# Patient Record
Sex: Female | Born: 1955
Health system: Southern US, Community
[De-identification: ages and names within clinical notes are randomized; demographics above are authoritative.]

## PROBLEM LIST (undated history)

## (undated) DIAGNOSIS — K449 Diaphragmatic hernia without obstruction or gangrene: Secondary | ICD-10-CM

## (undated) DIAGNOSIS — M810 Age-related osteoporosis without current pathological fracture: Secondary | ICD-10-CM

## (undated) DIAGNOSIS — F419 Anxiety disorder, unspecified: Secondary | ICD-10-CM

## (undated) DIAGNOSIS — I341 Nonrheumatic mitral (valve) prolapse: Secondary | ICD-10-CM

## (undated) DIAGNOSIS — K219 Gastro-esophageal reflux disease without esophagitis: Secondary | ICD-10-CM

## (undated) DIAGNOSIS — J45909 Unspecified asthma, uncomplicated: Secondary | ICD-10-CM

## (undated) DIAGNOSIS — E041 Nontoxic single thyroid nodule: Secondary | ICD-10-CM

## (undated) DIAGNOSIS — E785 Hyperlipidemia, unspecified: Secondary | ICD-10-CM

## (undated) DIAGNOSIS — R87619 Unspecified abnormal cytological findings in specimens from cervix uteri: Secondary | ICD-10-CM

## (undated) DIAGNOSIS — K5792 Diverticulitis of intestine, part unspecified, without perforation or abscess without bleeding: Secondary | ICD-10-CM

## (undated) DIAGNOSIS — A6009 Herpesviral infection of other urogenital tract: Secondary | ICD-10-CM

## (undated) DIAGNOSIS — J40 Bronchitis, not specified as acute or chronic: Secondary | ICD-10-CM

## (undated) DIAGNOSIS — L509 Urticaria, unspecified: Secondary | ICD-10-CM

## (undated) HISTORY — DX: Unspecified asthma, uncomplicated: J45.909

## (undated) HISTORY — DX: Diverticulitis of intestine, part unspecified, without perforation or abscess without bleeding: K57.92

## (undated) HISTORY — DX: Herpesviral infection of other urogenital tract: A60.09

## (undated) HISTORY — DX: Anxiety disorder, unspecified: F41.9

## (undated) HISTORY — DX: Nonrheumatic mitral (valve) prolapse: I34.1

## (undated) HISTORY — DX: Diaphragmatic hernia without obstruction or gangrene: K44.9

## (undated) HISTORY — DX: Gastro-esophageal reflux disease without esophagitis: K21.9

## (undated) HISTORY — DX: Bronchitis, not specified as acute or chronic: J40

## (undated) HISTORY — DX: Nontoxic single thyroid nodule: E04.1

## (undated) HISTORY — DX: Hyperlipidemia, unspecified: E78.5

## (undated) HISTORY — DX: Unspecified abnormal cytological findings in specimens from cervix uteri: R87.619

## (undated) HISTORY — DX: Age-related osteoporosis without current pathological fracture: M81.0

## (undated) HISTORY — DX: Urticaria, unspecified: L50.9

---

## 2001-04-28 ENCOUNTER — Other Ambulatory Visit: Admission: RE | Admit: 2001-04-28 | Discharge: 2001-04-28 | Payer: Self-pay | Admitting: Obstetrics and Gynecology

## 2001-06-23 ENCOUNTER — Other Ambulatory Visit: Admission: RE | Admit: 2001-06-23 | Discharge: 2001-06-23 | Payer: Self-pay | Admitting: Dermatology

## 2002-06-11 ENCOUNTER — Other Ambulatory Visit: Admission: RE | Admit: 2002-06-11 | Discharge: 2002-06-11 | Payer: Self-pay | Admitting: Obstetrics and Gynecology

## 2003-08-25 ENCOUNTER — Other Ambulatory Visit: Admission: RE | Admit: 2003-08-25 | Discharge: 2003-08-25 | Payer: Self-pay | Admitting: Obstetrics and Gynecology

## 2004-06-25 HISTORY — PX: CLOSED MANIPULATION SHOULDER: SUR205

## 2004-06-25 LAB — HM DEXA SCAN

## 2004-11-23 HISTORY — PX: LEEP: SHX91

## 2004-12-30 HISTORY — PX: CLOSED MANIPULATION SHOULDER: SUR205

## 2005-06-20 ENCOUNTER — Other Ambulatory Visit: Admission: RE | Admit: 2005-06-20 | Discharge: 2005-06-20 | Payer: Self-pay | Admitting: Obstetrics and Gynecology

## 2008-07-15 ENCOUNTER — Emergency Department (HOSPITAL_BASED_OUTPATIENT_CLINIC_OR_DEPARTMENT_OTHER): Admission: EM | Admit: 2008-07-15 | Discharge: 2008-07-15 | Payer: Self-pay | Admitting: Emergency Medicine

## 2008-07-15 ENCOUNTER — Ambulatory Visit: Payer: Self-pay | Admitting: Diagnostic Radiology

## 2008-10-17 ENCOUNTER — Ambulatory Visit: Payer: Self-pay | Admitting: Diagnostic Radiology

## 2008-10-17 ENCOUNTER — Emergency Department (HOSPITAL_BASED_OUTPATIENT_CLINIC_OR_DEPARTMENT_OTHER): Admission: EM | Admit: 2008-10-17 | Discharge: 2008-10-17 | Payer: Self-pay | Admitting: Emergency Medicine

## 2008-10-29 ENCOUNTER — Ambulatory Visit: Payer: Self-pay | Admitting: Diagnostic Radiology

## 2008-10-29 ENCOUNTER — Emergency Department (HOSPITAL_BASED_OUTPATIENT_CLINIC_OR_DEPARTMENT_OTHER): Admission: EM | Admit: 2008-10-29 | Discharge: 2008-10-29 | Payer: Self-pay | Admitting: Emergency Medicine

## 2009-08-19 ENCOUNTER — Emergency Department (HOSPITAL_BASED_OUTPATIENT_CLINIC_OR_DEPARTMENT_OTHER): Admission: EM | Admit: 2009-08-19 | Discharge: 2009-08-19 | Payer: Self-pay | Admitting: Emergency Medicine

## 2009-08-19 ENCOUNTER — Ambulatory Visit: Payer: Self-pay | Admitting: Diagnostic Radiology

## 2009-10-19 ENCOUNTER — Ambulatory Visit: Payer: Self-pay | Admitting: Obstetrics & Gynecology

## 2009-11-11 ENCOUNTER — Other Ambulatory Visit: Admission: RE | Admit: 2009-11-11 | Discharge: 2009-11-11 | Payer: Self-pay | Admitting: Obstetrics & Gynecology

## 2009-11-11 ENCOUNTER — Ambulatory Visit: Payer: Self-pay | Admitting: Obstetrics & Gynecology

## 2009-11-23 LAB — HM PAP SMEAR: HM Pap smear: ABNORMAL

## 2009-12-02 ENCOUNTER — Ambulatory Visit: Payer: Self-pay | Admitting: Obstetrics & Gynecology

## 2010-03-22 ENCOUNTER — Ambulatory Visit (HOSPITAL_COMMUNITY)
Admission: RE | Admit: 2010-03-22 | Discharge: 2010-03-22 | Payer: Self-pay | Source: Home / Self Care | Admitting: Obstetrics & Gynecology

## 2010-06-02 ENCOUNTER — Ambulatory Visit: Payer: Self-pay | Admitting: Obstetrics & Gynecology

## 2010-06-28 ENCOUNTER — Ambulatory Visit: Admit: 2010-06-28 | Payer: Self-pay | Admitting: Obstetrics & Gynecology

## 2010-07-05 ENCOUNTER — Ambulatory Visit: Admit: 2010-07-05 | Payer: Self-pay | Admitting: Obstetrics & Gynecology

## 2010-07-16 ENCOUNTER — Encounter: Payer: Self-pay | Admitting: *Deleted

## 2010-07-16 ENCOUNTER — Encounter: Payer: Self-pay | Admitting: Obstetrics and Gynecology

## 2010-09-11 LAB — POCT URINALYSIS DIP (DEVICE)
Bilirubin Urine: NEGATIVE
Glucose, UA: NEGATIVE mg/dL
Hgb urine dipstick: NEGATIVE
Ketones, ur: NEGATIVE mg/dL
Nitrite: NEGATIVE
Urobilinogen, UA: 0.2 mg/dL (ref 0.0–1.0)
pH: 5 (ref 5.0–8.0)

## 2010-09-27 ENCOUNTER — Encounter: Payer: Self-pay | Admitting: Family

## 2010-09-27 ENCOUNTER — Ambulatory Visit (INDEPENDENT_AMBULATORY_CARE_PROVIDER_SITE_OTHER): Payer: Self-pay | Admitting: Family

## 2010-09-27 VITALS — BP 100/70 | HR 78 | Temp 98.0°F | Resp 16 | Ht 66.0 in | Wt 120.0 lb

## 2010-09-27 DIAGNOSIS — K219 Gastro-esophageal reflux disease without esophagitis: Secondary | ICD-10-CM

## 2010-09-27 DIAGNOSIS — Z0000 Encounter for general adult medical examination without abnormal findings: Secondary | ICD-10-CM

## 2010-09-27 DIAGNOSIS — J329 Chronic sinusitis, unspecified: Secondary | ICD-10-CM | POA: Insufficient documentation

## 2010-09-27 DIAGNOSIS — F329 Major depressive disorder, single episode, unspecified: Secondary | ICD-10-CM

## 2010-09-27 MED ORDER — CITALOPRAM HYDROBROMIDE 20 MG PO TABS: 20.0000 mg | ORAL_TABLET | Freq: Every day | ORAL | Status: DC

## 2010-09-27 MED ORDER — AMOXICILLIN 875 MG PO TABS: 875.0000 mg | ORAL_TABLET | Freq: Two times a day (BID) | ORAL | Status: AC

## 2010-09-27 MED ORDER — NEOMYCIN-POLYMYXIN-HC 3.5-10000-1 OT SUSP: 3.0000 [drp] | Freq: Three times a day (TID) | OTIC | Status: AC

## 2010-09-27 NOTE — Assessment & Plan Note (Signed)
Will treat with round of amoxicillin.

## 2010-09-27 NOTE — Assessment & Plan Note (Signed)
Suspect that her GI complaints and hoarseness are secondary to GERD.  Will give trial of Prilosec OTC.
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
## 2010-09-27 NOTE — Patient Instructions (Signed)
Take citalopram 1/2 tablet by mouth once daily for the first week.  Increase to a full tablet once daily on week two.   Please complete your blood work tomorrow fasting on the first floor.  Welcome to [facility]!

## 2010-09-27 NOTE — Assessment & Plan Note (Signed)
Pt will be given trial of citalopram.  She was counseled to call if symptoms of worsening depression/suicide ideation occur while she is taking this medication.

## 2010-09-28 LAB — CBC WITH DIFFERENTIAL/PLATELET
Basophils Absolute: 0 10*3/uL (ref 0.0–0.1)
Lymphocytes Relative: 29 % (ref 12–46)
MCV: 90.5 fL (ref 78.0–100.0)
Monocytes Absolute: 0.4 10*3/uL (ref 0.1–1.0)
Neutro Abs: 2.3 10*3/uL (ref 1.7–7.7)
Platelets: 347 10*3/uL (ref 150–400)
RBC: 4.19 MIL/uL (ref 3.87–5.11)
RDW: 12.9 % (ref 11.5–15.5)

## 2010-09-28 LAB — BASIC METABOLIC PANEL
Calcium: 10.1 mg/dL (ref 8.4–10.5)
Glucose, Bld: 84 mg/dL (ref 70–99)
Potassium: 4.7 mEq/L (ref 3.5–5.3)
Sodium: 138 mEq/L (ref 135–145)

## 2010-09-28 LAB — HEPATIC FUNCTION PANEL
ALT: 14 U/L (ref 0–35)
AST: 22 U/L (ref 0–37)
Albumin: 4.9 g/dL (ref 3.5–5.2)
Bilirubin, Direct: 0.1 mg/dL (ref 0.0–0.3)
Total Bilirubin: 0.5 mg/dL (ref 0.3–1.2)

## 2010-10-01 ENCOUNTER — Encounter: Payer: Self-pay | Admitting: Family

## 2010-10-03 LAB — COMPREHENSIVE METABOLIC PANEL
Albumin: 4.4 g/dL (ref 3.5–5.2)
Alkaline Phosphatase: 39 U/L (ref 39–117)
BUN: 18 mg/dL (ref 6–23)
Calcium: 9.7 mg/dL (ref 8.4–10.5)
Chloride: 104 mEq/L (ref 96–112)
Sodium: 142 mEq/L (ref 135–145)
Total Protein: 7.4 g/dL (ref 6.0–8.3)

## 2010-10-03 LAB — URINALYSIS, ROUTINE W REFLEX MICROSCOPIC
Bilirubin Urine: NEGATIVE
Glucose, UA: NEGATIVE mg/dL
Ketones, ur: NEGATIVE mg/dL
Nitrite: NEGATIVE
Urobilinogen, UA: 0.2 mg/dL (ref 0.0–1.0)

## 2010-10-03 LAB — DIFFERENTIAL
Basophils Absolute: 0 10*3/uL (ref 0.0–0.1)
Basophils Relative: 1 % (ref 0–1)
Lymphs Abs: 1.2 10*3/uL (ref 0.7–4.0)
Monocytes Relative: 8 % (ref 3–12)

## 2010-10-03 LAB — CBC
MCV: 92.3 fL (ref 78.0–100.0)
WBC: 5.3 10*3/uL (ref 4.0–10.5)

## 2010-10-04 ENCOUNTER — Telehealth: Payer: Self-pay | Admitting: *Deleted

## 2010-10-04 LAB — URINALYSIS, ROUTINE W REFLEX MICROSCOPIC
Glucose, UA: NEGATIVE mg/dL
Hgb urine dipstick: NEGATIVE
Ketones, ur: NEGATIVE mg/dL
Urobilinogen, UA: 0.2 mg/dL (ref 0.0–1.0)

## 2010-10-04 LAB — PREGNANCY, URINE: Preg Test, Ur: NEGATIVE

## 2010-10-04 LAB — WET PREP, GENITAL: Trich, Wet Prep: NONE SEEN

## 2010-10-04 NOTE — Telephone Encounter (Signed)
Received message from pt stating she would like to schedule a pap smear with [place]. Pt states that her last pap smear was abnormal. Advised pt, per Melissa's recommendation that she should continue pap smears with a GYN until she has a normal pap smear then she may have them done at our office. Pt voices understanding. Pt requested most recent lab results. Gave pt verbal result per lab letter that was mailed on 10/02/10. Advised pt to let me know if she doesn't receive them in the mail by Monday. Advised pt that the lipid panel was not run by the lab. Pt states she will return to the lab fasting tomorrow morning. Order reprinted and faxed to the lab.

## 2010-10-05 ENCOUNTER — Telehealth: Payer: Self-pay | Admitting: *Deleted

## 2010-10-05 ENCOUNTER — Other Ambulatory Visit: Payer: Self-pay | Admitting: Family

## 2010-10-05 NOTE — Telephone Encounter (Signed)
Received call from pt stating that her address has changed to [address] [place], [place] [address] and requested that we resend her lab results to the new address. Pt just had cholesterol drawn today. Will resend previous results letter once the lipid results are final.

## 2010-10-06 ENCOUNTER — Encounter: Payer: Self-pay | Admitting: Family

## 2010-10-06 NOTE — Telephone Encounter (Signed)
Cholesterol results received, letters printed and mailed to pt's new address.

## 2010-10-11 ENCOUNTER — Ambulatory Visit: Payer: Self-pay | Admitting: Family Medicine

## 2010-10-11 ENCOUNTER — Ambulatory Visit: Payer: Self-pay | Admitting: Obstetrics and Gynecology

## 2010-10-11 ENCOUNTER — Other Ambulatory Visit: Payer: Self-pay | Admitting: Family Medicine

## 2010-10-11 DIAGNOSIS — R87612 Low grade squamous intraepithelial lesion on cytologic smear of cervix (LGSIL): Secondary | ICD-10-CM

## 2010-10-11 DIAGNOSIS — A6000 Herpesviral infection of urogenital system, unspecified: Secondary | ICD-10-CM

## 2010-10-12 NOTE — Group Therapy Note (Signed)
NAME:  Kellie [name], ABRAMS NO.:  [id number]  MEDICAL RECORD NO.:  [id number]           PATIENT TYPE:  A  LOCATION:  [facility]                   FACILITY:  [facility]  PHYSICIAN:  Maylon Cos, CNM    DATE OF BIRTH:  06-11-1956  DATE OF SERVICE:  10/11/2010                                 CLINIC NOTE  The patient is being seen in GYN Clinic at [facility].  Reason for today's visit is repeat Pap smear.  HISTORY OF PRESENT ILLNESS:  The patient is a 55 year old Caucasian female who presents for her first Pap smear following a positive __________ for CIN 1 in February 2011 with positive ECC.  She presented today with no complaints other than recurrence of known genital herpes, and she requests a refill on her acyclovir.  PHYSICAL EXAMINATION:  GENERAL:  Aaralynn is a pleasant 55 year old Caucasian female in no apparent distress. HEENT:  Grossly normal. VITAL SIGNS:  Stable.  Her blood pressure is 125/68.  Her weight is 120. Her height is 66-1/2 inches. ABDOMEN:  Soft and nontender. GENITALIA:.  Mucous membranes are atrophied.  Introitus is pale and smooth.  Cervix is parous, nonfriable.  Bimanual exam reveals nonenlarged and nontender uterus, nonenlarged and nontender adnexa. Ovaries are nonpalpable.  ASSESSMENT: 1. Repeat Pap following loop electrosurgical excision procedure for     abnormal Pap smear cervical intraepithelial neoplasia 1.  Plan:     The patient should follow up in 6 months for repeat Pap smear and     annual exam. 2. Known genital herpes.  Plan:  Prescription given for acyclovir 400     mg one p.o. t.i.d. x5 days for recurrence, p.r.n. refills are     given.          ______________________________ Maylon Cos, CNM    SS/MEDQ  D:  10/11/2010  T:  10/12/2010  Job:  [id number]

## 2010-11-08 ENCOUNTER — Ambulatory Visit (INDEPENDENT_AMBULATORY_CARE_PROVIDER_SITE_OTHER): Payer: Self-pay | Admitting: Family

## 2010-11-08 ENCOUNTER — Telehealth: Payer: Self-pay | Admitting: *Deleted

## 2010-11-08 ENCOUNTER — Encounter: Payer: Self-pay | Admitting: Family

## 2010-11-08 ENCOUNTER — Telehealth: Payer: Self-pay | Admitting: Family

## 2010-11-08 ENCOUNTER — Ambulatory Visit (HOSPITAL_BASED_OUTPATIENT_CLINIC_OR_DEPARTMENT_OTHER)
Admission: RE | Admit: 2010-11-08 | Discharge: 2010-11-08 | Disposition: A | Discharge status: Home / Self Care | Payer: Self-pay | Source: Ambulatory Visit | Attending: Family | Admitting: Family

## 2010-11-08 VITALS — BP 106/70 | HR 66 | Temp 98.1°F | Resp 16 | Ht 66.0 in | Wt 118.1 lb

## 2010-11-08 DIAGNOSIS — R110 Nausea: Secondary | ICD-10-CM

## 2010-11-08 DIAGNOSIS — K219 Gastro-esophageal reflux disease without esophagitis: Secondary | ICD-10-CM

## 2010-11-08 DIAGNOSIS — F329 Major depressive disorder, single episode, unspecified: Secondary | ICD-10-CM

## 2010-11-08 DIAGNOSIS — G4700 Insomnia, unspecified: Secondary | ICD-10-CM | POA: Insufficient documentation

## 2010-11-08 DIAGNOSIS — J309 Allergic rhinitis, unspecified: Secondary | ICD-10-CM

## 2010-11-08 DIAGNOSIS — R0789 Other chest pain: Secondary | ICD-10-CM | POA: Insufficient documentation

## 2010-11-08 MED ORDER — ALPRAZOLAM 0.5 MG PO TABS: ORAL_TABLET | ORAL | Status: DC

## 2010-11-08 NOTE — Assessment & Plan Note (Signed)
Trial of prn alprazolam at bedtime.

## 2010-11-08 NOTE — Telephone Encounter (Signed)
Pls call patient and let her know that her ultrasound in normal.  No gallstones.  I would like to refer her to GI for further evaluation of her nausea.

## 2010-11-08 NOTE — Assessment & Plan Note (Signed)
She reports chronic nausea and 1-2 x a week some mild dysphagia.  Will check abdominal ultrasound.  I negative, will refer to GI for further evaluation.
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
## 2010-11-08 NOTE — Telephone Encounter (Signed)
Received message from pt stating she was supposed to have gotten a letter from [place] today at her visit confirming that she has asthma and should avoid smoke. Pt requests that we mail it to her ASAP. Please advise.

## 2010-11-08 NOTE — Assessment & Plan Note (Signed)
Stable, continue citalopram.  

## 2010-11-08 NOTE — Patient Instructions (Signed)
Please complete your ultrasound on the first floor. Start claritin. Follow up in 1 month.

## 2010-11-08 NOTE — Assessment & Plan Note (Signed)
Will add claritin.

## 2010-11-09 ENCOUNTER — Telehealth: Payer: Self-pay | Admitting: *Deleted

## 2010-11-09 NOTE — Telephone Encounter (Signed)
Pt called stating she was contacted by our office re: GI referral and is wanting to wait until she hears from the results of yesterday's tests. Also asking about status of letter re: smoke avoidance. Letter was mailed to her today. Please advise re: test results.

## 2010-11-10 NOTE — Telephone Encounter (Signed)
Pt notified and given appt date / time for GI consultation. She states that she has an appointment conflict for 11/13/10. Office number was given to pt to call and reschedule appt.

## 2010-11-10 NOTE — Telephone Encounter (Signed)
Please see 5/16 phone note.

## 2010-11-10 NOTE — Telephone Encounter (Signed)
Pt advised letter has been mailed.

## 2010-11-13 ENCOUNTER — Ambulatory Visit: Payer: Self-pay | Admitting: Gastroenterology

## 2010-12-04 ENCOUNTER — Telehealth: Payer: Self-pay | Admitting: Family

## 2010-12-04 ENCOUNTER — Ambulatory Visit (INDEPENDENT_AMBULATORY_CARE_PROVIDER_SITE_OTHER): Payer: Self-pay | Admitting: Family

## 2010-12-04 ENCOUNTER — Encounter: Payer: Self-pay | Admitting: Family

## 2010-12-04 ENCOUNTER — Ambulatory Visit (HOSPITAL_BASED_OUTPATIENT_CLINIC_OR_DEPARTMENT_OTHER)
Admission: RE | Admit: 2010-12-04 | Discharge: 2010-12-04 | Disposition: A | Discharge status: Home / Self Care | Payer: Self-pay | Source: Ambulatory Visit | Attending: Family | Admitting: Family

## 2010-12-04 VITALS — BP 138/70 | HR 78 | Temp 98.0°F | Resp 16 | Ht 66.0 in | Wt 118.1 lb

## 2010-12-04 DIAGNOSIS — R059 Cough, unspecified: Secondary | ICD-10-CM | POA: Insufficient documentation

## 2010-12-04 DIAGNOSIS — R05 Cough: Secondary | ICD-10-CM

## 2010-12-04 DIAGNOSIS — J40 Bronchitis, not specified as acute or chronic: Secondary | ICD-10-CM

## 2010-12-04 DIAGNOSIS — R0989 Other specified symptoms and signs involving the circulatory and respiratory systems: Secondary | ICD-10-CM | POA: Insufficient documentation

## 2010-12-04 MED ORDER — MOMETASONE FUROATE 220 MCG/INH IN AEPB: 1.0000 | INHALATION_SPRAY | Freq: Every day | RESPIRATORY_TRACT | Status: DC

## 2010-12-04 MED ORDER — DOXYCYCLINE HYCLATE 100 MG PO CAPS: 100.0000 mg | ORAL_CAPSULE | Freq: Two times a day (BID) | ORAL | Status: DC

## 2010-12-04 MED ORDER — DOXYCYCLINE HYCLATE 100 MG PO CAPS: 100.0000 mg | ORAL_CAPSULE | Freq: Two times a day (BID) | ORAL | Status: AC

## 2010-12-04 NOTE — Telephone Encounter (Signed)
Please call patient and let her know that her chest xray is clear. No pneumonia.

## 2010-12-04 NOTE — Assessment & Plan Note (Signed)
Pt is requesting imaging of her chest.  Will check baseline chest x-ray to exclude Pneumonia.   Plan to treat with doxycycline for bronchitis. Samples of Asmanex provided to patient for use over the next few weeks to help with mild bronchospasm.  Also recommended that she continue to use her pro-air PRN.  Pt is instructed to call if her symptoms worsen, if they do not improve.  Follow up in 1-2 weeks.
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
## 2010-12-04 NOTE — Patient Instructions (Addendum)
Please complete your chest x-ray on the first floor. [place] asmanex for the next 2 weeks, or until your symptoms are resolved. Follow up in 1-2 weeks.

## 2010-12-05 NOTE — Telephone Encounter (Signed)
Pt.notified

## 2011-05-31 ENCOUNTER — Ambulatory Visit (INDEPENDENT_AMBULATORY_CARE_PROVIDER_SITE_OTHER): Payer: Self-pay | Admitting: Internal Medicine

## 2011-05-31 ENCOUNTER — Encounter: Payer: Self-pay | Admitting: Internal Medicine

## 2011-05-31 VITALS — BP 96/60 | HR 86 | Temp 98.3°F | Resp 18 | Wt 125.0 lb

## 2011-05-31 DIAGNOSIS — R0989 Other specified symptoms and signs involving the circulatory and respiratory systems: Secondary | ICD-10-CM

## 2011-05-31 MED ORDER — ALBUTEROL SULFATE HFA 108 (90 BASE) MCG/ACT IN AERS: 2.0000 | INHALATION_SPRAY | Freq: Four times a day (QID) | RESPIRATORY_TRACT | Status: DC | PRN

## 2011-06-02 DIAGNOSIS — R053 Chronic cough: Secondary | ICD-10-CM | POA: Insufficient documentation

## 2011-06-02 DIAGNOSIS — R05 Cough: Secondary | ICD-10-CM | POA: Insufficient documentation

## 2011-06-02 NOTE — Assessment & Plan Note (Signed)
Pt frustrated with chronicity of sx's. Recommend sample of advair bid with mouth rinsing. Pulmonary consult ?PFT. F/u appt made to discuss chronic medical problems

## 2011-06-02 NOTE — Progress Notes (Signed)
  Subjective:    Patient ID: OMAH DEWALT, female    DOB: 1956/03/29, 55 y.o.   MRN: [id number]  HPI Pt worked in for acute visit for lung issues. Mentions a list of approximately 8 chronic problems. Notes chronic lung congestion without cough or hemoptysis. Recalls remote occupational pft that was borderline. In the past attempted advair but not recently. Also s/p allergist. Denies significant gerd sx's.   Past Medical History  Diagnosis Date  . Hyperlipidemia   . Allergic rhinitis 11/08/2010   Past Surgical History  Procedure Date  . Leep 11/2004  . Closed manipulation shoulder 12/2004    reports that she has never smoked. She has never used smokeless tobacco. She reports that she does not drink alcohol or use illicit drugs. family history includes COPD in her mother and sister; Cancer (age of onset:72) in her mother; Heart attack in her brother; and Heart disease in her brother, father, and paternal uncle. Allergies  Allergen Reactions  . Hydrocodone Nausea And Vomiting     Review of Systems see hpi     Objective:   Physical Exam  Nursing note and vitals reviewed. Constitutional: She appears well-developed and well-nourished. No distress.  HENT:  Head: Normocephalic and atraumatic.  Right Ear: External ear normal.  Left Ear: External ear normal.  Nose: Nose normal.  Mouth/Throat: Oropharynx is clear and moist. No oropharyngeal exudate.  Eyes: Conjunctivae are normal. No scleral icterus.  Neck: Neck supple.  Cardiovascular: Normal rate, regular rhythm and normal heart sounds.  Exam reveals no gallop and no friction rub.   No murmur heard. Pulmonary/Chest: Effort normal and breath sounds normal. No respiratory distress. She has no wheezes. She has no rales.  Neurological: She is alert.  Skin: Skin is warm and dry. She is not diaphoretic.  Psychiatric: She has a normal mood and affect.          Assessment & Plan:

## 2011-06-14 ENCOUNTER — Other Ambulatory Visit: Payer: Self-pay | Admitting: Critical Care Medicine

## 2011-06-14 ENCOUNTER — Ambulatory Visit (INDEPENDENT_AMBULATORY_CARE_PROVIDER_SITE_OTHER): Payer: Self-pay | Admitting: Critical Care Medicine

## 2011-06-14 ENCOUNTER — Encounter: Payer: Self-pay | Admitting: Critical Care Medicine

## 2011-06-14 VITALS — BP 102/78 | HR 68 | Temp 98.1°F | Ht 65.0 in | Wt 122.0 lb

## 2011-06-14 DIAGNOSIS — J45909 Unspecified asthma, uncomplicated: Secondary | ICD-10-CM

## 2011-06-14 DIAGNOSIS — R0602 Shortness of breath: Secondary | ICD-10-CM

## 2011-06-14 DIAGNOSIS — J309 Allergic rhinitis, unspecified: Secondary | ICD-10-CM

## 2011-06-14 MED ORDER — FLUTICASONE PROPIONATE 50 MCG/ACT NA SUSP: 2.0000 | Freq: Every day | NASAL | Status: DC

## 2011-06-14 MED ORDER — OMEPRAZOLE 20 MG PO CPDR: 20.0000 mg | DELAYED_RELEASE_CAPSULE | Freq: Every day | ORAL | Status: DC

## 2011-06-14 MED ORDER — CHLORPHENIRAMINE MALEATE CR 8 MG PO CPCR: 8.0000 mg | ORAL_CAPSULE | Freq: Two times a day (BID) | ORAL | Status: DC

## 2011-06-14 MED ORDER — BUDESONIDE 180 MCG/ACT IN AEPB: 2.0000 | INHALATION_SPRAY | Freq: Two times a day (BID) | RESPIRATORY_TRACT | Status: DC

## 2011-06-14 NOTE — Patient Instructions (Addendum)
Labs today Reflux diet Stop fish oil Start omeprazole one daily, 1/2 hour before meals Stop advair Start pulmicort two puff twice daily flonase two puff each nostril daily Start chlorpheniramine one twice daily ( if drowsy on this, can just take at bedtime) Return 1 month

## 2011-06-14 NOTE — Assessment & Plan Note (Addendum)
Moderate persistent asthma with reactive airway disease, potential triggers include: GERD, allergic rhinitis, upper airway instability Note normal CXR and spiro Plan Reflux diet Stop fish oil>>aggravates GERD/cough Start omeprazole one daily, 1/2 hour before meals Stop advair>>irritates upper airway Start pulmicort two puff twice daily flonase two puff each nostril daily Start chlorpheniramine one twice daily ( if drowsy on this, can just take at bedtime) Return 1 month

## 2011-06-14 NOTE — Progress Notes (Signed)
Subjective:    Patient ID: Kellie [name], female    DOB: [birth year], 55 y.o.   MRN: [id number]  HPI Comments: Hx of dyspnea with exertion worse up stairs.  Has been to ED d/t tightness.  Every few months has gone to ED.  Gets severe chest tightness and wheeze.   Triggers:   No heartburn, but does have a lot of gas and belching.  Occ sore throat and is hoarse. Notes pndrip.  Nose is always sl congested.  Notes some ear pain and irritation.    Notes nocturnal dyspnea.  Notes some cough but is not a major issue.   No real mucus production. Now on advair for two weeks,  ? If has helped.  No GERD meds consistently. After eats, food will hang up in esophagus.  Occ regurgitation.     Shortness of Breath This is a recurrent problem. The current episode started more than 1 year ago. The problem occurs daily (exertional). The problem has been unchanged. Associated symptoms include leg swelling, rhinorrhea, a sore throat, sputum production and wheezing. Pertinent negatives include no abdominal pain, chest pain, claudication, coryza, ear pain, fever, headaches, hemoptysis, leg pain, neck pain, orthopnea, PND, rash, swollen glands, syncope or vomiting. The symptoms are aggravated by eating, any activity, lying flat, exercise, emotional upset, odors, fumes, pollens and URIs. She has tried beta agonist inhalers and steroid inhalers for the symptoms. The treatment provided no relief. There is no history of aspirin allergies, asthma, bronchiolitis, CAD, chronic lung disease, COPD, DVT, a heart failure, PE, pneumonia or a recent surgery.      Review of Systems  Constitutional: Positive for chills and activity change. Negative for fever, diaphoresis, appetite change, fatigue and unexpected weight change.  HENT: Positive for nosebleeds, congestion, sore throat, rhinorrhea, sneezing, dental problem, voice change, postnasal drip, sinus pressure and ear discharge. Negative for hearing loss, ear pain, facial swelling,  mouth sores, trouble swallowing, neck pain, neck stiffness and tinnitus.   Eyes: Positive for itching and visual disturbance. Negative for photophobia and discharge.  Respiratory: Positive for cough, sputum production, shortness of breath and wheezing. Negative for apnea, hemoptysis, choking, chest tightness and stridor.   Cardiovascular: Positive for palpitations and leg swelling. Negative for chest pain, orthopnea, claudication, syncope and PND.  Gastrointestinal: Positive for constipation. Negative for nausea, vomiting, abdominal pain, blood in stool and abdominal distention.  Genitourinary: Negative for dysuria, urgency, frequency, hematuria, flank pain, decreased urine volume and difficulty urinating.  Musculoskeletal: Positive for back pain and joint swelling. Negative for myalgias, arthralgias and gait problem.  Skin: Negative for color change, pallor and rash.  Neurological: Positive for dizziness, tremors, speech difficulty and light-headedness. Negative for seizures, syncope, weakness, numbness and headaches.  Hematological: Negative for adenopathy. Bruises/bleeds easily.  Psychiatric/Behavioral: Positive for sleep disturbance and agitation. Negative for confusion. The patient is not nervous/anxious.        Objective:   Physical Exam Filed Vitals:   06/14/11 0944  BP: 102/78  Pulse: 68  Temp: 98.1 F (36.7 C)  TempSrc: Oral  Height: 5\' 5"  (1.651 m)  Weight: 122 lb (55.339 kg)  SpO2: 97%    Gen: Pleasant, well-nourished, in no distress,  normal affect  ENT: No lesions,  mouth clear,  oropharynx clear, ++ postnasal drip  Neck: No JVD, no TMG, no carotid bruits  Lungs: No use of accessory muscles, no dullness to percussion, exp wheeze  Cardiovascular: RRR, heart sounds normal, no murmur or gallops, no peripheral edema  Abdomen:  soft and NT, no HSM,  BS normal  Musculoskeletal: No deformities, no cyanosis or clubbing  Neuro: alert, non focal  Skin: Warm, no lesions  or rashes  CXR 10/12: no active disease Spiro: 12/20:  normal        Assessment & Plan:   Asthma with allergic rhinitis Moderate persistent asthma with reactive airway disease, potential triggers include: GERD, allergic rhinitis, upper airway instability Note normal CXR and spiro Plan Reflux diet Stop fish oil>>aggravates GERD/cough Start omeprazole one daily, 1/2 hour before meals Stop advair>>irritates upper airway Start pulmicort two puff twice daily flonase two puff each nostril daily Start chlorpheniramine one twice daily ( if drowsy on this, can just take at bedtime) Return 1 month     Updated Medication List Outpatient Encounter Prescriptions as of 06/14/2011  Medication Sig Dispense Refill  . MAGNESIUM PO Take by mouth daily.        [name] Kitchen OVER THE COUNTER MEDICATION Take by mouth daily. AF BETA FOOD       . DISCONTD: albuterol (PROAIR HFA) 108 (90 BASE) MCG/ACT inhaler Inhale 2 puffs into the lungs every 6 (six) hours as needed.  1 Inhaler  1  . DISCONTD: Fluticasone-Salmeterol (ADVAIR DISKUS) 100-50 MCG/DOSE AEPB Inhale 1 puff into the lungs 2 (two) times daily.        [name] Kitchen DISCONTD: Omega-3 Fatty Acids (FISH OIL PO) Take by mouth daily.        . budesonide (PULMICORT FLEXHALER) 180 MCG/ACT inhaler Inhale 2 puffs into the lungs 2 (two) times daily.  1 each  5  . chlorpheniramine (CHLOR-TRIMETON) 8 MG capsule Take 1 capsule (8 mg total) by mouth 2 (two) times daily.  90 capsule  6  . fluticasone (FLONASE) 50 MCG/ACT nasal spray Place 2 sprays into the nose daily.  16 g  6  . omeprazole (PRILOSEC) 20 MG capsule Take 1 capsule (20 mg total) by mouth daily.  30 capsule  4

## 2011-06-15 LAB — ALLERGY FULL PROFILE
Allergen,Goose feathers, e70: <0.1 kU/L (ref <0.35)
Alternaria Alternata: <0.1 kU/L (ref <0.35)
Bahia Grass: <0.1 kU/L (ref <0.35)
Bermuda Grass: <0.1 kU/L (ref <0.35)
Box Elder IgE: <0.1 kU/L (ref <0.35)
Candida Albicans: <0.1 kU/L (ref <0.35)
Cat Dander: <0.1 kU/L (ref <0.35)
Curvularia lunata: <0.1 kU/L (ref <0.35)
Dog Dander: <0.1 kU/L (ref <0.35)
Fescue: <0.1 kU/L (ref <0.35)
G005 Rye, Perennial: <0.1 kU/L (ref <0.35)
Goldenrod: <0.1 kU/L (ref <0.35)
Helminthosporium halodes: <0.1 kU/L (ref <0.35)
IgE (Immunoglobulin E), Serum: 7.1 IU/mL (ref 0.0–180.0)
Lamb's Quarters: <0.1 kU/L (ref <0.35)
Stemphylium Botryosum: <0.1 kU/L (ref <0.35)
Sycamore Tree: <0.1 kU/L (ref <0.35)

## 2011-06-15 NOTE — Progress Notes (Signed)
Quick Note:  Call pt and tell her labs are ok, No change in medications Allergy test is negative for any severe allergies ______

## 2011-06-29 ENCOUNTER — Encounter: Payer: Self-pay | Admitting: Obstetrics & Gynecology

## 2011-06-29 ENCOUNTER — Ambulatory Visit (INDEPENDENT_AMBULATORY_CARE_PROVIDER_SITE_OTHER): Payer: Self-pay | Admitting: Obstetrics & Gynecology

## 2011-06-29 VITALS — BP 115/73 | HR 74 | Temp 97.0°F | Ht 65.0 in | Wt 122.5 lb

## 2011-06-29 DIAGNOSIS — Z0000 Encounter for general adult medical examination without abnormal findings: Secondary | ICD-10-CM

## 2011-06-29 DIAGNOSIS — Z01419 Encounter for gynecological examination (general) (routine) without abnormal findings: Secondary | ICD-10-CM

## 2011-06-29 NOTE — Progress Notes (Signed)
Subjective:    Kellie [name] is a 56 y.o. female who presents for an annual exam. The patient has no complaints today. The patient is not currently sexually active. GYN screening history: last pap: was normal. The patient wears seatbelts: yes. The patient participates in regular exercise: yes. Has the patient ever been transfused or tattooed?: yes. The patient reports that there is not domestic violence in her life. She lives alone.  Menstrual History: OB History    Grav Para Term Preterm Abortions TAB SAB Ect Mult Living   1 1 1       1       Menarche age: 64 No LMP recorded. Patient is postmenopausal.    The following portions of the patient's history were reviewed and updated as appropriate: allergies, current medications, past family history, past medical history, past social history, past surgical history and problem list.  Review of Systems A comprehensive review of systems was negative. She does a side business of cleaning. She is divorced and abstinent for the last 2 years. She has hot flashes and her LMP was at age 47. She has a 57 yo son and 19 yo grandson.   Objective:    BP 115/73  Pulse 74  Temp 97 F (36.1 C)  Ht 5\' 5"  (1.651 m)  Wt 122 lb 8 oz (55.566 kg)  BMI 20.39 kg/m2  General Appearance:    Alert, cooperative, no distress, appears stated age  Head:    Normocephalic, without obvious abnormality, atraumatic  Eyes:    PERRL, conjunctiva/corneas clear, EOM's intact, fundi    benign, both eyes  Ears:    Normal TM's and external ear canals, both ears  Nose:   Nares normal, septum midline, mucosa normal, no drainage    or sinus tenderness  Throat:   Lips, mucosa, and tongue normal; teeth and gums normal  Neck:   Supple, symmetrical, trachea midline, no adenopathy;    thyroid:  no enlargement/tenderness/nodules; no carotid   bruit or JVD  Back:     Symmetric, no curvature, ROM normal, no CVA tenderness  Lungs:     Clear to auscultation bilaterally, respirations  unlabored  Chest Wall:    No tenderness or deformity   Heart:    Regular rate and rhythm, S1 and S2 normal, no murmur, rub   or gallop  Breast Exam:    No tenderness, masses, or nipple abnormality  Abdomen:     Soft, non-tender, bowel sounds active all four quadrants,    no masses, no organomegaly  Genitalia:    Normal female without lesion, discharge or tenderness, moderate atrophy, cervical stenosis, NSSA, NT, no adnexal masses     Extremities:   Extremities normal, atraumatic, no cyanosis or edema  Pulses:   2+ and symmetric all extremities  Skin:   Skin color, texture, turgor normal, no rashes or lesions  Lymph nodes:   Cervical, supraclavicular, and axillary nodes normal  Neurologic:   CNII-XII intact, normal strength, sensation and reflexes    throughout  .    Assessment:    Healthy female exam.    Plan:     Mammogram. Pap smear.  Rec SBE/SVE

## 2011-07-04 ENCOUNTER — Other Ambulatory Visit: Payer: Self-pay | Admitting: Obstetrics & Gynecology

## 2011-07-04 ENCOUNTER — Other Ambulatory Visit: Payer: Self-pay | Admitting: Family

## 2011-07-04 DIAGNOSIS — Z1231 Encounter for screening mammogram for malignant neoplasm of breast: Secondary | ICD-10-CM

## 2011-07-19 ENCOUNTER — Ambulatory Visit (INDEPENDENT_AMBULATORY_CARE_PROVIDER_SITE_OTHER): Payer: Self-pay | Admitting: Critical Care Medicine

## 2011-07-19 ENCOUNTER — Encounter: Payer: Self-pay | Admitting: Critical Care Medicine

## 2011-07-19 VITALS — BP 96/68 | HR 79 | Temp 98.1°F | Ht 66.0 in | Wt 122.0 lb

## 2011-07-19 DIAGNOSIS — J45909 Unspecified asthma, uncomplicated: Secondary | ICD-10-CM

## 2011-07-19 DIAGNOSIS — J329 Chronic sinusitis, unspecified: Secondary | ICD-10-CM

## 2011-07-19 MED ORDER — AMOXICILLIN 500 MG PO CAPS: 500.0000 mg | ORAL_CAPSULE | Freq: Two times a day (BID) | ORAL | Status: DC

## 2011-07-19 MED ORDER — DOXYCYCLINE HYCLATE 100 MG PO TABS: 100.0000 mg | ORAL_TABLET | Freq: Two times a day (BID) | ORAL | Status: AC

## 2011-07-19 MED ORDER — AMOXICILLIN-POT CLAVULANATE 875-125 MG PO TABS: 1.0000 | ORAL_TABLET | Freq: Two times a day (BID) | ORAL | Status: DC

## 2011-07-19 MED ORDER — BECLOMETHASONE DIPROPIONATE 80 MCG/ACT NA AERS: 2.0000 | INHALATION_SPRAY | Freq: Every day | NASAL | Status: DC
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
## 2011-07-19 NOTE — Patient Instructions (Signed)
Obtain CT scan of sinuses--->I will call with results Start augmentin one twice daily Stop omeprazole when current bottle runs out Stop pulmicort Stay on reflux diet Stay off fish oil Stop flonase Start Qnasl two puff each nostril daily Return 1 month

## 2011-07-20 NOTE — Assessment & Plan Note (Addendum)
Cyclical Cough with upper airway instability and poss chronic sinusitis as ppt factor. Note sinusitis in 2010 Doubt true asthma with failure to respond to ICS  Plan Obtain CT scan of sinuses--->I will call with results. Start augmentin one twice daily Stop omeprazole when current bottle runs out Stop pulmicort Stay on reflux diet Stay off fish oil Stop flonase Start Qnasl two puff each nostril daily Return 1 month

## 2011-07-23 ENCOUNTER — Ambulatory Visit (HOSPITAL_BASED_OUTPATIENT_CLINIC_OR_DEPARTMENT_OTHER)
Admission: RE | Admit: 2011-07-23 | Discharge: 2011-07-23 | Disposition: A | Discharge status: Home / Self Care | Payer: Self-pay | Source: Ambulatory Visit | Attending: Critical Care Medicine | Admitting: Critical Care Medicine

## 2011-07-23 DIAGNOSIS — J322 Chronic ethmoidal sinusitis: Secondary | ICD-10-CM

## 2011-07-23 DIAGNOSIS — J329 Chronic sinusitis, unspecified: Secondary | ICD-10-CM | POA: Insufficient documentation

## 2011-07-25 ENCOUNTER — Telehealth: Payer: Self-pay | Admitting: Critical Care Medicine

## 2011-07-25 ENCOUNTER — Encounter: Payer: Self-pay | Admitting: *Deleted

## 2011-07-25 NOTE — Telephone Encounter (Signed)
I spoke with pt and read her letter. She stated this was "perfect". Pt wanted a copy mailed to her for her records. I confirmed mailing address. I have placed copy in the mail. I also faxed this over to the fax #. Nothing further was needed

## 2011-07-25 NOTE — Telephone Encounter (Signed)
Patient calling back.  She gave me fax # [phone]--attn: Olegario Messier.  Patient states she is wanting to know what the letter said and would also like a copy mailed to her for her records.

## 2011-07-25 NOTE — Telephone Encounter (Signed)
Letter signed by Dr. Delford Field.  I called pt at # listed above - [email or web address] to see if she has the fax # yet.  The letter is in triage on the board.

## 2011-07-25 NOTE — Telephone Encounter (Signed)
Letter completed and placed in PW's to do for signature.

## 2011-07-25 NOTE — Telephone Encounter (Signed)
i am ok with this 

## 2011-07-25 NOTE — Telephone Encounter (Signed)
Dr. Delford Field, please advise if okay to compose a letter for the pt stating that due to asthma she should be moved to an apt that would not require her to walk up stairs, thanks!

## 2011-08-01 ENCOUNTER — Ambulatory Visit (HOSPITAL_COMMUNITY)
Admission: RE | Admit: 2011-08-01 | Discharge: 2011-08-01 | Disposition: A | Discharge status: Home / Self Care | Payer: Self-pay | Source: Ambulatory Visit | Attending: Family | Admitting: Family

## 2011-08-01 DIAGNOSIS — Z1231 Encounter for screening mammogram for malignant neoplasm of breast: Secondary | ICD-10-CM

## 2011-08-23 ENCOUNTER — Ambulatory Visit: Payer: Self-pay | Admitting: Critical Care Medicine

## 2011-10-16 ENCOUNTER — Ambulatory Visit (INDEPENDENT_AMBULATORY_CARE_PROVIDER_SITE_OTHER): Payer: Self-pay | Admitting: Family

## 2011-10-16 ENCOUNTER — Encounter: Payer: Self-pay | Admitting: Gastroenterology

## 2011-10-16 ENCOUNTER — Encounter: Payer: Self-pay | Admitting: Family

## 2011-10-16 DIAGNOSIS — Z0000 Encounter for general adult medical examination without abnormal findings: Secondary | ICD-10-CM

## 2011-10-16 DIAGNOSIS — Z136 Encounter for screening for cardiovascular disorders: Secondary | ICD-10-CM

## 2011-10-16 NOTE — Patient Instructions (Signed)
Please return to the lab fasting tomorrow for lab work.  You should be contacted about your referral to Gastroenterology. Schedule your bone density at the front desk. Follow up in 1 year, sooner if problems or concerns.
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
## 2011-10-17 LAB — CBC WITH DIFFERENTIAL/PLATELET
Eosinophils Relative: 2 % (ref 0–5)
Lymphocytes Relative: 30 % (ref 12–46)
Lymphs Abs: 1.3 10*3/uL (ref 0.7–4.0)
MCV: 92.9 fL (ref 78.0–100.0)
Neutro Abs: 2.5 10*3/uL (ref 1.7–7.7)
Neutrophils Relative %: 57 % (ref 43–77)
Platelets: 351 10*3/uL (ref 150–400)
RBC: 4.21 MIL/uL (ref 3.87–5.11)
WBC: 4.3 10*3/uL (ref 4.0–10.5)

## 2011-10-17 LAB — BASIC METABOLIC PANEL WITH GFR
CO2: 28 mEq/L (ref 19–32)
Calcium: 9.4 mg/dL (ref 8.4–10.5)
Creat: 0.78 mg/dL (ref 0.50–1.10)
Glucose, Bld: 90 mg/dL (ref 70–99)

## 2011-10-17 LAB — HEPATIC FUNCTION PANEL
ALT: 16 U/L (ref 0–35)
AST: 20 U/L (ref 0–37)
Bilirubin, Direct: 0.1 mg/dL (ref 0.0–0.3)

## 2011-10-17 LAB — LIPID PANEL
LDL Cholesterol: 122 mg/dL — ABNORMAL HIGH (ref 0–99)
Total CHOL/HDL Ratio: 2.3 Ratio

## 2011-10-17 LAB — TSH: TSH: 2.268 u[IU]/mL (ref 0.350–4.500)

## 2011-10-18 DIAGNOSIS — Z0000 Encounter for general adult medical examination without abnormal findings: Secondary | ICD-10-CM | POA: Insufficient documentation

## 2011-10-18 LAB — URINALYSIS, ROUTINE W REFLEX MICROSCOPIC
Leukocytes, UA: NEGATIVE
Nitrite: NEGATIVE
Specific Gravity, Urine: 1.013 (ref 1.005–1.030)
Urobilinogen, UA: 0.2 mg/dL (ref 0.0–1.0)
pH: 5.5 (ref 5.0–8.0)

## 2011-10-18 NOTE — Assessment & Plan Note (Signed)
Pt encouraged to continue healthy eating and exercise. Tetanus is up to date. Pap/mammo up to date.  Order colo and dexa.

## 2011-10-22 ENCOUNTER — Telehealth: Payer: Self-pay | Admitting: *Deleted

## 2011-10-22 NOTE — Telephone Encounter (Signed)
Kidney, sugar, cholesterol, liver, thyroid- normal.  Cholesterol slightly elevated.  She should work on a low fat/low cholesterol diet.  OK to mail labs.  I did release them to mychart last week as well.

## 2011-10-22 NOTE — Telephone Encounter (Signed)
Pt left message requesting lab results and copy to be mailed to her home. Please verify pt's address before mailing results.

## 2011-10-22 NOTE — Telephone Encounter (Signed)
Notified pt and mailed diet and labs.

## 2011-11-01 ENCOUNTER — Telehealth: Payer: Self-pay | Admitting: *Deleted

## 2011-11-01 NOTE — Telephone Encounter (Signed)
Received call from pt that she has not received results yet. Verified address, mailed diet and labs again. Pt aware.

## 2011-11-06 ENCOUNTER — Other Ambulatory Visit: Payer: Self-pay | Admitting: *Deleted

## 2011-11-06 NOTE — Telephone Encounter (Signed)
Pt reported stable mood back in April off of celexa.  If her symptoms have worsened and she feels that she needs to restart celexa, then I would like to see her back in the office please.

## 2011-11-06 NOTE — Telephone Encounter (Signed)
Left detailed message on cell re: instructions below and to call if any questions. 

## 2011-11-06 NOTE — Telephone Encounter (Signed)
Received message from pt requesting refill of Celexa.  Looks like last rx was sent in April 2012 #30 x 2 refills. Med was removed from med list in December as pt stated it was no longer needed. Please advise.

## 2011-11-07 ENCOUNTER — Ambulatory Visit (INDEPENDENT_AMBULATORY_CARE_PROVIDER_SITE_OTHER)
Admission: RE | Admit: 2011-11-07 | Discharge: 2011-11-07 | Disposition: A | Discharge status: Home / Self Care | Payer: Self-pay | Source: Ambulatory Visit

## 2011-11-07 ENCOUNTER — Encounter: Payer: Self-pay | Admitting: Gastroenterology

## 2011-11-07 ENCOUNTER — Ambulatory Visit (INDEPENDENT_AMBULATORY_CARE_PROVIDER_SITE_OTHER): Payer: Self-pay | Admitting: Gastroenterology

## 2011-11-07 VITALS — BP 86/50 | HR 68 | Ht 66.0 in | Wt 124.0 lb

## 2011-11-07 DIAGNOSIS — M810 Age-related osteoporosis without current pathological fracture: Secondary | ICD-10-CM

## 2011-11-07 DIAGNOSIS — K219 Gastro-esophageal reflux disease without esophagitis: Secondary | ICD-10-CM

## 2011-11-07 DIAGNOSIS — R6889 Other general symptoms and signs: Secondary | ICD-10-CM

## 2011-11-07 DIAGNOSIS — Z1211 Encounter for screening for malignant neoplasm of colon: Secondary | ICD-10-CM

## 2011-11-07 MED ORDER — ESOMEPRAZOLE MAGNESIUM 40 MG PO PACK: 40.0000 mg | PACK | Freq: Every day | ORAL | Status: DC

## 2011-11-07 MED ORDER — MOVIPREP 100 G PO SOLR: 1.0000 | ORAL | Status: DC

## 2011-11-07 NOTE — Patient Instructions (Addendum)
Samples of PPI given, take one pill 20-30 min before dinner meal every night. You will be set up for an upper endoscopy for GERD symptoms. You will be set up for a colonoscopy for routine screening. A copy of this information will be made available to [facility]. ENT referral mouth, throat fullness.    [facility] [address] [place], [place] [address] [phone] phone [phone] fax 9:00a - 5:00p M - F  $100 will need to be paid the day of the procedure [phone]  Please call to schedule the appointment
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
## 2011-11-21 ENCOUNTER — Ambulatory Visit (INDEPENDENT_AMBULATORY_CARE_PROVIDER_SITE_OTHER): Payer: Self-pay | Admitting: Family

## 2011-11-21 ENCOUNTER — Encounter: Payer: Self-pay | Admitting: Family

## 2011-11-21 VITALS — BP 82/54 | HR 72 | Temp 97.6°F | Resp 16 | Wt 124.0 lb

## 2011-11-21 DIAGNOSIS — I951 Orthostatic hypotension: Secondary | ICD-10-CM

## 2011-11-21 DIAGNOSIS — M949 Disorder of cartilage, unspecified: Secondary | ICD-10-CM

## 2011-11-21 DIAGNOSIS — M8580 Other specified disorders of bone density and structure, unspecified site: Secondary | ICD-10-CM

## 2011-11-21 DIAGNOSIS — M810 Age-related osteoporosis without current pathological fracture: Secondary | ICD-10-CM | POA: Insufficient documentation

## 2011-11-21 DIAGNOSIS — M25569 Pain in unspecified knee: Secondary | ICD-10-CM

## 2011-11-21 DIAGNOSIS — R42 Dizziness and giddiness: Secondary | ICD-10-CM

## 2011-11-21 DIAGNOSIS — M545 Low back pain: Secondary | ICD-10-CM

## 2011-11-21 DIAGNOSIS — F329 Major depressive disorder, single episode, unspecified: Secondary | ICD-10-CM

## 2011-11-21 HISTORY — DX: Age-related osteoporosis without current pathological fracture: M81.0

## 2011-11-21 MED ORDER — CITALOPRAM HYDROBROMIDE 20 MG PO TABS: 20.0000 mg | ORAL_TABLET | Freq: Every day | ORAL | Status: DC

## 2011-11-21 NOTE — Assessment & Plan Note (Signed)
Pt with postural dizziness.  No significant BP drop with standing, but her HR does rise significantly.  Will check BMET and cortisol level (to eval for adrenal insufficiency).  Recommended adequate water intake (8 glasses a day).

## 2011-11-21 NOTE — Assessment & Plan Note (Signed)
She declines referral or further eval at this time.  Recommended tylenol prn.

## 2011-11-21 NOTE — Assessment & Plan Note (Signed)
Sacral region.  Tylenol prn.  Denies injury.
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
## 2011-11-21 NOTE — Assessment & Plan Note (Signed)
Add caltrate bid.  Check vitamin D level.

## 2011-11-21 NOTE — Patient Instructions (Signed)
Pls complete your blood work prior to leaving.  Make sure you are drinking 8 glasses of water a day. Follow up in 2 weeks.

## 2011-11-21 NOTE — Assessment & Plan Note (Signed)
Deteriorated.  Resume citalopram.   

## 2011-11-22 LAB — BASIC METABOLIC PANEL
CO2: 28 mEq/L (ref 19–32)
Chloride: 103 mEq/L (ref 96–112)
Potassium: 4.5 mEq/L (ref 3.5–5.3)

## 2011-11-22 LAB — VITAMIN D 25 HYDROXY (VIT D DEFICIENCY, FRACTURES): Vit D, 25-Hydroxy: 36 ng/mL (ref 30–89)

## 2011-11-23 ENCOUNTER — Encounter: Payer: Self-pay | Admitting: Family

## 2011-11-28 ENCOUNTER — Encounter: Payer: Self-pay | Admitting: Family

## 2011-11-28 ENCOUNTER — Encounter: Payer: Self-pay | Admitting: Gastroenterology

## 2011-11-28 ENCOUNTER — Telehealth: Payer: Self-pay | Admitting: Family

## 2011-11-28 DIAGNOSIS — M810 Age-related osteoporosis without current pathological fracture: Secondary | ICD-10-CM

## 2011-11-28 MED ORDER — ALENDRONATE SODIUM 70 MG PO TABS: 70.0000 mg | ORAL_TABLET | ORAL | Status: DC

## 2011-11-28 NOTE — Telephone Encounter (Signed)
Pls call pt and let her know that her bone density shows osteoporosis.  She should add caltrate 600mg  + D bid and start fosamax once weekly to help her bones.  Continue regular exercise.

## 2011-11-28 NOTE — Telephone Encounter (Signed)
See letter dated 5/31 for details. Do we have her proper address?

## 2011-11-28 NOTE — Telephone Encounter (Signed)
Call placed to patient at [phone], she was informed of test results per Sandford Craze instructions. She stated she is aware of her Bone Density results and she would like to know what the findings are for the blood test done last week on for her blood pressure,electrolytes and hormone levels.

## 2011-11-29 NOTE — Telephone Encounter (Signed)
Call was returned to patient at [phone], she was informed of lab results as stated in lab letter. Patient has verified address on file is correct. She would like to know, what could be to cause of her low blood pressure.

## 2011-11-30 NOTE — Telephone Encounter (Signed)
Dehydration could be cause of low BP.  Will repeat at her 2 week follow up.

## 2011-11-30 NOTE — Telephone Encounter (Signed)
Spoke with pt.  Questions answered.  She would like details of dexa results.  Unable to open pacs system.  Will access on Monday.

## 2011-12-03 NOTE — Telephone Encounter (Signed)
Please call pt and let her know that the right hip shows osteoporosis, Left hip and spine show osteopenia.

## 2011-12-03 NOTE — Telephone Encounter (Signed)
Notified pt, mailed result.

## 2012-02-15 ENCOUNTER — Encounter: Payer: Self-pay | Admitting: Family

## 2012-02-15 ENCOUNTER — Ambulatory Visit (INDEPENDENT_AMBULATORY_CARE_PROVIDER_SITE_OTHER): Payer: Self-pay | Admitting: Family

## 2012-02-15 VITALS — BP 100/62 | HR 73 | Temp 98.4°F | Ht 66.0 in | Wt 124.0 lb

## 2012-02-15 DIAGNOSIS — J309 Allergic rhinitis, unspecified: Secondary | ICD-10-CM

## 2012-02-15 DIAGNOSIS — R4183 Borderline intellectual functioning: Secondary | ICD-10-CM | POA: Insufficient documentation

## 2012-02-15 DIAGNOSIS — F411 Generalized anxiety disorder: Secondary | ICD-10-CM

## 2012-02-15 DIAGNOSIS — F419 Anxiety disorder, unspecified: Secondary | ICD-10-CM

## 2012-02-15 MED ORDER — FLUTICASONE PROPIONATE 50 MCG/ACT NA SUSP: 2.0000 | Freq: Every day | NASAL | Status: DC
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
## 2012-02-15 NOTE — Patient Instructions (Addendum)
You will be contact about your referral to ENT and the therapist.  Please let us know if you have not heard back within 1 week about your referral.

## 2012-02-15 NOTE — Assessment & Plan Note (Signed)
Refer to behavioral health for counseling to discuss her issues with trust.

## 2012-02-15 NOTE — Assessment & Plan Note (Signed)
Add flonase, continue claritin.  Attempt to find ENT who accepts [facility] insurance at pt request.

## 2012-03-04 ENCOUNTER — Emergency Department (HOSPITAL_BASED_OUTPATIENT_CLINIC_OR_DEPARTMENT_OTHER): Payer: Self-pay

## 2012-03-04 ENCOUNTER — Encounter (HOSPITAL_BASED_OUTPATIENT_CLINIC_OR_DEPARTMENT_OTHER): Payer: Self-pay | Admitting: *Deleted

## 2012-03-04 ENCOUNTER — Emergency Department (HOSPITAL_BASED_OUTPATIENT_CLINIC_OR_DEPARTMENT_OTHER)
Admission: EM | Admit: 2012-03-04 | Discharge: 2012-03-04 | Disposition: A | Discharge status: Home / Self Care | Payer: Self-pay | Attending: Emergency Medicine | Admitting: Emergency Medicine

## 2012-03-04 DIAGNOSIS — Z8489 Family history of other specified conditions: Secondary | ICD-10-CM | POA: Insufficient documentation

## 2012-03-04 DIAGNOSIS — X58XXXA Exposure to other specified factors, initial encounter: Secondary | ICD-10-CM | POA: Insufficient documentation

## 2012-03-04 DIAGNOSIS — Z8249 Family history of ischemic heart disease and other diseases of the circulatory system: Secondary | ICD-10-CM | POA: Insufficient documentation

## 2012-03-04 DIAGNOSIS — Z800 Family history of malignant neoplasm of digestive organs: Secondary | ICD-10-CM | POA: Insufficient documentation

## 2012-03-04 DIAGNOSIS — R0789 Other chest pain: Secondary | ICD-10-CM

## 2012-03-04 DIAGNOSIS — T148XXA Other injury of unspecified body region, initial encounter: Secondary | ICD-10-CM

## 2012-03-04 DIAGNOSIS — R071 Chest pain on breathing: Secondary | ICD-10-CM | POA: Insufficient documentation

## 2012-03-04 DIAGNOSIS — Z8051 Family history of malignant neoplasm of kidney: Secondary | ICD-10-CM | POA: Insufficient documentation

## 2012-03-04 DIAGNOSIS — Z833 Family history of diabetes mellitus: Secondary | ICD-10-CM | POA: Insufficient documentation

## 2012-03-04 DIAGNOSIS — Z885 Allergy status to narcotic agent status: Secondary | ICD-10-CM | POA: Insufficient documentation

## 2012-03-04 LAB — D-DIMER, QUANTITATIVE: D-Dimer, Quant: <0.27 ug/mL-FEU (ref 0.00–0.48)

## 2012-03-04 MED ORDER — NAPROXEN 500 MG PO TABS: 500.0000 mg | ORAL_TABLET | Freq: Two times a day (BID) | ORAL | Status: DC

## 2012-03-04 MED ORDER — METHOCARBAMOL 500 MG PO TABS: 500.0000 mg | ORAL_TABLET | Freq: Two times a day (BID) | ORAL | Status: DC

## 2012-03-04 MED ORDER — METHOCARBAMOL 500 MG PO TABS: 500.0000 mg | ORAL_TABLET | Freq: Two times a day (BID) | ORAL | Status: AC

## 2012-03-04 NOTE — ED Notes (Signed)
Right side chest pain started in right side of upper back radiates through to the right side of mid chest states pain is much worse with deep breath and movement

## 2012-03-04 NOTE — ED Provider Notes (Signed)
History     CSN: [id number]  Arrival date & time 03/04/12  4098   First MD Initiated Contact with Patient 03/04/12 [phone]      Chief Complaint  Patient presents with  . Chest Pain    right side    (Consider location/radiation/quality/duration/timing/severity/associated sxs/prior treatment) Patient is a 56 y.o. female presenting with chest pain. The history is provided by the patient and a friend.  Chest Pain    patient here with right-sided chest pain that started her back which is described as being like a muscle pull. No associated dyspnea, diaphoresis or exertional component to it. No recent fever or cough. Took ibuprofen with significant relief of her symptoms. No prior history of same. Denies any rashes to the skin. Denies any lower extremity swelling or recent travel history. Does note that recently she did lift something heavy. Denies any numbness in her arm.  Past Medical History  Diagnosis Date  . Hyperlipidemia   . Allergic rhinitis 11/08/2010  . Osteoporosis 11/21/2011    Past Surgical History  Procedure Date  . Leep 11/2004  . Closed manipulation shoulder 12/2004    Family History  Problem Relation Age of Onset  . Kidney cancer Mother 69  . COPD Mother   . Heart disease Father   . COPD Sister   . Heart disease Brother   . Heart attack Brother   . Heart disease Paternal Uncle   . Colon cancer Neg Hx   . Diabetes      neice  . Thyroid disease      neice    History  Substance Use Topics  . Smoking status: Never Smoker   . Smokeless tobacco: Never Used  . Alcohol Use: No    OB History    Grav Para Term Preterm Abortions TAB SAB Ect Mult Living   1 1 1       1       Review of Systems  Cardiovascular: Positive for chest pain.  All other systems reviewed and are negative.    Allergies  Hydrocodone and Morphine and related  Home Medications   Current Outpatient Rx  Name Route Sig Dispense Refill  . ALENDRONATE SODIUM 70 MG PO TABS Oral Take 1  tablet (70 mg total) by mouth every 7 (seven) days. Take with a full glass of water on an empty stomach. 4 tablet 11  . CALCIUM CARBONATE-VITAMIN D 600-400 MG-UNIT PO TABS Oral Take 1 tablet by mouth 2 (two) times daily.    [name] Kitchen CITALOPRAM HYDROBROMIDE 20 MG PO TABS Oral Take 1 tablet (20 mg total) by mouth daily. 30 tablet 2  . ESOMEPRAZOLE MAGNESIUM 40 MG PO PACK Oral Take 40 mg by mouth daily before breakfast. 30 each 12  . FLUTICASONE PROPIONATE 50 MCG/ACT NA SUSP Nasal Place 2 sprays into the nose daily. 16 g 3  . LORATADINE 10 MG PO TABS Oral Take 10 mg by mouth daily.    [name] Kitchen MAGNESIUM PO Oral Take by mouth daily.        BP 94/61  Pulse 76  Temp 98.1 F (36.7 C) (Oral)  Resp 20  SpO2 99%  Physical Exam  Nursing note and vitals reviewed. Constitutional: She is oriented to person, place, and time. She appears well-developed and well-nourished.  Non-toxic appearance. No distress.  HENT:  Head: Normocephalic and atraumatic.  Eyes: Conjunctivae, EOM and lids are normal. Pupils are equal, round, and reactive to light.  Neck: Normal range of motion. Neck supple. No  tracheal deviation present. No mass present.  Cardiovascular: Normal rate, regular rhythm and normal heart sounds.  Exam reveals no gallop.   No murmur heard. Pulmonary/Chest: Effort normal and breath sounds normal. No stridor. No respiratory distress. She has no decreased breath sounds. She has no wheezes. She has no rhonchi. She has no rales.  Abdominal: Soft. Normal appearance and bowel sounds are normal. She exhibits no distension. There is no tenderness. There is no rebound and no CVA tenderness.  Musculoskeletal: Normal range of motion. She exhibits no edema and no tenderness.  Neurological: She is alert and oriented to person, place, and time. She has normal strength. No cranial nerve deficit or sensory deficit. GCS eye subscore is 4. GCS verbal subscore is 5. GCS motor subscore is 6.  Skin: Skin is warm and dry. No abrasion  and no rash noted.  Psychiatric: She has a normal mood and affect. Her speech is normal and behavior is normal.    ED Course  Procedures (including critical care time)   Labs Reviewed  D-DIMER, QUANTITATIVE   No results found.   No diagnosis found.    MDM   Date: 03/04/2012  Rate: 68  Rhythm: normal sinus rhythm  QRS Axis: normal  Intervals: normal  ST/T Wave abnormalities: normal  Conduction Disutrbances:Incomplete right bundle-branch block  Narrative Interpretation:   Old EKG Reviewed: none available  10:29 AM Patient with negative d-dimer and chest x-ray. No concern for ACS. Suspect patient with muscle strain and will get patient prescriptions for this        Toy Baker, MD 03/04/12 1029

## 2012-03-06 ENCOUNTER — Ambulatory Visit (INDEPENDENT_AMBULATORY_CARE_PROVIDER_SITE_OTHER): Payer: Self-pay | Admitting: Licensed Clinical Social Worker

## 2012-03-06 DIAGNOSIS — F411 Generalized anxiety disorder: Secondary | ICD-10-CM

## 2012-03-20 ENCOUNTER — Ambulatory Visit (INDEPENDENT_AMBULATORY_CARE_PROVIDER_SITE_OTHER): Payer: Self-pay | Admitting: Licensed Clinical Social Worker

## 2012-03-20 DIAGNOSIS — F411 Generalized anxiety disorder: Secondary | ICD-10-CM

## 2012-04-01 ENCOUNTER — Telehealth: Payer: Self-pay | Admitting: Family

## 2012-04-01 NOTE — Telephone Encounter (Signed)
Lovaza will cost her >200$/month.  It is prescription fish oil.  If she wants she can buy fish oil otc and take 1000mg   Caps 2 caps bid.

## 2012-04-01 NOTE — Telephone Encounter (Signed)
Patient states that she has high cholesterol and has heard that lovaza helps lower your cholesterol. She would like to know if Melissa would prescribe that to her. [facility] in [place]

## 2012-04-02 NOTE — Telephone Encounter (Signed)
Notified pt. She states she has purchased otc fish oil but is concerned "that the capsules are not flexible and don't look right." Advised pt to take medication to the pharmacy for verification of medication.

## 2012-04-03 ENCOUNTER — Ambulatory Visit: Payer: Self-pay | Admitting: Licensed Clinical Social Worker

## 2012-07-10 ENCOUNTER — Other Ambulatory Visit: Payer: Self-pay | Admitting: Family

## 2012-07-10 DIAGNOSIS — Z1231 Encounter for screening mammogram for malignant neoplasm of breast: Secondary | ICD-10-CM

## 2012-07-11 ENCOUNTER — Ambulatory Visit: Payer: Self-pay | Admitting: Family

## 2012-07-11 DIAGNOSIS — Z0289 Encounter for other administrative examinations: Secondary | ICD-10-CM

## 2012-07-14 ENCOUNTER — Encounter: Payer: Self-pay | Admitting: Family

## 2012-07-14 ENCOUNTER — Ambulatory Visit (INDEPENDENT_AMBULATORY_CARE_PROVIDER_SITE_OTHER): Payer: Self-pay | Admitting: Family

## 2012-07-14 VITALS — BP 110/76 | HR 78 | Temp 98.2°F | Resp 16 | Ht 66.0 in | Wt 128.1 lb

## 2012-07-14 DIAGNOSIS — F419 Anxiety disorder, unspecified: Secondary | ICD-10-CM

## 2012-07-14 DIAGNOSIS — F341 Dysthymic disorder: Secondary | ICD-10-CM

## 2012-07-14 DIAGNOSIS — M79673 Pain in unspecified foot: Secondary | ICD-10-CM

## 2012-07-14 DIAGNOSIS — M545 Low back pain: Secondary | ICD-10-CM

## 2012-07-14 DIAGNOSIS — M79609 Pain in unspecified limb: Secondary | ICD-10-CM

## 2012-07-14 DIAGNOSIS — J309 Allergic rhinitis, unspecified: Secondary | ICD-10-CM

## 2012-07-14 MED ORDER — MELOXICAM 7.5 MG PO TABS: 7.5000 mg | ORAL_TABLET | Freq: Every day | ORAL | Status: DC

## 2012-07-14 NOTE — Assessment & Plan Note (Signed)
Continue flonase/claritin.
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
## 2012-07-14 NOTE — Assessment & Plan Note (Signed)
Trial of meloxicam.  

## 2012-07-14 NOTE — Patient Instructions (Addendum)
Use nasal saline spray twice daily. Call if your back or heel pain worsens or if it does not improve.  Please schedule a follow up appointment in 3 months for a fasting physical (afer 4/23)

## 2012-07-14 NOTE — Assessment & Plan Note (Signed)
Stable.  Monitor for now off of meds.  Pt wishes to try to avoid meds if possible.  Unfortunately, she is unable to afford to travel to see therapist.

## 2012-07-14 NOTE — Assessment & Plan Note (Signed)
Likely plantar fasciitis. Will give trial of meloxicam and recommended that she ice heel daily.

## 2012-08-01 ENCOUNTER — Encounter: Payer: Self-pay | Admitting: Obstetrics & Gynecology

## 2012-08-01 ENCOUNTER — Ambulatory Visit (INDEPENDENT_AMBULATORY_CARE_PROVIDER_SITE_OTHER): Payer: Self-pay | Admitting: Obstetrics & Gynecology

## 2012-08-01 ENCOUNTER — Inpatient Hospital Stay (HOSPITAL_BASED_OUTPATIENT_CLINIC_OR_DEPARTMENT_OTHER): Admission: RE | Admit: 2012-08-01 | Payer: Self-pay | Source: Ambulatory Visit

## 2012-08-01 VITALS — BP 92/62 | HR 79 | Temp 97.0°F | Ht 66.0 in | Wt 126.0 lb

## 2012-08-01 DIAGNOSIS — Z87898 Personal history of other specified conditions: Secondary | ICD-10-CM

## 2012-08-01 DIAGNOSIS — Z8742 Personal history of other diseases of the female genital tract: Secondary | ICD-10-CM

## 2012-08-01 DIAGNOSIS — Z01419 Encounter for gynecological examination (general) (routine) without abnormal findings: Secondary | ICD-10-CM

## 2012-08-01 NOTE — Progress Notes (Signed)
Patient ID: Kellie [name], female   DOB: Oct 18, 1955, 57 y.o.   MRN: [id number] Subjective:     Kellie [name] is a 57 y.o. female here for a routine exam.  Current complaints: pt with no complaints.    Gynecologic History No LMP recorded. Patient is postmenopausal. Contraception: none Last Pap: 2013. Results were: normal Last mammogram: 2013. Results were: normal  Obstetric History OB History    Grav Para Term Preterm Abortions TAB SAB Ect Mult Living   1 1 1       1      # Outc Date GA Lbr Len/2nd Wgt Sex Del Anes PTL Lv   1 TRM                The following portions of the patient's history were reviewed and updated as appropriate: allergies, current medications, past family history, past medical history, past social history, past surgical history and problem list.  Review of Systems Pertinent items are noted in HPI.    Objective:    BP 92/62  Pulse 79  Temp 97 F (36.1 C) (Oral)  Ht 5\' 6"  (1.676 m)  Wt 126 lb (57.153 kg)  BMI 20.34 kg/m2  General Appearance:    Alert, cooperative, no distress, appears stated age  Head:    Normocephalic, without obvious abnormality, atraumatic        Nose:   Nares normal, septum midline, mucosa normal, no drainage    or sinus tenderness  Throat:   Lips, mucosa, and tongue normal; teeth and gums normal  Neck:   Supple, symmetrical, trachea midline, no adenopathy;    thyroid:  no enlargement/tenderness/nodules; no carotid   bruit or JVD  Back:     Symmetric, no curvature, ROM normal, no CVA tenderness  Lungs:     Clear to auscultation bilaterally, respirations unlabored  Chest Wall:    No tenderness or deformity   Heart:    Regular rate and rhythm, S1 and S2 normal, no murmur, rub   or gallop  Breast Exam:    No tenderness, masses, or nipple abnormality  Abdomen:     Soft, non-tender, bowel sounds active all four quadrants,    no masses, no organomegaly  Genitalia:    Normal female without lesion, discharge or tenderness  GU: EGBUS:  no lesions Vagina: no blood in vault Cervix: no lesion; no mucopurulent d/c Uterus: small, mobile Adnexa: no masses; sl tender       Extremities:   Extremities normal, atraumatic, no cyanosis or edema  Pulses:   2+ and symmetric all extremities  Skin:   Skin color, texture, turgor normal, no rashes or lesions            Assessment:    Healthy female exam.  H/o abnormal PAP 2011 s/p LEEP- no dysplasia in surg path    Plan:    Follow up in: 1 year.   F/u PAP and HPV Pt had questions re HPV.  Handout given   Has mammogram scheduled

## 2012-08-01 NOTE — Patient Instructions (Addendum)
Cervical Dysplasia Cervical dysplasia is a condition in which a woman has abnormal changes in the cells of her cervix. The cervix is the opening to the uterus (womb) between the vagina and the uterus. These changes are called cervical dysplasia and may be the first signs of cervical cancer. These cells can be taken from the cervix during a Pap test and then looked at under a microscope. With early detection, treatment, and close follow-up care, nearly all cervical dysplasia can be cured. If untreated, the mild to moderate stages of dysplasia often grow more severe.  RISK FACTORS  The following increase the risk for cervical dysplasia.  Having had a sexually transmitted disease, including:  Chlamydia.  Human papilloma virus (HPV).  Becoming sexually active before age 32.  Having had more than 1 sexual partner.  Not using protection, such as condoms, during sexual intercourse, especially with new sexual partners.  Having had cancer of the vagina or vulva.  Having a sexual partner whose previous partner had cancer of the cervix or cervical dysplasia.  Having a sexual partner who has or has had cancer of the penis.  Having a weakened immune system (HIV, organ transplant).  Being the daughter of a woman who took DES (diethylstilbestrol) during pregnancy.  A history of cervical cancer in a woman's sister or mother.  Smoking.  Having had an abnormal Pap test in the past. SYMPTOMS  There are usually no symptoms. If there are symptoms, they may be vague such as:  Abnormal vaginal discharge.  Bleeding between periods or following intercourse.  Bleeding during menopause.  Pain on intercourse (dyspareunia). DIAGNOSIS   The Pap test is the best way of detecting abnormalities of the cervix.  Biopsy (removing a piece of tissue to look at under the microscope) of the cervix when the Pap test is abnormal or when the Pap test is normal, but the cervix looks abnormal. TREATMENT   Catching and treating the changes early with Pap tests can prevent cervical cancer.  Cryotherapy freezes the abnormal cells with a steel tip instrument.  A laser can be used to remove the abnormal cells.  Loop electrocautery excision procedure (LEEP). This procedure uses a heated electrical loop to remove a cone-like portion of the cervix, including the cervical canal.  For more serious cases of cervical dysplasia, the abnormal tissue may be removed surgically by:  A cone biopsy (by cold knife, laser or LEEP). A procedure in which a portion of the center of the cervix with the cervical canal is removed.  The uterus and cervix are removed (hysterectomy). Your caregiver will advise you regarding the need and timing of Pap tests in your follow-up. Women who have been treated for dysplasia should be closely followed with pelvic exams and Pap tests. During the first year following treatment of cervical dysplasia, Pap tests should be done every 3 to 4 months. In the second year, the schedule is every 6 months, or as recommended by your caregiver. See your caregiver for new or worsening problems. HOME CARE INSTRUCTIONS   Follow the instructions and recommendations of your caregiver regarding medicines and follow-up appointments.  Only take over-the-counter or prescription medicines for pain or discomfort as directed by your caregiver.  Cramping and pelvic discomfort may follow cryotherapy. It is not abnormal to have watery discharge for several weeks after.  Laser, cone surgery, cryotherapy or LEEP can cause a bad smelling vaginal discharge. It may also cause vaginal bleeding for a couple weeks following the procedure. The discharge  may be black from the paste used to control bleeding from the cone site. This is normal.  Do not use tampons, have sexual intercourse or douche until your caregiver says it is okay. SEEK MEDICAL CARE IF:   You develop genital warts.  You need a prescription for  pain medicine following your treatment. SEEK IMMEDIATE MEDICAL CARE IF:   Your bleeding is heavier than a normal menstrual period.  You develop bright red bleeding, especially if you have blood clots.  You have a fever.  You have increasing cramps or pain not relieved with medicine.  You are lightheaded, unusually weak, or have fainting spells.  You have abnormal vaginal discharge.  You develop abdominal pain. PREVENTION   The surest way to prevent cervical dysplasia is to abstain from sexual intercourse.  Practice safe sex, use condoms and have only one sex partner who does not have other sex partners.  A Pap test is done to screen for cervical cancer.  The first Pap test should be done at age 42.  Between ages 29 and 69, Pap tests are repeated every 2 years.  Beginning at age 19, you are advised to have a Pap test every 3 years as long as your past 3 Pap tests have been normal.  Some women have medical problems that increase the chance of getting cervical cancer. Talk to your caregiver about these problems. It is especially important to talk to your caregiver if a new problem develops soon after your last Pap test. In these cases, your caregiver may recommend more frequent screening and Pap tests.  The above recommendations are the same for women who have or have not gotten the vaccine for HPV (Human Papillomavirus).  If you had a hysterectomy for a problem that was not a cancer or a condition that could lead to cancer, then you no longer need Pap tests. However, even if you no longer need a Pap test, a regular exam is a good idea to make sure no other problems are starting.   If you are between ages 30 and 19, and you have had normal Pap tests going back 10 years, you no longer need Pap tests. However, even if you no longer need a Pap test, a regular exam is a good idea to make sure no other problems are starting.   If you have had past treatment for cervical cancer or a  condition that could lead to cancer, you need Pap tests and screening for cancer for at least 20 years after your treatment.  If Pap tests have been discontinued, risk factors (such as a new sexual partner) need to be re-assessed to determine if screening should be resumed.  Some women may need screenings more often if they are at high risk for cervical cancer.  Your caregiver may do additional tests including:  Colposcopy. A procedure in which a special microscope magnifies the cells and allows the provider to closely examine the cervix, vagina, and vulva.  Biopsy. A small tissue sample is taken from the cervix, vagina or vulva. This is generally done in your caregivers office.  A cone biopsy (cold knife or laser). A large tissue sample is taken from the cervix. This procedure is usually done in an operating room under a general anesthetic. The cone often removes all abnormal tissue and so may also complete the treatment.  LEEP, also removing a circular portion of the cervix and is done in a doctors office under a local anesthetic.  Now there  is a vaccine, Gardasil, that was developed to prevent the HPV'S that can cause cancer of the cervix and genital warts. It is recommended for females ages 51 to 36. It should not be given to pregnant women until more is known about its effects on the fetus. Not all cancers of the cervix are caused by the HPV. Routine gynecology exams and Pap tests should continue as recommended by your caregiver. Document Released: 06/11/2005 Document Revised: 09/03/2011 Document Reviewed: 06/02/2008 [facility] Patient Information 2013 [place], [place]. HPV Test The HPV (human papillomavirus) test is used to screen for high-risk types with HPV infection. HPV is a group of about 100 related viruses, of which 40 types are genital viruses. Most HPV viruses cause infections that usually resolve without treatment within 2 years. Some HPV infections can cause skin and genital warts  (condylomata). HPV types 16, 18, 31 and 45 are considered high-risk types of HPV. High-risk types of HPV do not usually cause visible warts, but if untreated, may lead to cancers of the outlet of the womb (cervix) or anus. An HPV test identifies the DNA (genetic) strands of the HPV infection. Because the test identifies the DNA strands, the test is also referred to as the HPV DNA test. Although HPV is found in both males and females, the HPV test is only used to screen for cervical cancer in females. This test is recommended for females:  With an abnormal Pap test.  After treatment of an abnormal Pap test.  Aged 1 and older.  After treatment of a high-risk HPV infection. The HPV test may be done at the same time as a Pap test in females over the age of 50. Both the HPV and Pap test require a sample of cells from the cervix. PREPARATION FOR TEST  You may be asked to avoid douching, tampons, or vaginal medicines for 48 hours before the HPV test. You will be asked to urinate before the test. For the HPV test, you will need to lie on an exam table with your feet in stirrups. A spatula will be inserted into the vagina. The spatula will be used to swab the cervix for a cell and mucus sample. The sample will be further evaluated in a lab under a microscope. NORMAL FINDINGS  Normal: High-risk HPV is not found.  Ranges for normal findings may vary among different laboratories and hospitals. You should always check with your doctor after having lab work or other tests done to discuss the meaning of your test results and whether your values are considered within normal limits. MEANING OF TEST An abnormal HPV test means that high-risk HPV is found. Your caregiver may recommend further testing. Your caregiver will go over the test results with you. He or she will and discuss the importance and meaning of your results, as well as treatment options and the need for additional tests, if necessary. OBTAINING THE  RESULTS  It is your responsibility to obtain your test results. Ask the lab or department performing the test when and how you will get your results. Document Released: 07/06/2004 Document Revised: 09/03/2011 Document Reviewed: 03/21/2005 [facility] Patient Information 2013 [place], [place].

## 2012-08-04 ENCOUNTER — Encounter: Payer: Self-pay | Admitting: Gastroenterology

## 2012-08-09 ENCOUNTER — Other Ambulatory Visit: Payer: Self-pay

## 2012-08-14 ENCOUNTER — Ambulatory Visit (HOSPITAL_COMMUNITY)
Admission: RE | Admit: 2012-08-14 | Discharge: 2012-08-14 | Disposition: A | Discharge status: Home / Self Care | Payer: Self-pay | Source: Ambulatory Visit | Attending: Family | Admitting: Family

## 2012-08-14 DIAGNOSIS — Z1231 Encounter for screening mammogram for malignant neoplasm of breast: Secondary | ICD-10-CM | POA: Insufficient documentation

## 2012-09-23 HISTORY — PX: ESOPHAGOGASTRODUODENOSCOPY: SHX1529

## 2012-09-23 HISTORY — PX: COLONOSCOPY: SHX174

## 2012-10-01 ENCOUNTER — Ambulatory Visit (AMBULATORY_SURGERY_CENTER): Payer: Self-pay | Admitting: *Deleted

## 2012-10-01 VITALS — Ht 66.0 in | Wt 126.4 lb

## 2012-10-01 DIAGNOSIS — K219 Gastro-esophageal reflux disease without esophagitis: Secondary | ICD-10-CM

## 2012-10-01 DIAGNOSIS — Z1211 Encounter for screening for malignant neoplasm of colon: Secondary | ICD-10-CM

## 2012-10-01 NOTE — Progress Notes (Signed)
Patient states she already has the MoviPrep at home and it is not expired or opened. Instructions given and answered patient's many questions.

## 2012-10-15 ENCOUNTER — Ambulatory Visit (AMBULATORY_SURGERY_CENTER): Payer: Self-pay | Admitting: Gastroenterology

## 2012-10-15 ENCOUNTER — Encounter: Payer: Self-pay | Admitting: Gastroenterology

## 2012-10-15 VITALS — BP 104/63 | HR 60 | Temp 96.8°F | Resp 22 | Ht 66.0 in | Wt 126.0 lb

## 2012-10-15 DIAGNOSIS — K5730 Diverticulosis of large intestine without perforation or abscess without bleeding: Secondary | ICD-10-CM

## 2012-10-15 DIAGNOSIS — K644 Residual hemorrhoidal skin tags: Secondary | ICD-10-CM

## 2012-10-15 DIAGNOSIS — K219 Gastro-esophageal reflux disease without esophagitis: Secondary | ICD-10-CM

## 2012-10-15 DIAGNOSIS — Z1211 Encounter for screening for malignant neoplasm of colon: Secondary | ICD-10-CM

## 2012-10-15 MED ORDER — SODIUM CHLORIDE 0.9 % IV SOLN: 500.0000 mL | INTRAVENOUS | Status: DC

## 2012-10-15 NOTE — Patient Instructions (Signed)
External Hemorrhoids, few diverticulum seen. Handouts given on hemorrhoids and diverticulosis. Repeat colonoscopy in 10 years. Normal upper endoscopy seen today. Consider taking OTC PPI (such as prilosec or prevacid) for GERD/reflux symptoms. Resume current medications. Call us with any questions or concerns. Thank you!  YOU HAD AN ENDOSCOPIC PROCEDURE TODAY AT THE [facility]: Refer to the procedure report that was given to you for any specific questions about what was found during the examination.  If the procedure report does not answer your questions, please call your gastroenterologist to clarify.  If you requested that your care partner not be given the details of your procedure findings, then the procedure report has been included in a sealed envelope for you to review at your convenience later.  YOU SHOULD EXPECT: Some feelings of bloating in the abdomen. Passage of more gas than usual.  Walking can help get rid of the air that was put into your GI tract during the procedure and reduce the bloating. If you had a lower endoscopy (such as a colonoscopy or flexible sigmoidoscopy) you may notice spotting of blood in your stool or on the toilet paper. If you underwent a bowel prep for your procedure, then you may not have a normal bowel movement for a few days.  DIET: Your first meal following the procedure should be a light meal and then it is ok to progress to your normal diet.  A half-sandwich or bowl of soup is an example of a good first meal.  Heavy or fried foods are harder to digest and may make you feel nauseous or bloated.  Likewise meals heavy in dairy and vegetables can cause extra gas to form and this can also increase the bloating.  Drink plenty of fluids but you should avoid alcoholic beverages for 24 hours.  ACTIVITY: Your care partner should take you home directly after the procedure.  You should plan to take it easy, moving slowly for the rest of the day.  You can resume  normal activity the day after the procedure however you should NOT DRIVE or use heavy machinery for 24 hours (because of the sedation medicines used during the test).    SYMPTOMS TO REPORT IMMEDIATELY: A gastroenterologist can be reached at any hour.  During normal business hours, 8:30 AM to 5:00 PM Monday through Friday, call [phone].  After hours and on weekends, please call the GI answering service at [phone] who will take a message and have the physician on call contact you.   Following lower endoscopy (colonoscopy or flexible sigmoidoscopy):  Excessive amounts of blood in the stool  Significant tenderness or worsening of abdominal pains  Swelling of the abdomen that is new, acute  Fever of 100F or higher  Following upper endoscopy (EGD)  Vomiting of blood or coffee ground material  New chest pain or pain under the shoulder blades  Painful or persistently difficult swallowing  New shortness of breath  Fever of 100F or higher  Black, tarry-looking stools  FOLLOW UP: If any biopsies were taken you will be contacted by phone or by letter within the next 1-3 weeks.  Call your gastroenterologist if you have not heard about the biopsies in 3 weeks.  Our staff will call the home number listed on your records the next business day following your procedure to check on you and address any questions or concerns that you may have at that time regarding the information given to you following your procedure. This is a  courtesy call and so if there is no answer at the home number and we have not heard from you through the emergency physician on call, we will assume that you have returned to your regular daily activities without incident.  SIGNATURES/CONFIDENTIALITY: You and/or your care partner have signed paperwork which will be entered into your electronic medical record.  These signatures attest to the fact that that the information above on your After Visit Summary has been reviewed  and is understood.  Full responsibility of the confidentiality of this discharge information lies with you and/or your care-partner.

## 2012-10-15 NOTE — Op Note (Signed)
[facility] [address] [place], [address]   ENDOSCOPY PROCEDURE REPORT  PATIENT: Kellie [name], Kellie [name]  MR#: [id number] BIRTHDATE: February 07, 1956 , 56  yrs. old GENDER: Female ENDOSCOPIST: Rachael Fee, MD PROCEDURE DATE:  10/15/2012 PROCEDURE:  EGD, diagnostic ASA CLASS:     Class II INDICATIONS:  chronic GERD, intermittent dysphagia. MEDICATIONS: There was residual sedation effect present from prior procedure, Versed 2 mg IV, and These medications were titrated to patient response per physician's verbal order TOPICAL ANESTHETIC: Cetacaine Spray  DESCRIPTION OF PROCEDURE: After the risks benefits and alternatives of the procedure were thoroughly explained, informed consent was obtained.  The LB GIF-H180 [id number] endoscope was introduced through the mouth and advanced to the second portion of the duodenum. Without limitations.  The instrument was slowly withdrawn as the mucosa was fully examined.    The upper, middle and distal third of the esophagus were carefully inspected and no abnormalities were noted.  The z-line was well seen at the GEJ.  The endoscope was pushed into the fundus which was normal including a retroflexed view.  The antrum, gastric body, first and second part of the duodenum were unremarkable. Retroflexed views revealed no abnormalities.     The scope was then withdrawn from the patient and the procedure completed. COMPLICATIONS: There were no complications.  ENDOSCOPIC IMPRESSION: Normal EGD  RECOMMENDATIONS: As recommended last year in the office, consider taking OTC PPI (such as prilosec or prevacid) daily for your GERD symptoms.    eSigned:  Rachael Fee, MD 10/15/2012 10:27 AM   CC: Sandford Craze

## 2012-10-15 NOTE — Progress Notes (Signed)
Patient did not experience any of the following events: a burn prior to discharge; a fall within the facility; wrong site/side/patient/procedure/implant event; or a hospital transfer or hospital admission upon discharge from the facility. (G8907) Patient did not have preoperative order for IV antibiotic SSI prophylaxis. (G8918)  

## 2012-10-15 NOTE — Op Note (Signed)
[facility] [address] [place], [address]   COLONOSCOPY PROCEDURE REPORT  PATIENT: [name], Kellie  MR#: [id number] BIRTHDATE: 14-Apr-1956 , 56  yrs. old GENDER: Female ENDOSCOPIST: Rachael Fee, MD REFERRED [id number] Peggyann Juba, FNP PROCEDURE DATE:  10/15/2012 PROCEDURE:   Colonoscopy, screening ASA CLASS:   Class II INDICATIONS:average risk screening. MEDICATIONS: Fentanyl 75 mcg IV, Versed 7 mg IV, and These medications were titrated to patient response per physician's verbal order  DESCRIPTION OF PROCEDURE:   After the risks benefits and alternatives of the procedure were thoroughly explained, informed consent was obtained.  A digital rectal exam revealed no abnormalities of the rectum.   The LB [id number]-[id number]  endoscope was introduced through the anus and advanced to the cecum, which was identified by both the appendix and ileocecal valve. No adverse events experienced.   The quality of the prep was good, using MoviPrep  The instrument was then slowly withdrawn as the colon was fully examined.   COLON FINDINGS: There were medium sized external hemorrhoids.  There were a few small diverticulum in left colon.  The examination was otherwise normal.  Retroflexed views revealed no abnormalities. The time to cecum=2 minutes 45 seconds.  Withdrawal time=6 minutes 04 seconds.  The scope was withdrawn and the procedure completed. COMPLICATIONS: There were no complications.  ENDOSCOPIC IMPRESSION: There were medium sized external hemorrhoids. There were a few small diverticulum in left colon. The examination was otherwise normal.  No polyps or cancers  RECOMMENDATIONS: You should continue to follow colorectal cancer screening guidelines for "routine risk" patients with a repeat colonoscopy in 10 years. There is no need for FOBT (stool) testing for at least 5 years.   eSigned:  Rachael Fee, MD 10/15/2012 10:20 AM

## 2012-10-16 ENCOUNTER — Telehealth: Payer: Self-pay | Admitting: Family

## 2012-10-16 ENCOUNTER — Encounter: Payer: Self-pay | Admitting: Family

## 2012-10-16 NOTE — Telephone Encounter (Signed)
Spoke with pt, she states she has had problems with external hemorrhoids x 4 weeks. They are causing pain. Advised pt to contact GI group that did her procedure yesterday for further recommendations re: hemorrhoids and let us know if further referrals are needed.

## 2012-10-16 NOTE — Telephone Encounter (Signed)
Patient called in requesting a referral to a GI doctor for her hemorrhoids.

## 2012-10-16 NOTE — Telephone Encounter (Signed)
Left detailed message on pt's cell# to call with detailed symptoms and duration and whether we have seen her for these symptoms before.

## 2012-11-04 ENCOUNTER — Encounter: Payer: Self-pay | Admitting: Family

## 2012-11-04 ENCOUNTER — Ambulatory Visit (INDEPENDENT_AMBULATORY_CARE_PROVIDER_SITE_OTHER): Payer: Self-pay | Admitting: Family

## 2012-11-04 VITALS — BP 90/66 | HR 76 | Temp 97.6°F | Resp 16 | Ht 66.0 in | Wt 124.0 lb

## 2012-11-04 DIAGNOSIS — Z0000 Encounter for general adult medical examination without abnormal findings: Secondary | ICD-10-CM

## 2012-11-04 DIAGNOSIS — F329 Major depressive disorder, single episode, unspecified: Secondary | ICD-10-CM

## 2012-11-04 DIAGNOSIS — K219 Gastro-esophageal reflux disease without esophagitis: Secondary | ICD-10-CM

## 2012-11-04 DIAGNOSIS — R41840 Attention and concentration deficit: Secondary | ICD-10-CM

## 2012-11-04 DIAGNOSIS — F419 Anxiety disorder, unspecified: Secondary | ICD-10-CM

## 2012-11-04 DIAGNOSIS — K649 Unspecified hemorrhoids: Secondary | ICD-10-CM

## 2012-11-04 DIAGNOSIS — F341 Dysthymic disorder: Secondary | ICD-10-CM

## 2012-11-04 DIAGNOSIS — A6000 Herpesviral infection of urogenital system, unspecified: Secondary | ICD-10-CM

## 2012-11-04 LAB — CBC WITH DIFFERENTIAL/PLATELET
Basophils Absolute: 0 10*3/uL (ref 0.0–0.1)
HCT: 39.6 % (ref 36.0–46.0)
Hemoglobin: 13.5 g/dL (ref 12.0–15.0)
Lymphocytes Relative: 27 % (ref 12–46)
Lymphs Abs: 1.1 10*3/uL (ref 0.7–4.0)
MCV: 89.4 fL (ref 78.0–100.0)
Monocytes Absolute: 0.4 10*3/uL (ref 0.1–1.0)
Monocytes Relative: 9 % (ref 3–12)
Neutro Abs: 2.5 10*3/uL (ref 1.7–7.7)
RBC: 4.43 MIL/uL (ref 3.87–5.11)
RDW: 13.3 % (ref 11.5–15.5)
WBC: 4.2 10*3/uL (ref 4.0–10.5)

## 2012-11-04 LAB — HEPATIC FUNCTION PANEL
Albumin: 5 g/dL (ref 3.5–5.2)
Bilirubin, Direct: 0.1 mg/dL (ref 0.0–0.3)
Total Bilirubin: 0.4 mg/dL (ref 0.3–1.2)

## 2012-11-04 LAB — LIPID PANEL
HDL: 95 mg/dL (ref >39)
LDL Cholesterol: 114 mg/dL — ABNORMAL HIGH (ref 0–99)
Total CHOL/HDL Ratio: 2.3 Ratio
Triglycerides: 56 mg/dL (ref <150)

## 2012-11-04 LAB — BASIC METABOLIC PANEL WITH GFR
Calcium: 10.3 mg/dL (ref 8.4–10.5)
Creat: 0.77 mg/dL (ref 0.50–1.10)
GFR, Est African American: >89 mL/min
GFR, Est Non African American: 86 mL/min
Sodium: 142 mEq/L (ref 135–145)

## 2012-11-04 LAB — URINALYSIS, ROUTINE W REFLEX MICROSCOPIC
Bilirubin Urine: NEGATIVE
Hgb urine dipstick: NEGATIVE
Ketones, ur: NEGATIVE mg/dL
Specific Gravity, Urine: 1.017 (ref 1.005–1.030)
Urobilinogen, UA: 0.2 mg/dL (ref 0.0–1.0)

## 2012-11-04 MED ORDER — ACYCLOVIR 200 MG PO CAPS: 400.0000 mg | ORAL_CAPSULE | Freq: Two times a day (BID) | ORAL | Status: DC

## 2012-11-04 NOTE — Patient Instructions (Addendum)
You will be contacted about your referral to psychology.  Please let us know if you have not heard back within 1 week about your referral.
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
## 2012-11-05 ENCOUNTER — Encounter: Payer: Self-pay | Admitting: Family

## 2012-11-05 DIAGNOSIS — K649 Unspecified hemorrhoids: Secondary | ICD-10-CM | POA: Insufficient documentation

## 2012-11-05 DIAGNOSIS — A6000 Herpesviral infection of urogenital system, unspecified: Secondary | ICD-10-CM | POA: Insufficient documentation

## 2012-11-05 NOTE — Assessment & Plan Note (Signed)
She reports monthly breakouts. Wants suppressive therapy. Rx with acyclovir as this is on $4 list at [facility].

## 2012-11-05 NOTE — Assessment & Plan Note (Signed)
Depression/anxiety are reportedly well controlled. The patient is interested in being evaluated for ADHD. She has the cone coverage. Will see if [facility] can perform the testing under the cone coverage.

## 2012-11-05 NOTE — Assessment & Plan Note (Signed)
Deteriorated. #20 tabs of nexium samples provided to pt.

## 2012-11-05 NOTE — Assessment & Plan Note (Signed)
Recommended high fiber diet, miralax prn constipation. Recommended epsom salt sitz baths prn if flare ups.

## 2012-11-10 ENCOUNTER — Telehealth: Payer: Self-pay | Admitting: *Deleted

## 2012-11-10 ENCOUNTER — Telehealth: Payer: Self-pay | Admitting: Family

## 2012-11-10 NOTE — Telephone Encounter (Signed)
Message copied by Sandford Craze on Mon Nov 10, 2012 10:05 AM ------      Message from: Darral Dash E      Created: Mon Nov 10, 2012  9:48 AM          Referral sent to [facility],  They will schedule with pt      ----- Message -----         From: Sandford Craze, NP         Sent: 11/07/2012   8:31 AM           To: Keith Rake Aheron            Could you please let pt know and then arrange? thanks      ----- Message -----         From: Eulah Pont         Sent: 11/06/2012   4:14 PM           To: Sandford Craze, NP              [facility] will do computerized test ,patient will have a $40 copay.        ----- Message -----         From: Sandford Craze, NP         Sent: 11/06/2012   3:48 PM           To: Eulah Pont            She has th [facility] coverage. Do they take that?      ----- Message -----         From: Eulah Pont         Sent: 11/06/2012  10:44 AM           To: Sandford Craze, NP              I'm not showing any Insurance coverage ,so pt would be self pay                  ----- Message -----         From: Sandford Craze, NP         Sent: 11/04/2012   8:42 PM           To: Eulah Pont            This patient would like ADHD eval. Could you please check with  behavioral health or behavioral health in [place] and see if they accept her cone insurance and if they do adh eval, if so, I will place referral order. Thanks                               ------

## 2012-11-10 NOTE — Addendum Note (Signed)
Addended by: Sandford Craze on: 11/10/2012 05:53 AM   Modules accepted: Orders

## 2012-11-10 NOTE — Telephone Encounter (Signed)
Received message from pt requesting status of psychology referral for ADHD testing within the [facility]?

## 2012-11-11 NOTE — Telephone Encounter (Signed)
Thanks, they have not called pt. Could you please let her know that they should be contacting her?

## 2012-11-11 NOTE — Telephone Encounter (Signed)
Spoke with Terri @ [facility] yesterday she stated she will schedule with pt  @ the  [place] site

## 2012-11-11 NOTE — Telephone Encounter (Signed)
Kellie [name], could you pls call pt?

## 2012-11-28 ENCOUNTER — Ambulatory Visit (INDEPENDENT_AMBULATORY_CARE_PROVIDER_SITE_OTHER): Payer: Self-pay | Admitting: Licensed Clinical Social Worker

## 2012-11-28 ENCOUNTER — Telehealth: Payer: Self-pay | Admitting: Family

## 2012-11-28 DIAGNOSIS — F321 Major depressive disorder, single episode, moderate: Secondary | ICD-10-CM

## 2012-11-28 NOTE — Telephone Encounter (Signed)
Called patient to schedule appointment with [facility] and patient became very upset. Patient states that "Kellie [name] needs to read about what happened at today's appointment with Berniece Andreas". Patient declined to schedule appointment.

## 2012-11-28 NOTE — Telephone Encounter (Signed)
Received call from Berniece Andreas, pt's therapist who did eval for ADHD.  Reports that results suggestive of ADHD but the pt is also presenting with some depression. She also questions some possible congnitive impairment.

## 2012-11-28 NOTE — Telephone Encounter (Signed)
Please call pt and arrange follow up visit re: concentration problems.

## 2012-12-02 ENCOUNTER — Ambulatory Visit: Payer: Self-pay | Admitting: Licensed Clinical Social Worker

## 2013-03-13 ENCOUNTER — Ambulatory Visit (INDEPENDENT_AMBULATORY_CARE_PROVIDER_SITE_OTHER): Payer: Self-pay | Admitting: Family Medicine

## 2013-03-13 ENCOUNTER — Encounter: Payer: Self-pay | Admitting: Family Medicine

## 2013-03-13 VITALS — BP 109/73 | HR 76 | Temp 98.1°F | Resp 16 | Ht 66.0 in | Wt 125.0 lb

## 2013-03-13 DIAGNOSIS — S239XXA Sprain of unspecified parts of thorax, initial encounter: Secondary | ICD-10-CM

## 2013-03-13 DIAGNOSIS — S29019A Strain of muscle and tendon of unspecified wall of thorax, initial encounter: Secondary | ICD-10-CM

## 2013-03-13 DIAGNOSIS — K219 Gastro-esophageal reflux disease without esophagitis: Secondary | ICD-10-CM

## 2013-03-13 NOTE — Assessment & Plan Note (Signed)
I gave pt #30 samples of dexilant 60mg  and encouraged her to take this once every morning.

## 2013-03-13 NOTE — Assessment & Plan Note (Signed)
She has myofascial pain in multiple areas of posterior chest wall/thoracic region.  No trigger point palpable. Reassured pt that I do not think she has pleurisy. NSAIDs have not been helpful so I recommended she stop these. I recommended she stop her home exercises for now (jumping jacks), and I recommended she go to PT to get some therapy for this. [facility] referral ordered today.
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
## 2013-03-17 ENCOUNTER — Ambulatory Visit: Payer: Self-pay | Admitting: Family Medicine

## 2013-04-17 ENCOUNTER — Encounter: Payer: Self-pay | Admitting: Family

## 2013-04-17 ENCOUNTER — Ambulatory Visit (INDEPENDENT_AMBULATORY_CARE_PROVIDER_SITE_OTHER): Payer: Self-pay | Admitting: Family

## 2013-04-17 VITALS — BP 102/64 | HR 87 | Temp 98.0°F | Resp 16 | Ht 66.0 in | Wt 124.1 lb

## 2013-04-17 DIAGNOSIS — J329 Chronic sinusitis, unspecified: Secondary | ICD-10-CM

## 2013-04-17 MED ORDER — AMOXICILLIN 500 MG PO CAPS: 500.0000 mg | ORAL_CAPSULE | Freq: Three times a day (TID) | ORAL | Status: DC
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
## 2013-04-17 NOTE — Patient Instructions (Addendum)
Please call [facility] to reschedule- [phone] Apply hydrocortisone with tip of finger to the external area of your ear canal twice daily to help with itching. Start amoxicillin for sinus infection- call if symptoms worsen or if not improve in 2-3 days.

## 2013-04-20 DIAGNOSIS — J329 Chronic sinusitis, unspecified: Secondary | ICD-10-CM | POA: Insufficient documentation

## 2013-04-20 NOTE — Assessment & Plan Note (Addendum)
rx with amoxicillin. Recommended topical application of otc hydrocortisone to outer ear canal for itching. No obvious tonsillitis noted on exam today.  We did discuss tonsil stones today.  I have given her the number for [facility] to call to reschedule her appointment.

## 2013-04-21 ENCOUNTER — Telehealth: Payer: Self-pay | Admitting: *Deleted

## 2013-04-21 NOTE — Telephone Encounter (Signed)
I called patient to schedule this appointment and she became very upset over this. She states that we do not listen to anything that she says. She states that she hasn't had the test done and wants to be referred out for this.

## 2013-04-21 NOTE — Telephone Encounter (Signed)
Pt called wanting to know the status of "lung function testing to check for COPD". Pt states we told her we would arrange test and someone would contact her with appt date/time.  Please advise.

## 2013-04-21 NOTE — Telephone Encounter (Signed)
She is requesting PFT's.  She can schedule a 30 minute appointment at her convenience and we will complete.

## 2013-04-21 NOTE — Telephone Encounter (Signed)
Spoke with pt and explained that we can do spirometry here in the office. Asked if pt is requesting to see pulmonologist for further consultation and testing. Pt states she is agreeable to have test done in our office and misunderstood need to schedule appt with Provider for spirometry as she thought she would just be having test only. Explained to pt that Provider will see at the time of her test to give her an interpretation. Pt voices understanding and was transferred to front office to arrange appt.

## 2013-04-21 NOTE — Telephone Encounter (Signed)
Please call pt to arrange 30 minute appt.

## 2013-04-22 ENCOUNTER — Encounter: Payer: Self-pay | Admitting: Family

## 2013-04-22 ENCOUNTER — Ambulatory Visit (INDEPENDENT_AMBULATORY_CARE_PROVIDER_SITE_OTHER): Payer: Self-pay | Admitting: Family

## 2013-04-22 VITALS — BP 96/66 | HR 68 | Temp 98.3°F | Resp 16 | Wt 124.0 lb

## 2013-04-22 DIAGNOSIS — J45909 Unspecified asthma, uncomplicated: Secondary | ICD-10-CM

## 2013-04-22 DIAGNOSIS — R05 Cough: Secondary | ICD-10-CM | POA: Insufficient documentation

## 2013-04-22 DIAGNOSIS — R0600 Dyspnea, unspecified: Secondary | ICD-10-CM

## 2013-04-22 DIAGNOSIS — R0609 Other forms of dyspnea: Secondary | ICD-10-CM

## 2013-04-22 MED ORDER — FLUTICASONE-SALMETEROL 100-50 MCG/DOSE IN AEPB: 1.0000 | INHALATION_SPRAY | Freq: Two times a day (BID) | RESPIRATORY_TRACT | Status: DC

## 2013-04-22 MED ORDER — FLUTICASONE-SALMETEROL 250-50 MCG/DOSE IN AEPB: 1.0000 | INHALATION_SPRAY | Freq: Two times a day (BID) | RESPIRATORY_TRACT | Status: DC

## 2013-04-22 MED ORDER — ALBUTEROL SULFATE (2.5 MG/3ML) 0.083% IN NEBU
2.5000 mg | INHALATION_SOLUTION | Freq: Once | RESPIRATORY_TRACT | Status: AC
  Administered 2013-04-22: 2.5 mg via RESPIRATORY_TRACT

## 2013-04-22 NOTE — Patient Instructions (Signed)
Start advair. Please complete patient assistance program and mail in along with prescription. You will be contacted about your referral to pulmonology and for the echocardiogram (ultrasound to check your heart) Follow up in 6 weeks.
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
## 2013-04-22 NOTE — Assessment & Plan Note (Signed)
PFT's show normal Fev1 pre-bronchodilator, with slight improvement post bronchodilator.  Will give trial of advair.  We had 3 weeks rx of samples of the 100/50 which I have given to the patient.  I did provide the patient with rx for 250/50 and printed the patient assistance form for her to complete and mail in. Will order baseline 2d echo to assess cardiac function. Will also refer to pulmonology for further evaluation at pt request.

## 2013-04-29 ENCOUNTER — Ambulatory Visit (HOSPITAL_BASED_OUTPATIENT_CLINIC_OR_DEPARTMENT_OTHER)
Admission: RE | Admit: 2013-04-29 | Discharge: 2013-04-29 | Disposition: A | Discharge status: Home / Self Care | Payer: No Typology Code available for payment source | Source: Ambulatory Visit | Attending: Family | Admitting: Family

## 2013-04-29 DIAGNOSIS — R0989 Other specified symptoms and signs involving the circulatory and respiratory systems: Secondary | ICD-10-CM | POA: Insufficient documentation

## 2013-04-29 DIAGNOSIS — J45909 Unspecified asthma, uncomplicated: Secondary | ICD-10-CM

## 2013-04-29 DIAGNOSIS — F3289 Other specified depressive episodes: Secondary | ICD-10-CM | POA: Insufficient documentation

## 2013-04-29 DIAGNOSIS — I059 Rheumatic mitral valve disease, unspecified: Secondary | ICD-10-CM | POA: Insufficient documentation

## 2013-04-29 DIAGNOSIS — K219 Gastro-esophageal reflux disease without esophagitis: Secondary | ICD-10-CM | POA: Insufficient documentation

## 2013-04-29 DIAGNOSIS — E785 Hyperlipidemia, unspecified: Secondary | ICD-10-CM | POA: Insufficient documentation

## 2013-04-29 DIAGNOSIS — F329 Major depressive disorder, single episode, unspecified: Secondary | ICD-10-CM | POA: Insufficient documentation

## 2013-04-29 DIAGNOSIS — R0609 Other forms of dyspnea: Secondary | ICD-10-CM | POA: Insufficient documentation

## 2013-04-29 NOTE — Progress Notes (Signed)
*  PRELIMINARY RESULTS* Echocardiogram 2D Echocardiogram has been performed.  Kellie [name] 04/29/2013, 10:42 AM

## 2013-04-30 ENCOUNTER — Other Ambulatory Visit (INDEPENDENT_AMBULATORY_CARE_PROVIDER_SITE_OTHER): Payer: No Typology Code available for payment source

## 2013-04-30 DIAGNOSIS — F321 Major depressive disorder, single episode, moderate: Secondary | ICD-10-CM

## 2013-05-11 ENCOUNTER — Telehealth: Payer: Self-pay | Admitting: *Deleted

## 2013-05-11 ENCOUNTER — Encounter: Payer: Self-pay | Admitting: Critical Care Medicine

## 2013-05-11 ENCOUNTER — Ambulatory Visit (INDEPENDENT_AMBULATORY_CARE_PROVIDER_SITE_OTHER): Payer: No Typology Code available for payment source | Admitting: Critical Care Medicine

## 2013-05-11 VITALS — BP 120/70 | HR 70 | Temp 98.3°F | Ht 66.0 in | Wt 126.0 lb

## 2013-05-11 DIAGNOSIS — R4189 Other symptoms and signs involving cognitive functions and awareness: Secondary | ICD-10-CM

## 2013-05-11 DIAGNOSIS — R05 Cough: Secondary | ICD-10-CM

## 2013-05-11 MED ORDER — CHLORPHENIRAMINE MALEATE 4 MG PO TABS: 4.0000 mg | ORAL_TABLET | Freq: Two times a day (BID) | ORAL | Status: DC | PRN

## 2013-05-11 MED ORDER — FLUTICASONE PROPIONATE 50 MCG/ACT NA SUSP: 2.0000 | Freq: Every day | NASAL | Status: DC

## 2013-05-11 MED ORDER — PREDNISONE 10 MG PO TABS: ORAL_TABLET | ORAL | Status: DC

## 2013-05-11 MED ORDER — OMEPRAZOLE 20 MG PO CPDR: DELAYED_RELEASE_CAPSULE | ORAL | Status: DC
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
## 2013-05-11 NOTE — Assessment & Plan Note (Signed)
Cyclical cough which is not yet proven to be asthma with normal spirometry Suspect significant upper airway component and postnasal drip syndrome and reflux disease playing a role Plan Follow STRICT reflux diet STop ADVAIR STop Fish Oil for now Start Flonase two puff ea nostril daily Start prednisone 10mg  Take 4 for two days three for two days two for two days one for two days Start chlorpheniramine 4mg  Three at bedtime Stop nexium Start omeprazole one before meal daily Use Delsym over the counter for cough Keep sugar free candy drop in mouth , train self to swallow not cough Return 2 weeks for recheck with Tammy Parrett, bring all medications in for medication reconciliation See Dr Delford Field 6 weeks

## 2013-05-11 NOTE — Patient Instructions (Signed)
Follow STRICT reflux diet STop ADVAIR STop Fish Oil for now Start Flonase two puff ea nostril daily Start prednisone 10mg  Take 4 for two days three for two days two for two days one for two days Start chlorpheniramine 4mg  Three at bedtime Stop nexium Start omeprazole one before meal daily Use Delsym over the counter for cough Keep sugar free candy drop in mouth , train self to swallow not cough Return 2 weeks for recheck with Tammy Parrett, bring all medications in for medication reconciliation See Dr Delford Field 6 weeks

## 2013-05-11 NOTE — Telephone Encounter (Signed)
Received message from pt requesting results of ADHD testing that was done with Berniece Andreas at [facility].  Left message with Raynelle Fanning Whitt's office to forward results to [place] once information is final. Awaiting result.

## 2013-05-11 NOTE — Telephone Encounter (Signed)
Received ADHD testing from [facility] and forwarded result to Provider.  Please advise.

## 2013-05-13 ENCOUNTER — Telehealth: Payer: Self-pay | Admitting: Family

## 2013-05-13 NOTE — Telephone Encounter (Signed)
Called to speak with Kellie [name] re: this pt at [facility] [phone].  She is with pts all day except has opening between 4 and 5. Would you please contact her this afternoon and confirm that it was she who called and see if you can get a little more info re: question/nature of call? thanks

## 2013-05-13 NOTE — Telephone Encounter (Signed)
[facility] request call back

## 2013-05-14 NOTE — Telephone Encounter (Signed)
Spoke with Raynelle Fanning whitt re: test results.  Pt did very poorly. She is concerned re: cognitive impairment.  Neg for ADHD.

## 2013-05-14 NOTE — Telephone Encounter (Signed)
Please call pt and let her know that her testing was negative for ADHD however I would like to refer her for some additional evaluation and will refer her to neuropsychiatry.

## 2013-05-15 NOTE — Telephone Encounter (Signed)
Raynelle Fanning returned my call and states that she spoke with [facility] yesterday and nothing else is needed at this time.

## 2013-05-15 NOTE — Telephone Encounter (Signed)
Notified pt. She voices understanding and states she has already spoke to [employer] re: results.

## 2013-05-15 NOTE — Telephone Encounter (Signed)
Kellie [name]'s office at [phone], left message on voicemail to return our call.

## 2013-05-25 ENCOUNTER — Encounter: Payer: Self-pay | Admitting: Adult Health

## 2013-05-25 ENCOUNTER — Telehealth: Payer: Self-pay | Admitting: Family

## 2013-05-25 ENCOUNTER — Ambulatory Visit (INDEPENDENT_AMBULATORY_CARE_PROVIDER_SITE_OTHER): Payer: No Typology Code available for payment source | Admitting: Adult Health

## 2013-05-25 VITALS — BP 108/72 | HR 88 | Temp 98.4°F | Ht 66.0 in | Wt 122.0 lb

## 2013-05-25 DIAGNOSIS — R05 Cough: Secondary | ICD-10-CM

## 2013-05-25 NOTE — Progress Notes (Signed)
   Subjective:    Patient ID: Kellie [name], female    DOB: 25-Sep-1955, 57 y.o.   MRN: [id number]  HPI 57 yo female with cyclical cough    05/25/2013 Follow up  Patient returns for a two-week followup for cough. Patient was seen last visit with flare for cyclical cough. She was treated with a prednisone taper along with aggressive reflux, and rhinitis prevention . Patient says that her cough did improve however, she did stop her regimen thinking that she only needed to take it for a week. We discussed her possible triggers for cough and a, prevention regimen to help manage these triggers. She does have, nasal congestion, postnasal drainage, and a dry cough. Has intermittent hoarseness, on and off. Patient denies any hemoptysis, orthopnea, PND, leg swelling, recent travel. Previous cyclical cough. Workup revealed a negative CT sinus in 2013, along with a negative. Chest x-ray. Patient is a never smoker. We reviewed her medications in detail with pt education.       Review of Systems Constitutional:   No  weight loss, night sweats,  Fevers, chills, fatigue, or  lassitude.  HEENT:   No headaches,  Difficulty swallowing,  Tooth/dental problems, or  Sore throat,                No sneezing, itching, ear ache,  +nasal congestion, post nasal drip,   CV:  No chest pain,  Orthopnea, PND, swelling in lower extremities, anasarca, dizziness, palpitations, syncope.   GI  No heartburn, indigestion, abdominal pain, nausea, vomiting, diarrhea, change in bowel habits, loss of appetite, bloody stools.   Resp:   No chest wall deformity  Skin: no rash or lesions.  GU: no dysuria, change in color of urine, no urgency or frequency.  No flank pain, no hematuria   MS:  No joint pain or swelling.  No decreased range of motion.  No back pain.  Psych:  No change in mood or affect. No depression or anxiety.  No memory loss.         Objective:   Physical Exam GEN: A/Ox3; pleasant , NAD, well  nourished   HEENT:  [place]/AT,  EACs-clear, TMs-wnl, NOSE-clear drainage, THROAT-clear, no lesions, no postnasal drip or exudate noted.   NECK:  Supple w/ fair ROM; no JVD; normal carotid impulses w/o bruits; no thyromegaly or nodules palpated; no lymphadenopathy.  RESP  Clear  P & A; w/o, wheezes/ rales/ or rhonchi.no accessory muscle use, no dullness to percussion  CARD:  RRR, no m/r/g  , no peripheral edema, pulses intact, no cyanosis or clubbing.  GI:   Soft & nt; nml bowel sounds; no organomegaly or masses detected.  Musco: Warm bil, no deformities or joint swelling noted.   Neuro: alert, no focal deficits noted.    Skin: Warm, no lesions or rashes     Assessment & Plan:

## 2013-05-25 NOTE — Telephone Encounter (Signed)
Kellie [name], could you please follow up on status of referral and update patient?

## 2013-05-25 NOTE — Patient Instructions (Addendum)
Follow STRICT reflux diet Continue on  Flonase two puff ea nostril daily Continue on Chlorpheniramine 4mg  2-3 at bedtime Continue on Omeprazole one before meal daily Take Delsym 2 tsp Twice daily  -may use generic.  Keep sugar free candy drop in mouth , train self to swallow not cough May use sips of water to soothe throat.  Avoid Mints  Follow up Dr. Delford Field  In 4 weeks as planned and As needed   Please contact office for sooner follow up if symptoms do not improve or worsen or seek emergency care

## 2013-05-25 NOTE — Telephone Encounter (Signed)
PATIENT STATES SHE WAS TOLD 8 DAYS AGO THAT SOMEONE FROM PHYSCOLOGY WOULD CALL HER TO SCHEDULE AN APPOINTMENT AND SHE HAS HEARD NOTHING FROM THEM.  I CANNOT SEE THESE REFERRALS ONCE THEY ARE PUT IN THE QUE

## 2013-05-25 NOTE — Telephone Encounter (Signed)
Referral is supposed to be to neuropsychiatry per 05/11/13 phone note.  Please advise.

## 2013-05-26 ENCOUNTER — Telehealth: Payer: Self-pay | Admitting: Family

## 2013-05-26 NOTE — Telephone Encounter (Signed)
RE: Referral Received: Today     Oneal Grout Terri Louise Slaugenhaupt, CCC-SLP; Sandford Craze, NP            Thanks... I'm very sorry. I will let Nijee Heatwole O'sullivan know.      Previous Messages      ----- Message -----  From: Rachel Bo, CCC-SLP  Sent: 05/26/2013 8:50 AM  To: Oneal Grout  Subject: Referral   FYI: We called your patient, Kellie [name] MRN: [id number] and she has refused to schedule an appointment with [place]. She was very upset that we even called her and said she will be calling your office to discuss.   We appreciate the referral.

## 2013-05-26 NOTE — Telephone Encounter (Signed)
Spoke with Aurther Loft [facility]) they will contact pt to sch appt

## 2013-05-27 NOTE — Assessment & Plan Note (Addendum)
Cyclical cough with AR/GERD triggers  Advised on cough control.  cxr and CT sinus neg in 2013   Plan

## 2013-06-30 ENCOUNTER — Ambulatory Visit (INDEPENDENT_AMBULATORY_CARE_PROVIDER_SITE_OTHER): Payer: No Typology Code available for payment source | Admitting: Critical Care Medicine

## 2013-06-30 ENCOUNTER — Other Ambulatory Visit: Payer: Self-pay

## 2013-06-30 ENCOUNTER — Encounter: Payer: Self-pay | Admitting: Critical Care Medicine

## 2013-06-30 VITALS — BP 106/64 | HR 82 | Temp 97.8°F | Ht 66.0 in | Wt 124.0 lb

## 2013-06-30 DIAGNOSIS — R05 Cough: Secondary | ICD-10-CM

## 2013-06-30 DIAGNOSIS — R059 Cough, unspecified: Secondary | ICD-10-CM

## 2013-06-30 DIAGNOSIS — K056 Periodontal disease, unspecified: Secondary | ICD-10-CM

## 2013-06-30 DIAGNOSIS — K069 Disorder of gingiva and edentulous alveolar ridge, unspecified: Secondary | ICD-10-CM

## 2013-06-30 MED ORDER — CHLORPHENIRAMINE MALEATE 4 MG PO TABS: ORAL_TABLET | ORAL | Status: DC

## 2013-06-30 MED ORDER — FLUTICASONE PROPIONATE 50 MCG/ACT NA SUSP: 2.0000 | Freq: Every day | NASAL | Status: DC

## 2013-06-30 NOTE — Patient Instructions (Signed)
If you follow a strict reflux diet or use the nutribullet you can stop the omeprazole Stay on flonase daily and chlor trimeton 12mg  at bedtime Stop delsym Get teeth work accomplished, we will try to get you an appt for dental clinic referral Return as needed

## 2013-06-30 NOTE — Progress Notes (Signed)
Subjective:    Patient ID: Kellie [name], female    DOB: Jul 15, 1955, 58 y.o.   MRN: [id number]  HPI  58 yo female with cyclical cough    40/9/81 Follow up  Patient returns for a two-week followup for cough. Patient was seen last visit with flare for cyclical cough. She was treated with a prednisone taper along with aggressive reflux, and rhinitis prevention . Patient says that her cough did improve however, she did stop her regimen thinking that she only needed to take it for a week. We discussed her possible triggers for cough and a, prevention regimen to help manage these triggers. She does have, nasal congestion, postnasal drainage, and a dry cough. Has intermittent hoarseness, on and off. Patient denies any hemoptysis, orthopnea, PND, leg swelling, recent travel. Previous cyclical cough. Workup revealed a negative CT sinus in 2013, along with a negative. Chest x-ray. Patient is a never smoker. We reviewed her medications in detail with pt education.    06/30/2013 Chief Complaint  Patient presents with  . Follow-up    Pt states her cough has subsided since using the delsym, but does not like the way it makes her feel.  Pt c/o sinus congestion, itchy ears, postnasal drip, halitosis.     Overall cough is better.  Pt notes min cough two times a day. Cough now occ productive.  Pt notes some pndrip.  No mucus out of nose.  Notes some hoaseness.   Pt stopped chlor trimeton at night.  If sneezes or coughs will bring up mucus that beige.      Review of Systems  Constitutional:   No  weight loss, night sweats,  Fevers, chills, fatigue, or  lassitude.  HEENT:   No headaches,  Difficulty swallowing,  Tooth/dental problems, or  Sore throat,                No sneezing, itching, ear ache,  +nasal congestion, post nasal drip,   CV:  No chest pain,  Orthopnea, PND, swelling in lower extremities, anasarca, dizziness, palpitations, syncope.   GI  No heartburn, indigestion, abdominal pain,  nausea, vomiting, diarrhea, change in bowel habits, loss of appetite, bloody stools.   Resp:   No chest wall deformity  Skin: no rash or lesions.  GU: no dysuria, change in color of urine, no urgency or frequency.  No flank pain, no hematuria   MS:  No joint pain or swelling.  No decreased range of motion.  No back pain.  Psych:  No change in mood or affect. No depression or anxiety.  No memory loss.         Objective:   Physical Exam  GEN: A/Ox3; pleasant , NAD, well nourished   HEENT:  [place]/AT,  EACs-clear, TMs-wnl, NOSE-clear drainage, THROAT-clear, no lesions, no postnasal drip or exudate noted.  Significant periodontal disease NECK:  Supple w/ fair ROM; no JVD; normal carotid impulses w/o bruits; no thyromegaly or nodules palpated; no lymphadenopathy.  RESP  Clear  P & A; w/o, wheezes/ rales/ or rhonchi.no accessory muscle use, no dullness to percussion  CARD:  RRR, no m/r/g  , no peripheral edema, pulses intact, no cyanosis or clubbing.  GI:   Soft & nt; nml bowel sounds; no organomegaly or masses detected.  Musco: Warm bil, no deformities or joint swelling noted.   Neuro: alert, no focal deficits noted.    Skin: Warm, no lesions or rashes     Assessment & Plan:   Cough  Cyclical cough on the basis of reflux disease and postnasal drip syndrome now improved Plan Continue Flonase and chlorpheniramine daily Continue reflux diet Note patient has significant periodontal disease and this needs to be addressed   Updated Medication List Outpatient Encounter Prescriptions as of 06/30/2013  Medication Sig  . acyclovir (ZOVIRAX) 200 MG capsule Take 2 capsules (400 mg total) by mouth 2 (two) times daily.  [name] Kitchen ascorbic acid (VITAMIN C) 250 MG CHEW Chew 500 mg by mouth daily.  . Calcium Carbonate-Vitamin D (CALTRATE 600+D) 600-400 MG-UNIT per tablet Take 1 tablet by mouth 2 (two) times daily.  . Cyanocobalamin (B-12 PO) Take 2 tablets by mouth daily.   [name] Kitchen MAGNESIUM PO Take by  mouth daily.    [name] Kitchen omeprazole (PRILOSEC) 20 MG capsule Take one daily before meal  . Prenatal Vit-Fe Sulfate-FA (PRENATAL VITAMIN PO) Take 1 tablet by mouth daily.  [name] Kitchen UNABLE TO FIND Take 1 tablet by mouth daily. Med Name: Acidalpolis  . chlorpheniramine (CHLOR-TRIMETON) 4 MG tablet Take three at bedtime  . fluticasone (FLONASE) 50 MCG/ACT nasal spray Place 2 sprays into both nostrils daily.

## 2013-06-30 NOTE — Assessment & Plan Note (Addendum)
Cyclical cough on the basis of reflux disease and postnasal drip syndrome now improved Plan Continue Flonase and chlorpheniramine daily Continue reflux diet Note patient has significant periodontal disease and this needs to be addressed

## 2013-07-15 ENCOUNTER — Ambulatory Visit: Payer: No Typology Code available for payment source | Admitting: Physician Assistant

## 2013-07-31 ENCOUNTER — Other Ambulatory Visit: Payer: Self-pay | Admitting: Physician Assistant

## 2013-07-31 ENCOUNTER — Encounter: Payer: Self-pay | Admitting: Physician Assistant

## 2013-07-31 ENCOUNTER — Ambulatory Visit (INDEPENDENT_AMBULATORY_CARE_PROVIDER_SITE_OTHER): Payer: No Typology Code available for payment source | Admitting: Physician Assistant

## 2013-07-31 VITALS — BP 93/64 | HR 72 | Temp 98.0°F | Resp 14 | Ht 66.0 in | Wt 122.2 lb

## 2013-07-31 DIAGNOSIS — M719 Bursopathy, unspecified: Secondary | ICD-10-CM

## 2013-07-31 DIAGNOSIS — M67919 Unspecified disorder of synovium and tendon, unspecified shoulder: Secondary | ICD-10-CM

## 2013-07-31 NOTE — Progress Notes (Signed)
Pre visit review using our clinic review tool, if applicable. No additional management support is needed unless otherwise documented below in the visit note/SLS  

## 2013-07-31 NOTE — Progress Notes (Signed)
Subjective:     Patient ID: Kellie [name], female   DOB: 16-Apr-1956, 58 y.o.   MRN: [id number]   CC: right shoulder pain   HPI Pt is a pleasant right handed Caucasian 58 y/o female presenting today with right shoulder pain.  Pt c/o right shoulder pain that began about 6 mo ago. She recalls having right neck and shoulder tension that began before the shoulder pain. Denies any acute traumas or history of trauma to right shoulder, neck, or back. The pain was a gradual onset that is described as constant with intermittent bouts of weakness.  Denies tingling, stinging, or numbness. Reports the pain wakes her up at night, radiating from her shoulder down to her hand. She has been taking ibuprofen 400-600 mg daily with little to no relief. She rates the pain at its best 8 and worst 10 on a 1-10 scale. Pain affects the back and front of her right shoulder, and her right upper arm. Pt performs upper body exercises daily, this is not a new routine. She has had Frozen Shoulder on the left in 2006 with 2 surgeries that have been successful. Reports her right shoulder feels weak when holding onto the phone or steering wheel when driving.     Review of Systems  Musculoskeletal: Negative for back pain, joint swelling, neck pain and neck stiffness.  Skin: Negative for color change.  Neurological: Positive for weakness.     Current Outpatient Prescriptions on File Prior to Visit  Medication Sig Dispense Refill  . acyclovir (ZOVIRAX) 200 MG capsule Take 2 capsules (400 mg total) by mouth 2 (two) times daily.  60 capsule  5  . ascorbic acid (VITAMIN C) 250 MG CHEW Chew 500 mg by mouth daily.      . Calcium Carbonate-Vitamin D (CALTRATE 600+D) 600-400 MG-UNIT per tablet Take 1 tablet by mouth 2 (two) times daily.      . chlorpheniramine (CHLOR-TRIMETON) 4 MG tablet Take three at bedtime  30 tablet  0  . Cyanocobalamin (B-12 PO) Take 2 tablets by mouth daily.       . fluticasone (FLONASE) 50 MCG/ACT nasal spray  Place 2 sprays into both nostrils daily.  16 g  4  . MAGNESIUM PO Take by mouth daily.        . Prenatal Vit-Fe Sulfate-FA (PRENATAL VITAMIN PO) Take 1 tablet by mouth daily.      [name] Kitchen UNABLE TO FIND Take 1 tablet by mouth daily. Med Name: Acidalpolis Probiotic       No current facility-administered medications on file prior to visit.   Past Medical History  Diagnosis Date  . Hyperlipidemia   . Allergic rhinitis 11/08/2010  . Osteoporosis 11/21/2011  . Asthma   . Bronchitis   . Anxiety   . Mitral valve prolapse   . Herpes simplex of female genitalia         Objective:   Physical Exam  Constitutional: She is oriented to person, place, and time. Vital signs are normal. She appears well-developed and well-nourished. No distress.  HENT:  Head: Normocephalic and atraumatic.  Pulmonary/Chest: Effort normal. No respiratory distress.  Musculoskeletal: She exhibits no edema.       Right shoulder: She exhibits tenderness, pain and decreased strength. She exhibits normal range of motion, no swelling, no crepitus, no deformity and no laceration.  Right shoulder: active ROM were complete.   FLXN: pain in posterior shoulder and lateral upper arm with active and resisted ROM.  EXT: pain in anterior  shoulder with resisted ROM. No pain with active ROM.  ABD: pain at mid-way through active ROM. Weakness and pain with resisted ROM.  ADD: no pain with active ROM.  IROT: pain with active ROM. Weakness and pain with resisted ROM.    Neers test -negative for bicipital impingement.  Empty can test- right sided weakness and pain Drop Arm test- No sudden drop.      Neurological: She is alert and oriented to person, place, and time.  Skin: No rash noted. No erythema.  Psychiatric: She has a normal mood and affect.       Assessment:      1. Rotator Cuff Dysfunction 2. Proximal biceps tendonitis     Plan:     1. MRI without contrast of right shoulder.  2. Pt advised to alternate tylenol with  ibuprofen for pain relief.  3. Will consider orthopedist based on MRI results.  02.06.2015  [place] Maneh Sieben, PA-S.       I have personally seen and evaluated the patient and I agree with the student's assessment/plan.  Brunetta Jeans, PA-C

## 2013-07-31 NOTE — Patient Instructions (Signed)
Alternate Tylenol and Ibuprofen for pain.  Continue stretching exercises.  Apply topical Salon Pas or Aspercreme to Right shoulder.  I will call you with the results of your imaging once they are present.  If you pain severely worsens, or if you are unable to move your arm, please proceed to the ER.

## 2013-08-01 ENCOUNTER — Ambulatory Visit (HOSPITAL_BASED_OUTPATIENT_CLINIC_OR_DEPARTMENT_OTHER): Payer: No Typology Code available for payment source

## 2013-08-01 DIAGNOSIS — M67919 Unspecified disorder of synovium and tendon, unspecified shoulder: Secondary | ICD-10-CM | POA: Insufficient documentation

## 2013-08-01 NOTE — Assessment & Plan Note (Signed)
Pain and weakness with resistance.  Will obtain MRI R Shoulder W/O Contrast.  Alternate tylenol and ibuprofen.  Avoid heavy lifting or overhead movements of RUE. Will likely need referral to orthopedist.

## 2013-08-04 ENCOUNTER — Telehealth: Payer: Self-pay | Admitting: Physician Assistant

## 2013-08-04 ENCOUNTER — Ambulatory Visit (INDEPENDENT_AMBULATORY_CARE_PROVIDER_SITE_OTHER): Payer: No Typology Code available for payment source

## 2013-08-04 DIAGNOSIS — M67919 Unspecified disorder of synovium and tendon, unspecified shoulder: Secondary | ICD-10-CM

## 2013-08-04 DIAGNOSIS — M25569 Pain in unspecified knee: Secondary | ICD-10-CM

## 2013-08-04 DIAGNOSIS — M7500 Adhesive capsulitis of unspecified shoulder: Secondary | ICD-10-CM

## 2013-08-04 NOTE — Telephone Encounter (Signed)
Patient's MRI reveals no evidence of rotator cuff muscle tear but does show evidence of inflammation in one of her rotator cuff muscles.  It also reveals possible evidence of the beginning of frozen shoulder.  Patient should schedule an appointment with her orthopedist if her symptoms are not improving with conservative measures.  If patient no longer followed by Orthopedics, I will happily make a new referral for her.

## 2013-08-06 ENCOUNTER — Telehealth: Payer: Self-pay | Admitting: Family

## 2013-08-06 DIAGNOSIS — M25519 Pain in unspecified shoulder: Secondary | ICD-10-CM

## 2013-08-06 NOTE — Telephone Encounter (Signed)
Patient informed; please make new referral for Orthop per pt/SLS

## 2013-08-06 NOTE — Telephone Encounter (Signed)
Mri results

## 2013-08-06 NOTE — Telephone Encounter (Signed)
Referral placed.  Will be contacted by specialist's office.

## 2013-08-07 NOTE — Telephone Encounter (Signed)
Notified pt. She states she tried Aleve yesterday and it did not seem to help.  States ibuprofen worked better. Advised her ok to continue ibuprofen but no longer than 2 weeks.  Pt voices understanding.

## 2013-08-07 NOTE — Telephone Encounter (Signed)
Notified pt. She states she spoke with someone yesterday and will have to delay appt with sports medicine as she is having to re-apply for financial aid that will expire on 08/21/13.  Pt wants to know what she can take other than ibuprofen for the inflammation? Can she alternate with naprosyn? Wants to know if she can take anti-inflammatory x 2 weeks?  Please advise.

## 2013-08-07 NOTE — Telephone Encounter (Signed)
I would recommend aleve.  Can alternate with tylenol.  Ok to use aleve for 1-2 weeks, but I would switch to tylenol after 2 weeks.

## 2013-08-07 NOTE — Telephone Encounter (Signed)
Rotator cuff looks good.  Some possible mild adhesive capsulitis ( AKA Frozen shoulder).  This can cause pain and decreased range of motion.  I will refer to sports medicine for further evaluation.

## 2013-08-17 ENCOUNTER — Other Ambulatory Visit: Payer: Self-pay | Admitting: Family

## 2013-08-17 ENCOUNTER — Telehealth: Payer: Self-pay | Admitting: Family

## 2013-08-17 DIAGNOSIS — R41840 Attention and concentration deficit: Secondary | ICD-10-CM

## 2013-08-17 DIAGNOSIS — Z1231 Encounter for screening mammogram for malignant neoplasm of breast: Secondary | ICD-10-CM

## 2013-08-17 NOTE — Telephone Encounter (Signed)
I am not sure what she is requesting.  Lets bring her in for a visit to discuss.

## 2013-08-17 NOTE — Telephone Encounter (Signed)
Spoke with pt. She states Marya Amsler (therapist) told her to contact us to arrange referral to ?neuropsychiatrist for further evaluation of her ADHD workup. Pt states the counselor told her she wants to determine if pt's symptoms could be coming from a previous head injury. Pt states she had already spoken to [place] about this.  I saw a phone note from 04/2013 [id number] but I do not see a referral in the system.  Should there be one?

## 2013-08-17 NOTE — Telephone Encounter (Signed)
Requesting referral for neuro testing?? Pt is unsure of what the test is.

## 2013-08-18 ENCOUNTER — Ambulatory Visit (HOSPITAL_COMMUNITY): Payer: No Typology Code available for payment source

## 2013-08-18 NOTE — Telephone Encounter (Signed)
We made appointment back in December and she refused to schedule on 12/2 with their office.  Will place referral again.

## 2013-08-20 ENCOUNTER — Ambulatory Visit (HOSPITAL_COMMUNITY): Payer: No Typology Code available for payment source

## 2013-09-07 ENCOUNTER — Telehealth: Payer: Self-pay | Admitting: *Deleted

## 2013-09-07 NOTE — Telephone Encounter (Signed)
Pt called stating she thought Einar Pheasant told her at her last visit that her MRI was to include her shoulder and her neck.  Pt had MRI of her shoulder on 07/31/13 and was told there was no order for her neck.  Pt has not been able to see ortho yet as her financial assistance through [facility] has run out and she states she is still waiting to hear back from someone about re-instatement. Once she is re-instated she states that she will be able to arrange ortho appt. Pt wants to know why we did not include the MRI of her neck as she thought Provider told her we would include both?  Please advise.

## 2013-09-07 NOTE — Telephone Encounter (Signed)
Patient's symptoms were mostly stemming from her rotator cuff muscles -- pain and decreased ROM with weakness.  That is why we obtained the MRI of her shoulder joint.  I do not remember stating we were going to get an MRI of her neck.  If i did, I apologize.  I do feel that the MRI of her shoulder was all that was required.

## 2013-09-07 NOTE — Telephone Encounter (Signed)
Notified pt. Pt was upset and wants to know what is causing her worsening symptoms and how do we know it's not coming from her neck.  Advised pt that MRI of shoulder revealed most likely cause of her symptoms and the fact that she has had to delay ortho referral probably has not improved her symptoms. Pt requests copy of MRI film. Spoke to radiology and they will have copy ready to pick up tomorrow; notified pt and she voices understanding.

## 2013-10-22 ENCOUNTER — Encounter (HOSPITAL_BASED_OUTPATIENT_CLINIC_OR_DEPARTMENT_OTHER): Payer: Self-pay | Admitting: Emergency Medicine

## 2013-10-22 ENCOUNTER — Emergency Department (HOSPITAL_BASED_OUTPATIENT_CLINIC_OR_DEPARTMENT_OTHER)
Admission: EM | Admit: 2013-10-22 | Discharge: 2013-10-22 | Disposition: A | Discharge status: Home / Self Care | Payer: Self-pay | Attending: Emergency Medicine | Admitting: Emergency Medicine

## 2013-10-22 DIAGNOSIS — J45909 Unspecified asthma, uncomplicated: Secondary | ICD-10-CM | POA: Insufficient documentation

## 2013-10-22 DIAGNOSIS — Z8679 Personal history of other diseases of the circulatory system: Secondary | ICD-10-CM | POA: Insufficient documentation

## 2013-10-22 DIAGNOSIS — IMO0002 Reserved for concepts with insufficient information to code with codable children: Secondary | ICD-10-CM | POA: Insufficient documentation

## 2013-10-22 DIAGNOSIS — M719 Bursopathy, unspecified: Principal | ICD-10-CM | POA: Insufficient documentation

## 2013-10-22 DIAGNOSIS — M810 Age-related osteoporosis without current pathological fracture: Secondary | ICD-10-CM | POA: Insufficient documentation

## 2013-10-22 DIAGNOSIS — Z8619 Personal history of other infectious and parasitic diseases: Secondary | ICD-10-CM | POA: Insufficient documentation

## 2013-10-22 DIAGNOSIS — Z79899 Other long term (current) drug therapy: Secondary | ICD-10-CM | POA: Insufficient documentation

## 2013-10-22 DIAGNOSIS — M7500 Adhesive capsulitis of unspecified shoulder: Secondary | ICD-10-CM | POA: Insufficient documentation

## 2013-10-22 DIAGNOSIS — Z8659 Personal history of other mental and behavioral disorders: Secondary | ICD-10-CM | POA: Insufficient documentation

## 2013-10-22 DIAGNOSIS — E785 Hyperlipidemia, unspecified: Secondary | ICD-10-CM | POA: Insufficient documentation

## 2013-10-22 DIAGNOSIS — M67919 Unspecified disorder of synovium and tendon, unspecified shoulder: Secondary | ICD-10-CM | POA: Insufficient documentation

## 2013-10-22 DIAGNOSIS — M7590 Shoulder lesion, unspecified, unspecified shoulder: Secondary | ICD-10-CM

## 2013-10-22 MED ORDER — PROMETHAZINE HCL 25 MG PO TABS: 25.0000 mg | ORAL_TABLET | Freq: Four times a day (QID) | ORAL | Status: DC | PRN

## 2013-10-22 MED ORDER — IBUPROFEN 800 MG PO TABS: 800.0000 mg | ORAL_TABLET | Freq: Three times a day (TID) | ORAL | Status: DC | PRN

## 2013-10-22 MED ORDER — OXYCODONE-ACETAMINOPHEN 5-325 MG PO TABS: 1.0000 | ORAL_TABLET | ORAL | Status: DC | PRN

## 2013-10-22 MED ORDER — ONDANSETRON 4 MG PO TBDP
4.0000 mg | ORAL_TABLET | Freq: Once | ORAL | Status: AC
  Administered 2013-10-22: 4 mg via ORAL
  Filled 2013-10-22: qty 1

## 2013-10-22 MED ORDER — IBUPROFEN 800 MG PO TABS
800.0000 mg | ORAL_TABLET | Freq: Once | ORAL | Status: AC
  Administered 2013-10-22: 800 mg via ORAL
  Filled 2013-10-22: qty 1

## 2013-10-22 MED ORDER — OXYCODONE-ACETAMINOPHEN 5-325 MG PO TABS
1.0000 | ORAL_TABLET | Freq: Once | ORAL | Status: AC
  Administered 2013-10-22: 1 via ORAL
  Filled 2013-10-22: qty 1

## 2013-10-22 NOTE — Discharge Instructions (Signed)
Adhesive Capsulitis Sometimes the shoulder becomes stiff and is painful to move. Some people say it feels as if the shoulder is frozen in place. Because of this, the condition is called "frozen shoulder." Its medical name is adhesive capsulitis.  The shoulder joint is made up of strong connective tissue that attaches the ball of the humerus to the shallow shoulder socket. This strong connective tissue is called the joint capsule. This tissue can become stiff and swollen. That is when adhesive capsulitis sets in. CAUSES  It is not always clear just what the cause adhesive capsulitis. Possibilities include:  Injury to the shoulder joint.  Strain. This is a repetitive injury brought about by overuse.  Lack of use. Perhaps your arm or hand was otherwise injured. It might have been in a sling for awhile. Or perhaps you were not using it to avoid pain.  Referred pain. This is a sort of trick the body plays. You feel pain in the shoulder. But, the pain actually comes from an injury somewhere else in the body.  Long-standing health problems. Several diseases can cause adhesive capsulitis. They include diabetes, heart disease, stroke, thyroid problems, rheumatoid arthritis and lung disease.  Being a women older than 40. Anyone can develop adhesive capsulitis but it is most common in women in this age group. SYMPTOMS   Pain.  It occurs when the arm is moved.  Parts of the shoulder might hurt if they are touched.  Pain is worse at night or when resting.  Soreness. It might not be strong enough to be called pain. But, the shoulder aches.  The shoulder does not move freely.  Muscle spasms.  Trouble sleeping because of shoulder ache or pain. DIAGNOSIS  To decide if you have adhesive capsulitis, your healthcare provider will probably:  Ask about symptoms you have noticed.  Ask about your history of joint pain and anything that might have caused the pain.  Ask about your overall  health.  Use hands to feel your shoulder and neck.  Ask you to move your shoulder in specific directions. This may indicate the origin of the pain.  Order imaging tests; pictures of the shoulder. They help pinpoint the source of the problem. An X-ray might be used. For more detail, an MRI is often used. An MRI details the tendons, muscles and ligaments as well as the joint. TREATMENT  Adhesive capsulitis can be treated several ways. Most treatments can be done in a clinic or in your healthcare provider's office. Be sure to discuss the different options with your caregiver. They include:  Physical therapy. You will work on specific exercises to get your shoulder moving again. The exercises usually involve stretching. A physical therapist (a caregiver with special training) can show you what to do and what not to do. The exercises will need to be done daily.  Medication.  Over-the-counter medicines may relieve pain and inflammation (the body's way of reacting to injury or infection).  Corticosteroids. These are stronger drugs to reduce pain and inflammation. They are given by injection (shots) into the shoulder joint. Frequent treatment is not recommended.  Muscle relaxants. Medication may be prescribed to ease muscle spasms.  Treatment of underlying conditions. This means treating another condition that is causing your shoulder problem. This might be a rotator cuff (tendon) problem  Shoulder manipulation. The shoulder will be moved by your healthcare provider. You would be under general anesthesia (given a drug that puts you to sleep). You would not feel anything. Sometimes   the joint will be injected with salt water (saline) at high pressure to break down internal scarring in the joint capsule.  Surgery. This is rarely needed. It may be suggested in advanced cases after all other treatment has failed. PROGNOSIS  In time, most people recover from adhesive capsulitis. Sometimes, however, the  pain goes away but full movement of the shoulder does not return.  HOME CARE INSTRUCTIONS   Take any pain medications recommended by your healthcare provider. Follow the directions carefully.  If you have physical therapy, follow through with the therapist's suggestions. Be sure you understand the exercises you will be doing. You should understand:  How often the exercises should be done.  How many times each exercise should be repeated.  How long they should be done.  What other activities you should do, or not do.  That you should warm up before doing any exercise. Just 5 to 10 minutes will help. Small, gentle movements should get your shoulder ready for more.  Avoid high-demand exercise that involves your shoulder such as throwing. This type of exercise can make pain worse.  Consider using cold packs. Cold may ease swelling and pain. Ask your healthcare provider if a cold pack might help you. If so, get directions on how and when to use them. SEEK MEDICAL CARE IF:   You have any questions about your medications.  Your pain continues to increase. Document Released: 04/08/2009 Document Revised: 09/03/2011 Document Reviewed: 04/08/2009 [facility] Patient Information 2014 [place], [place].  Rotator Cuff Tendinitis  Rotator cuff tendinitis is inflammation of the tough, cord-like bands that connect muscle to bone (tendons) in your rotator cuff. Your rotator cuff is the collection of all the muscles and tendons that connect your arm to your shoulder. Your rotator cuff holds the head of your upper arm bone (humerus) in the cup (fossa) of your shoulder blade (scapula). CAUSES Rotator cuff tendinitis is usually caused by overusing the joint involved.  SIGNS AND SYMPTOMS  Deep ache in the shoulder also felt on the outside upper arm over the shoulder muscle.  Point tenderness over the area that is injured.  Pain comes on gradually and becomes worse with lifting the arm to the side  (abduction) or turning it inward (internal rotation).  May lead to a chronic tear: When a rotator cuff tendon becomes inflamed, it runs the risk of losing its blood supply, causing some tendon fibers to die. This increases the risk that the tendon can fray and partially or completely tear. DIAGNOSIS Rotator cuff tendinitis is diagnosed by taking a medical history, performing a physical exam, and reviewing results of imaging exams. The medical history is useful to help determine the type of rotator cuff injury. The physical exam will include looking at the injured shoulder, feeling the injured area, and watching you do range-of-motion exercises. X-ray exams are typically done to rule out other causes of shoulder pain, such as fractures. MRI is the imaging exam usually used for significant shoulder injuries. Sometimes a dye study called CT arthrogram is done, but it is not as widely used as MRI. In some institutions, special ultrasound tests may also be used to aid in the diagnosis. TREATMENT  Less Severe Cases  Use of a sling to rest the shoulder for a short period of time. Prolonged use of the sling can cause stiffness, weakness, and loss of motion of the shoulder joint.  Anti-inflammatory medicines, such as ibuprofen or naproxen sodium, may be prescribed. More Severe Cases  Physical therapy.  Use of steroid injections into the shoulder joint.  Surgery. HOME CARE INSTRUCTIONS   Use a sling or splint until the pain decreases. Prolonged use of the sling can cause stiffness, weakness, and loss of motion of the shoulder joint.  Apply ice to the injured area:  Put ice in a plastic bag.  Place a towel between your skin and the bag.  Leave the ice on for 20 minutes, 2 3 times a day.  Try to avoid use other than gentle range of motion while your shoulder is painful. Use the shoulder and exercise only as directed by your health care provider. Stop exercises or range of motion if pain or  discomfort increases, unless directed otherwise by your health care provider.  Only take over-the-counter or prescription medicines for pain, discomfort, or fever as directed by your health care provider.  If you were given a shoulder sling and straps (immobilizer), do not remove it except as directed, or until you see a health care provider for a follow-up exam. If you need to remove it, move your arm as little as possible or as directed.  You may want to sleep on several pillows at night to lessen swelling and pain. SEEK IMMEDIATE MEDICAL CARE IF:   Your shoulder pain increases or new pain develops in your arm, hand, or fingers and is not relieved with medicines.  You have new, unexplained symptoms, especially increased numbness in the hands or loss of strength.  You develop any worsening of the problems that brought you in for care.  Your arm, hand, or fingers are numb or tingling.  Your arm, hand, or fingers are swollen, painful, or turn white or blue. MAKE SURE YOU:  Understand these instructions.  Will watch your condition.  Will get help right away if you are not doing well or get worse. Document Released: 09/01/2003 Document Revised: 04/01/2013 Document Reviewed: 01/21/2013 [facility] Patient Information 2014 [place], [place].   RICE: Routine Care for Injuries The routine care of many injuries includes Rest, Ice, Compression, and Elevation (RICE). HOME CARE INSTRUCTIONS  Rest is needed to allow your body to heal. Routine activities can usually be resumed when comfortable. Injured tendons and bones can take up to 6 weeks to heal. Tendons are the cord-like structures that attach muscle to bone.  Ice following an injury helps keep the swelling down and reduces pain.  Put ice in a plastic bag.  Place a towel between your skin and the bag.  Leave the ice on for 15-20 minutes, 03-04 times a day. Do this while awake, for the first 24 to 48 hours. After that, continue as  directed by your caregiver.  Compression helps keep swelling down. It also gives support and helps with discomfort. If an elastic bandage has been applied, it should be removed and reapplied every 3 to 4 hours. It should not be applied tightly, but firmly enough to keep swelling down. Watch fingers or toes for swelling, bluish discoloration, coldness, numbness, or excessive pain. If any of these problems occur, remove the bandage and reapply loosely. Contact your caregiver if these problems continue.  Elevation helps reduce swelling and decreases pain. With extremities, such as the arms, hands, legs, and feet, the injured area should be placed near or above the level of the heart, if possible. SEEK IMMEDIATE MEDICAL CARE IF:  You have persistent pain and swelling.  You develop redness, numbness, or unexpected weakness.  Your symptoms are getting worse rather than improving after several days. These  symptoms may indicate that further evaluation or further X-rays are needed. Sometimes, X-rays may not show a small broken bone (fracture) until 1 week or 10 days later. Make a follow-up appointment with your caregiver. Ask when your X-ray results will be ready. Make sure you get your X-ray results. Document Released: 09/23/2000 Document Revised: 09/03/2011 Document Reviewed: 11/10/2010 [facility] Patient Information 2014 [place], [place].

## 2013-10-22 NOTE — ED Notes (Signed)
C/o neck pain "for a few months"-HA x 7-8 days-pain to right arm since Feb-hx of "frozen shoulder in Feb with MRI"-pt NAD

## 2013-10-22 NOTE — ED Provider Notes (Signed)
TIME SEEN: 11:20 AM  CHIEF COMPLAINT: Right shoulder pain  HPI: Patient is a 58 y.o. F who is right-hand dominant with history of hyperlipidemia, asthma, anxiety who presents to the emergency department with complaints of several months of right shoulder pain that is progressively worsening. She states the pain radiates down her arm and into her right lateral neck and posterior head. She has had vomiting. She was seen by her primary care physician, Dr. Elyn Aquas, and had an MRI of her shoulder which showed supraspinatus tendinosis with no rotator cuff tear, no labral or biceps tendon tear, a minimal amount of adhesive capsulitis. She denies any new injury. No numbness or focal weakness. Patient states she is here because she cannot control her pain at home but she has not tried taking any medication. She states "what is going to happen to me if I keep having inflammation like this in my body?"  ROS: See HPI Constitutional: no fever  Eyes: no drainage  ENT: no runny nose   Cardiovascular:  no chest pain  Resp: no SOB  GI: no vomiting GU: no dysuria Integumentary: no rash  Allergy: no hives  Musculoskeletal: no leg swelling  Neurological: no slurred speech ROS otherwise negative  PAST MEDICAL HISTORY/PAST SURGICAL HISTORY:  Past Medical History  Diagnosis Date  . Hyperlipidemia   . Allergic rhinitis 11/08/2010  . Osteoporosis 11/21/2011  . Asthma   . Bronchitis   . Anxiety   . Mitral valve prolapse   . Herpes simplex of female genitalia     MEDICATIONS:  Prior to Admission medications   Medication Sig Start Date End Date Taking? Authorizing Provider  acyclovir (ZOVIRAX) 200 MG capsule Take 2 capsules (400 mg total) by mouth 2 (two) times daily. 11/04/12   Debbrah Alar, NP  ascorbic acid (VITAMIN C) 250 MG CHEW Chew 500 mg by mouth daily.    Historical Provider, MD  Calcium Carbonate-Vitamin D (CALTRATE 600+D) 600-400 MG-UNIT per tablet Take 1 tablet by mouth 2 (two) times  daily.    Historical Provider, MD  chlorpheniramine (CHLOR-TRIMETON) 4 MG tablet Take three at bedtime 06/30/13   Elsie Stain, MD  Cyanocobalamin (B-12 PO) Take 2 tablets by mouth daily.     Historical Provider, MD  fluticasone (FLONASE) 50 MCG/ACT nasal spray Place 2 sprays into both nostrils daily. 06/30/13   Elsie Stain, MD  MAGNESIUM PO Take by mouth daily.      Historical Provider, MD  Prenatal Vit-Fe Sulfate-FA (PRENATAL VITAMIN PO) Take 1 tablet by mouth daily.    Historical Provider, MD  UNABLE TO FIND Take 1 tablet by mouth daily. Med Name: Acidalpolis Probiotic    Historical Provider, MD    ALLERGIES:  Allergies  Allergen Reactions  . Hydrocodone Nausea And Vomiting  . Codeine Nausea And Vomiting  . Morphine And Related Nausea And Vomiting    SOCIAL HISTORY:  History  Substance Use Topics  . Smoking status: Never Smoker   . Smokeless tobacco: Never Used  . Alcohol Use: No    FAMILY HISTORY: Family History  Problem Relation Age of Onset  . Kidney cancer Mother 68  . COPD Mother   . Cancer Mother     renal  . Kidney disease Mother   . Heart disease Father   . COPD Sister   . Heart disease Brother   . Heart attack Brother   . Heart disease Paternal Uncle   . Colon cancer Neg Hx   . Rectal cancer  Neg Hx   . Stomach cancer Neg Hx   . Colon polyps Neg Hx   . Diabetes      neice  . Thyroid disease      neice    EXAM: BP 117/64  Pulse 72  Temp(Src) 97.6 F (36.4 C) (Oral)  Resp 16  SpO2 100% CONSTITUTIONAL: Alert and oriented and responds appropriately to questions. Well-appearing; well-nourished HEAD: Normocephalic EYES: Conjunctivae clear, PERRL ENT: normal nose; no rhinorrhea; moist mucous membranes; pharynx without lesions noted NECK: Supple, no meningismus, no LAD; no midline spinal tenderness or step-off or deformity CARD: RRR; S1 and S2 appreciated; no murmurs, no clicks, no rubs, no gallops RESP: Normal chest excursion without splinting or  tachypnea; breath sounds clear and equal bilaterally; no wheezes, no rhonchi, no rales,  ABD/GI: Normal bowel sounds; non-distended; soft, non-tender, no rebound, no guarding BACK:  The back appears normal and is non-tender to palpation, there is no CVA tenderness EXT: Patient is tender to palpation diffusely over her right shoulder with limited range of motion secondary to pain, normal grip strength, 2+ radial pulses bilaterally, sensation to light touch intact diffusely, no joint effusion or bony deformity or ecchymosis, no warmth or erythema or induration or fluctuance, Normal ROM in all joints; non-tender to palpation; no edema; normal capillary refill; no cyanosis    SKIN: Normal color for age and race; warm NEURO: Moves all extremities equally; sensation to light touch intact diffusely, 2+ deep tendon reflexes in bilateral upper and lower extremities PSYCH: Patient appears very anxious. Her behavior is histrionic. Grooming and personal hygiene are appropriate.  MEDICAL DECISION MAKING: Patient here with right shoulder pain that radiates down her right arm and into her neck. She has no symptoms of radiculopathy and is neurologically intact. She has had an MRI 2 months ago which showed supraspinatus tendinosis and adhesive capsulitis. She is requesting another MRI today and states she feel she needs surgery emergently. She states she is worried that the inflammation in her shoulder may affect her entire body. Have attempted to reassure the patient that this is not a life-threatening illness and I feel that she is safe to be discharged him with pain medication. Have also discussed with patient that she needs to followup with her primary care physician as the emergency department is not an appropriate place to manage chronic pain. Have given her return precautions and supportive care instructions. I am not concerned for spinal stenosis, cervical myelopathy, septic arthritis and explained this to the  patient at length as well as her friend is at bedside. We'll discharge with prescription for ibuprofen, Phenergan and Percocet.       [place], DO 10/22/13 1139

## 2013-10-26 ENCOUNTER — Encounter: Payer: Self-pay | Admitting: Family Medicine

## 2013-10-26 ENCOUNTER — Ambulatory Visit (INDEPENDENT_AMBULATORY_CARE_PROVIDER_SITE_OTHER): Payer: Self-pay | Admitting: Family Medicine

## 2013-10-26 VITALS — BP 108/77 | HR 84 | Ht 66.0 in | Wt 120.0 lb

## 2013-10-26 DIAGNOSIS — M25511 Pain in right shoulder: Secondary | ICD-10-CM

## 2013-10-26 DIAGNOSIS — M25519 Pain in unspecified shoulder: Secondary | ICD-10-CM

## 2013-10-26 MED ORDER — METHYLPREDNISOLONE ACETATE 40 MG/ML IJ SUSP
40.0000 mg | Freq: Once | INTRAMUSCULAR | Status: AC
  Administered 2013-10-26: 40 mg via INTRA_ARTICULAR

## 2013-10-26 NOTE — Patient Instructions (Signed)
You have a frozen shoulder (adhesive capsulitis), a buildup of scar tissue that limits motion of the shoulder joint. Limit lifting and overhead activities as much as possible. Heat 15 minutes at a time 3-4 times a day may help with movement and stiffness. Tylenol and/or aleve as needed for pain and inflammation. Contact your PCP if you need pain medication refills. Steroid injections in a series have been shown to help with pain and motion - we went ahead with this today. Codman exercises (pendulum, wall walking or table slides, arm circles) - do 3 sets of 10 twice a day of each of these. Physical therapy for rotator cuff strengthening is a consideration once you are out of the painful phase Follow up in 6 weeks

## 2013-10-27 ENCOUNTER — Encounter: Payer: Self-pay | Admitting: Family Medicine

## 2013-10-27 DIAGNOSIS — M25511 Pain in right shoulder: Secondary | ICD-10-CM | POA: Insufficient documentation

## 2013-10-27 NOTE — Assessment & Plan Note (Signed)
msk u/s reassuring today that she did not cause a new tear in rotator cuff since her MRI in February.  Exam consistent with adhesive capsulitis which is also very painful in freezing phase.  She may have a smaller element of radiculopathy but capsulitis her predominant issue.  Discussed options - will start codman exercises.  Avoid lifting, overhead activities when possible.  Combination injection given today - can repeat intraarticular component 2 times every 6 weeks.  Consider PT in future.  F/u in 6 weeks.  After informed written consent, patient was seated on exam table. Right shoulder was prepped with alcohol swab and utilizing posterior approach, patient's right shoulder was injected with 6:2 marcaine:depomedrol with half in the subacromial space and half in glenohumeral space.  Patient tolerated the procedure well without immediate complications.
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
## 2013-11-02 ENCOUNTER — Encounter: Payer: Self-pay | Admitting: Physician Assistant

## 2013-11-02 ENCOUNTER — Ambulatory Visit (INDEPENDENT_AMBULATORY_CARE_PROVIDER_SITE_OTHER): Payer: Self-pay | Admitting: Physician Assistant

## 2013-11-02 ENCOUNTER — Telehealth: Payer: Self-pay | Admitting: Family

## 2013-11-02 VITALS — BP 88/55 | HR 72 | Temp 98.3°F | Resp 14 | Ht 66.0 in | Wt 118.2 lb

## 2013-11-02 DIAGNOSIS — M62838 Other muscle spasm: Secondary | ICD-10-CM

## 2013-11-02 DIAGNOSIS — M7500 Adhesive capsulitis of unspecified shoulder: Secondary | ICD-10-CM

## 2013-11-02 MED ORDER — CYCLOBENZAPRINE HCL 10 MG PO TABS: 10.0000 mg | ORAL_TABLET | Freq: Every day | ORAL | Status: DC

## 2013-11-02 NOTE — Telephone Encounter (Signed)
OK with me.

## 2013-11-02 NOTE — Telephone Encounter (Signed)
Fine, if Kellie [name] is amenable to this. I am also content if Kellie [name] wishes to keep her as a patient.

## 2013-11-02 NOTE — Assessment & Plan Note (Signed)
R shoulder.  Diagnosed via MRI.  Recent intraarticular steroid injection.  Will try to limit Percocet as her BP is at the very low end of normal.  Aleve to be used instead for mild-moderate pain.  Continue ROM exercises given by Sports Medicine.  Patient requesting to see Orthopedist.  Referral had already been placed.  Patient is responsible for calling and scheduling an appointment.  Patient is aware.

## 2013-11-02 NOTE — Telephone Encounter (Signed)
Pt is requesting to switch from Kellie [name] to Kellie [name]. Please advise.

## 2013-11-02 NOTE — Progress Notes (Signed)
Pre visit review using our clinic review tool, if applicable. No additional management support is needed unless otherwise documented below in the visit note/SLS  

## 2013-11-02 NOTE — Patient Instructions (Signed)
Please STOP Percocet.  I feel it may be causing the lowering of your blood pressure.  Stay well-hydrated.  Try to take Aleve for pain.  Take a Flexeril at bedtime.  Follow-up with Orthopedist. You will need to set up this appointment as we have already previously placed a referral.
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
## 2013-11-09 NOTE — Telephone Encounter (Signed)
Informed patient that it was ok to switch over to [facility]

## 2013-11-18 ENCOUNTER — Ambulatory Visit (INDEPENDENT_AMBULATORY_CARE_PROVIDER_SITE_OTHER): Payer: Self-pay | Admitting: *Deleted

## 2013-11-18 VITALS — BP 116/70 | HR 78 | Resp 14 | Wt 116.0 lb

## 2013-11-18 DIAGNOSIS — Z0130 Encounter for examination of blood pressure without abnormal findings: Secondary | ICD-10-CM

## 2013-11-18 DIAGNOSIS — Z136 Encounter for screening for cardiovascular disorders: Secondary | ICD-10-CM

## 2013-11-18 NOTE — Progress Notes (Signed)
Pt stated she felt weak and was concerned about some weight loss. BP was in normal range. Advised pt to schedule appt to see Provider to address additional concerns and she voices understanding.

## 2013-11-24 ENCOUNTER — Ambulatory Visit (INDEPENDENT_AMBULATORY_CARE_PROVIDER_SITE_OTHER): Payer: Self-pay | Admitting: Orthopedic Surgery

## 2013-11-24 ENCOUNTER — Ambulatory Visit: Payer: Self-pay

## 2013-11-24 ENCOUNTER — Encounter: Payer: Self-pay | Admitting: Orthopedic Surgery

## 2013-11-24 VITALS — BP 107/68 | Ht 65.0 in | Wt 118.0 lb

## 2013-11-24 DIAGNOSIS — M7501 Adhesive capsulitis of right shoulder: Secondary | ICD-10-CM

## 2013-11-24 DIAGNOSIS — M75100 Unspecified rotator cuff tear or rupture of unspecified shoulder, not specified as traumatic: Secondary | ICD-10-CM

## 2013-11-24 DIAGNOSIS — M7500 Adhesive capsulitis of unspecified shoulder: Secondary | ICD-10-CM

## 2013-11-24 DIAGNOSIS — M25519 Pain in unspecified shoulder: Secondary | ICD-10-CM

## 2013-11-24 DIAGNOSIS — S43429A Sprain of unspecified rotator cuff capsule, initial encounter: Secondary | ICD-10-CM

## 2013-11-24 NOTE — Progress Notes (Signed)
Patient ID: Kellie [name], female   DOB: 08-18-1955, 58 y.o.   MRN: [id number]  Chief Complaint  Patient presents with  . Shoulder Pain    Left shoulder pain. Consult C. Hassell Done    Consult has been requested for this 58 year old female with a interesting history of shoulder pain. The patient initially presented with no trauma and pain in the right shoulder. She had an MRI which showed mild adhesive capsulitis and mild tendinitis in her rotator cuff without tear. She was placed on a home exercise program and does not report really having any improvement but she had not experienced a loss of motion at that time. She had an injection in the shoulder and following that fell again and started having pain and loss of motion in the shoulder and now presents with sharp throbbing stabbing aching pain radiating into her right arm with catching stiffness numbness in her right upper extremity and loss of motion in terms of forward elevation and external rotation. She reports constant pain morning and night reports 10 out of 10 pain at night. She had one MRI which did not show cuff tear.  Review of systems she reports recent weight loss fever chills difficulty sleeping sinusitis sore throat and active dental issues. She reports swelling of her arms and legs with shortness of breath heartburn joint pain visual disturbance headaches dizziness falls memory problems numbness tingling  Past Medical History  Diagnosis Date  . Hyperlipidemia   . Allergic rhinitis 11/08/2010  . Osteoporosis 11/21/2011  . Asthma   . Bronchitis   . Anxiety   . Mitral valve prolapse   . Herpes simplex of female genitalia    Past Surgical History  Procedure Laterality Date  . Leep  11/2004  . Closed manipulation shoulder  7&01/2005    x2, 30 days apart  . Esophagogastroduodenoscopy  09/2012    Normal (Dr. Ardis Hughs)  . Colonoscopy  09/2012    Medium sized external hemorrhoids, few small divertic in left colon, otherwise normal colon  (repeat 10 yrs)  . Closed manipulation shoulder  2006    Left   History  Substance Use Topics  . Smoking status: Never Smoker   . Smokeless tobacco: Never Used  . Alcohol Use: No    Vital signs BP 107/68  Ht 5\' 5"  (1.651 m)  Wt 118 lb (53.524 kg)  BMI 19.64 kg/m2   General appearance: Development, nutrition are normal. Body habitus small frame No gross deformities are noted and grooming normal.  Peripheral vascular system no swelling or varicose veins are noted and pulses are palpable without tenderness, temperature warm to touch no edema.  No palpable lymph nodes are noted in the cervical area or axillae.  The skin overlying the right and left shoulder cervical and thoracic spine is normal without rash, lesion or ulceration  Deep tendon reflexes are normal and equal. And pathologic reflexes such as Hoffman sign are negative.  Sensation remains normal.  The patient is oriented to person place and time, the mood and affect are normal  Ambulation remains normal  Cervical spine no mass or tenderness. Range of motion is normal. Muscle tone is normal. Skin is normal.  Right shoulder There is tenderness around the anterior deltoid and the posterior aspect of the acromion, internal rotation is limited, forward elevation is limited. External rotation is limited to 15 stability tests were limited by limitations of motion There is mild weakness of the supraspinatus tendon 4/5 with normal internal and external rotation strength  are 5/5. The impingement sign is positive  Left shoulder rotation of the arm externally and forward elevation is normal  We did a lidocaine injection test it did not improve her symptoms or motion  Recommend MRI of the shoulder continue home exercises if MRI reveals no cuff tear that she should see a therapist. No surgical intervention necessary  Procedure note after verbal consent and confirmation by timeout the right shoulder was injected in the  subacromial space.  Medication used lidocaine 10 cc with Depo-Medrol 40 mg. The skin was prepped with alcohol and anesthetized with ethyl chloride. Injection was performed without difficulty.  No followup appointment necessary results of her MRI and further instructions were given by telephone

## 2013-11-24 NOTE — Patient Instructions (Signed)
Adhesive Capsulitis Sometimes the shoulder becomes stiff and is painful to move. Some people say it feels as if the shoulder is frozen in place. Because of this, the condition is called "frozen shoulder." Its medical name is adhesive capsulitis.  The shoulder joint is made up of strong connective tissue that attaches the ball of the humerus to the shallow shoulder socket. This strong connective tissue is called the joint capsule. This tissue can become stiff and swollen. That is when adhesive capsulitis sets in. CAUSES  It is not always clear just what the cause adhesive capsulitis. Possibilities include:  Injury to the shoulder joint.  Strain. This is a repetitive injury brought about by overuse.  Lack of use. Perhaps your arm or hand was otherwise injured. It might have been in a sling for awhile. Or perhaps you were not using it to avoid pain.  Referred pain. This is a sort of trick the body plays. You feel pain in the shoulder. But, the pain actually comes from an injury somewhere else in the body.  Roderick Calo-standing health problems. Several diseases can cause adhesive capsulitis. They include diabetes, heart disease, stroke, thyroid problems, rheumatoid arthritis and lung disease.  Being a women older than 53. Anyone can develop adhesive capsulitis but it is most common in women in this age group. SYMPTOMS   Pain.  It occurs when the arm is moved.  Parts of the shoulder might hurt if they are touched.  Pain is worse at night or when resting.  Soreness. It might not be strong enough to be called pain. But, the shoulder aches.  The shoulder does not move freely.  Muscle spasms.  Trouble sleeping because of shoulder ache or pain. DIAGNOSIS  To decide if you have adhesive capsulitis, your healthcare provider will probably:  Ask about symptoms you have noticed.  Ask about your history of joint pain and anything that might have caused the pain.  Ask about your overall  health.  Use hands to feel your shoulder and neck.  Ask you to move your shoulder in specific directions. This may indicate the origin of the pain.  Order imaging tests; pictures of the shoulder. They help pinpoint the source of the problem. An X-ray might be used. For more detail, an MRI is often used. An MRI details the tendons, muscles and ligaments as well as the joint. TREATMENT  Adhesive capsulitis can be treated several ways. Most treatments can be done in a clinic or in your healthcare provider's office. Be sure to discuss the different options with your caregiver. They include:  Physical therapy. You will work on specific exercises to get your shoulder moving again. The exercises usually involve stretching. A physical therapist (a caregiver with special training) can show you what to do and what not to do. The exercises will need to be done daily.  Medication.  Over-the-counter medicines may relieve pain and inflammation (the body's way of reacting to injury or infection).  Corticosteroids. These are stronger drugs to reduce pain and inflammation. They are given by injection (shots) into the shoulder joint. Frequent treatment is not recommended.  Muscle relaxants. Medication may be prescribed to ease muscle spasms.  Treatment of underlying conditions. This means treating another condition that is causing your shoulder problem. This might be a rotator cuff (tendon) problem  Shoulder manipulation. The shoulder will be moved by your healthcare provider. You would be under general anesthesia (given a drug that puts you to sleep). You would not feel anything. Sometimes  the joint will be injected with salt water (saline) at high pressure to break down internal scarring in the joint capsule.  Surgery. This is rarely needed. It may be suggested in advanced cases after all other treatment has failed. PROGNOSIS  In time, most people recover from adhesive capsulitis. Sometimes, however, the  pain goes away but full movement of the shoulder does not return.  HOME CARE INSTRUCTIONS   Take any pain medications recommended by your healthcare provider. Follow the directions carefully.  If you have physical therapy, follow through with the therapist's suggestions. Be sure you understand the exercises you will be doing. You should understand:  How often the exercises should be done.  How many times each exercise should be repeated.  How Kellie [name] they should be done.  What other activities you should do, or not do.  That you should warm up before doing any exercise. Just 5 to 10 minutes will help. Small, gentle movements should get your shoulder ready for more.  Avoid high-demand exercise that involves your shoulder such as throwing. This type of exercise can make pain worse.  Consider using cold packs. Cold may ease swelling and pain. Ask your healthcare provider if a cold pack might help you. If so, get directions on how and when to use them. SEEK MEDICAL CARE IF:   You have any questions about your medications.  Your pain continues to increase. Document Released: 04/08/2009 Document Revised: 09/03/2011 Document Reviewed: 04/08/2009 [facility] Patient Information 2014 [place], [place].

## 2013-11-30 ENCOUNTER — Telehealth: Payer: Self-pay | Admitting: Orthopedic Surgery

## 2013-11-30 DIAGNOSIS — M7500 Adhesive capsulitis of unspecified shoulder: Secondary | ICD-10-CM

## 2013-11-30 NOTE — Telephone Encounter (Signed)
Call received from [facility], PH# [phone], per Bonnita Nasuti; states patient had contacted them to ask if her MRI, ordered by Dr Aline Brochure, to ask if she may have her MRI at their facility, since she has had other MRI's there.  Bonnita Nasuti states if so, she would need order.  Patient to remain with orders as indicated, for Forestine Na (due to [facility] discount, and Dr Harrison's orders)?  Or, if need to fax new order, their fax# is [phone].

## 2013-11-30 NOTE — Telephone Encounter (Signed)
I spoke to [place]born at the facility and she is going to call the patient and schedule the MRI.

## 2013-12-01 ENCOUNTER — Telehealth: Payer: Self-pay | Admitting: *Deleted

## 2013-12-01 ENCOUNTER — Other Ambulatory Visit: Payer: Self-pay | Admitting: Physician Assistant

## 2013-12-01 ENCOUNTER — Ambulatory Visit (INDEPENDENT_AMBULATORY_CARE_PROVIDER_SITE_OTHER): Payer: Self-pay

## 2013-12-01 DIAGNOSIS — M810 Age-related osteoporosis without current pathological fracture: Secondary | ICD-10-CM

## 2013-12-01 DIAGNOSIS — Z1231 Encounter for screening mammogram for malignant neoplasm of breast: Secondary | ICD-10-CM

## 2013-12-01 NOTE — Telephone Encounter (Signed)
Order has been placed. I cannot promise it will be able to be done tomorrow morning.  Can we see if front desk can get this scheduled?

## 2013-12-01 NOTE — Telephone Encounter (Signed)
Pt called wanting order for DEXA to be done at [facility] tomorrow morning. I have pended order. Please advise.

## 2013-12-02 ENCOUNTER — Ambulatory Visit: Payer: Self-pay | Admitting: Physician Assistant

## 2013-12-02 ENCOUNTER — Ambulatory Visit (INDEPENDENT_AMBULATORY_CARE_PROVIDER_SITE_OTHER): Payer: Self-pay

## 2013-12-02 ENCOUNTER — Telehealth: Payer: Self-pay | Admitting: Physician Assistant

## 2013-12-02 DIAGNOSIS — M6580 Other synovitis and tenosynovitis, unspecified site: Secondary | ICD-10-CM

## 2013-12-02 DIAGNOSIS — X58XXXA Exposure to other specified factors, initial encounter: Secondary | ICD-10-CM

## 2013-12-02 DIAGNOSIS — M25519 Pain in unspecified shoulder: Secondary | ICD-10-CM

## 2013-12-02 DIAGNOSIS — S46819A Strain of other muscles, fascia and tendons at shoulder and upper arm level, unspecified arm, initial encounter: Secondary | ICD-10-CM

## 2013-12-02 DIAGNOSIS — M810 Age-related osteoporosis without current pathological fracture: Secondary | ICD-10-CM

## 2013-12-02 DIAGNOSIS — Z0289 Encounter for other administrative examinations: Secondary | ICD-10-CM

## 2013-12-02 DIAGNOSIS — M75100 Unspecified rotator cuff tear or rupture of unspecified shoulder, not specified as traumatic: Secondary | ICD-10-CM

## 2013-12-02 DIAGNOSIS — IMO0002 Reserved for concepts with insufficient information to code with codable children: Secondary | ICD-10-CM

## 2013-12-02 NOTE — Telephone Encounter (Signed)
PATIENT HAD AN APPOINTMENT FOR WEAKNESS AND DID NOT COME

## 2013-12-02 NOTE — Telephone Encounter (Signed)
Patient was scheduled for MRI and DEXA scan this morning at [facility].  I am assuming that is why she did not show up for her appointment.  Please call patient to tell her to reschedule appointment if she is still having concerns.

## 2013-12-02 NOTE — Telephone Encounter (Signed)
Per Imaging at [facility], pt was supposed to return this morning for test. They just needed [place] to place an order. Thanks.

## 2013-12-03 ENCOUNTER — Telehealth: Payer: Self-pay | Admitting: *Deleted

## 2013-12-03 MED ORDER — ALENDRONATE SODIUM 70 MG PO TABS: 70.0000 mg | ORAL_TABLET | ORAL | Status: DC

## 2013-12-03 NOTE — Telephone Encounter (Signed)
Patient informed, understood & agreed; Rx sent to pharmacy/[facility]

## 2013-12-03 NOTE — Telephone Encounter (Signed)
Message copied by Rockwell Germany on Thu Dec 03, 2013  2:07 PM ------      Message from: Raiford Noble      Created: Thu Dec 03, 2013 11:56 AM       DEXA shows continued Osteoporosis.  I see patient is on Citracal but this is not sufficient for osteoporosis treatment. She was given a Rx for Fosamax by Melissa last year, but it seems the prescription was never filled.  I need you to see if she has some objection to bisphosphonate therapy. If not, we need to give her once weekly dosing of fosamax and repeat DEXA in 2 years.  Once we start therapy, I want to see her in 1 month to assess how she is tolerating medication.  Also she had a mammogram that was negative for concerning findings.  [facility] have [facility], but I just want to reiterate the good results to her. ------

## 2013-12-07 ENCOUNTER — Encounter: Payer: Self-pay | Admitting: Orthopedic Surgery

## 2013-12-07 ENCOUNTER — Ambulatory Visit: Payer: Self-pay | Admitting: Orthopedic Surgery

## 2013-12-07 ENCOUNTER — Ambulatory Visit: Payer: Self-pay | Admitting: Family Medicine

## 2013-12-08 ENCOUNTER — Ambulatory Visit (INDEPENDENT_AMBULATORY_CARE_PROVIDER_SITE_OTHER): Payer: Self-pay | Admitting: Sports Medicine

## 2013-12-08 ENCOUNTER — Encounter: Payer: Self-pay | Admitting: Sports Medicine

## 2013-12-08 VITALS — BP 119/75 | HR 90 | Ht 65.0 in | Wt 118.0 lb

## 2013-12-08 DIAGNOSIS — Z299 Encounter for prophylactic measures, unspecified: Secondary | ICD-10-CM

## 2013-12-08 DIAGNOSIS — Z01419 Encounter for gynecological examination (general) (routine) without abnormal findings: Secondary | ICD-10-CM | POA: Insufficient documentation

## 2013-12-08 DIAGNOSIS — M7501 Adhesive capsulitis of right shoulder: Secondary | ICD-10-CM

## 2013-12-08 DIAGNOSIS — M7500 Adhesive capsulitis of unspecified shoulder: Secondary | ICD-10-CM

## 2013-12-08 NOTE — Assessment & Plan Note (Addendum)
Wilmer has now seen another sport Dr. and an orthopedic surgeon, both performed on guided injections hoping to get to the glenohumeral space however she never reported instantaneous relief. This is her last injection, glenohumeral under guidance, with formal physical therapy. Return to see me in a month. Considering the duration of her symptoms, and severity of capsular adhesion I do have concern that this will proceed to operative manipulation under anesthesia. Fingers crossed for aggressive physical therapy.

## 2013-12-08 NOTE — Progress Notes (Signed)
  Subjective:    CC: Establish care and right shoulder pain   HPI:  Right shoulder pain: This is a very pleasant 58 year-old female who comes in with a 7 month history of right-sided shoulder pain with significant decreased range of motion. She went to sports medicine in [facility], an unguided injection was performed that did not provide any relief, not even temporarily, she was then seen at [place], another injection, likely subacromial, did not provide any relief. She is post left shoulder adhesive capsulitis with manipulation under anesthesia and is doing well. Pain is moderate, persistent, localized to the joint line. She did have an MRI ordered by another provider, she likely to review this today.  Past medical history, Surgical history, Family history not pertinant except as noted below, Social history, Allergies, and medications have been entered into the medical record, reviewed, and no changes needed.   Review of Systems: No headache, visual changes, nausea, vomiting, diarrhea, constipation, dizziness, abdominal pain, skin rash, fevers, chills, night sweats, swollen lymph nodes, weight loss, chest pain, body aches, joint swelling, muscle aches, shortness of breath, mood changes, visual or auditory hallucinations.  Objective:    General: Well Developed, well nourished, and in no acute distress.  Neuro: Alert and oriented x3, extra-ocular muscles intact, sensation grossly intact.  HEENT: Normocephalic, atraumatic, pupils equal round reactive to light, neck supple, no masses, no lymphadenopathy, thyroid nonpalpable.  Skin: Warm and dry, no rashes noted.  Cardiac: Regular rate and rhythm, no murmurs rubs or gallops.  Respiratory: Clear to auscultation bilaterally. Not using accessory muscles, speaking in full sentences.  Abdominal: Soft, nontender, nondistended, positive bowel sounds, no masses, no organomegaly.  Right shoulder: Extremely limited range of motion, nearly all  abduction and external rotation involves movement of the scapula, on bracing of the scapula she seems completely frozen.  MRI was reviewed and shows partial articular sided tears of the supraspinatus and subscapularis, tendinosis of the intra-articular biceps, glenohumeral osteoarthritis with axillary recess synovitis suggestive of adhesive capsulitis.  Procedure: Real-time Ultrasound Guided Injection of right glenohumeral joint Device: GE Logiq E  Verbal informed consent obtained.  Time-out conducted.  Noted no overlying erythema, induration, or other signs of local infection.  Skin prepped in a sterile fashion.  Local anesthesia: Topical Ethyl chloride.  With sterile technique and under real time ultrasound guidance:  Spinal needle advanced into the joint taking care to avoid the posterior labrum, 1 cc Kenalog 40, 4 cc lidocaine injected easily. Completed without difficulty  Pain immediately resolved suggesting accurate placement of the medication. I again attempted manipulation of the shoulder after injection, it was pain-free however she continued to be frozen. Advised to call if fevers/chills, erythema, induration, drainage, or persistent bleeding.  Images permanently stored and available for review in the ultrasound unit.  Impression: Technically successful ultrasound guided injection.  Impression and Recommendations:    The patient was counselled, risk factors were discussed, anticipatory guidance given.

## 2013-12-08 NOTE — Assessment & Plan Note (Signed)
Checking routine bloodwork. 

## 2013-12-12 LAB — COMPREHENSIVE METABOLIC PANEL WITH GFR
Alkaline Phosphatase: 44 U/L (ref 39–117)
Glucose, Bld: 89 mg/dL (ref 70–99)
Potassium: 4.5 meq/L (ref 3.5–5.3)
Total Protein: 6.8 g/dL (ref 6.0–8.3)

## 2013-12-12 LAB — TSH: TSH: 1.961 u[IU]/mL (ref 0.350–4.500)

## 2013-12-12 LAB — COMPREHENSIVE METABOLIC PANEL
ALT: 14 U/L (ref 0–35)
AST: 17 U/L (ref 0–37)
Albumin: 4.4 g/dL (ref 3.5–5.2)
BUN: 13 mg/dL (ref 6–23)
CO2: 26 mEq/L (ref 19–32)
Calcium: 9.6 mg/dL (ref 8.4–10.5)
Chloride: 103 mEq/L (ref 96–112)
Creat: 0.58 mg/dL (ref 0.50–1.10)
Sodium: 137 mEq/L (ref 135–145)
Total Bilirubin: 0.4 mg/dL (ref 0.2–1.2)

## 2013-12-12 LAB — FOLATE: Folate: >20 ng/mL

## 2013-12-12 LAB — CBC
HCT: 36.7 % (ref 36.0–46.0)
Hemoglobin: 12.4 g/dL (ref 12.0–15.0)
MCH: 30.6 pg (ref 26.0–34.0)
MCHC: 33.8 g/dL (ref 30.0–36.0)
MCV: 90.6 fL (ref 78.0–100.0)
Platelets: 403 K/uL — ABNORMAL HIGH (ref 150–400)
RBC: 4.05 MIL/uL (ref 3.87–5.11)
RDW: 13.8 % (ref 11.5–15.5)
WBC: 7.5 10*3/uL (ref 4.0–10.5)

## 2013-12-12 LAB — LIPID PANEL
Cholesterol: 201 mg/dL — ABNORMAL HIGH (ref 0–200)
HDL: 83 mg/dL (ref >39)
LDL Cholesterol: 106 mg/dL — ABNORMAL HIGH (ref 0–99)
Total CHOL/HDL Ratio: 2.4 Ratio
Triglycerides: 59 mg/dL (ref <150)
VLDL: 12 mg/dL (ref 0–40)

## 2013-12-12 LAB — VITAMIN B12: Vitamin B-12: 560 pg/mL (ref 211–911)

## 2013-12-12 LAB — HEMOGLOBIN A1C
Hgb A1c MFr Bld: 5.9 % — ABNORMAL HIGH (ref <5.7)
Mean Plasma Glucose: 123 mg/dL — ABNORMAL HIGH (ref <117)

## 2013-12-14 ENCOUNTER — Encounter: Payer: Self-pay | Admitting: Sports Medicine

## 2013-12-14 ENCOUNTER — Ambulatory Visit (INDEPENDENT_AMBULATORY_CARE_PROVIDER_SITE_OTHER): Payer: Self-pay

## 2013-12-14 ENCOUNTER — Ambulatory Visit (INDEPENDENT_AMBULATORY_CARE_PROVIDER_SITE_OTHER): Payer: Self-pay | Admitting: Physician Assistant

## 2013-12-14 ENCOUNTER — Encounter: Payer: Self-pay | Admitting: Physician Assistant

## 2013-12-14 VITALS — BP 101/74 | HR 85 | Temp 98.5°F | Ht 65.0 in | Wt 115.0 lb

## 2013-12-14 DIAGNOSIS — R0602 Shortness of breath: Secondary | ICD-10-CM

## 2013-12-14 DIAGNOSIS — R05 Cough: Secondary | ICD-10-CM

## 2013-12-14 DIAGNOSIS — R062 Wheezing: Secondary | ICD-10-CM

## 2013-12-14 DIAGNOSIS — M4130 Thoracogenic scoliosis, site unspecified: Secondary | ICD-10-CM

## 2013-12-14 DIAGNOSIS — R7303 Prediabetes: Secondary | ICD-10-CM | POA: Insufficient documentation

## 2013-12-14 DIAGNOSIS — J0100 Acute maxillary sinusitis, unspecified: Secondary | ICD-10-CM

## 2013-12-14 DIAGNOSIS — R059 Cough, unspecified: Secondary | ICD-10-CM

## 2013-12-14 MED ORDER — FLUCONAZOLE 150 MG PO TABS: 150.0000 mg | ORAL_TABLET | Freq: Once | ORAL | Status: DC

## 2013-12-14 MED ORDER — AZITHROMYCIN 250 MG PO TABS: ORAL_TABLET | ORAL | Status: DC

## 2013-12-14 MED ORDER — ALBUTEROL SULFATE (2.5 MG/3ML) 0.083% IN NEBU
2.5000 mg | INHALATION_SOLUTION | Freq: Once | RESPIRATORY_TRACT | Status: AC
  Administered 2013-12-14: 2.5 mg via RESPIRATORY_TRACT

## 2013-12-14 MED ORDER — ALBUTEROL SULFATE HFA 108 (90 BASE) MCG/ACT IN AERS: 2.0000 | INHALATION_SPRAY | Freq: Four times a day (QID) | RESPIRATORY_TRACT | Status: DC | PRN

## 2013-12-14 NOTE — Patient Instructions (Addendum)
mucinex can help clear sputum.  CXR.  Zpak/ibprofen for Headache Diflucan for thrush Albuterol as needed for SOB  Sinusitis Sinusitis is redness, soreness, and swelling (inflammation) of the paranasal sinuses. Paranasal sinuses are air pockets within the bones of your face (beneath the eyes, the middle of the forehead, or above the eyes). In healthy paranasal sinuses, mucus is able to drain out, and air is able to circulate through them by way of your nose. However, when your paranasal sinuses are inflamed, mucus and air can become trapped. This can allow bacteria and other germs to grow and cause infection. Sinusitis can develop quickly and last only a short time (acute) or continue over a long period (chronic). Sinusitis that lasts for more than 12 weeks is considered chronic.  CAUSES  Causes of sinusitis include:  Allergies.  Structural abnormalities, such as displacement of the cartilage that separates your nostrils (deviated septum), which can decrease the air flow through your nose and sinuses and affect sinus drainage.  Functional abnormalities, such as when the small hairs (cilia) that line your sinuses and help remove mucus do not work properly or are not present. SYMPTOMS  Symptoms of acute and chronic sinusitis are the same. The primary symptoms are pain and pressure around the affected sinuses. Other symptoms include:  Upper toothache.  Earache.  Headache.  Bad breath.  Decreased sense of smell and taste.  A cough, which worsens when you are lying flat.  Fatigue.  Fever.  Thick drainage from your nose, which often is green and may contain pus (purulent).  Swelling and warmth over the affected sinuses. DIAGNOSIS  Your caregiver will perform a physical exam. During the exam, your caregiver may:  Look in your nose for signs of abnormal growths in your nostrils (nasal polyps).  Tap over the affected sinus to check for signs of infection.  View the inside of your  sinuses (endoscopy) with a special imaging device with a light attached (endoscope), which is inserted into your sinuses. If your caregiver suspects that you have chronic sinusitis, one or more of the following tests may be recommended:  Allergy tests.  Nasal culture--A sample of mucus is taken from your nose and sent to a lab and screened for bacteria.  Nasal cytology--A sample of mucus is taken from your nose and examined by your caregiver to determine if your sinusitis is related to an allergy. TREATMENT  Most cases of acute sinusitis are related to a viral infection and will resolve on their own within 10 days. Sometimes medicines are prescribed to help relieve symptoms (pain medicine, decongestants, nasal steroid sprays, or saline sprays).  However, for sinusitis related to a bacterial infection, your caregiver will prescribe antibiotic medicines. These are medicines that will help kill the bacteria causing the infection.  Rarely, sinusitis is caused by a fungal infection. In theses cases, your caregiver will prescribe antifungal medicine. For some cases of chronic sinusitis, surgery is needed. Generally, these are cases in which sinusitis recurs more than 3 times per year, despite other treatments. HOME CARE INSTRUCTIONS   Drink plenty of water. Water helps thin the mucus so your sinuses can drain more easily.  Use a humidifier.  Inhale steam 3 to 4 times a day (for example, sit in the bathroom with the shower running).  Apply a warm, moist washcloth to your face 3 to 4 times a day, or as directed by your caregiver.  Use saline nasal sprays to help moisten and clean your sinuses.  Take  over-the-counter or prescription medicines for pain, discomfort, or fever only as directed by your caregiver. SEEK IMMEDIATE MEDICAL CARE IF:  You have increasing pain or severe headaches.  You have nausea, vomiting, or drowsiness.  You have swelling around your face.  You have vision  problems.  You have a stiff neck.  You have difficulty breathing. MAKE SURE YOU:   Understand these instructions.  Will watch your condition.  Will get help right away if you are not doing well or get worse. Document Released: 06/11/2005 Document Revised: 09/03/2011 Document Reviewed: 06/26/2011 [facility] Patient Information 2015 [place], [place]. This information is not intended to replace advice given to you by your health care Vin Yonke. Make sure you discuss any questions you have with your health care Joycelyn Liska.

## 2013-12-14 NOTE — Telephone Encounter (Signed)
Pt would like PT set up in [place] not [place].

## 2013-12-14 NOTE — Progress Notes (Signed)
   Subjective:    Patient ID: Kellie [name], female    DOB: [birth year], 58 y.o.   MRN: [id number]  HPI Pt is a 58 yo female who presents to the clinic with ST, cough, drainage, and ongoing headache. She also feels short of breath and like her chest is tight. No wheezing.  Going on for the last 10 days. Not getting any better. Took 10 days of keflex of her sisters. No improvement. Her headache is constant today. Worse with coughing. Coughing is productive. No history of lung dx. No fever, chills, n/v/d. She does have some sinus pressure. She is also very dizzy. Denies any ear pain.       Review of Systems  All other systems reviewed and are negative.      Objective:   Physical Exam  Constitutional: She is oriented to person, place, and time. She appears well-developed and well-nourished.  HENT:  Head: Normocephalic and atraumatic.  Right Ear: External ear normal.  Left Ear: External ear normal.  TM's clear with some fluid present behind both ears causing a slight bulge.   Some mild frontal sinus tenderness to palpation.  Nasal turbinates red and swollen.   Oropharynx erythematous without tonsilar swelling or exudate.   White discharge on tongue.  Eyes: Conjunctivae are normal. Right eye exhibits no discharge. Left eye exhibits no discharge.  Neck: Normal range of motion. Neck supple.  Cardiovascular: Normal rate, regular rhythm and normal heart sounds.   Pulmonary/Chest: Effort normal and breath sounds normal. She has no wheezes.  Lymphadenopathy:    She has no cervical adenopathy.  Neurological: She is alert and oriented to person, place, and time.  Skin: Skin is dry.  Psychiatric: She has a normal mood and affect. Her behavior is normal.          Assessment & Plan:  URI/acute bronchitis/sinusitis- peak flow in yellow. Albuterol nebulizer given in office today. She did get really dizzy at the end of treatment but reported that her chest felt better. Laying down her BP  had dropped from 120 to 101 on top. Pt was given water and she felt better. Will treat with zpak for 5 days. Albuterol inhaler was given to use as needed for next couple of days 2-6hours. Will get CXR today. CBC and BMP ordered. Follow up if not improving or worsening. If headache continues will consider imaging of head.    Thrush- diflucan one tablet given.

## 2013-12-15 LAB — CBC WITH DIFFERENTIAL/PLATELET
Basophils Absolute: 0.1 10*3/uL (ref 0.0–0.1)
Basophils Relative: 1 % (ref 0–1)
Eosinophils Absolute: 0.1 10*3/uL (ref 0.0–0.7)
Eosinophils Relative: 1 % (ref 0–5)
HEMATOCRIT: 39.9 % (ref 36.0–46.0)
HEMOGLOBIN: 13.7 g/dL (ref 12.0–15.0)
LYMPHS PCT: 20 % (ref 12–46)
Lymphs Abs: 2.2 10*3/uL (ref 0.7–4.0)
MCH: 31.3 pg (ref 26.0–34.0)
MCHC: 34.3 g/dL (ref 30.0–36.0)
MCV: 91.1 fL (ref 78.0–100.0)
MONO ABS: 0.6 10*3/uL (ref 0.1–1.0)
MONOS PCT: 5 % (ref 3–12)
NEUTROS ABS: 8 10*3/uL — AB (ref 1.7–7.7)
Neutrophils Relative %: 73 % (ref 43–77)
Platelets: 429 10*3/uL — ABNORMAL HIGH (ref 150–400)
RBC: 4.38 MIL/uL (ref 3.87–5.11)
RDW: 13.6 % (ref 11.5–15.5)
WBC: 11 10*3/uL — AB (ref 4.0–10.5)

## 2013-12-15 LAB — BASIC METABOLIC PANEL
BUN: 19 mg/dL (ref 6–23)
CHLORIDE: 99 meq/L (ref 96–112)
CO2: 30 mEq/L (ref 19–32)
Calcium: 9.8 mg/dL (ref 8.4–10.5)
Creat: 0.9 mg/dL (ref 0.50–1.10)
GLUCOSE: 86 mg/dL (ref 70–99)
POTASSIUM: 4.2 meq/L (ref 3.5–5.3)
Sodium: 138 mEq/L (ref 135–145)

## 2013-12-15 LAB — VITAMIN D 25 HYDROXY (VIT D DEFICIENCY, FRACTURES): VIT D 25 HYDROXY: 58 ng/mL (ref 30–89)

## 2013-12-15 NOTE — Telephone Encounter (Signed)
Physical therapy switched to [facility]

## 2013-12-23 ENCOUNTER — Ambulatory Visit (INDEPENDENT_AMBULATORY_CARE_PROVIDER_SITE_OTHER): Payer: Self-pay | Admitting: Physical Therapy

## 2013-12-23 DIAGNOSIS — M25619 Stiffness of unspecified shoulder, not elsewhere classified: Secondary | ICD-10-CM

## 2013-12-23 DIAGNOSIS — M6281 Muscle weakness (generalized): Secondary | ICD-10-CM

## 2013-12-23 DIAGNOSIS — M25519 Pain in unspecified shoulder: Secondary | ICD-10-CM

## 2013-12-23 DIAGNOSIS — M7500 Adhesive capsulitis of unspecified shoulder: Secondary | ICD-10-CM

## 2013-12-30 ENCOUNTER — Encounter (INDEPENDENT_AMBULATORY_CARE_PROVIDER_SITE_OTHER): Payer: Self-pay | Admitting: Physical Therapy

## 2013-12-30 DIAGNOSIS — M25519 Pain in unspecified shoulder: Secondary | ICD-10-CM

## 2013-12-30 DIAGNOSIS — M6281 Muscle weakness (generalized): Secondary | ICD-10-CM

## 2013-12-30 DIAGNOSIS — M7500 Adhesive capsulitis of unspecified shoulder: Secondary | ICD-10-CM

## 2013-12-30 DIAGNOSIS — M25619 Stiffness of unspecified shoulder, not elsewhere classified: Secondary | ICD-10-CM

## 2014-01-01 ENCOUNTER — Encounter (INDEPENDENT_AMBULATORY_CARE_PROVIDER_SITE_OTHER): Payer: Self-pay | Admitting: Physical Therapy

## 2014-01-01 DIAGNOSIS — M6281 Muscle weakness (generalized): Secondary | ICD-10-CM

## 2014-01-01 DIAGNOSIS — M7500 Adhesive capsulitis of unspecified shoulder: Secondary | ICD-10-CM

## 2014-01-01 DIAGNOSIS — M25619 Stiffness of unspecified shoulder, not elsewhere classified: Secondary | ICD-10-CM

## 2014-01-01 DIAGNOSIS — M25519 Pain in unspecified shoulder: Secondary | ICD-10-CM

## 2014-01-04 ENCOUNTER — Encounter (INDEPENDENT_AMBULATORY_CARE_PROVIDER_SITE_OTHER): Payer: Self-pay | Admitting: Physical Therapy

## 2014-01-04 ENCOUNTER — Ambulatory Visit: Payer: Self-pay | Admitting: Sports Medicine

## 2014-01-04 DIAGNOSIS — M25676 Stiffness of unspecified foot, not elsewhere classified: Secondary | ICD-10-CM

## 2014-01-04 DIAGNOSIS — M25519 Pain in unspecified shoulder: Secondary | ICD-10-CM

## 2014-01-04 DIAGNOSIS — M6281 Muscle weakness (generalized): Secondary | ICD-10-CM

## 2014-01-04 DIAGNOSIS — M25673 Stiffness of unspecified ankle, not elsewhere classified: Secondary | ICD-10-CM

## 2014-01-04 DIAGNOSIS — M7500 Adhesive capsulitis of unspecified shoulder: Secondary | ICD-10-CM

## 2014-01-06 ENCOUNTER — Encounter: Payer: Self-pay | Admitting: Physical Therapy

## 2014-01-08 ENCOUNTER — Encounter (INDEPENDENT_AMBULATORY_CARE_PROVIDER_SITE_OTHER): Payer: Self-pay | Admitting: Physical Therapy

## 2014-01-08 DIAGNOSIS — M25519 Pain in unspecified shoulder: Secondary | ICD-10-CM

## 2014-01-08 DIAGNOSIS — M6281 Muscle weakness (generalized): Secondary | ICD-10-CM

## 2014-01-08 DIAGNOSIS — M25619 Stiffness of unspecified shoulder, not elsewhere classified: Secondary | ICD-10-CM

## 2014-01-08 DIAGNOSIS — M7500 Adhesive capsulitis of unspecified shoulder: Secondary | ICD-10-CM

## 2014-01-12 ENCOUNTER — Encounter (INDEPENDENT_AMBULATORY_CARE_PROVIDER_SITE_OTHER): Payer: Self-pay | Admitting: Physical Therapy

## 2014-01-12 DIAGNOSIS — M25619 Stiffness of unspecified shoulder, not elsewhere classified: Secondary | ICD-10-CM

## 2014-01-12 DIAGNOSIS — M7580 Other shoulder lesions, unspecified shoulder: Secondary | ICD-10-CM

## 2014-01-12 DIAGNOSIS — M25519 Pain in unspecified shoulder: Secondary | ICD-10-CM

## 2014-01-12 DIAGNOSIS — M25819 Other specified joint disorders, unspecified shoulder: Secondary | ICD-10-CM

## 2014-01-12 DIAGNOSIS — M6281 Muscle weakness (generalized): Secondary | ICD-10-CM

## 2014-01-13 ENCOUNTER — Encounter (INDEPENDENT_AMBULATORY_CARE_PROVIDER_SITE_OTHER): Payer: Self-pay

## 2014-01-13 ENCOUNTER — Encounter: Payer: Self-pay | Admitting: Physical Therapy

## 2014-01-13 DIAGNOSIS — M6281 Muscle weakness (generalized): Secondary | ICD-10-CM

## 2014-01-13 DIAGNOSIS — M25519 Pain in unspecified shoulder: Secondary | ICD-10-CM

## 2014-01-13 DIAGNOSIS — M25619 Stiffness of unspecified shoulder, not elsewhere classified: Secondary | ICD-10-CM

## 2014-01-13 DIAGNOSIS — M7500 Adhesive capsulitis of unspecified shoulder: Secondary | ICD-10-CM

## 2014-01-15 ENCOUNTER — Encounter: Payer: Self-pay | Admitting: Physical Therapy

## 2014-01-18 ENCOUNTER — Encounter: Payer: Self-pay | Admitting: Physical Therapy

## 2014-01-20 ENCOUNTER — Encounter (INDEPENDENT_AMBULATORY_CARE_PROVIDER_SITE_OTHER): Payer: Self-pay

## 2014-01-20 DIAGNOSIS — M25519 Pain in unspecified shoulder: Secondary | ICD-10-CM

## 2014-01-20 DIAGNOSIS — M6281 Muscle weakness (generalized): Secondary | ICD-10-CM

## 2014-01-20 DIAGNOSIS — M7500 Adhesive capsulitis of unspecified shoulder: Secondary | ICD-10-CM

## 2014-01-20 DIAGNOSIS — M25619 Stiffness of unspecified shoulder, not elsewhere classified: Secondary | ICD-10-CM

## 2014-01-22 ENCOUNTER — Encounter (INDEPENDENT_AMBULATORY_CARE_PROVIDER_SITE_OTHER): Payer: Self-pay | Admitting: Physical Therapy

## 2014-01-22 DIAGNOSIS — M6281 Muscle weakness (generalized): Secondary | ICD-10-CM

## 2014-01-22 DIAGNOSIS — M25519 Pain in unspecified shoulder: Secondary | ICD-10-CM

## 2014-01-22 DIAGNOSIS — M25619 Stiffness of unspecified shoulder, not elsewhere classified: Secondary | ICD-10-CM

## 2014-01-22 DIAGNOSIS — M7500 Adhesive capsulitis of unspecified shoulder: Secondary | ICD-10-CM

## 2014-01-25 ENCOUNTER — Encounter (INDEPENDENT_AMBULATORY_CARE_PROVIDER_SITE_OTHER): Payer: Self-pay | Admitting: Physical Therapy

## 2014-01-25 DIAGNOSIS — M6281 Muscle weakness (generalized): Secondary | ICD-10-CM

## 2014-01-25 DIAGNOSIS — M7500 Adhesive capsulitis of unspecified shoulder: Secondary | ICD-10-CM

## 2014-01-25 DIAGNOSIS — M25619 Stiffness of unspecified shoulder, not elsewhere classified: Secondary | ICD-10-CM

## 2014-01-25 DIAGNOSIS — M25519 Pain in unspecified shoulder: Secondary | ICD-10-CM

## 2014-01-27 ENCOUNTER — Encounter (INDEPENDENT_AMBULATORY_CARE_PROVIDER_SITE_OTHER): Payer: Self-pay | Admitting: Physical Therapy

## 2014-01-27 DIAGNOSIS — M6281 Muscle weakness (generalized): Secondary | ICD-10-CM

## 2014-01-27 DIAGNOSIS — M25519 Pain in unspecified shoulder: Secondary | ICD-10-CM

## 2014-01-27 DIAGNOSIS — M7500 Adhesive capsulitis of unspecified shoulder: Secondary | ICD-10-CM

## 2014-01-27 DIAGNOSIS — M25619 Stiffness of unspecified shoulder, not elsewhere classified: Secondary | ICD-10-CM

## 2014-01-29 ENCOUNTER — Encounter (INDEPENDENT_AMBULATORY_CARE_PROVIDER_SITE_OTHER): Payer: Self-pay | Admitting: Physical Therapy

## 2014-01-29 DIAGNOSIS — M7500 Adhesive capsulitis of unspecified shoulder: Secondary | ICD-10-CM

## 2014-01-29 DIAGNOSIS — M25519 Pain in unspecified shoulder: Secondary | ICD-10-CM

## 2014-01-29 DIAGNOSIS — M25619 Stiffness of unspecified shoulder, not elsewhere classified: Secondary | ICD-10-CM

## 2014-01-29 DIAGNOSIS — M6281 Muscle weakness (generalized): Secondary | ICD-10-CM

## 2014-02-01 ENCOUNTER — Encounter (INDEPENDENT_AMBULATORY_CARE_PROVIDER_SITE_OTHER): Payer: Self-pay | Admitting: Physical Therapy

## 2014-02-01 DIAGNOSIS — M7500 Adhesive capsulitis of unspecified shoulder: Secondary | ICD-10-CM

## 2014-02-01 DIAGNOSIS — M6281 Muscle weakness (generalized): Secondary | ICD-10-CM

## 2014-02-01 DIAGNOSIS — M25519 Pain in unspecified shoulder: Secondary | ICD-10-CM

## 2014-02-01 DIAGNOSIS — M25619 Stiffness of unspecified shoulder, not elsewhere classified: Secondary | ICD-10-CM

## 2014-02-03 ENCOUNTER — Encounter (INDEPENDENT_AMBULATORY_CARE_PROVIDER_SITE_OTHER): Payer: Self-pay | Admitting: Physical Therapy

## 2014-02-03 DIAGNOSIS — M7500 Adhesive capsulitis of unspecified shoulder: Secondary | ICD-10-CM

## 2014-02-03 DIAGNOSIS — M6281 Muscle weakness (generalized): Secondary | ICD-10-CM

## 2014-02-03 DIAGNOSIS — M25519 Pain in unspecified shoulder: Secondary | ICD-10-CM

## 2014-02-03 DIAGNOSIS — M25619 Stiffness of unspecified shoulder, not elsewhere classified: Secondary | ICD-10-CM

## 2014-02-04 ENCOUNTER — Encounter: Payer: Self-pay | Admitting: Physical Therapy

## 2014-02-05 ENCOUNTER — Encounter: Payer: Self-pay | Admitting: Physical Therapy

## 2014-02-08 ENCOUNTER — Encounter (INDEPENDENT_AMBULATORY_CARE_PROVIDER_SITE_OTHER): Payer: Self-pay

## 2014-02-08 DIAGNOSIS — M25619 Stiffness of unspecified shoulder, not elsewhere classified: Secondary | ICD-10-CM

## 2014-02-08 DIAGNOSIS — M6281 Muscle weakness (generalized): Secondary | ICD-10-CM

## 2014-02-08 DIAGNOSIS — M25519 Pain in unspecified shoulder: Secondary | ICD-10-CM

## 2014-02-08 DIAGNOSIS — M7500 Adhesive capsulitis of unspecified shoulder: Secondary | ICD-10-CM

## 2014-02-10 ENCOUNTER — Encounter (INDEPENDENT_AMBULATORY_CARE_PROVIDER_SITE_OTHER): Payer: Self-pay

## 2014-02-10 DIAGNOSIS — M25619 Stiffness of unspecified shoulder, not elsewhere classified: Secondary | ICD-10-CM

## 2014-02-10 DIAGNOSIS — M25519 Pain in unspecified shoulder: Secondary | ICD-10-CM

## 2014-02-10 DIAGNOSIS — M6281 Muscle weakness (generalized): Secondary | ICD-10-CM

## 2014-02-10 DIAGNOSIS — M7500 Adhesive capsulitis of unspecified shoulder: Secondary | ICD-10-CM

## 2014-02-15 ENCOUNTER — Encounter: Payer: Self-pay | Admitting: Physical Therapy

## 2014-02-15 ENCOUNTER — Encounter (INDEPENDENT_AMBULATORY_CARE_PROVIDER_SITE_OTHER): Payer: Self-pay | Admitting: Physical Therapy

## 2014-02-15 DIAGNOSIS — M25619 Stiffness of unspecified shoulder, not elsewhere classified: Secondary | ICD-10-CM

## 2014-02-15 DIAGNOSIS — M25519 Pain in unspecified shoulder: Secondary | ICD-10-CM

## 2014-02-15 DIAGNOSIS — M6281 Muscle weakness (generalized): Secondary | ICD-10-CM

## 2014-02-15 DIAGNOSIS — M7500 Adhesive capsulitis of unspecified shoulder: Secondary | ICD-10-CM

## 2014-02-17 ENCOUNTER — Encounter: Payer: Self-pay | Admitting: Physician Assistant

## 2014-02-17 ENCOUNTER — Encounter (INDEPENDENT_AMBULATORY_CARE_PROVIDER_SITE_OTHER): Payer: Self-pay

## 2014-02-17 ENCOUNTER — Encounter: Payer: Self-pay | Admitting: Physical Therapy

## 2014-02-17 ENCOUNTER — Ambulatory Visit (INDEPENDENT_AMBULATORY_CARE_PROVIDER_SITE_OTHER): Payer: Self-pay | Admitting: Physician Assistant

## 2014-02-17 VITALS — BP 128/77 | HR 76 | Temp 97.9°F | Ht 65.0 in | Wt 116.0 lb

## 2014-02-17 DIAGNOSIS — M25619 Stiffness of unspecified shoulder, not elsewhere classified: Secondary | ICD-10-CM

## 2014-02-17 DIAGNOSIS — M25519 Pain in unspecified shoulder: Secondary | ICD-10-CM

## 2014-02-17 DIAGNOSIS — J0110 Acute frontal sinusitis, unspecified: Secondary | ICD-10-CM

## 2014-02-17 DIAGNOSIS — J302 Other seasonal allergic rhinitis: Secondary | ICD-10-CM

## 2014-02-17 DIAGNOSIS — R062 Wheezing: Secondary | ICD-10-CM

## 2014-02-17 DIAGNOSIS — J309 Allergic rhinitis, unspecified: Secondary | ICD-10-CM

## 2014-02-17 DIAGNOSIS — M7500 Adhesive capsulitis of unspecified shoulder: Secondary | ICD-10-CM

## 2014-02-17 DIAGNOSIS — R0602 Shortness of breath: Secondary | ICD-10-CM

## 2014-02-17 DIAGNOSIS — M6281 Muscle weakness (generalized): Secondary | ICD-10-CM

## 2014-02-17 MED ORDER — FLUCONAZOLE 150 MG PO TABS: 150.0000 mg | ORAL_TABLET | Freq: Once | ORAL | Status: DC

## 2014-02-17 MED ORDER — LEVOFLOXACIN 500 MG PO TABS: 500.0000 mg | ORAL_TABLET | Freq: Every day | ORAL | Status: DC

## 2014-02-17 MED ORDER — IPRATROPIUM-ALBUTEROL 0.5-2.5 (3) MG/3ML IN SOLN
3.0000 mL | Freq: Once | RESPIRATORY_TRACT | Status: AC
  Administered 2014-02-17: 3 mL via RESPIRATORY_TRACT

## 2014-02-17 MED ORDER — PREDNISONE 50 MG PO TABS: ORAL_TABLET | ORAL | Status: DC

## 2014-02-17 NOTE — Progress Notes (Signed)
   Subjective:    Patient ID: Kellie [name], female    DOB: 1955/09/28, 58 y.o.   MRN: [id number]  HPI Pt presents to the clinic with recurrent symptoms of cough, sinus pressure, shortness or breath, wheezing. She really just wants to get to bottom of this. She has been to pulmonolgist and did not seem to help as well. She take chlor-trimeton and does seem to help a little bit. Last seen here 12/14/13. Starts with congestion and sneezing and just seems to progress. Cough is somewhat productive. No fever or chills. No insurance and could not afford albuterol inhaler.   Review of Systems  All other systems reviewed and are negative.      Objective:   Physical Exam  Constitutional: She is oriented to person, place, and time. She appears well-developed and well-nourished.  HENT:  Head: Normocephalic and atraumatic.  Eyes: Conjunctivae are normal.  Cardiovascular: Normal rate, regular rhythm and normal heart sounds.   Pulmonary/Chest: Effort normal and breath sounds normal. She has no wheezes.  Neurological: She is alert and oriented to person, place, and time.  Skin: Skin is dry.  Psychiatric: She has a normal mood and affect. Her behavior is normal.          Assessment & Plan:  Recurrent frontal sinusitis/cough/seasonal allergies/SOB/wheezing- unclear etiology. duoneb given in office.  Certainly acid reflux could be causing cough and irritation. Start back on omeprazole in the morning. Symptoms seem consistent with sinusitis will treat with levaquin for 10 days. Diflucan given if gets yeast infection. Seems like allergies could also be trigger. qnasal sample given. Free sample of albuterol given with coupon card to use 4-6 hours for SOB and wheezing. Prednisone for 5 days. Start zyrtec daily. Will get spirometry. ordered.

## 2014-02-17 NOTE — Patient Instructions (Signed)
Start back on omeprazole in the morning.  Start levquin Diflucan given for yeast.  qnasl 2 sprays each nostril daily.  albuterol for next 3-5 days use it every 4-6 hours.  Prednisone for 5 days.   Zyrtec daily.   Will get spirometry ordered.

## 2014-02-22 ENCOUNTER — Encounter: Payer: Self-pay | Admitting: Physical Therapy

## 2014-02-22 ENCOUNTER — Encounter (INDEPENDENT_AMBULATORY_CARE_PROVIDER_SITE_OTHER): Payer: Self-pay | Admitting: Physical Therapy

## 2014-02-22 DIAGNOSIS — M25519 Pain in unspecified shoulder: Secondary | ICD-10-CM

## 2014-02-22 DIAGNOSIS — M25619 Stiffness of unspecified shoulder, not elsewhere classified: Secondary | ICD-10-CM

## 2014-02-22 DIAGNOSIS — M6281 Muscle weakness (generalized): Secondary | ICD-10-CM

## 2014-02-22 DIAGNOSIS — M7500 Adhesive capsulitis of unspecified shoulder: Secondary | ICD-10-CM

## 2014-02-23 ENCOUNTER — Ambulatory Visit (INDEPENDENT_AMBULATORY_CARE_PROVIDER_SITE_OTHER): Payer: Self-pay | Admitting: Sports Medicine

## 2014-02-23 ENCOUNTER — Encounter: Payer: Self-pay | Admitting: Sports Medicine

## 2014-02-23 VITALS — BP 98/65 | HR 93 | Ht 66.0 in | Wt 117.0 lb

## 2014-02-23 DIAGNOSIS — F329 Major depressive disorder, single episode, unspecified: Secondary | ICD-10-CM

## 2014-02-23 DIAGNOSIS — M7500 Adhesive capsulitis of unspecified shoulder: Secondary | ICD-10-CM

## 2014-02-23 DIAGNOSIS — M7501 Adhesive capsulitis of right shoulder: Secondary | ICD-10-CM

## 2014-02-23 DIAGNOSIS — F341 Dysthymic disorder: Secondary | ICD-10-CM

## 2014-02-23 DIAGNOSIS — F32A Depression, unspecified: Secondary | ICD-10-CM

## 2014-02-23 DIAGNOSIS — Z0000 Encounter for general adult medical examination without abnormal findings: Secondary | ICD-10-CM

## 2014-02-23 DIAGNOSIS — F419 Anxiety disorder, unspecified: Secondary | ICD-10-CM

## 2014-02-23 NOTE — Assessment & Plan Note (Signed)
Pain-free, motion is continuing to improve with therapy. No changes in plan.

## 2014-02-23 NOTE — Assessment & Plan Note (Signed)
It seems as though she is having some forgetfulness. She has seen several psychologists and it sounds as though she is being worked up for ADHD. I would like to bring all of her paperwork to me so we can go over this and better determine what the next step will be.

## 2014-02-23 NOTE — Assessment & Plan Note (Signed)
Referral to OB/GYN for cervical cancer screening 

## 2014-02-23 NOTE — Progress Notes (Signed)
  Subjective:    CC: Followup  HPI: Right adhesive capsulitis: Pain is completely resolved, range of motion continues to improve significantly 3 months after glenohumeral injection. She tells me she would never want to consider surgery or manipulation under anesthesia.  Preventative measures: Desires referral to OB/GYN or cervical cancer screening, she did also want results for her mammogram.  Poor memory: Tells me she has been seen by a couple of psychologists, wonders if I can get her in earlier with one of her psychologists. I really don't know what we are discussing so she will take her previous records, bring them to me, and we will discuss what's going on.  Past medical history, Surgical history, Family history not pertinant except as noted below, Social history, Allergies, and medications have been entered into the medical record, reviewed, and no changes needed.   Review of Systems: No fevers, chills, night sweats, weight loss, chest pain, or shortness of breath.   Objective:    General: Well Developed, well nourished, and in no acute distress.  Neuro: Alert and oriented x3, extra-ocular muscles intact, sensation grossly intact.  HEENT: Normocephalic, atraumatic, pupils equal round reactive to light, neck supple, no masses, no lymphadenopathy, thyroid nonpalpable.  Skin: Warm and dry, no rashes. Cardiac: Regular rate and rhythm, no murmurs rubs or gallops, no lower extremity edema.  Respiratory: Clear to auscultation bilaterally. Not using accessory muscles, speaking in full sentences. Right shoulder: External rotation is still limited to approximately 15, otherwise excellent motion.  Impression and Recommendations:

## 2014-02-25 ENCOUNTER — Encounter: Payer: Self-pay | Admitting: Physical Therapy

## 2014-03-02 ENCOUNTER — Encounter (INDEPENDENT_AMBULATORY_CARE_PROVIDER_SITE_OTHER): Payer: Self-pay | Admitting: Physical Therapy

## 2014-03-02 DIAGNOSIS — M25619 Stiffness of unspecified shoulder, not elsewhere classified: Secondary | ICD-10-CM

## 2014-03-02 DIAGNOSIS — M25519 Pain in unspecified shoulder: Secondary | ICD-10-CM

## 2014-03-02 DIAGNOSIS — M629 Disorder of muscle, unspecified: Secondary | ICD-10-CM

## 2014-03-02 DIAGNOSIS — M7500 Adhesive capsulitis of unspecified shoulder: Secondary | ICD-10-CM

## 2014-03-09 ENCOUNTER — Encounter: Payer: Self-pay | Admitting: Physical Therapy

## 2014-03-16 ENCOUNTER — Encounter: Payer: Self-pay | Admitting: Physical Therapy

## 2014-03-17 ENCOUNTER — Ambulatory Visit (INDEPENDENT_AMBULATORY_CARE_PROVIDER_SITE_OTHER): Payer: Self-pay | Admitting: Obstetrics & Gynecology

## 2014-03-17 ENCOUNTER — Encounter: Payer: Self-pay | Admitting: Obstetrics & Gynecology

## 2014-03-17 VITALS — BP 88/66 | HR 66 | Resp 16 | Ht 66.0 in | Wt 116.0 lb

## 2014-03-17 DIAGNOSIS — Z1151 Encounter for screening for human papillomavirus (HPV): Secondary | ICD-10-CM

## 2014-03-17 DIAGNOSIS — Z124 Encounter for screening for malignant neoplasm of cervix: Secondary | ICD-10-CM

## 2014-03-17 DIAGNOSIS — N952 Postmenopausal atrophic vaginitis: Secondary | ICD-10-CM | POA: Insufficient documentation

## 2014-03-17 DIAGNOSIS — Z01419 Encounter for gynecological examination (general) (routine) without abnormal findings: Secondary | ICD-10-CM

## 2014-03-17 MED ORDER — ESTRADIOL 0.1 MG/GM VA CREA: TOPICAL_CREAM | VAGINAL | Status: DC
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
## 2014-03-18 ENCOUNTER — Encounter: Payer: Self-pay | Admitting: Physical Therapy

## 2014-03-22 LAB — CYTOLOGY - PAP

## 2014-04-23 ENCOUNTER — Ambulatory Visit (INDEPENDENT_AMBULATORY_CARE_PROVIDER_SITE_OTHER): Payer: Self-pay | Admitting: Sports Medicine

## 2014-04-23 ENCOUNTER — Encounter: Payer: Self-pay | Admitting: Sports Medicine

## 2014-04-23 ENCOUNTER — Telehealth: Payer: Self-pay | Admitting: Sports Medicine

## 2014-04-23 VITALS — BP 99/63 | HR 80 | Ht 66.0 in | Wt 119.0 lb

## 2014-04-23 DIAGNOSIS — M7501 Adhesive capsulitis of right shoulder: Secondary | ICD-10-CM

## 2014-04-23 DIAGNOSIS — F418 Other specified anxiety disorders: Secondary | ICD-10-CM

## 2014-04-23 DIAGNOSIS — F419 Anxiety disorder, unspecified: Secondary | ICD-10-CM

## 2014-04-23 DIAGNOSIS — R300 Dysuria: Secondary | ICD-10-CM

## 2014-04-23 DIAGNOSIS — F329 Major depressive disorder, single episode, unspecified: Secondary | ICD-10-CM

## 2014-04-23 DIAGNOSIS — F32A Depression, unspecified: Secondary | ICD-10-CM

## 2014-04-23 NOTE — Telephone Encounter (Signed)
Lab slip faxed to lab.

## 2014-04-23 NOTE — Progress Notes (Signed)
  Subjective:    CC: Follow-up  HPI: Right adhesive capsulitis: Analyah has had several injections, the most recent of which was approximately 3 months ago, with ultrasound guidance into the glenohumeral joint. She does extremely well with no pain after injections. Unfortunately her range of motion only improved minimally. She is overall happy with where she is at right now, and has been resistant to considering manipulation under anesthesia. Symptoms have been present for years now.  Insomnia: For the past several weeks has had increasing difficulty sleeping, flight of ideas, pressured speech, uncontrolled spending. She also has fluctuating depressive symptoms as well. She does have a clinical diagnosis of depression but has not yet been worked up or treated for bipolar disorder.  Past medical history, Surgical history, Family history not pertinant except as noted below, Social history, Allergies, and medications have been entered into the medical record, reviewed, and no changes needed.   Review of Systems: No fevers, chills, night sweats, weight loss, chest pain, or shortness of breath.   Objective:    General: Well Developed, well nourished, and in no acute distress.  Neuro: Alert and oriented x3, extra-ocular muscles intact, sensation grossly intact.  HEENT: Normocephalic, atraumatic, pupils equal round reactive to light, neck supple, no masses, no lymphadenopathy, thyroid nonpalpable.  Skin: Warm and dry, no rashes. Cardiac: Regular rate and rhythm, no murmurs rubs or gallops, no lower extremity edema.  Respiratory: Clear to auscultation bilaterally. Not using accessory muscles, speaking in full sentences. Right shoulder: 15 of external rotation, 20 of abduction. Stored flexion to approximately 35.  Impression and Recommendations:

## 2014-04-23 NOTE — Addendum Note (Signed)
Addended by: [name] on: 04/23/2014 02:52 PM   Modules accepted: Orders

## 2014-04-23 NOTE — Telephone Encounter (Signed)
Patient seen Dr. Darene Lamer today and had labs done and request to have urinalysis added to her lab order. Eddie Dibbles has already gotten a urine sample but he needs something sent downstairs-.. Thanks

## 2014-04-23 NOTE — Assessment & Plan Note (Signed)
Slight recurrence of pain after glenohumeral injection. She continues to be somewhat resistant to manipulation under anesthesia. If pain worsens we can do a repeat glenohumeral injection up to 4 times per year. If range of motion continues to be poor, I would be happy to refer her to Dr. Tamera Punt for consideration of manipulation under anesthesia.

## 2014-04-23 NOTE — Patient Instructions (Signed)

## 2014-04-23 NOTE — Assessment & Plan Note (Addendum)
Patient does have a history in a diagnosis of depression. She is starting to have some insomnia which is her principal complaint today. Combined with racing thoughts, pressured speech, periods of uncontrolled spending, this likely represents bipolar disorder. I would like her to touch base with psychiatry downstairs for assistance in evaluation.

## 2014-04-24 LAB — CBC
HCT: 38.9 % (ref 36.0–46.0)
Hemoglobin: 12.9 g/dL (ref 12.0–15.0)
MCH: 30.3 pg (ref 26.0–34.0)
MCHC: 33.2 g/dL (ref 30.0–36.0)
MCV: 91.3 fL (ref 78.0–100.0)
Platelets: 345 10*3/uL (ref 150–400)
RBC: 4.26 MIL/uL (ref 3.87–5.11)
RDW: 12.9 % (ref 11.5–15.5)
WBC: 5.9 10*3/uL (ref 4.0–10.5)

## 2014-04-24 LAB — COMPREHENSIVE METABOLIC PANEL
AST: 17 U/L (ref 0–37)
Alkaline Phosphatase: 51 U/L (ref 39–117)
BUN: 16 mg/dL (ref 6–23)
CO2: 26 mEq/L (ref 19–32)
Calcium: 9.7 mg/dL (ref 8.4–10.5)
Chloride: 102 mEq/L (ref 96–112)
Creat: 0.79 mg/dL (ref 0.50–1.10)
Glucose, Bld: 85 mg/dL (ref 70–99)
Total Bilirubin: 0.4 mg/dL (ref 0.2–1.2)

## 2014-04-24 LAB — TSH: TSH: 2.555 u[IU]/mL (ref 0.350–4.500)

## 2014-04-24 LAB — COMPREHENSIVE METABOLIC PANEL WITH GFR
ALT: 11 U/L (ref 0–35)
Albumin: 4.6 g/dL (ref 3.5–5.2)
Potassium: 4.2 meq/L (ref 3.5–5.3)
Sodium: 141 meq/L (ref 135–145)
Total Protein: 6.8 g/dL (ref 6.0–8.3)

## 2014-04-24 LAB — HEMOGLOBIN A1C
Hgb A1c MFr Bld: 5.9 % — ABNORMAL HIGH (ref <5.7)
Mean Plasma Glucose: 123 mg/dL — ABNORMAL HIGH (ref <117)

## 2014-04-26 ENCOUNTER — Telehealth: Payer: Self-pay | Admitting: *Deleted

## 2014-04-26 ENCOUNTER — Encounter: Payer: Self-pay | Admitting: Sports Medicine

## 2014-04-26 DIAGNOSIS — F411 Generalized anxiety disorder: Secondary | ICD-10-CM

## 2014-04-26 DIAGNOSIS — F32A Depression, unspecified: Secondary | ICD-10-CM

## 2014-04-26 DIAGNOSIS — F329 Major depressive disorder, single episode, unspecified: Secondary | ICD-10-CM

## 2014-04-26 NOTE — Telephone Encounter (Signed)
Pt states she has not heard anything about the behavorial health referral. Ref placed for anxiety and depression

## 2014-04-29 ENCOUNTER — Encounter (HOSPITAL_COMMUNITY): Payer: Self-pay | Admitting: Psychiatry

## 2014-04-29 ENCOUNTER — Ambulatory Visit (INDEPENDENT_AMBULATORY_CARE_PROVIDER_SITE_OTHER): Payer: Self-pay | Admitting: Psychiatry

## 2014-04-29 VITALS — BP 104/76 | HR 72 | Ht 66.0 in | Wt 116.0 lb

## 2014-04-29 DIAGNOSIS — F419 Anxiety disorder, unspecified: Secondary | ICD-10-CM

## 2014-04-29 DIAGNOSIS — F319 Bipolar disorder, unspecified: Secondary | ICD-10-CM | POA: Insufficient documentation

## 2014-04-29 DIAGNOSIS — F310 Bipolar disorder, current episode hypomanic: Secondary | ICD-10-CM

## 2014-04-29 LAB — URINALYSIS
Bilirubin Urine: NEGATIVE
Glucose, UA: NEGATIVE mg/dL
Hgb urine dipstick: NEGATIVE
Ketones, ur: NEGATIVE mg/dL
Leukocytes, UA: NEGATIVE
Nitrite: NEGATIVE
Protein, ur: NEGATIVE mg/dL
Specific Gravity, Urine: 1.019 (ref 1.005–1.030)
Urobilinogen, UA: 0.2 mg/dL (ref 0.0–1.0)
pH: 7 (ref 5.0–8.0)

## 2014-04-29 MED ORDER — LAMOTRIGINE 25 MG PO TABS: ORAL_TABLET | ORAL | Status: DC
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
## 2014-05-27 ENCOUNTER — Ambulatory Visit (INDEPENDENT_AMBULATORY_CARE_PROVIDER_SITE_OTHER): Payer: Self-pay | Admitting: Licensed Clinical Social Worker

## 2014-05-27 ENCOUNTER — Encounter (INDEPENDENT_AMBULATORY_CARE_PROVIDER_SITE_OTHER): Payer: Self-pay

## 2014-05-27 ENCOUNTER — Encounter (HOSPITAL_COMMUNITY): Payer: Self-pay | Admitting: Psychiatry

## 2014-05-27 ENCOUNTER — Ambulatory Visit (INDEPENDENT_AMBULATORY_CARE_PROVIDER_SITE_OTHER): Payer: Self-pay | Admitting: Psychiatry

## 2014-05-27 VITALS — BP 114/61 | HR 77 | Ht 66.0 in | Wt 117.0 lb

## 2014-05-27 DIAGNOSIS — G4700 Insomnia, unspecified: Secondary | ICD-10-CM

## 2014-05-27 DIAGNOSIS — F3181 Bipolar II disorder: Secondary | ICD-10-CM

## 2014-05-27 DIAGNOSIS — F310 Bipolar disorder, current episode hypomanic: Secondary | ICD-10-CM
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
## 2014-06-02 ENCOUNTER — Encounter: Payer: Self-pay | Admitting: Physician Assistant

## 2014-06-02 ENCOUNTER — Ambulatory Visit (INDEPENDENT_AMBULATORY_CARE_PROVIDER_SITE_OTHER): Payer: Self-pay | Admitting: Physician Assistant

## 2014-06-02 VITALS — BP 101/66 | HR 81 | Temp 98.0°F | Wt 115.0 lb

## 2014-06-02 DIAGNOSIS — J0110 Acute frontal sinusitis, unspecified: Secondary | ICD-10-CM

## 2014-06-02 DIAGNOSIS — F319 Bipolar disorder, unspecified: Secondary | ICD-10-CM | POA: Insufficient documentation

## 2014-06-02 DIAGNOSIS — F3130 Bipolar disorder, current episode depressed, mild or moderate severity, unspecified: Secondary | ICD-10-CM

## 2014-06-02 MED ORDER — DOXYCYCLINE HYCLATE 100 MG PO TABS: 100.0000 mg | ORAL_TABLET | Freq: Two times a day (BID) | ORAL | Status: DC

## 2014-06-02 MED ORDER — ALPRAZOLAM 0.5 MG PO TABS: 0.5000 mg | ORAL_TABLET | Freq: Every evening | ORAL | Status: DC | PRN

## 2014-06-02 NOTE — Progress Notes (Signed)
   Subjective:    Patient ID: Kellie [name], female    DOB: Aug 20, 1955, 58 y.o.   MRN: [id number]  HPI  .[name] KitchenSINUSITIS  Onset: 2 weeks Severity: worsening Worse with: head movement and time Better with: nasal spray  Symptoms Cough: yes, dry no production Runny nose: no Fever: no    Sinus Pressure: yes  Ears Blocked: yes  Teeth Ache: no  Frontal Headache: yes  Second sickening: no   PMH Sinusitis or Recurrent OM: yes, Kellie [name] does get a few sinus infections a year.   PMH Prior Sinus or Ear Surgery: no  Recent antibiotic usage (last 30 days): no  PMH of Diabetes or Immunocompromise: no    Red flags Change in mental state: no Change in vision: no Rash: no  Pt request xanax for sleep. Kellie [name] states Kellie [name] has been downstairs and not interested in taking bipolar medication. Kellie [name] does not like the side effects. Kellie [name] is having a lot of problems sleeping. Her moods have been up and down but Kellie [name] is more down now.        Review of Systems  All other systems reviewed and are negative.      Objective:   Physical Exam  Constitutional: Kellie [name] is oriented to person, place, and time. Kellie [name] appears well-developed and well-nourished.  HENT:  Head: Normocephalic and atraumatic.  Mouth/Throat: Oropharynx is clear and moist.  TM's erythematous and air bubbles behind TM.   Lots of frontal and maxillary tenderness to palpation.   Nasal turbinates red and swollen.    Eyes: Conjunctivae are normal. Right eye exhibits no discharge. Left eye exhibits no discharge.  Neck: Normal range of motion. Neck supple. No thyromegaly present.  Cardiovascular: Normal rate, regular rhythm and normal heart sounds.   Pulmonary/Chest: Effort normal and breath sounds normal. Kellie [name] has no wheezes.  Neurological: Kellie [name] is alert and oriented to person, place, and time.  Skin: Skin is dry.  Psychiatric: Kellie [name] has a normal mood and affect. Her behavior is normal.          Assessment & Plan:  Frontal sinusitis- treated with  doxycycline per pt request. Discuss to continue with flonase and mucinex. Follow up as needed.   Insomnia/bipolar- small quanity of xanax to use only at bedtime for sleep was given as needed. Discussed with patient Kellie [name] needs to try bipolar medications they could help with her sleep along with keeping her mood more stable. Discussed side effects of lamictal. Encouraged pt to start lamictal psych gave downstairs and follow up with him. Also discussed PCP needs to prescribe controlled substances so Kellie [name] needs to have a conversation with Dr. Darene Lamer about xanax at bedtime.

## 2014-06-28 DIAGNOSIS — R413 Other amnesia: Secondary | ICD-10-CM

## 2014-06-29 ENCOUNTER — Ambulatory Visit (INDEPENDENT_AMBULATORY_CARE_PROVIDER_SITE_OTHER): Payer: Self-pay | Admitting: Psychiatry

## 2014-06-29 ENCOUNTER — Encounter (HOSPITAL_COMMUNITY): Payer: Self-pay | Admitting: Psychiatry

## 2014-06-29 VITALS — BP 96/82 | HR 85 | Ht 66.0 in | Wt 115.0 lb

## 2014-06-29 DIAGNOSIS — F3181 Bipolar II disorder: Secondary | ICD-10-CM

## 2014-06-29 DIAGNOSIS — G4700 Insomnia, unspecified: Secondary | ICD-10-CM

## 2014-06-29 DIAGNOSIS — F419 Anxiety disorder, unspecified: Secondary | ICD-10-CM

## 2014-06-29 MED ORDER — LAMOTRIGINE 100 MG PO TABS: 100.0000 mg | ORAL_TABLET | Freq: Every day | ORAL | Status: DC
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
## 2014-07-06 DIAGNOSIS — R413 Other amnesia: Secondary | ICD-10-CM

## 2014-07-19 ENCOUNTER — Other Ambulatory Visit: Payer: Self-pay | Admitting: Physician Assistant

## 2014-07-21 NOTE — Telephone Encounter (Signed)
See office note with Jade.

## 2014-08-03 ENCOUNTER — Ambulatory Visit (INDEPENDENT_AMBULATORY_CARE_PROVIDER_SITE_OTHER): Payer: Self-pay | Admitting: Sports Medicine

## 2014-08-03 ENCOUNTER — Encounter: Payer: Self-pay | Admitting: Sports Medicine

## 2014-08-03 VITALS — BP 102/64 | HR 88 | Temp 98.1°F | Ht 67.0 in | Wt 114.0 lb

## 2014-08-03 DIAGNOSIS — H6501 Acute serous otitis media, right ear: Secondary | ICD-10-CM

## 2014-08-03 DIAGNOSIS — H6591 Unspecified nonsuppurative otitis media, right ear: Secondary | ICD-10-CM | POA: Insufficient documentation

## 2014-08-03 DIAGNOSIS — G4700 Insomnia, unspecified: Secondary | ICD-10-CM

## 2014-08-03 DIAGNOSIS — R4183 Borderline intellectual functioning: Secondary | ICD-10-CM

## 2014-08-03 MED ORDER — HYDROXYZINE HCL 50 MG PO TABS: 50.0000 mg | ORAL_TABLET | Freq: Every day | ORAL | Status: DC

## 2014-08-03 MED ORDER — FLUTICASONE PROPIONATE 50 MCG/ACT NA SUSP: NASAL | Status: DC

## 2014-08-03 MED ORDER — LORATADINE 10 MG PO TABS: 10.0000 mg | ORAL_TABLET | Freq: Every day | ORAL | Status: DC

## 2014-08-03 NOTE — Patient Instructions (Signed)
Barotitis Media Barotitis media is inflammation of your middle ear. This occurs when the auditory tube (eustachian tube) leading from the back of your nose (nasopharynx) to your eardrum is blocked. This blockage may result from a cold, environmental allergies, or an upper respiratory infection. Unresolved barotitis media may lead to damage or hearing loss (barotrauma), which may become permanent. HOME CARE INSTRUCTIONS   Use medicines as recommended by your health care provider. Over-the-counter medicines will help unblock the canal and can help during times of air travel.  Do not put anything into your ears to clean or unplug them. Eardrops will not be helpful.  Do not swim, dive, or fly until your health care provider says it is all right to do so. If these activities are necessary, chewing gum with frequent, forceful swallowing may help. It is also helpful to hold your nose and gently blow to pop your ears for equalizing pressure changes. This forces air into the eustachian tube.  Only take over-the-counter or prescription medicines for pain, discomfort, or fever as directed by your health care provider.  A decongestant may be helpful in decongesting the middle ear and make pressure equalization easier. SEEK MEDICAL CARE IF:  You experience a serious form of dizziness in which you feel as if the room is spinning and you feel nauseated (vertigo).  Your symptoms only involve one ear. SEEK IMMEDIATE MEDICAL CARE IF:   You develop a severe headache, dizziness, or severe ear pain.  You have bloody or pus-like drainage from your ears.  You develop a fever.  Your problems do not improve or become worse. MAKE SURE YOU:   Understand these instructions.  Will watch your condition.  Will get help right away if you are not doing well or get worse. Document Released: 06/08/2000 Document Revised: 04/01/2013 Document Reviewed: 01/06/2013 Exit[employer] Patient Information 2015 [employer]. This  information is not intended to replace advice given to you by your health care provider. Make sure you discuss any questions you have with your health care provider.  

## 2014-08-03 NOTE — Assessment & Plan Note (Signed)
Discontinue all controlled substances. Adding hydroxyzine at bedtime.
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
## 2014-08-03 NOTE — Assessment & Plan Note (Signed)
Recently finished treatment for acute maxillary sinusitis. Persistent right serous otitis media, we are going to treat her with nasal steroids and antihistamines. Return in a month for this.

## 2014-08-03 NOTE — Progress Notes (Signed)
  Subjective:    CC: follow-up  HPI: This is a pleasant 59 year old female, she comes in with complaints of a sinus infection which she was treated for recently. On further questioning her symptoms predominantly represent runny nose, postnasal drip symptoms as well as stuffiness in her right ear. Symptoms are moderate, persistent, she does not use any antihistamines or nasal sprays.  Past medical history, Surgical history, Family history not pertinant except as noted below, Social history, Allergies, and medications have been entered into the medical record, reviewed, and no changes needed.   Review of Systems: No fevers, chills, night sweats, weight loss, chest pain, or shortness of breath.   Objective:    General: Well Developed, well nourished, and in no acute distress.  Neuro: Alert and oriented x3, extra-ocular muscles intact, sensation grossly intact.  HEENT: Normocephalic, atraumatic, pupils equal round reactive to light, neck supple, no masses, no lymphadenopathy, thyroid nonpalpable. Oropharynx shows some mild erythema, nasopharynx shows mildly boggy turbinates, external ear canals show a serous otitis media behind the right tympanic membrane. No tenderness over the maxillary or frontal sinuses Skin: Warm and dry, no rashes. Cardiac: Regular rate and rhythm, no murmurs rubs or gallops, no lower extremity edema.  Respiratory: Clear to auscultation bilaterally. Not using accessory muscles, speaking in full sentences.  Impression and Recommendations:

## 2014-08-09 ENCOUNTER — Other Ambulatory Visit: Payer: Self-pay

## 2014-08-11 ENCOUNTER — Encounter: Payer: Self-pay | Admitting: Sports Medicine

## 2014-08-16 ENCOUNTER — Ambulatory Visit (INDEPENDENT_AMBULATORY_CARE_PROVIDER_SITE_OTHER): Payer: Self-pay

## 2014-08-16 DIAGNOSIS — Z87828 Personal history of other (healed) physical injury and trauma: Secondary | ICD-10-CM

## 2014-08-16 DIAGNOSIS — R42 Dizziness and giddiness: Secondary | ICD-10-CM

## 2014-08-16 DIAGNOSIS — H539 Unspecified visual disturbance: Secondary | ICD-10-CM

## 2014-08-16 DIAGNOSIS — R413 Other amnesia: Secondary | ICD-10-CM

## 2014-08-16 MED ORDER — GADOBENATE DIMEGLUMINE 529 MG/ML IV SOLN
10.0000 mL | Freq: Once | INTRAVENOUS | Status: AC | PRN
Start: 2014-08-16 — End: 2014-08-16
  Administered 2014-08-16: 10 mL via INTRAVENOUS

## 2014-08-24 ENCOUNTER — Ambulatory Visit (INDEPENDENT_AMBULATORY_CARE_PROVIDER_SITE_OTHER): Payer: Self-pay | Admitting: Psychiatry

## 2014-08-24 ENCOUNTER — Encounter (INDEPENDENT_AMBULATORY_CARE_PROVIDER_SITE_OTHER): Payer: Self-pay

## 2014-08-24 ENCOUNTER — Encounter (HOSPITAL_COMMUNITY): Payer: Self-pay | Admitting: Psychiatry

## 2014-08-24 VITALS — BP 112/90 | HR 93 | Ht 67.0 in | Wt 113.0 lb

## 2014-08-24 DIAGNOSIS — F419 Anxiety disorder, unspecified: Secondary | ICD-10-CM

## 2014-08-24 DIAGNOSIS — F3181 Bipolar II disorder: Secondary | ICD-10-CM

## 2014-08-24 DIAGNOSIS — G4700 Insomnia, unspecified: Secondary | ICD-10-CM

## 2014-08-24 MED ORDER — LAMOTRIGINE 100 MG PO TABS: 100.0000 mg | ORAL_TABLET | Freq: Two times a day (BID) | ORAL | Status: DC

## 2014-08-24 MED ORDER — HYDROXYZINE HCL 25 MG PO TABS: 25.0000 mg | ORAL_TABLET | Freq: Every day | ORAL | Status: DC | PRN
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
## 2014-10-11 ENCOUNTER — Ambulatory Visit (INDEPENDENT_AMBULATORY_CARE_PROVIDER_SITE_OTHER): Payer: Self-pay | Admitting: Physician Assistant

## 2014-10-11 ENCOUNTER — Encounter: Payer: Self-pay | Admitting: Physician Assistant

## 2014-10-11 ENCOUNTER — Ambulatory Visit (INDEPENDENT_AMBULATORY_CARE_PROVIDER_SITE_OTHER): Payer: Self-pay

## 2014-10-11 VITALS — BP 103/64 | HR 78 | Temp 97.9°F | Ht 66.0 in | Wt 113.0 lb

## 2014-10-11 DIAGNOSIS — M545 Low back pain, unspecified: Secondary | ICD-10-CM

## 2014-10-11 DIAGNOSIS — J309 Allergic rhinitis, unspecified: Secondary | ICD-10-CM

## 2014-10-11 DIAGNOSIS — M5136 Other intervertebral disc degeneration, lumbar region: Secondary | ICD-10-CM | POA: Insufficient documentation

## 2014-10-11 DIAGNOSIS — J0101 Acute recurrent maxillary sinusitis: Secondary | ICD-10-CM

## 2014-10-11 DIAGNOSIS — Z8051 Family history of malignant neoplasm of kidney: Secondary | ICD-10-CM

## 2014-10-11 LAB — POCT URINALYSIS DIPSTICK
BILIRUBIN UA: NEGATIVE
Blood, UA: NEGATIVE
Glucose, UA: NEGATIVE
KETONES UA: NEGATIVE
Leukocytes, UA: NEGATIVE
Nitrite, UA: NEGATIVE
Spec Grav, UA: 1.02
Urobilinogen, UA: NEGATIVE
pH, UA: 5.5

## 2014-10-11 MED ORDER — BECLOMETHASONE DIPROPIONATE 80 MCG/ACT NA AERS: 2.0000 | INHALATION_SPRAY | Freq: Every day | NASAL | Status: DC

## 2014-10-11 MED ORDER — METHYLPREDNISOLONE SODIUM SUCC 125 MG IJ SOLR
125.0000 mg | Freq: Once | INTRAMUSCULAR | Status: AC
  Administered 2014-10-11: 125 mg via INTRAMUSCULAR

## 2014-10-11 MED ORDER — AMOXICILLIN 500 MG PO CAPS: 500.0000 mg | ORAL_CAPSULE | Freq: Two times a day (BID) | ORAL | Status: DC

## 2014-10-11 NOTE — Patient Instructions (Addendum)
Stop claritin. Try zyrtec. Stop flonase start qnasal.  Consider allergy testing.  Finish amoxicillin.  Shot of solumedrol.   Ice area. Use ibuprofen PRN.

## 2014-10-11 NOTE — Progress Notes (Signed)
Subjective:    Patient ID: Kellie [name], female    DOB: 1955-11-10, 59 y.o.   MRN: [id number]  HPI  Pt presents to the clinic with sinus pressure, nasal congestion, headache for weeks. She has been treating as allergies with flonase and claritin. She does have hx of allergies. She finally started an old prescription of amoxicillin and symptoms started to improve. She continues to have symptoms and wants to have the completed regimen of amoxillicillin. She continues to have daily throat drainage and sinus pressure, and ear pressure. No ear pain, cough, sOb, or wheezing.    Pt has also started having some right sided only back pain for a few weeks. No radiation into legs, numbness, tingling, saddle numbness. She has had some increased urination. No known inury or work out problem. Describes pain as achy. Concerned because her mother had renal cancer. Not done anything to make better. Certain movements seem to make worse.       Review of Systems  All other systems reviewed and are negative.      Objective:   Physical Exam  Constitutional: She is oriented to person, place, and time. She appears well-developed and well-nourished.  HENT:  Head: Normocephalic and atraumatic.  Right Ear: External ear normal.  Left Ear: External ear normal.  Tm's clear bilaterally.   Oropharynx erythematous with no tonsilar swelling or exudate.   Bilateral nasal turbinates red and swollen.   Tenderness around maxillar sinuses to palpation.   Eyes:  Bilateral eyes watery and injected.  Neck: Normal range of motion. Neck supple.  Cardiovascular: Normal rate, regular rhythm and normal heart sounds.   Pulmonary/Chest: Effort normal and breath sounds normal. She has no wheezes.  Musculoskeletal:  Full ROM at waist with little discomfort.  No CVA tenderness.  Right sided tightness and slight discomfort over paraspinous muscle and along SI joint.  Negative straight leg test bilaterally.  No pain to  palpation over right hip bursa.  No pain to palpation over lumbar spine.   Lymphadenopathy:    She has no cervical adenopathy.  Neurological: She is alert and oriented to person, place, and time.  Skin: Skin is dry.  Psychiatric: She has a normal mood and affect. Her behavior is normal.          Assessment & Plan:  Acute maxillary sinusitis/allergic rhinitis- treated with amoxicillin for 10 days. I do think there is an allergy component to recurrence of sinus infection. Try qnasl to replace flonase. Solumedrol 125mg  given in office today. Stop claritin and switch it up to zyrtec. I think allergy testing might not be a bad idea. Call if would like for me to make referral.   Right low back pain- no sciatica. Will get lumbar xrays. Pt has a family hx of renal cancer and this scares her.  .. Results for orders placed or performed in visit on 10/11/14  POCT Urinalysis Dipstick  Result Value Ref Range   Color, UA YELLOW    Clarity, UA CLEAR    Glucose, UA NEG    Bilirubin, UA NEG    Ketones, UA NEG    Spec Grav, UA 1.020    Blood, UA NEG    pH, UA 5.5    Protein, UA     Urobilinogen, UA negative    Nitrite, UA NEG    Leukocytes, UA Negative    ]no blood in urine and looks good.  Discuss I do not have a high suspcion for anything going  on at kidney. I would like to treat as muculoskelatal and then if no improvement can look at kidneys. Discussed UA is very reassuring. I did give some SI joint exercise. Use ibuprofen and ice regular for next 2 weeks. If no improvement follow up with Dr. Dickey Gave area. Use ibuprofen PRN.

## 2014-10-14 DIAGNOSIS — M545 Low back pain, unspecified: Secondary | ICD-10-CM | POA: Insufficient documentation

## 2014-10-14 DIAGNOSIS — J0101 Acute recurrent maxillary sinusitis: Secondary | ICD-10-CM | POA: Insufficient documentation

## 2014-10-14 DIAGNOSIS — J309 Allergic rhinitis, unspecified: Secondary | ICD-10-CM | POA: Insufficient documentation

## 2014-10-19 ENCOUNTER — Ambulatory Visit (INDEPENDENT_AMBULATORY_CARE_PROVIDER_SITE_OTHER): Payer: Self-pay | Admitting: Psychiatry

## 2014-10-19 ENCOUNTER — Encounter (HOSPITAL_COMMUNITY): Payer: Self-pay | Admitting: Psychiatry

## 2014-10-19 VITALS — BP 102/75 | HR 92 | Ht 66.0 in | Wt 114.0 lb

## 2014-10-19 DIAGNOSIS — F310 Bipolar disorder, current episode hypomanic: Secondary | ICD-10-CM

## 2014-10-19 DIAGNOSIS — F319 Bipolar disorder, unspecified: Secondary | ICD-10-CM

## 2014-10-19 DIAGNOSIS — G4700 Insomnia, unspecified: Secondary | ICD-10-CM

## 2014-10-19 DIAGNOSIS — F419 Anxiety disorder, unspecified: Secondary | ICD-10-CM

## 2014-10-19 MED ORDER — LAMOTRIGINE 100 MG PO TABS: 100.0000 mg | ORAL_TABLET | Freq: Two times a day (BID) | ORAL | Status: DC

## 2014-10-19 MED ORDER — TRAZODONE HCL 50 MG PO TABS: 50.0000 mg | ORAL_TABLET | Freq: Every day | ORAL | Status: DC

## 2014-10-19 NOTE — Progress Notes (Signed)
Patient ID: Kellie [name], female   DOB: [birth year], 59 y.o.   MRN: [id number]   [facility] Follow up visit  Kellie [name] [id number] 59 y.o.  10/19/2014 3:01 PM  Chief Complaint:  Racing toughts at night . Difficult to sleep  History of Present Illness:   Patient  Initially presented with racing toughts and cant sleep at night.  She has had improvement in her spiritual thoughts and mind racing with the Lamictal. She is not seeing things. She still remains concerned about her difficulty sleeping and insomnia. That keeps her more frustrated during the day and still contributes to her agitation. Otherwise depression is improved. She does not believe her mind is racing as it has before but it does affect her still at night. Last visit we added Vistaril for sleep and agitation but it made her feel drowsy but not helped her sleep so she stopped taking it.  Modifying factors; she spiritual she feels connected with God. She talks about her kids.  Severity of depression; 6 out of 10. 10 being no depression  Context; feeling lonely, worried about her age and finances  Worries are there but not excessive unreasonable. She makes concerned about her sleep and moodiness.   Denies drug, alcohol abuse or use.  Past Psychiatric History/Hospitalization(s) denies  Hospitalization for psychiatric illness: No History of Electroconvulsive Shock Therapy: No Prior Suicide Attempts: No  Medical History; Past Medical History  Diagnosis Date  . Hyperlipidemia   . Allergic rhinitis 11/08/2010  . Osteoporosis 11/21/2011  . Asthma   . Bronchitis   . Anxiety   . Mitral valve prolapse   . Herpes simplex of female genitalia   . Abnormal Pap smear of cervix     Allergies: Allergies  Allergen Reactions  . Hydrocodone Nausea And Vomiting  . Codeine Nausea And Vomiting  . Morphine And Related Nausea And Vomiting  . Percocet [Oxycodone-Acetaminophen] Nausea And Vomiting and Other (See  Comments)    Patient states it makes blood pressure drop too much    Medications: Outpatient Encounter Prescriptions as of 10/19/2014  Medication Sig  . acyclovir (ZOVIRAX) 200 MG capsule Take 2 capsules (400 mg total) by mouth 2 (two) times daily.  [name] Kitchen amoxicillin (AMOXIL) 500 MG capsule Take 1 capsule (500 mg total) by mouth 2 (two) times daily.  . Beclomethasone Dipropionate 80 MCG/ACT AERS Place 2 sprays into the nose daily.  . fluticasone (FLONASE) 50 MCG/ACT nasal spray One spray in each nostril twice a day, use left hand for right nostril, and right hand for left nostril.  [name] Kitchen ibuprofen (ADVIL,MOTRIN) 800 MG tablet Take 1 tablet (800 mg total) by mouth every 8 (eight) hours as needed for mild pain.  [name] Kitchen lamoTRIgine (LAMICTAL) 100 MG tablet Take 1 tablet (100 mg total) by mouth 2 (two) times daily.  . [DISCONTINUED] lamoTRIgine (LAMICTAL) 100 MG tablet Take 1 tablet (100 mg total) by mouth 2 (two) times daily.  [name] Kitchen loratadine (CLARITIN) 10 MG tablet Take 1 tablet (10 mg total) by mouth daily. (Patient not taking: Reported on 10/19/2014)  . traZODone (DESYREL) 50 MG tablet Take 1 tablet (50 mg total) by mouth at bedtime.  . [DISCONTINUED] hydrOXYzine (ATARAX/VISTARIL) 25 MG tablet Take 1 tablet (25 mg total) by mouth daily as needed. (Patient not taking: Reported on 10/19/2014)     Substance Abuse History: Denies  Family History; Family History  Problem Relation Age of Onset  . Kidney cancer Mother 46  . COPD Mother   .  Cancer Mother     renal  . Kidney disease Mother   . Heart disease Father   . COPD Sister   . Depression Sister   . Heart disease Brother   . Heart attack Brother   . Heart disease Paternal Uncle   . Colon cancer Neg Hx   . Rectal cancer Neg Hx   . Stomach cancer Neg Hx   . Colon polyps Neg Hx   . Diabetes      neice  . Thyroid disease      neice  . Depression Maternal Aunt   . Depression Maternal Grandfather        Labs:  Recent Results (from the past  2160 hour(s))  POCT Urinalysis Dipstick     Status: None   Collection Time: 10/11/14 10:09 AM  Result Value Ref Range   Color, UA YELLOW    Clarity, UA CLEAR    Glucose, UA NEG    Bilirubin, UA NEG    Ketones, UA NEG    Spec Grav, UA 1.020    Blood, UA NEG    pH, UA 5.5    Protein, UA     Urobilinogen, UA negative    Nitrite, UA NEG    Leukocytes, UA Negative        Musculoskeletal: Strength & Muscle Tone: within normal limits Gait & Station: normal Patient leans: N/A  Mental Status Examination;   Psychiatric Specialty Exam: Physical Exam  Constitutional: She appears well-developed and well-nourished.  Skin: She is not diaphoretic.    Review of Systems  Constitutional: Negative.   Gastrointestinal: Negative for nausea.  Skin: Negative for itching and rash.  Psychiatric/Behavioral: Negative for suicidal ideas and substance abuse. The patient has insomnia.     Blood pressure 102/75, pulse 92, height 5\' 6"  (1.676 m), weight 114 lb (51.71 kg).Body mass index is 18.41 kg/(m^2).  General Appearance: Casual  Eye Contact::  Fair  Speech:  coherent  Volume:  Normal  Mood:  labile but not hopeless  Affect:  congruent  Thought Process:  Denies delusions, hallucinations.  Orientation:  Full (Time, Place, and Person)  Thought Content:  Rumination  Suicidal Thoughts:  No  Homicidal Thoughts:  No  Memory:  Immediate;   Fair Recent;   Fair  Judgement:  Fair  Insight:  Shallow  Psychomotor Activity:  Increased  Concentration:  Fair  Recall:  Fair  Akathisia:  Negative  Handed:  Right  AIMS (if indicated):     Assets:  Desire for Improvement Physical Health Transportation  Sleep:        Assessment: Axis I: bipolar disorder II unspecified or depressed type. Anxiety disorder NOS. Insomnia  Axis II: deferred  Axis III:  Past Medical History  Diagnosis Date  . Hyperlipidemia   . Allergic rhinitis 11/08/2010  . Osteoporosis 11/21/2011  . Asthma   . Bronchitis    . Anxiety   . Mitral valve prolapse   . Herpes simplex of female genitalia   . Abnormal Pap smear of cervix     Axis IV: psychosocial   Treatment Plan and Summary:  Continue Lamictal 100 mg twice a day. Discontinue Vistaril.  Trazodone 50 mg at night for insomnia discussed sleep hygiene.   Pertinent Labs and Relevant Prior Notes reviewed. Medication Side effects, benefits and risks reviewed/discussed with Patient. Time given for patient to respond and asks questions regarding the Diagnosis and Medications. Safety concerns and to report to ER if suicidal or call 911. Relevant Medications  refilled or called in to pharmacy. Discussed weight maintenance and Sleep Hygiene. Follow up with Primary care provider in regards to Medical conditions. Recommend compliance with medications and follow up office appointments. Discussed to avail opportunity to consider or/and continue Individual therapy with Counselor. Greater than 50% of time was spend in counseling and coordination of care with the patient.  followup in one month.  Time spent: 25 minutes  Merian Capron, MD 10/19/2014

## 2014-11-16 ENCOUNTER — Other Ambulatory Visit: Payer: Self-pay | Admitting: Physician Assistant

## 2014-11-16 DIAGNOSIS — Z1231 Encounter for screening mammogram for malignant neoplasm of breast: Secondary | ICD-10-CM

## 2014-11-17 ENCOUNTER — Encounter (HOSPITAL_COMMUNITY): Payer: Self-pay | Admitting: Psychiatry

## 2014-11-17 ENCOUNTER — Ambulatory Visit (INDEPENDENT_AMBULATORY_CARE_PROVIDER_SITE_OTHER): Payer: Self-pay | Admitting: Psychiatry

## 2014-11-17 VITALS — BP 100/75 | HR 75 | Ht 66.0 in | Wt 116.0 lb

## 2014-11-17 DIAGNOSIS — G4700 Insomnia, unspecified: Secondary | ICD-10-CM

## 2014-11-17 DIAGNOSIS — F3181 Bipolar II disorder: Secondary | ICD-10-CM

## 2014-11-17 DIAGNOSIS — F419 Anxiety disorder, unspecified: Secondary | ICD-10-CM

## 2014-11-17 DIAGNOSIS — F310 Bipolar disorder, current episode hypomanic: Secondary | ICD-10-CM

## 2014-11-17 NOTE — Progress Notes (Signed)
Patient ID: Kellie [name], female   DOB: 11-30-1955, 59 y.o.   MRN: [id number]   [facility] Follow up visit  Kellie [name] [id number] 58 y.o.  11/17/2014 10:41 AM  Chief Complaint:  Racing toughts at night . Difficult to sleep  History of Present Illness:   Patient  Initially presented with racing toughts and cant sleep at night.  She has had improvement in her spiritual thoughts and mind racing with the Lamictal. Last visit she was not seeing things when not today she mentioned she saw spiders in the book and snake in the event. This happened only once and not constant. Still has received mind at night and difficulty sleeping. Last visit we added trazodone but she did not take it. She remains noncompliant with the recommendations and we are going to same thing about insomnia, compliance with medications and discussing about sleep hygiene. Mood does get irritable sometimes have difficulty controlling her emotions and poor frustration tolerance. We talked about psychotherapy she did not follow up with her appointments with that either   Modifying factors; she spiritual she feels connected with God. She talks about her kids.  Severity of depression; 6 out of 10. 10 being no depression  Context; feeling lonely, worried about her age and finances  Worries are there but not excessive unreasonable. She makes concerned about her sleep and moodiness.  Insomnia; persist which may be contributing to her inattention during the day and poor frustration tolerance Denies drug, alcohol abuse or use.  Past Psychiatric History/Hospitalization(s) denies  Hospitalization for psychiatric illness: No History of Electroconvulsive Shock Therapy: No Prior Suicide Attempts: No  Medical History; Past Medical History  Diagnosis Date  . Hyperlipidemia   . Allergic rhinitis 11/08/2010  . Osteoporosis 11/21/2011  . Asthma   . Bronchitis   . Anxiety   . Mitral valve prolapse   . Herpes  simplex of female genitalia   . Abnormal Pap smear of cervix     Allergies: Allergies  Allergen Reactions  . Hydrocodone Nausea And Vomiting  . Codeine Nausea And Vomiting  . Morphine And Related Nausea And Vomiting  . Percocet [Oxycodone-Acetaminophen] Nausea And Vomiting and Other (See Comments)    Patient states it makes blood pressure drop too much    Medications: Outpatient Encounter Prescriptions as of 11/17/2014  Medication Sig  . acyclovir (ZOVIRAX) 200 MG capsule Take 2 capsules (400 mg total) by mouth 2 (two) times daily.  [name] Kitchen amoxicillin (AMOXIL) 500 MG capsule Take 1 capsule (500 mg total) by mouth 2 (two) times daily.  . Beclomethasone Dipropionate 80 MCG/ACT AERS Place 2 sprays into the nose daily.  . fluticasone (FLONASE) 50 MCG/ACT nasal spray One spray in each nostril twice a day, use left hand for right nostril, and right hand for left nostril.  [name] Kitchen ibuprofen (ADVIL,MOTRIN) 800 MG tablet Take 1 tablet (800 mg total) by mouth every 8 (eight) hours as needed for mild pain.  [name] Kitchen lamoTRIgine (LAMICTAL) 100 MG tablet Take 1 tablet (100 mg total) by mouth 2 (two) times daily.  [name] Kitchen loratadine (CLARITIN) 10 MG tablet Take 1 tablet (10 mg total) by mouth daily. (Patient not taking: Reported on 10/19/2014)  . traZODone (DESYREL) 50 MG tablet Take 1 tablet (50 mg total) by mouth at bedtime.   No facility-administered encounter medications on file as of 11/17/2014.     Family History; Family History  Problem Relation Age of Onset  . Kidney cancer Mother 67  . COPD Mother   .  Cancer Mother     renal  . Kidney disease Mother   . Heart disease Father   . COPD Sister   . Depression Sister   . Heart disease Brother   . Heart attack Brother   . Heart disease Paternal Uncle   . Colon cancer Neg Hx   . Rectal cancer Neg Hx   . Stomach cancer Neg Hx   . Colon polyps Neg Hx   . Diabetes      neice  . Thyroid disease      neice  . Depression Maternal Aunt   . Depression Maternal  Grandfather        Labs:  Recent Results (from the past 2160 hour(s))  POCT Urinalysis Dipstick     Status: None   Collection Time: 10/11/14 10:09 AM  Result Value Ref Range   Color, UA YELLOW    Clarity, UA CLEAR    Glucose, UA NEG    Bilirubin, UA NEG    Ketones, UA NEG    Spec Grav, UA 1.020    Blood, UA NEG    pH, UA 5.5    Protein, UA     Urobilinogen, UA negative    Nitrite, UA NEG    Leukocytes, UA Negative        Musculoskeletal: Strength & Muscle Tone: within normal limits Gait & Station: normal Patient leans: N/A  Mental Status Examination;   Psychiatric Specialty Exam: Physical Exam  Constitutional: She appears well-developed and well-nourished.  Skin: She is not diaphoretic.    Review of Systems  Constitutional: Negative.   Skin: Negative for rash.  Neurological: Negative for headaches.  Psychiatric/Behavioral: The patient is nervous/anxious and has insomnia.     Blood pressure 100/75, pulse 75, height 5\' 6"  (1.676 m), weight 116 lb (52.617 kg).Body mass index is 18.73 kg/(m^2).  General Appearance: Casual  Eye Contact::  Fair  Speech:  coherent  Volume:  Normal  Mood:  labile but not hopeless  Affect:  congruent  Thought Process:  Infrequent seeing spider or objects .  Orientation:  Full (Time, Place, and Person)  Thought Content:  Rumination  Suicidal Thoughts:  No  Homicidal Thoughts:  No  Memory:  Immediate;   Fair Recent;   Fair  Judgement:  Fair  Insight:  Shallow  Psychomotor Activity:  Increased  Concentration:  Fair  Recall:  Fair  Akathisia:  Negative  Handed:  Right  AIMS (if indicated):     Assets:  Desire for Improvement Physical Health Transportation  Sleep:        Assessment: Axis I: bipolar disorder II unspecified or depressed type. Anxiety disorder NOS. Insomnia  Axis II: deferred  Axis III:  Past Medical History  Diagnosis Date  . Hyperlipidemia   . Allergic rhinitis 11/08/2010  . Osteoporosis 11/21/2011   . Asthma   . Bronchitis   . Anxiety   . Mitral valve prolapse   . Herpes simplex of female genitalia   . Abnormal Pap smear of cervix     Axis IV: psychosocial   Treatment Plan and Summary:  Continue Lamictal 100 mg twice a day.   We had the same point as of her last visit since she does not take the trazodone. She continues talk with her sleep problem so we talked about compliance with recommendations in case she wants to follow up with the recommendation. She says that she will take the trazodone and Seroquel as we also talked about with the most of  life including adding Seroquel to help the racing mind and sleep at night  I do highly recommend sure she follows up with psychotherapy in order to work on her anxiety, insomnia and medication compliance  Trazodone 50 mg at night for insomnia she has prescription.   Pertinent Labs and Relevant Prior Notes reviewed. Medication Side effects, benefits and risks reviewed/discussed with Patient. Time given for patient to respond and asks questions regarding the Diagnosis and Medications. Safety concerns and to report to ER if suicidal or call 911. Relevant Medications refilled or called in to pharmacy. Discussed weight maintenance and Sleep Hygiene. Follow up with Primary care provider in regards to Medical conditions. Recommend compliance with medications and follow up office appointments. Discussed to avail opportunity to consider or/and continue Individual therapy with Counselor. Greater than 50% of time was spend in counseling and coordination of care with the patient.  followup in one month.  Time spent: 25 minutes  Merian Capron, MD 11/17/2014

## 2014-11-18 ENCOUNTER — Ambulatory Visit (INDEPENDENT_AMBULATORY_CARE_PROVIDER_SITE_OTHER): Payer: Self-pay | Admitting: Licensed Clinical Social Worker

## 2014-11-18 DIAGNOSIS — F310 Bipolar disorder, current episode hypomanic: Secondary | ICD-10-CM

## 2014-11-18 NOTE — Progress Notes (Deleted)
Patient:   Kellie [name]   DOB:   [birth year]  MR Number:  [id number]  Location:  [facility] [address] [place] [address] Dept: [phone]           Date of Service:   11/18/14  Start Time:   10:00am End Time:   ***  Provider/Observer:  [facility]       Billing Code/Service: [id number]  Comprehensive Clinical Assessment  Information for assessment provided by: patient   Chief Complaint:    No chief complaint on file.      Presenting Problem/Symptoms:        Previous MH/SA diagnoses:      Mental Health Symptoms:    Depression:    Current symptoms include {symptoms:20000}.    Onset approximately {number:19994} {units:19995} ago, {course:19996} since that time.    Past episodes of depression:***  Anxiety:  {Symptoms; anxiety:15334}   Panic Attacks: {(BH) ABSENT/MARKED:20013}   Self-Harm Potential: Thoughts of Self-Harm: none, vague current thoughts, recurrent active thoughts Method: no plan, plan without intent, plan with intent Availability of means: no access, has close by, in hand Is there a family history of suicide? Previous attempts? Preoccupation with death? History of acts of self-harm?  Dangerousness to Others Potential: Method: no plan, plan without intent, plan with intent and identified person Availability of means: no access, has close by, in hand Intent: vague intent, intends to cause physical harm but not necessarily death, clearly intents on inflicting harm that could cause death Notification required?  No need, identified person is aware, identified person needs to be warned Family history of violence? Previous attempts?    Mania/hypomania: {CHL IP EXHBZ:1696789}        Psychosis:  { IP  BHH PSYCHOSIS, ALTERATION IN THOUGHT PROCESS:3045009}    Abuse/Trauma History: ***  PTSD symptoms: intrusive thoughts  about traumatic event, nightmares, flashbacks, panic symptoms upon coming into contact with reminders,  avoids reminders of the event, emotional numbing, guilt/shame, detachment from others, difficulty falling or staying asleep, hypervigilance, irritability/anger, exaggerated startle response      Obsessions: recurrent & persistent thoughts/impulses/images that cause anxiety, attempts to suppress or neutralize the thoughts, time-consuming, disrupts routine/functioning, good/poor/no insight    Compulsions: repeated behaviors/mental acts, "driven" to perform behaviors/acts, intended to reduce stress or prevent a negative outcome, not connected to an actual stressor, intrusive/time consuming, disrupts routine/functioning, good/poor/no insight    Inattention: fails to pay attention/makes careless mistakes, disorganized, easily distracted, does not seem to listen, does not follow instructions (not because of being oppositional), poor follow through on tasks, forgetful, loses things, avoids/dislikes activities that require focus, symptoms before age 6, symptoms present in 2 or more settings    Hyperactivity/Impulsivity: talks excessively, leaves seat when not appropriate, runs and climbs, always on the go, fidgets with hands/feet, hard time playing or engaging in leisure activities quietly, feelings of restlessness (teens and adults), blurts out answers, interrupts others, difficulty waiting turn, symptoms present before age 61, several symptoms present in 2 or more settings    Oppositional/Defiant Behaviors: temper, angry, resentful, argumentative, intentionally annoying, defies rules, easily annoyed, spiteful, blames others, aggression towards people or animals, destruction of property       Mental Status  Interactions:    {BHH PARTICIPATION LEVEL:22264}   Attention:   Good, distractible, variable  Memory:   Intact, poor remote, poor recent  Speech:   {BHH SPEECH:22304}   Flow of  Thought:  Normal, blocking, circumstantial, tangential, flight of ideas,                                                loose associations, perseveration  Thought Content:  {BHH THOUGHT CONTENT:22310}  Orientation:   {orientation:30299}  Judgment:   {BHH JUDGMENT:22312}  Affect/Mood:   {BHH AFFECT:22266}  Insight:   {Insight (PAA):22695}        Medical History:    Past Medical History  Diagnosis Date  . Hyperlipidemia   . Allergic rhinitis 11/08/2010  . Osteoporosis 11/21/2011  . Asthma   . Bronchitis   . Anxiety   . Mitral valve prolapse   . Herpes simplex of female genitalia   . Abnormal Pap smear of cervix      Current medications:         Outpatient Encounter Prescriptions as of 11/18/2014  Medication Sig  . acyclovir (ZOVIRAX) 200 MG capsule Take 2 capsules (400 mg total) by mouth 2 (two) times daily.  [name] Kitchen amoxicillin (AMOXIL) 500 MG capsule Take 1 capsule (500 mg total) by mouth 2 (two) times daily.  . Beclomethasone Dipropionate 80 MCG/ACT AERS Place 2 sprays into the nose daily.  . fluticasone (FLONASE) 50 MCG/ACT nasal spray One spray in each nostril twice a day, use left hand for right nostril, and right hand for left nostril.  [name] Kitchen ibuprofen (ADVIL,MOTRIN) 800 MG tablet Take 1 tablet (800 mg total) by mouth every 8 (eight) hours as needed for mild pain.  [name] Kitchen lamoTRIgine (LAMICTAL) 100 MG tablet Take 1 tablet (100 mg total) by mouth 2 (two) times daily.  [name] Kitchen loratadine (CLARITIN) 10 MG tablet Take 1 tablet (10 mg total) by mouth daily. (Patient not taking: Reported on 10/19/2014)  . traZODone (DESYREL) 50 MG tablet Take 1 tablet (50 mg total) by mouth at bedtime.   No facility-administered encounter medications on file as of 11/18/2014.              Mental Health/Substance Use Treatment History:         Family Med/Psych History:  Family History  Problem Relation Age of Onset  . Kidney cancer Mother 68  . COPD Mother   . Cancer Mother     renal  .  Kidney disease Mother   . Heart disease Father   . COPD Sister   . Depression Sister   . Heart disease Brother   . Heart attack Brother   . Heart disease Paternal Uncle   . Colon cancer Neg Hx   . Rectal cancer Neg Hx   . Stomach cancer Neg Hx   . Colon polyps Neg Hx   . Diabetes      neice  . Thyroid disease      neice  . Depression Maternal Aunt   . Depression Maternal Grandfather        Substance Use History:  Include age of first use, frequency, last use, average amount per day  Alcohol? Cannabis? Tobacco? Opioids? Hallucinogens? Inhalents? Sedatives? Stimulants (Cocaine, amphetamine, other)?   Substance Use Disorder Checklist: Evidence of tolerance? Evidence of withdrawal? Substance often taken in larger amounts or over longer times than was intended? Persistent desire or unsuccessful efforts to cut down or control use? Large amounts of time spent to obtain, use or recover from the substance? Recurrent use results in failure to fulfill major  role obligations (work, school, home)? Social, occupational, recreational activities given up or reduced due to use? Continued use despite persistent or recurrent social, interpersonal problems caused or exacerbated by use? Presence of craving or strong urge to use? Repeated use in physically hazardous situations? Continued use despite having physical/psychological problems caused or exacerbated by use?  2-3 symptoms = mild 4-5 symptoms = moderate 6+ = severe  What kind of withdrawal symptoms have you experienced? Shakes, nausea, vomiting, sensitivity to light, headaches, body aches, seizure activity, delirium tremens (DTs, disorientation, hallucinations)  Have there been periods of sobriety?    Marital Status: ***  Lives with: ***  Family Relationships: ***  Other Social Supports: ***  Current Employment: ***  Past Employment:  ***  Education:   Water quality scientist History:  Surveyor, minerals  Involvement: ***  Religion/Spirituality:  ***  Hobbies:  ***  Strengths/Protective Factors: ***        Impression/DX:  ***  Disposition/Plan:  ***
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
## 2014-12-08 ENCOUNTER — Ambulatory Visit (INDEPENDENT_AMBULATORY_CARE_PROVIDER_SITE_OTHER): Payer: Self-pay

## 2014-12-08 DIAGNOSIS — Z1231 Encounter for screening mammogram for malignant neoplasm of breast: Secondary | ICD-10-CM

## 2014-12-20 ENCOUNTER — Telehealth (HOSPITAL_COMMUNITY): Payer: Self-pay | Admitting: *Deleted

## 2014-12-20 MED ORDER — TRAZODONE HCL 50 MG PO TABS: 50.0000 mg | ORAL_TABLET | Freq: Every day | ORAL | Status: DC

## 2014-12-20 MED ORDER — LAMOTRIGINE 100 MG PO TABS: 100.0000 mg | ORAL_TABLET | Freq: Two times a day (BID) | ORAL | Status: DC

## 2014-12-20 NOTE — Telephone Encounter (Signed)
Pt called for a refill for Lamictal 100mg  and Trazodone 50mg . Per Dr. De Nurse, pt is authorized for a refill for Lamictal 100mg , Qty 60 and Trazodone 50mg , Qty 30. Prescription was sent to pharmacy. Pt has a f/u appt on 7/26. Pt states and shows understanding.

## 2015-01-18 ENCOUNTER — Encounter (HOSPITAL_COMMUNITY): Payer: Self-pay | Admitting: Psychiatry

## 2015-01-18 ENCOUNTER — Ambulatory Visit (INDEPENDENT_AMBULATORY_CARE_PROVIDER_SITE_OTHER): Payer: Self-pay | Admitting: Psychiatry

## 2015-01-18 VITALS — BP 122/70 | HR 74 | Ht 66.0 in | Wt 115.8 lb

## 2015-01-18 DIAGNOSIS — F310 Bipolar disorder, current episode hypomanic: Secondary | ICD-10-CM

## 2015-01-18 DIAGNOSIS — G4700 Insomnia, unspecified: Secondary | ICD-10-CM

## 2015-01-18 DIAGNOSIS — F3181 Bipolar II disorder: Secondary | ICD-10-CM

## 2015-01-18 DIAGNOSIS — F419 Anxiety disorder, unspecified: Secondary | ICD-10-CM

## 2015-01-18 MED ORDER — HYDROXYZINE PAMOATE 25 MG PO CAPS: 25.0000 mg | ORAL_CAPSULE | Freq: Every day | ORAL | Status: DC

## 2015-01-18 MED ORDER — TRAZODONE HCL 50 MG PO TABS: 50.0000 mg | ORAL_TABLET | Freq: Every day | ORAL | Status: DC

## 2015-01-18 MED ORDER — LAMOTRIGINE 100 MG PO TABS: 100.0000 mg | ORAL_TABLET | Freq: Two times a day (BID) | ORAL | Status: DC

## 2015-01-18 NOTE — Progress Notes (Signed)
Patient ID: Kellie [name], female   DOB: [birth year], 59 y.o.   MRN: [id number]   [facility] Follow up Outpatient visit  Kellie [name] [id number] 59 y.o.  01/18/2015 11:44 AM  Chief Complaint:  Racing toughts at night . Follow up.  History of Present Illness:   Patient  Initially presented with racing toughts and cant sleep at night.  She has had improvement in her spiritual thoughts and mind racing with the Lamictal.  Her mood is improved with lamictal and less racy. Vistaril helps her anxiety and sleep. She alternates trazadone with vistaril.   Modifying factors; she spiritual she feels connected with God. She talks about her kids.  Severity of depression; 6 out of 10. 10 being no depression  Context; feeling lonely, worried about her age and finances   Denies drug, alcohol abuse or use.  Past Psychiatric History/Hospitalization(s) denies  Hospitalization for psychiatric illness: No History of Electroconvulsive Shock Therapy: No Prior Suicide Attempts: No  Medical History; Past Medical History  Diagnosis Date  . Hyperlipidemia   . Allergic rhinitis 11/08/2010  . Osteoporosis 11/21/2011  . Asthma   . Bronchitis   . Anxiety   . Mitral valve prolapse   . Herpes simplex of female genitalia   . Abnormal Pap smear of cervix     Allergies: Allergies  Allergen Reactions  . Hydrocodone Nausea And Vomiting  . Codeine Nausea And Vomiting  . Morphine And Related Nausea And Vomiting  . Percocet [Oxycodone-Acetaminophen] Nausea And Vomiting and Other (See Comments)    Patient states it makes blood pressure drop too much    Medications: Outpatient Encounter Prescriptions as of 01/18/2015  Medication Sig  . acyclovir (ZOVIRAX) 200 MG capsule Take 2 capsules (400 mg total) by mouth 2 (two) times daily.  [name] Kitchen amoxicillin (AMOXIL) 500 MG capsule Take 1 capsule (500 mg total) by mouth 2 (two) times daily.  . Beclomethasone Dipropionate 80 MCG/ACT AERS Place 2  sprays into the nose daily.  . fluticasone (FLONASE) 50 MCG/ACT nasal spray One spray in each nostril twice a day, use left hand for right nostril, and right hand for left nostril.  . hydrOXYzine (VISTARIL) 25 MG capsule Take 1 capsule (25 mg total) by mouth daily.  [name] Kitchen ibuprofen (ADVIL,MOTRIN) 800 MG tablet Take 1 tablet (800 mg total) by mouth every 8 (eight) hours as needed for mild pain.  [name] Kitchen lamoTRIgine (LAMICTAL) 100 MG tablet Take 1 tablet (100 mg total) by mouth 2 (two) times daily.  [name] Kitchen loratadine (CLARITIN) 10 MG tablet Take 1 tablet (10 mg total) by mouth daily.  . traZODone (DESYREL) 50 MG tablet Take 1 tablet (50 mg total) by mouth at bedtime.  . [DISCONTINUED] hydrOXYzine (VISTARIL) 25 MG capsule Take 25 mg by mouth daily.  . [DISCONTINUED] lamoTRIgine (LAMICTAL) 100 MG tablet Take 1 tablet (100 mg total) by mouth 2 (two) times daily.  . [DISCONTINUED] traZODone (DESYREL) 50 MG tablet Take 1 tablet (50 mg total) by mouth at bedtime.   No facility-administered encounter medications on file as of 01/18/2015.     Family History; Family History  Problem Relation Age of Onset  . Kidney cancer Mother 66  . COPD Mother   . Cancer Mother     renal  . Kidney disease Mother   . Heart disease Father   . COPD Sister   . Depression Sister   . Heart disease Brother   . Heart attack Brother   . Heart disease Paternal Uncle   .  Colon cancer Neg Hx   . Rectal cancer Neg Hx   . Stomach cancer Neg Hx   . Colon polyps Neg Hx   . Diabetes      neice  . Thyroid disease      neice  . Depression Maternal Aunt   . Depression Maternal Grandfather        Labs:  No results found for this or any previous visit (from the past 2160 hour(s)).     Musculoskeletal: Strength & Muscle Tone: within normal limits Gait & Station: normal Patient leans: N/A  Mental Status Examination;   Psychiatric Specialty Exam: Physical Exam  Constitutional: She appears well-developed and well-nourished.   Skin: She is not diaphoretic.    Review of Systems  Constitutional: Negative for fever.  Skin: Negative for rash.  Neurological: Negative for tremors and headaches.  Psychiatric/Behavioral: The patient has insomnia.     Blood pressure 122/70, pulse 74, height 5\' 6"  (1.676 m), weight 115 lb 12.8 oz (52.527 kg), SpO2 97 %.Body mass index is 18.7 kg/(m^2).  General Appearance: Casual  Eye Contact::  Fair  Speech:  coherent  Volume:  Normal  Mood:  labile but not hopeless  Affect:  congruent  Thought Process:  Infrequent seeing spider or objects .  Orientation:  Full (Time, Place, and Person)  Thought Content:  Rumination  Suicidal Thoughts:  No  Homicidal Thoughts:  No  Memory:  Immediate;   Fair Recent;   Fair  Judgement:  Fair  Insight:  Shallow  Psychomotor Activity:  Increased  Concentration:  Fair  Recall:  Fair  Akathisia:  Negative  Handed:  Right  AIMS (if indicated):     Assets:  Desire for Improvement Physical Health Transportation  Sleep:        Assessment: Axis I: bipolar disorder II unspecified or depressed type. Anxiety disorder NOS. Insomnia  Axis II: deferred  Axis III:  Past Medical History  Diagnosis Date  . Hyperlipidemia   . Allergic rhinitis 11/08/2010  . Osteoporosis 11/21/2011  . Asthma   . Bronchitis   . Anxiety   . Mitral valve prolapse   . Herpes simplex of female genitalia   . Abnormal Pap smear of cervix     Axis IV: psychosocial   Treatment Plan and Summary:  Continue Lamictal 100 mg twice a day for mood symptoms of bipolar. No rash reported.   Alternate trazadone and vistaril for sleep. Prn vistaril for anxiety  I do highly recommend sure she follows up with psychotherapy in order to work on her anxiety, insomnia and medication compliance  Pertinent Labs and Relevant Prior Notes reviewed. Medication Side effects, benefits and risks reviewed/discussed with Patient. Time given for patient to respond and asks questions  regarding the Diagnosis and Medications. Safety concerns and to report to ER if suicidal or call 911. Relevant Medications refilled or called in to pharmacy. Discussed weight maintenance and Sleep Hygiene. Follow up with Primary care provider in regards to Medical conditions. Recommend compliance with medications and follow up office appointments. Discussed to avail opportunity to consider or/and continue Individual therapy with Counselor. Greater than 50% of time was spend in counseling and coordination of care with the patient.  followup in one month.  Merian Capron, MD 01/18/2015

## 2015-01-20 ENCOUNTER — Telehealth: Payer: Self-pay | Admitting: Family Medicine

## 2015-01-20 NOTE — Telephone Encounter (Signed)
Please order: Cmp. Lipid, A1C

## 2015-01-20 NOTE — Telephone Encounter (Signed)
Pt called and scheduled a physical with you on aug 17. She really wants her blood work done before the appt because she has some concerns and she is willing to do it Monday morning if you are able to send for the blood work. Thanks

## 2015-01-21 ENCOUNTER — Telehealth: Payer: Self-pay | Admitting: *Deleted

## 2015-01-21 DIAGNOSIS — Z0000 Encounter for general adult medical examination without abnormal findings: Secondary | ICD-10-CM

## 2015-01-21 DIAGNOSIS — R7303 Prediabetes: Secondary | ICD-10-CM

## 2015-01-21 NOTE — Telephone Encounter (Signed)
Labs ordered.

## 2015-01-21 NOTE — Telephone Encounter (Signed)
Labs ordered & faxed to [facility].  Pt notified.

## 2015-01-25 ENCOUNTER — Telehealth: Payer: Self-pay | Admitting: Family Medicine

## 2015-01-25 DIAGNOSIS — R635 Abnormal weight gain: Secondary | ICD-10-CM

## 2015-01-25 LAB — HEMOGLOBIN A1C
HEMOGLOBIN A1C: 5.5 % (ref <5.7)
MEAN PLASMA GLUCOSE: 111 mg/dL (ref <117)

## 2015-01-25 NOTE — Telephone Encounter (Signed)
Lab was ordered in the past, will reorder to see if any change. Lab order faxed down.

## 2015-01-25 NOTE — Telephone Encounter (Signed)
Patient is requesting to have TSH added to her blood work for her upcoming physical with you on 8/17. The lab drew enough blood, they just needed order sent down.  She said that she is having issues with weight gain and thinks it maybe hormonal.  thanks

## 2015-01-26 LAB — COMPLETE METABOLIC PANEL WITH GFR
ALBUMIN: 4.4 g/dL (ref 3.6–5.1)
ALK PHOS: 44 U/L (ref 33–130)
ALT: 12 U/L (ref 6–29)
AST: 18 U/L (ref 10–35)
BUN: 17 mg/dL (ref 7–25)
CHLORIDE: 104 mmol/L (ref 98–110)
CO2: 27 mmol/L (ref 20–31)
Calcium: 9.6 mg/dL (ref 8.6–10.4)
Creat: 0.7 mg/dL (ref 0.50–0.99)
Glucose, Bld: 76 mg/dL (ref 65–99)
Potassium: 4.8 mmol/L (ref 3.5–5.3)
Sodium: 144 mmol/L (ref 135–146)
Total Bilirubin: 0.5 mg/dL (ref 0.2–1.2)
Total Protein: 6.5 g/dL (ref 6.1–8.1)

## 2015-01-26 LAB — LIPID PANEL
Cholesterol: 208 mg/dL — ABNORMAL HIGH (ref 125–200)
HDL: 92 mg/dL (ref >=46)
LDL CALC: 107 mg/dL (ref <130)
TRIGLYCERIDES: 44 mg/dL (ref <150)
Total CHOL/HDL Ratio: 2.3 Ratio (ref <=5.0)
VLDL: 9 mg/dL (ref <30)

## 2015-02-08 ENCOUNTER — Telehealth: Payer: Self-pay

## 2015-02-08 NOTE — Telephone Encounter (Signed)
Patient will reschedule her CPE.

## 2015-02-09 ENCOUNTER — Encounter: Payer: Self-pay | Admitting: Family Medicine

## 2015-02-09 ENCOUNTER — Ambulatory Visit (INDEPENDENT_AMBULATORY_CARE_PROVIDER_SITE_OTHER): Payer: Self-pay | Admitting: Family Medicine

## 2015-02-09 VITALS — BP 105/66 | HR 71 | Temp 97.8°F | Wt 115.0 lb

## 2015-02-09 DIAGNOSIS — J329 Chronic sinusitis, unspecified: Secondary | ICD-10-CM | POA: Insufficient documentation

## 2015-02-09 DIAGNOSIS — R55 Syncope and collapse: Secondary | ICD-10-CM

## 2015-02-09 DIAGNOSIS — J0111 Acute recurrent frontal sinusitis: Secondary | ICD-10-CM

## 2015-02-09 DIAGNOSIS — R42 Dizziness and giddiness: Secondary | ICD-10-CM

## 2015-02-09 MED ORDER — DOXYCYCLINE HYCLATE 100 MG PO TABS: 100.0000 mg | ORAL_TABLET | Freq: Two times a day (BID) | ORAL | Status: DC

## 2015-02-09 MED ORDER — PREDNISONE 10 MG PO TABS: 30.0000 mg | ORAL_TABLET | Freq: Every day | ORAL | Status: DC

## 2015-02-09 NOTE — Patient Instructions (Signed)
Thank you for coming in today. Take prednsione for 5 days.  Take doxycycline if not getting better.  Call or go to the emergency room if you get worse, have trouble breathing, have chest pains, or palpitations.   Sinusitis Sinusitis is redness, soreness, and inflammation of the paranasal sinuses. Paranasal sinuses are air pockets within the bones of your face (beneath the eyes, the middle of the forehead, or above the eyes). In healthy paranasal sinuses, mucus is able to drain out, and air is able to circulate through them by way of your nose. However, when your paranasal sinuses are inflamed, mucus and air can become trapped. This can allow bacteria and other germs to grow and cause infection. Sinusitis can develop quickly and last only a short time (acute) or continue over a long period (chronic). Sinusitis that lasts for more than 12 weeks is considered chronic.  CAUSES  Causes of sinusitis include:  Allergies.  Structural abnormalities, such as displacement of the cartilage that separates your nostrils (deviated septum), which can decrease the air flow through your nose and sinuses and affect sinus drainage.  Functional abnormalities, such as when the small hairs (cilia) that line your sinuses and help remove mucus do not work properly or are not present. SIGNS AND SYMPTOMS  Symptoms of acute and chronic sinusitis are the same. The primary symptoms are pain and pressure around the affected sinuses. Other symptoms include:  Upper toothache.  Earache.  Headache.  Bad breath.  Decreased sense of smell and taste.  A cough, which worsens when you are lying flat.  Fatigue.  Fever.  Thick drainage from your nose, which often is green and may contain pus (purulent).  Swelling and warmth over the affected sinuses. DIAGNOSIS  Your health care provider will perform a physical exam. During the exam, your health care provider may:  Look in your nose for signs of abnormal growths in  your nostrils (nasal polyps).  Tap over the affected sinus to check for signs of infection.  View the inside of your sinuses (endoscopy) using an imaging device that has a light attached (endoscope). If your health care provider suspects that you have chronic sinusitis, one or more of the following tests may be recommended:  Allergy tests.  Nasal culture. A sample of mucus is taken from your nose, sent to a lab, and screened for bacteria.  Nasal cytology. A sample of mucus is taken from your nose and examined by your health care provider to determine if your sinusitis is related to an allergy. TREATMENT  Most cases of acute sinusitis are related to a viral infection and will resolve on their own within 10 days. Sometimes medicines are prescribed to help relieve symptoms (pain medicine, decongestants, nasal steroid sprays, or saline sprays).  However, for sinusitis related to a bacterial infection, your health care provider will prescribe antibiotic medicines. These are medicines that will help kill the bacteria causing the infection.  Rarely, sinusitis is caused by a fungal infection. In theses cases, your health care provider will prescribe antifungal medicine. For some cases of chronic sinusitis, surgery is needed. Generally, these are cases in which sinusitis recurs more than 3 times per year, despite other treatments. HOME CARE INSTRUCTIONS   Drink plenty of water. Water helps thin the mucus so your sinuses can drain more easily.  Use a humidifier.  Inhale steam 3 to 4 times a day (for example, sit in the bathroom with the shower running).  Apply a warm, moist washcloth to  your face 3 to 4 times a day, or as directed by your health care provider.  Use saline nasal sprays to help moisten and clean your sinuses.  Take medicines only as directed by your health care provider.  If you were prescribed either an antibiotic or antifungal medicine, finish it all even if you start to feel  better. SEEK IMMEDIATE MEDICAL CARE IF:  You have increasing pain or severe headaches.  You have nausea, vomiting, or drowsiness.  You have swelling around your face.  You have vision problems.  You have a stiff neck.  You have difficulty breathing. MAKE SURE YOU:   Understand these instructions.  Will watch your condition.  Will get help right away if you are not doing well or get worse. Document Released: 06/11/2005 Document Revised: 10/26/2013 Document Reviewed: 06/26/2011 [facility] Patient Information 2015 [place], [place]. This information is not intended to replace advice given to you by your health care provider. Make sure you discuss any questions you have with your health care provider.   Near-Syncope Near-syncope (commonly known as near fainting) is sudden weakness, dizziness, or feeling like you might pass out. During an episode of near-syncope, you may also develop pale skin, have tunnel vision, or feel sick to your stomach (nauseous). Near-syncope may occur when getting up after sitting or while standing for a long time. It is caused by a sudden decrease in blood flow to the brain. This decrease can result from various causes or triggers, most of which are not serious. However, because near-syncope can sometimes be a sign of something serious, a medical evaluation is required. The specific cause is often not determined. HOME CARE INSTRUCTIONS  Monitor your condition for any changes. The following actions may help to alleviate any discomfort you are experiencing:  Have someone stay with you until you feel stable.  Lie down right away and prop your feet up if you start feeling like you might faint. Breathe deeply and steadily. Wait until all the symptoms have passed. Most of these episodes last only a few minutes. You may feel tired for several hours.   Drink enough fluids to keep your urine clear or pale yellow.   If you are taking blood pressure or heart medicine, get  up slowly when seated or lying down. Take several minutes to sit and then stand. This can reduce dizziness.  Follow up with your health care provider as directed. SEEK IMMEDIATE MEDICAL CARE IF:   You have a severe headache.   You have unusual pain in the chest, abdomen, or back.   You are bleeding from the mouth or rectum, or you have black or tarry stool.   You have an irregular or very fast heartbeat.   You have repeated fainting or have seizure-like jerking during an episode.   You faint when sitting or lying down.   You have confusion.   You have difficulty walking.   You have severe weakness.   You have vision problems.  MAKE SURE YOU:   Understand these instructions.  Will watch your condition.  Will get help right away if you are not doing well or get worse. Document Released: 06/11/2005 Document Revised: 06/16/2013 Document Reviewed: 11/14/2012 [facility] Patient Information 2015 [place], [place]. This information is not intended to replace advice given to you by your health care provider. Make sure you discuss any questions you have with your health care provider.

## 2015-02-09 NOTE — Progress Notes (Signed)
Kellie [name]: Sinus pressure and right ear itching. This is been ongoing for a few weeks. This is despite taking Flonase Claritin and saline nasal spray. Patient states that she feels as though she has a bacterial sinus infection and would like doxycycline for this complaint. She also notes a foul taste in her mouth.  Additionally patient notes a dizzy sensation. She describes a feeling as if she will pass out when she stands up from a seated position. This is been ongoing for months. She denies any chest pains palpitations or shortness of breath especially during the sensation of presyncope. She denies any vertigo sensation. She has not tried any medicines for this. She feels well otherwise.  Exam:  Weight 115 pounds, temperature 97.47F, heart rate 71, blood pressure 105/66   Orthostatic VS for the past 24 hrs:  BP- Lying Pulse- Lying BP- Sitting Pulse- Sitting BP- Standing at 0 minutes Pulse- Standing at 0 minutes  02/09/15 0910 110/66 mmHg 70 105/66 mmHg 71 94/63 mmHg 86      Gen: Well NAD HEENT: EOMI,  MMM posterior pharynx cobblestoning. Maxillary frontal sinuses are nontender. Right tympanic membrane with mild effusion without erythema. Left is normal. Lungs: CTABL Nl WOB Heart: RRR no MRG Abd: NABS, NT, ND Exts: Non edematous BL  LE, warm and well perfused.  12-lead EKG: Normal sinus rhythm at 69 bpm. No ST segment elevation or depression. Normal intervals. Normal axis. Normal EKG

## 2015-02-09 NOTE — Assessment & Plan Note (Signed)
Sinusitis likely viral. Treat with prednisone. Use doxycycline if not better.

## 2015-02-09 NOTE — Assessment & Plan Note (Signed)
Mild orthostasis. Patient denies any actual syncope. Symptoms are mild and patient does not wish to pursue this further at this time. We'll continue to follow

## 2015-02-25 ENCOUNTER — Ambulatory Visit (INDEPENDENT_AMBULATORY_CARE_PROVIDER_SITE_OTHER): Payer: Self-pay | Admitting: Physician Assistant

## 2015-02-25 VITALS — BP 118/66 | HR 68 | Ht 66.0 in | Wt 115.0 lb

## 2015-02-25 DIAGNOSIS — R0981 Nasal congestion: Secondary | ICD-10-CM

## 2015-02-25 DIAGNOSIS — R5383 Other fatigue: Secondary | ICD-10-CM

## 2015-02-25 DIAGNOSIS — G5701 Lesion of sciatic nerve, right lower limb: Secondary | ICD-10-CM

## 2015-02-25 DIAGNOSIS — M533 Sacrococcygeal disorders, not elsewhere classified: Secondary | ICD-10-CM | POA: Insufficient documentation

## 2015-02-25 DIAGNOSIS — Z0000 Encounter for general adult medical examination without abnormal findings: Secondary | ICD-10-CM

## 2015-02-25 DIAGNOSIS — H43391 Other vitreous opacities, right eye: Secondary | ICD-10-CM

## 2015-02-25 LAB — TSH: TSH: 1.436 u[IU]/mL (ref 0.350–4.500)

## 2015-02-25 LAB — VITAMIN B12: VITAMIN B 12: 637 pg/mL (ref 211–911)
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
## 2015-02-25 NOTE — Patient Instructions (Addendum)
PT in [place].   Eye Floaters A jelly-like fluid fills the inside of the eye and is called the vitreous. The vitreous is normally clear. It allows light to pass through to the back of the eye to the tissues that contain the nerves needed for vision (the retina). With age, the vitreous can start to decline. If a decline happens, specks of material from clumps of cells, blood, or other materials may start to float around inside the eye. These objects cast shadows on the retina. These shadows are seen as moving strings, streaks, "bugs," dust or spider webs floating in front of the eye. CAUSES   Age.  A high degree of near-sightedness (high myopia).  Tears in the retina.  Bleeding inside the eye from broken retinal blood vessels as a result of disease (diabetes, inflammation of the retinal blood vessels, and others).  Blood clot of the major vein of the retina or its branches (retinal vein occlusion).  Trauma.  Retinal detachment.  Vitreous detachment.  Eye surgery.  Inflammation inside the eye (uveitis).  Infection inside the eye. SYMPTOMS   Seeing floating specs, dots or spider webs in the vision of one eye. This can sometimes be associated with flashes of light seen off to the side.  Bleeding in the eye may begin as floaters and lead to complete vision loss as the vitreous fills with blood. This may happen repeatedly in certain diseases of the blood vessels of the retina (e.g. diabetes).  If the vitreous shrinks enough to pull away from the retina (posterior vitreous detachment), a small circular ring-shaped floater may be seen. Migraine headaches may be associated with many forms of visual symptoms (sparkling dots, wavy lines) just before the headache strikes. These symptoms due to migraine are not from floaters. They will disappear when the headache goes away. DIAGNOSIS  An eye professional can tell you if you have floaters during an eye exam. TREATMENT  There is no treatment  for the floaters themselves.  If the floaters are due to a tear in the retina, a retinal detachment or other eye disease, the condition that caused the floaters must be treated.  Floaters due to blood in the eye often go away or lessen with time. SEEK MEDICAL CARE IF:   You suddenly see floating dots or spider webs in front of the vision of one or both eyes. This is especially true if you also see flashes of light off to the side (like flashes of lightening).  You see floaters and also notice a change or drop in your vision in either eye. Document Released: 06/14/2003 Document Revised: 09/03/2011 Document Reviewed: 10/09/2007 [facility] Patient Information 2015 [place], [place]. This information is not intended to replace advice given to you by your health care provider. Make sure you discuss any questions you have with your health care provider. Keeping You Healthy  Get These Tests  Blood Pressure- Have your blood pressure checked by your healthcare provider at least once a year.  Normal blood pressure is 120/80.  Weight- Have your body mass index (BMI) calculated to screen for obesity.  BMI is a measure of body fat based on height and weight.  You can calculate your own BMI at [employer].it  Cholesterol- Have your cholesterol checked every year.  Diabetes- Have your blood sugar checked every year if you have high blood pressure, high cholesterol, a family history of diabetes or if you are overweight.  Pap Test - Have a pap test every 1 to 5 years  if you have been sexually active.  If you are older than 65 and recent pap tests have been normal you may not need additional pap tests.  In addition, if you have had a hysterectomy  for benign disease additional pap tests are not necessary.  Mammogram-Yearly mammograms are essential for early detection of breast cancer  Screening for Colon Cancer- Colonoscopy starting at age 71. Screening may begin sooner depending on your family  history and other health conditions.  Follow up colonoscopy as directed by your Gastroenterologist.  Screening for Osteoporosis- Screening begins at age 11 with bone density scanning, sooner if you are at higher risk for developing Osteoporosis.  Get these medicines  Calcium with Vitamin D- Your body requires 1200-1500 mg of Calcium a day and [phone] IU of Vitamin D a day.  You can only absorb 500 mg of Calcium at a time therefore Calcium must be taken in 2 or 3 separate doses throughout the day.  Hormones- Hormone therapy has been associated with increased risk for certain cancers and heart disease.  Talk to your healthcare provider about if you need relief from menopausal symptoms.  Aspirin- Ask your healthcare provider about taking Aspirin to prevent Heart Disease and Stroke.  Get these Immuniztions  Flu shot- Every fall  Pneumonia shot- Once after the age of 59; if you are younger ask your healthcare provider if you need a pneumonia shot.  Tetanus- Every ten years.  Zostavax- Once after the age of 63 to prevent shingles.  Take these steps  Don't smoke- Your healthcare provider can help you quit. For tips on how to quit, ask your healthcare provider or go to www.smokefree.gov or call 1-800 QUIT-NOW.  Be physically active- Exercise 5 days a week for a minimum of 30 minutes.  If you are not already physically active, start slow and gradually work up to 30 minutes of moderate physical activity.  Try walking, dancing, bike riding, swimming, etc.  Eat a healthy diet- Eat a variety of healthy foods such as fruits, vegetables, whole grains, low fat milk, low fat cheeses, yogurt, lean meats, chicken, fish, eggs, dried beans, tofu, etc.  For more information go to www.thenutritionsource.org  Dental visit- Brush and floss teeth twice daily; visit your dentist twice a year.  Eye exam- Visit your Optometrist or Ophthalmologist yearly.  Drink alcohol in moderation- Limit alcohol intake to  one drink or less a day.  Never drink and drive.  Depression- Your emotional health is as important as your physical health.  If you're feeling down or losing interest in things you normally enjoy, please talk to your healthcare provider.  Seat Belts- can save your life; always wear one  Smoke/Carbon Monoxide detectors- These detectors need to be installed on the appropriate level of your home.  Replace batteries at least once a year.  Violence- If anyone is threatening or hurting you, please tell your healthcare provider.  Living Will/ Health care power of attorney- Discuss with your healthcare provider and family.

## 2015-02-26 LAB — HEPATITIS C ANTIBODY: HCV Ab: NEGATIVE

## 2015-02-26 LAB — VITAMIN D 25 HYDROXY (VIT D DEFICIENCY, FRACTURES): Vit D, 25-Hydroxy: 39 ng/mL (ref 30–100)

## 2015-02-26 LAB — HIV ANTIBODY (ROUTINE TESTING W REFLEX): HIV 1&2 Ab, 4th Generation: NONREACTIVE

## 2015-03-01 ENCOUNTER — Telehealth: Payer: Self-pay

## 2015-03-01 ENCOUNTER — Encounter: Payer: Self-pay | Admitting: Physician Assistant

## 2015-03-01 ENCOUNTER — Encounter: Payer: Self-pay | Admitting: Emergency Medicine

## 2015-03-01 DIAGNOSIS — R0981 Nasal congestion: Secondary | ICD-10-CM | POA: Insufficient documentation

## 2015-03-01 DIAGNOSIS — G5700 Lesion of sciatic nerve, unspecified lower limb: Secondary | ICD-10-CM | POA: Insufficient documentation

## 2015-03-01 DIAGNOSIS — H43391 Other vitreous opacities, right eye: Secondary | ICD-10-CM | POA: Insufficient documentation

## 2015-03-01 NOTE — Telephone Encounter (Signed)
Placed today

## 2015-03-01 NOTE — Telephone Encounter (Signed)
Patient called asking about a referral to PT and ENT. Referrals not in EPIC. Please advise.    Fax number for PT is [phone]

## 2015-03-08 ENCOUNTER — Encounter: Payer: Self-pay | Admitting: Physical Therapy

## 2015-03-08 ENCOUNTER — Ambulatory Visit (INDEPENDENT_AMBULATORY_CARE_PROVIDER_SITE_OTHER): Payer: Self-pay | Admitting: Physical Therapy

## 2015-03-08 DIAGNOSIS — M791 Myalgia: Secondary | ICD-10-CM

## 2015-03-08 DIAGNOSIS — M7918 Myalgia, other site: Secondary | ICD-10-CM

## 2015-03-08 DIAGNOSIS — R531 Weakness: Secondary | ICD-10-CM

## 2015-03-08 NOTE — Therapy (Signed)
[place], [place], [address] Phone: [phone]   Fax:  [phone]  Physical Therapy Evaluation  Patient Details  Name: Kellie [name] MRN: [id number] Date of Birth: 1956/01/23 Referring Provider:  Donella Stade, PA-C  Encounter Date: 03/08/2015      PT End of Session - 03/08/15 1405    Visit Number 1   Number of Visits 7   Date for PT Re-Evaluation 04/05/15   PT Start Time 0849   PT Stop Time 0948   PT Time Calculation (min) 59 min   Activity Tolerance Patient tolerated treatment well      Past Medical History  Diagnosis Date  . Hyperlipidemia   . Allergic rhinitis 11/08/2010  . Osteoporosis 11/21/2011  . Asthma   . Bronchitis   . Anxiety   . Mitral valve prolapse   . Herpes simplex of female genitalia   . Abnormal Pap smear of cervix     Past Surgical History  Procedure Laterality Date  . Leep  11/2004  . Closed manipulation shoulder  7&01/2005    x2, 30 days apart  . Esophagogastroduodenoscopy  09/2012    Normal (Dr. Ardis Hughs)  . Colonoscopy  09/2012    Medium sized external hemorrhoids, few small divertic in left colon, otherwise normal colon (repeat 10 yrs)  . Closed manipulation shoulder  2006    Left    There were no vitals filed for this visit.  Visit Diagnosis:  Weakness generalized - Plan: PT plan of care cert/re-cert  Pain in right buttock - Plan: PT plan of care cert/re-cert      Subjective Assessment - 03/08/15 0850    Subjective Pt reports Rt hip pain starts in the back and moves around to the front of the right hip. Began to get really bad over the last 3 wks, now has constant pain.  She is not able to think of an incident that set the pain off.    Pertinent History has had back pain in the past however it hasn't been like this time.    How long can you stand comfortably? < 10'   How long can you walk comfortably? tolerating short walks in the store.     Diagnostic tests x-rays degeneration and arthritis in hip and back.    Patient Stated Goals walk, lift, jumping jacks/exercise   Currently in Pain? Yes   Pain Score 8    Pain Location Hip   Pain Orientation Right   Pain Descriptors / Indicators Sharp   Pain Type Acute pain   Aggravating Factors  bending forward, standing, walking   Pain Relieving Factors ibuprofen helps some             [facility] PT Assessment - 03/08/15 0001    Assessment   Medical Diagnosis Rt SIJ dysfunction   Onset Date/Surgical Date 02/15/15   Next MD Visit not scheduled   Prior Therapy not for low back   Precautions   Precautions --  osteopenia   Balance Screen   Has the patient fallen in the past 6 months No   Has the patient had a decrease in activity level because of a fear of falling?  No   Is the patient reluctant to leave their home because of a fear of falling?  No   Prior Function   Level of Independence Independent   Vocation Unemployed   Leisure exercise, stand at track and watch the races   Observation/Other  Assessments   Focus on Therapeutic Outcomes (FOTO)  59% limited   Posture/Postural Control   Posture/Postural Control Postural limitations   Postural Limitations Increased lumbar lordosis  winging of bilat scap Rt > Lt   Posture Comments thin frames   ROM / Strength   AROM / PROM / Strength AROM;Strength   AROM   Overall AROM Comments WNL for lower body   AROM Assessment Site --   Right/Left Hip --   Strength   Overall Strength Comments TA - fair, multifidis good (-)    Strength Assessment Site Hip   Right/Left Hip Left;Right   Right Hip Flexion 5/5   Right Hip Extension 4-/5   Right Hip ABduction 4+/5   Left Hip Flexion 5/5   Left Hip Extension 4-/5   Left Hip ABduction 4+/5   Flexibility   Soft Tissue Assessment /Muscle Length --  Lt hamstring slightly tighter than Rt    Palpation   Spinal mobility grade II mobs CPA/bilat UPA WNL lumbar and sacrum   Palpation comment  very tight and tender in Rt buttocks and lumbar paraspinals, slight tightness on Lt side.    Special Tests    Special Tests --  (-) lumbar and SIJ special tests                   [facility] Adult PT Treatment/Exercise - 03/08/15 0001    Exercises   Exercises Lumbar   Lumbar Exercises: Stretches   ITB Stretch 2 reps;30 seconds  bilat cross body with strap   Lumbar Exercises: Supine   Other Supine Lumbar Exercises foam roller on Rt gluts/buttocks and low back. attempted ball STW pt didn't feel it.    Modalities   Modalities Electrical Stimulation;Moist Heat   Moist Heat Therapy   Number Minutes Moist Heat 15 Minutes   Moist Heat Location Lumbar Spine  and buttocks   Electrical Stimulation   Electrical Stimulation Location Rt buttocks and low back   Electrical Stimulation Action IFC   Electrical Stimulation Parameters to tolerance   Electrical Stimulation Goals Pain;Tone                PT Education - 03/08/15 1405    Education provided Yes   Education Details HEP   Person(s) Educated Patient   Methods Explanation;Demonstration;Handout   Comprehension Returned demonstration             PT Long Term Goals - 03/08/15 1410    PT LONG TERM GOAL #1   Title I with HEP ( 04/05/15)   Time 4   Period Weeks   Status New   PT LONG TERM GOAL #2   Title increase bilat hip strength =/> 5-/5 ( 04/05/15)    Time 4   Period Weeks   Status New   PT LONG TERM GOAL #3   Title report pain decrease in Rt buttocks/hip =/> 75% to allow her to stand per her previous level ( 04/05/15)    Time 4   Period Weeks   Status New   PT LONG TERM GOAL #4   Title improve FOTO =/< 41% limited ( 04/05/15)    Time 4   Period Weeks   Status New               Plan - 03/08/15 1406    Clinical Impression Statement 59yo female with insidious onset of Rt low back/buttock and hip pain.  She has been trying to manage it with ibuprofen and resting from  her exercise.  The pain has  gotten worse and is limiting her ability to perform IADLs.  X-rays show degenerative changes and arthirtis.  Patient is also osteopenic.  She presents with hypersensitivy in her Rt buttocks and weakness    Pt will benefit from skilled therapeutic intervention in order to improve on the following deficits Decreased strength;Pain;Decreased activity tolerance;Increased muscle spasms   Rehab Potential Good   PT Frequency 2x / week  pt may only be able to attend 1x/wk due to travel and cost    PT Duration 4 weeks   PT Treatment/Interventions Manual techniques;Therapeutic exercise;Moist Heat;Electrical Stimulation;Cryotherapy;Dry needling;Passive range of motion;Patient/family education;Ultrasound;Traction   PT Next Visit Plan STW/TPR to Rt buttocks, progress core ex.    Consulted and Agree with Plan of Care Patient         Problem List Patient Active Problem List   Diagnosis Date Noted  . Piriformis syndrome 03/01/2015  . Chronic nasal congestion 03/01/2015  . Vitreous floaters of right eye 03/01/2015  . Sacroiliac joint dysfunction of right side 02/25/2015  . Near syncope 02/09/2015  . Sinusitis 02/09/2015  . Rhinitis, allergic 10/14/2014  . Family history of renal cancer 10/11/2014  . DDD (degenerative disc disease), lumbar 10/11/2014  . Bipolar disorder with depression 06/02/2014  . Postmenopausal atrophic vaginitis 03/17/2014  . Prediabetes 12/14/2013  . Adhesive capsulitis of right shoulder 11/24/2013  . Chronic periodontal disease 06/30/2013  . Genital herpes 11/05/2012  . Hemorrhoids 11/05/2012  . Borderline intellectual functioning 02/15/2012  . Osteoporosis 11/21/2011  . Allergic rhinitis 11/08/2010  . Insomnia 11/08/2010  . GERD (gastroesophageal reflux disease) 09/27/2010    Jeral Pinch PT 03/08/2015, 2:13 PM  [facility] [place], [place], [address] Phone: [phone]   Fax:   [phone]

## 2015-03-08 NOTE — Patient Instructions (Signed)
Outer Hip Stretch: Reclined IT Band Stretch (Strap)   Strap around opposite foot, pull across only as far as possible with shoulders on mat. Hold for _30-45___ secs. Repeat __2__ times each leg.  Copyright  VHI. All rights reserved.    Use a soft ball under right buttocks to tender areas for deep tissue release. Can also use a foam roller if you can get one.   Use heat 1-2 times a day for 15-20 minutes

## 2015-03-10 ENCOUNTER — Ambulatory Visit (INDEPENDENT_AMBULATORY_CARE_PROVIDER_SITE_OTHER): Payer: Self-pay | Admitting: Physical Therapy

## 2015-03-10 DIAGNOSIS — M7918 Myalgia, other site: Secondary | ICD-10-CM

## 2015-03-10 DIAGNOSIS — M791 Myalgia: Secondary | ICD-10-CM

## 2015-03-10 DIAGNOSIS — R531 Weakness: Secondary | ICD-10-CM

## 2015-03-10 NOTE — Therapy (Signed)
[place], [place], [address] Phone: [phone]   Fax:  [phone]  Physical Therapy Treatment  Patient Details  Name: Kellie [name] MRN: [id number] Date of Birth: 09-22-1955 Referring Provider:  Donella Stade, PA-C  Encounter Date: 03/10/2015      PT End of Session - 03/10/15 1145    Visit Number 2   Number of Visits 7   Date for PT Re-Evaluation 04/05/15   PT Start Time 3716   PT Stop Time 1257   PT Time Calculation (min) 72 min      Past Medical History  Diagnosis Date  . Hyperlipidemia   . Allergic rhinitis 11/08/2010  . Osteoporosis 11/21/2011  . Asthma   . Bronchitis   . Anxiety   . Mitral valve prolapse   . Herpes simplex of female genitalia   . Abnormal Pap smear of cervix     Past Surgical History  Procedure Laterality Date  . Leep  11/2004  . Closed manipulation shoulder  7&01/2005    x2, 30 days apart  . Esophagogastroduodenoscopy  09/2012    Normal (Dr. Ardis Hughs)  . Colonoscopy  09/2012    Medium sized external hemorrhoids, few small divertic in left colon, otherwise normal colon (repeat 10 yrs)  . Closed manipulation shoulder  2006    Left    There were no vitals filed for this visit.  Visit Diagnosis:  Weakness generalized  Pain in right buttock      Subjective Assessment - 03/10/15 1145    Subjective Pt reports she hasn't been in any pain since last visit as she hasn't done anything. She is using a foam roller on her buttock and stretching.      Currently in Pain? No/denies                         [facility] Adult PT Treatment/Exercise - 03/10/15 0001    Lumbar Exercises: Stretches   ITB Stretch 2 reps;30 seconds  with strap cross body   Lumbar Exercises: Aerobic   Stationary Bike Nustep L5 x 5'   Lumbar Exercises: Supine   Ab Set 10 reps   AB Set Limitations VC for form, added in knee ext to ceiling x 10    Clam 15 reps  with abd bracing    Other Supine Lumbar Exercises 10 table top positions   Lumbar Exercises: Prone   Other Prone Lumbar Exercises pelvic press for lower body.    Modalities   Modalities Electrical Stimulation;Moist Heat                     PT Long Term Goals - 03/08/15 1410    PT LONG TERM GOAL #1   Title I with HEP ( 04/05/15)   Time 4   Period Weeks   Status New   PT LONG TERM GOAL #2   Title increase bilat hip strength =/> 5-/5 ( 04/05/15)    Time 4   Period Weeks   Status New   PT LONG TERM GOAL #3   Title report pain decrease in Rt buttocks/hip =/> 75% to allow her to stand per her previous level ( 04/05/15)    Time 4   Period Weeks   Status New   PT LONG TERM GOAL #4   Title improve FOTO =/< 41% limited ( 04/05/15)    Time 4   Period Weeks   Status New  Plan - 03/10/15 1217    Clinical Impression Statement Only the second visit, she hasn't been very active so she hasn't been having pain.  She is going to join a gym and go to the track over the weekend.  Will so if her standing tolerance has improved as she is using the roller consistently.    Pt will benefit from skilled therapeutic intervention in order to improve on the following deficits Decreased strength;Pain;Decreased activity tolerance;Increased muscle spasms   Rehab Potential Good   PT Frequency 2x / week  possibly once a week depending on availability   PT Duration 4 weeks   PT Treatment/Interventions Manual techniques;Therapeutic exercise;Moist Heat;Electrical Stimulation;Cryotherapy;Dry needling;Passive range of motion;Patient/family education;Ultrasound;Traction   PT Next Visit Plan add/progress HEP core    Consulted and Agree with Plan of Care Patient        Problem List Patient Active Problem List   Diagnosis Date Noted  . Piriformis syndrome 03/01/2015  . Chronic nasal congestion 03/01/2015  . Vitreous floaters of right eye 03/01/2015  . Sacroiliac joint dysfunction of right  side 02/25/2015  . Near syncope 02/09/2015  . Sinusitis 02/09/2015  . Rhinitis, allergic 10/14/2014  . Family history of renal cancer 10/11/2014  . DDD (degenerative disc disease), lumbar 10/11/2014  . Bipolar disorder with depression 06/02/2014  . Postmenopausal atrophic vaginitis 03/17/2014  . Prediabetes 12/14/2013  . Adhesive capsulitis of right shoulder 11/24/2013  . Chronic periodontal disease 06/30/2013  . Genital herpes 11/05/2012  . Hemorrhoids 11/05/2012  . Borderline intellectual functioning 02/15/2012  . Osteoporosis 11/21/2011  . Allergic rhinitis 11/08/2010  . Insomnia 11/08/2010  . GERD (gastroesophageal reflux disease) 09/27/2010    Jeral Pinch PT 03/10/2015, 12:45 PM  [facility] [place], [place], [address] Phone: [phone]   Fax:  [phone]

## 2015-03-14 ENCOUNTER — Ambulatory Visit (INDEPENDENT_AMBULATORY_CARE_PROVIDER_SITE_OTHER): Payer: Self-pay | Admitting: Physical Therapy

## 2015-03-14 ENCOUNTER — Encounter: Payer: Self-pay | Admitting: Physical Therapy

## 2015-03-14 DIAGNOSIS — M791 Myalgia: Secondary | ICD-10-CM

## 2015-03-14 DIAGNOSIS — R531 Weakness: Secondary | ICD-10-CM

## 2015-03-14 DIAGNOSIS — M7918 Myalgia, other site: Secondary | ICD-10-CM

## 2015-03-14 NOTE — Therapy (Signed)
[place], [place], [address] Phone: [phone]   Fax:  [phone]  Physical Therapy Treatment  Patient Details  Name: Kellie [name] MRN: [id number] Date of Birth: Dec 26, 1955 Referring Jeovany Huitron:  Donella Stade, PA-C  Encounter Date: 03/14/2015      PT End of Session - 03/14/15 1014    Visit Number 3   Number of Visits 7   Date for PT Re-Evaluation 04/05/15   PT Start Time 6812   PT Stop Time 1107   PT Time Calculation (min) 53 min   Activity Tolerance Patient tolerated treatment well      Past Medical History  Diagnosis Date  . Hyperlipidemia   . Allergic rhinitis 11/08/2010  . Osteoporosis 11/21/2011  . Asthma   . Bronchitis   . Anxiety   . Mitral valve prolapse   . Herpes simplex of female genitalia   . Abnormal Pap smear of cervix     Past Surgical History  Procedure Laterality Date  . Leep  11/2004  . Closed manipulation shoulder  7&01/2005    x2, 30 days apart  . Esophagogastroduodenoscopy  09/2012    Normal (Dr. Ardis Hughs)  . Colonoscopy  09/2012    Medium sized external hemorrhoids, few small divertic in left colon, otherwise normal colon (repeat 10 yrs)  . Closed manipulation shoulder  2006    Left    There were no vitals filed for this visit.  Visit Diagnosis:  Weakness generalized  Pain in right buttock      Subjective Assessment - 03/14/15 1017    Subjective Pt reports she went to the races over the weekend, She did well for about 10 hrs and started to get sore the last 2 hrs into. Not sure if she is doing her stretch correctly.   Currently in Pain? Yes   Pain Score 4    Pain Location Hip   Pain Orientation Right   Pain Descriptors / Indicators Dull   Pain Type Acute pain   Aggravating Factors  prolonged standing/walking, bending forward   Pain Relieving Factors ibuprofen                         [facility] Adult PT Treatment/Exercise - 03/14/15  0001    Lumbar Exercises: Stretches   Passive Hamstring Stretch 30 seconds  with strap   Double Knee to Chest Stretch 30 seconds   ITB Stretch 1 rep;30 seconds  with strap   Lumbar Exercises: Aerobic   Stationary Bike Nustep L5 x 5'   Lumbar Exercises: Supine   Bridge 10 reps  then with leg press, and leg lowering/raising.   Lumbar Exercises: Prone   Opposite Arm/Leg Raise Left arm/Right leg;Right arm/Left leg;20 reps   Lumbar Exercises: Quadruped   [place] Horse 20 reps   Modalities   Modalities Electrical Stimulation;Moist Heat   Moist Heat Therapy   Number Minutes Moist Heat 15 Minutes   Moist Heat Location Lumbar Spine   Electrical Stimulation   Electrical Stimulation Location Rt buttock/low back   Electrical Stimulation Action IFC   Electrical Stimulation Parameters to tolerance   Electrical Stimulation Goals Pain;Tone                PT Education - 03/14/15 1038    Education provided Yes   Education Details HEP   Person(s) Educated Patient   Methods Explanation;Handout   Comprehension Returned demonstration  PT Long Term Goals - 03/14/15 1020    PT LONG TERM GOAL #1   Title I with HEP ( 04/05/15)   Status On-going   PT LONG TERM GOAL #2   Title increase bilat hip strength =/> 5-/5 ( 04/05/15)    Status On-going   PT LONG TERM GOAL #3   Title report pain decrease in Rt buttocks/hip =/> 75% to allow her to stand per her previous level ( 04/05/15)    Status On-going  about 50% reduction   PT LONG TERM GOAL #4   Title improve FOTO =/< 41% limited ( 04/05/15)    Status On-going               Plan - 03/14/15 1055    Clinical Impression Statement This is Kellie [name]'s third visit, She is having decreased pain overall.  Kellie [name] is very weak through her core and requires VC to maintain form during exercise.  No goals met however is progressing to them.    Pt will benefit from skilled therapeutic intervention in order to improve on the  following deficits Decreased strength;Pain;Decreased activity tolerance;Increased muscle spasms   Rehab Potential Good   PT Frequency 2x / week   PT Duration 4 weeks   PT Treatment/Interventions Manual techniques;Therapeutic exercise;Moist Heat;Electrical Stimulation;Cryotherapy;Dry needling;Passive range of motion;Patient/family education;Ultrasound;Traction   PT Next Visit Plan core stab ex   Consulted and Agree with Plan of Care Patient     Letter written for patient recommending she join a community gym to maintain her strength once PT is finished.    Problem List Patient Active Problem List   Diagnosis Date Noted  . Piriformis syndrome 03/01/2015  . Chronic nasal congestion 03/01/2015  . Vitreous floaters of right eye 03/01/2015  . Sacroiliac joint dysfunction of right side 02/25/2015  . Near syncope 02/09/2015  . Sinusitis 02/09/2015  . Rhinitis, allergic 10/14/2014  . Family history of renal cancer 10/11/2014  . DDD (degenerative disc disease), lumbar 10/11/2014  . Bipolar disorder with depression 06/02/2014  . Postmenopausal atrophic vaginitis 03/17/2014  . Prediabetes 12/14/2013  . Adhesive capsulitis of right shoulder 11/24/2013  . Chronic periodontal disease 06/30/2013  . Genital herpes 11/05/2012  . Hemorrhoids 11/05/2012  . Borderline intellectual functioning 02/15/2012  . Osteoporosis 11/21/2011  . Allergic rhinitis 11/08/2010  . Insomnia 11/08/2010  . GERD (gastroesophageal reflux disease) 09/27/2010    Jeral Pinch PT 03/14/2015, 10:57 AM  [place] [facility] [place], [place], [address] Phone: [phone]   Fax:  [phone]

## 2015-03-14 NOTE — Patient Instructions (Signed)
Bridge / Leg Raise (Intermediate / Advanced)   [place] [phone]   Hips pressed up in bridge, place hands under hips, thumbs in. Inhale, extending leg up. Exhale, lowering leg to mat, hips up. Repeat _1-2x10___ times. Repeat with other leg. Do _1___ sessions per day.  Lower Abdominal (Dead Bug): Strength   Can do on the noodle if you want more challenge. Bring legs up into table top position, alternate legs out. Keep back flat to maintain abdominal tension. Hold each position _1__ seconds. Repeat _2x10__ times. Do ___ sessions per day. Bring legs back close to body when tired.  Opposite Arm / Leg Lift (Prone)   Abdomen and head supported, left knee locked, raise leg and opposite arm _2___ inches from floor. Repeat __10__ times per set. Do __2__ sets per session. Do _1___ sessions per day.  Copyright  VHI. All rights reserved.

## 2015-03-17 ENCOUNTER — Ambulatory Visit (INDEPENDENT_AMBULATORY_CARE_PROVIDER_SITE_OTHER): Payer: Self-pay | Admitting: Physical Therapy

## 2015-03-17 ENCOUNTER — Encounter: Payer: Self-pay | Admitting: Physical Therapy

## 2015-03-17 DIAGNOSIS — R531 Weakness: Secondary | ICD-10-CM

## 2015-03-17 DIAGNOSIS — M791 Myalgia: Secondary | ICD-10-CM

## 2015-03-17 DIAGNOSIS — M7918 Myalgia, other site: Secondary | ICD-10-CM

## 2015-03-17 NOTE — Therapy (Signed)
[place], [place], [address] Phone: [phone]   Fax:  [phone]  Physical Therapy Treatment  Patient Details  Name: Kellie [name] MRN: [id number] Date of Birth: 1955/11/14 Referring Provider:  Donella Stade, PA-C  Encounter Date: 03/17/2015      PT End of Session - 03/17/15 0902    Visit Number 4   Number of Visits 7   Date for PT Re-Evaluation 04/05/15   PT Start Time 8841   PT Stop Time 0923   PT Time Calculation (min) 39 min      Past Medical History  Diagnosis Date  . Hyperlipidemia   . Allergic rhinitis 11/08/2010  . Osteoporosis 11/21/2011  . Asthma   . Bronchitis   . Anxiety   . Mitral valve prolapse   . Herpes simplex of female genitalia   . Abnormal Pap smear of cervix     Past Surgical History  Procedure Laterality Date  . Leep  11/2004  . Closed manipulation shoulder  7&01/2005    x2, 30 days apart  . Esophagogastroduodenoscopy  09/2012    Normal (Dr. Ardis Hughs)  . Colonoscopy  09/2012    Medium sized external hemorrhoids, few small divertic in left colon, otherwise normal colon (repeat 10 yrs)  . Closed manipulation shoulder  2006    Left    There were no vitals filed for this visit.  Visit Diagnosis:  Weakness generalized  Pain in right buttock          [facility] PT Assessment - 03/17/15 0001    Assessment   Medical Diagnosis Rt SIJ dysfunction   Onset Date/Surgical Date 02/15/15                     [facility] Adult PT Treatment/Exercise - 03/17/15 0001    Lumbar Exercises: Stretches   Passive Hamstring Stretch 30 seconds;2 reps   Double Knee to Chest Stretch 30 seconds   ITB Stretch 3 reps;20 seconds  bilat with strap   Lumbar Exercises: Aerobic   Stationary Bike Nustep L5 x 7'   Lumbar Exercises: Supine   Ab Set 15 reps  3 part core   Bridge 15 reps  with feet on ball, with pelvic rotations   Other Supine Lumbar Exercises ball passes hands  to/from knees x10   Lumbar Exercises: Sidelying   Clam 10 reps  3reps   Lumbar Exercises: Prone   Opposite Arm/Leg Raise Left arm/Right leg;Right arm/Left leg;20 reps                     PT Long Term Goals - 03/14/15 1020    PT LONG TERM GOAL #1   Title I with HEP ( 04/05/15)   Status On-going   PT LONG TERM GOAL #2   Title increase bilat hip strength =/> 5-/5 ( 04/05/15)    Status On-going   PT LONG TERM GOAL #3   Title report pain decrease in Rt buttocks/hip =/> 75% to allow her to stand per her previous level ( 04/05/15)    Status On-going  about 50% reduction   PT LONG TERM GOAL #4   Title improve FOTO =/< 41% limited ( 04/05/15)    Status On-going               Plan - 03/17/15 6606    Clinical Impression Statement Pt is very weak and fatigues quickly and has residual soreness after treatment.  Progressing  with strengthening.    Pt will benefit from skilled therapeutic intervention in order to improve on the following deficits Decreased strength;Pain;Decreased activity tolerance;Increased muscle spasms   Rehab Potential Good   PT Frequency 2x / week   PT Duration 4 weeks   PT Treatment/Interventions Manual techniques;Therapeutic exercise;Moist Heat;Electrical Stimulation;Cryotherapy;Dry needling;Passive range of motion;Patient/family education;Ultrasound;Traction   PT Next Visit Plan core stab ex   Consulted and Agree with Plan of Care Patient        Problem List Patient Active Problem List   Diagnosis Date Noted  . Piriformis syndrome 03/01/2015  . Chronic nasal congestion 03/01/2015  . Vitreous floaters of right eye 03/01/2015  . Sacroiliac joint dysfunction of right side 02/25/2015  . Near syncope 02/09/2015  . Sinusitis 02/09/2015  . Rhinitis, allergic 10/14/2014  . Family history of renal cancer 10/11/2014  . DDD (degenerative disc disease), lumbar 10/11/2014  . Bipolar disorder with depression 06/02/2014  . Postmenopausal atrophic  vaginitis 03/17/2014  . Prediabetes 12/14/2013  . Adhesive capsulitis of right shoulder 11/24/2013  . Chronic periodontal disease 06/30/2013  . Genital herpes 11/05/2012  . Hemorrhoids 11/05/2012  . Borderline intellectual functioning 02/15/2012  . Osteoporosis 11/21/2011  . Allergic rhinitis 11/08/2010  . Insomnia 11/08/2010  . GERD (gastroesophageal reflux disease) 09/27/2010    Jeral Pinch PT 03/17/2015, 9:20 AM  [facility] [place], [place], [address] Phone: [phone]   Fax:  [phone]

## 2015-03-22 ENCOUNTER — Ambulatory Visit (INDEPENDENT_AMBULATORY_CARE_PROVIDER_SITE_OTHER): Payer: Self-pay | Admitting: Physical Therapy

## 2015-03-22 DIAGNOSIS — R531 Weakness: Secondary | ICD-10-CM

## 2015-03-22 DIAGNOSIS — M791 Myalgia: Secondary | ICD-10-CM

## 2015-03-22 DIAGNOSIS — M7918 Myalgia, other site: Secondary | ICD-10-CM

## 2015-03-22 NOTE — Therapy (Signed)
[place], [place], [address] Phone: [phone]   Fax:  [phone]  Physical Therapy Treatment  Patient Details  Name: Kellie [name] MRN: [id number] Date of Birth: Mar 29, 1956 Referring Rajeev Escue:  Donella Stade, PA-C  Encounter Date: 03/22/2015      PT End of Session - 03/22/15 0929    Visit Number 5   Number of Visits 7   Date for PT Re-Evaluation 04/05/15   PT Start Time 0929   PT Stop Time 1013   PT Time Calculation (min) 44 min      Past Medical History  Diagnosis Date  . Hyperlipidemia   . Allergic rhinitis 11/08/2010  . Osteoporosis 11/21/2011  . Asthma   . Bronchitis   . Anxiety   . Mitral valve prolapse   . Herpes simplex of female genitalia   . Abnormal Pap smear of cervix     Past Surgical History  Procedure Laterality Date  . Leep  11/2004  . Closed manipulation shoulder  7&01/2005    x2, 30 days apart  . Esophagogastroduodenoscopy  09/2012    Normal (Dr. Ardis Hughs)  . Colonoscopy  09/2012    Medium sized external hemorrhoids, few small divertic in left colon, otherwise normal colon (repeat 10 yrs)  . Closed manipulation shoulder  2006    Left    There were no vitals filed for this visit.  Visit Diagnosis:  Weakness generalized  Pain in right buttock      Subjective Assessment - 03/22/15 0932    Subjective Pt has been going to the gym and she is very sore all over form starting working out.    Currently in Pain? Yes   Pain Score 4    Pain Location --  hurting all over from starting a gym program.   Aggravating Factors  not sure right now as she is sore all over   Pain Relieving Factors medication                         [facility] Adult PT Treatment/Exercise - 03/22/15 0001    Self-Care   Self-Care --  discussed gym routines, easing off when sore    Lumbar Exercises: Stretches   Active Hamstring Stretch 2 reps;30 seconds  bilat LE's in standing    ITB Stretch 1 rep;30 seconds  bilat in standing   Lumbar Exercises: Aerobic   Stationary Bike Nustep L5 x 7'   Lumbar Exercises: Supine   Bridge 10 reps  single leg each side   Lumbar Exercises: Prone   Other Prone Lumbar Exercises 10 press ups   Lumbar Exercises: Quadruped   Straight Leg Raise 15 reps  bilat, donkey kicks   Straight Leg Raises Limitations then hip abduction                PT Education - 03/22/15 1017    Education provided Yes   Education Details gym routines balance   Person(s) Educated Patient   Methods Explanation   Comprehension Verbalized understanding             PT Long Term Goals - 03/22/15 1016    PT LONG TERM GOAL #1   Title I with HEP ( 04/05/15)   Status Achieved   PT LONG TERM GOAL #2   Title increase bilat hip strength =/> 5-/5 ( 04/05/15)    Status On-going   PT LONG TERM GOAL #3   Title  report pain decrease in Rt buttocks/hip =/> 75% to allow her to stand per her previous level ( 04/05/15)    Status On-going   PT LONG TERM GOAL #4   Title improve FOTO =/< 41% limited ( 04/05/15)    Status On-going               Problem List Patient Active Problem List   Diagnosis Date Noted  . Piriformis syndrome 03/01/2015  . Chronic nasal congestion 03/01/2015  . Vitreous floaters of right eye 03/01/2015  . Sacroiliac joint dysfunction of right side 02/25/2015  . Near syncope 02/09/2015  . Sinusitis 02/09/2015  . Rhinitis, allergic 10/14/2014  . Family history of renal cancer 10/11/2014  . DDD (degenerative disc disease), lumbar 10/11/2014  . Bipolar disorder with depression 06/02/2014  . Postmenopausal atrophic vaginitis 03/17/2014  . Prediabetes 12/14/2013  . Adhesive capsulitis of right shoulder 11/24/2013  . Chronic periodontal disease 06/30/2013  . Genital herpes 11/05/2012  . Hemorrhoids 11/05/2012  . Borderline intellectual functioning 02/15/2012  . Osteoporosis 11/21/2011  . Allergic rhinitis 11/08/2010   . Insomnia 11/08/2010  . GERD (gastroesophageal reflux disease) 09/27/2010    Jeral Pinch PT 03/22/2015, 10:18 AM  [place], [place], [address] Phone: [phone]   Fax:  [phone]

## 2015-03-28 ENCOUNTER — Encounter (HOSPITAL_COMMUNITY): Payer: Self-pay | Admitting: Psychiatry

## 2015-03-28 ENCOUNTER — Ambulatory Visit (INDEPENDENT_AMBULATORY_CARE_PROVIDER_SITE_OTHER): Payer: Self-pay | Admitting: Physical Therapy

## 2015-03-28 ENCOUNTER — Ambulatory Visit (INDEPENDENT_AMBULATORY_CARE_PROVIDER_SITE_OTHER): Payer: Self-pay | Admitting: Psychiatry

## 2015-03-28 DIAGNOSIS — R531 Weakness: Secondary | ICD-10-CM

## 2015-03-28 DIAGNOSIS — M7918 Myalgia, other site: Secondary | ICD-10-CM

## 2015-03-28 DIAGNOSIS — F419 Anxiety disorder, unspecified: Secondary | ICD-10-CM

## 2015-03-28 DIAGNOSIS — M791 Myalgia: Secondary | ICD-10-CM

## 2015-03-28 DIAGNOSIS — G4700 Insomnia, unspecified: Secondary | ICD-10-CM

## 2015-03-28 DIAGNOSIS — F310 Bipolar disorder, current episode hypomanic: Secondary | ICD-10-CM

## 2015-03-28 MED ORDER — LAMOTRIGINE 100 MG PO TABS: 100.0000 mg | ORAL_TABLET | Freq: Two times a day (BID) | ORAL | Status: DC

## 2015-03-28 MED ORDER — HYDROXYZINE PAMOATE 25 MG PO CAPS: 25.0000 mg | ORAL_CAPSULE | Freq: Every day | ORAL | Status: DC

## 2015-03-28 MED ORDER — TRAZODONE HCL 50 MG PO TABS: 50.0000 mg | ORAL_TABLET | Freq: Every day | ORAL | Status: DC

## 2015-03-28 NOTE — Progress Notes (Signed)
Patient ID: Kellie [name], female   DOB: 1955-07-07, 59 y.o.   MRN: [id number]   [place] Follow up Outpatient visit  Kellie [name] [id number] 59 y.o.  03/28/2015 9:09 AM  Chief Complaint:  Racing toughts at night . Follow up.  History of Present Illness:   Patient  Initially presented with racing toughts and cant sleep at night.  She has had improvement in her spiritual thoughts and mind racing with the Lamictal.  Bipolar: lamictal helpful no rash reported  Vistaril helps her anxiety and sleep. She alternates trazadone with vistaril. Sleep: still at timew variable to poor.  Modifying factors; she spiritual she feels connected with God. She talks about her kids.  Severity of depression; 6 out of 10. 10 being no depression  Context; feeling lonely, worried about her age and finances Medical complexity: back pain and DDD makes her sleep and mood worse. She is going to physical therapy Denies drug, alcohol abuse or use.  Past Psychiatric History/Hospitalization(s) denies  Hospitalization for psychiatric illness: No History of Electroconvulsive Shock Therapy: No Prior Suicide Attempts: No  Medical History; Past Medical History  Diagnosis Date  . Hyperlipidemia   . Allergic rhinitis 11/08/2010  . Osteoporosis 11/21/2011  . Asthma   . Bronchitis   . Anxiety   . Mitral valve prolapse   . Herpes simplex of female genitalia   . Abnormal Pap smear of cervix     Allergies: Allergies  Allergen Reactions  . Hydrocodone Nausea And Vomiting  . Codeine Nausea And Vomiting  . Morphine And Related Nausea And Vomiting  . Percocet [Oxycodone-Acetaminophen] Nausea And Vomiting and Other (See Comments)    Patient states it makes blood pressure drop too much    Medications: Outpatient Encounter Prescriptions as of 03/28/2015  Medication Sig  . acyclovir (ZOVIRAX) 200 MG capsule Take 2 capsules (400 mg total) by mouth 2 (two) times daily.  . Beclomethasone  Dipropionate 80 MCG/ACT AERS Place 2 sprays into the nose daily.  [name] Kitchen doxycycline (VIBRA-TABS) 100 MG tablet Take 1 tablet (100 mg total) by mouth 2 (two) times daily.  . fluticasone (FLONASE) 50 MCG/ACT nasal spray One spray in each nostril twice a day, use left hand for right nostril, and right hand for left nostril.  . hydrOXYzine (VISTARIL) 25 MG capsule Take 1 capsule (25 mg total) by mouth daily.  [name] Kitchen ibuprofen (ADVIL,MOTRIN) 800 MG tablet Take 1 tablet (800 mg total) by mouth every 8 (eight) hours as needed for mild pain.  [name] Kitchen lamoTRIgine (LAMICTAL) 100 MG tablet Take 1 tablet (100 mg total) by mouth 2 (two) times daily.  [name] Kitchen loratadine (CLARITIN) 10 MG tablet Take 1 tablet (10 mg total) by mouth daily.  . traZODone (DESYREL) 50 MG tablet Take 1 tablet (50 mg total) by mouth at bedtime.  . [DISCONTINUED] hydrOXYzine (VISTARIL) 25 MG capsule Take 1 capsule (25 mg total) by mouth daily.  . [DISCONTINUED] lamoTRIgine (LAMICTAL) 100 MG tablet Take 1 tablet (100 mg total) by mouth 2 (two) times daily.  . [DISCONTINUED] traZODone (DESYREL) 50 MG tablet Take 1 tablet (50 mg total) by mouth at bedtime.   No facility-administered encounter medications on file as of 03/28/2015.     Family History; Family History  Problem Relation Age of Onset  . Kidney cancer Mother 62  . COPD Mother   . Cancer Mother     renal  . Kidney disease Mother   . Heart disease Father   . COPD Sister   .  Depression Sister   . Heart disease Brother   . Heart attack Brother   . Heart disease Paternal Uncle   . Colon cancer Neg Hx   . Rectal cancer Neg Hx   . Stomach cancer Neg Hx   . Colon polyps Neg Hx   . Diabetes      neice  . Thyroid disease      neice  . Depression Maternal Aunt   . Depression Maternal Grandfather        Labs:  Recent Results (from the past 2160 hour(s))  COMPLETE METABOLIC PANEL WITH GFR     Status: None   Collection Time: 01/25/15  8:15 AM  Result Value Ref Range   Sodium 144 135  - 146 mmol/L   Potassium 4.8 3.5 - 5.3 mmol/L   Chloride 104 98 - 110 mmol/L   CO2 27 20 - 31 mmol/L   Glucose, Bld 76 65 - 99 mg/dL   BUN 17 7 - 25 mg/dL   Creat 0.70 0.50 - 0.99 mg/dL   Total Bilirubin 0.5 0.2 - 1.2 mg/dL   Alkaline Phosphatase 44 33 - 130 U/L   AST 18 10 - 35 U/L   ALT 12 6 - 29 U/L   Total Protein 6.5 6.1 - 8.1 g/dL   Albumin 4.4 3.6 - 5.1 g/dL   Calcium 9.6 8.6 - 10.4 mg/dL   GFR, Est African American >89 >=60 mL/min   GFR, Est Non African American >89 >=60 mL/min    Comment:   The estimated GFR is a calculation valid for adults (>=3 years old) that uses the CKD-EPI algorithm to adjust for age and sex. It is   not to be used for children, pregnant women, hospitalized patients,    patients on dialysis, or with rapidly changing kidney function. According to the NKDEP, eGFR >89 is normal, 60-89 shows mild impairment, 30-59 shows moderate impairment, 15-29 shows severe impairment and <15 is ESRD.     Lipid Profile     Status: Abnormal   Collection Time: 01/25/15  8:15 AM  Result Value Ref Range   Cholesterol 208 (H) 125 - 200 mg/dL   Triglycerides 44 <150 mg/dL   HDL 92 >=46 mg/dL   Total CHOL/HDL Ratio 2.3 <=5.0 Ratio   VLDL 9 <30 mg/dL   LDL Cholesterol 107 <130 mg/dL    Comment:   Total Cholesterol/HDL Ratio:CHD Risk                        Coronary Heart Disease Risk Table                                        Men       Women          1/2 Average Risk              3.4        3.3              Average Risk              5.0        4.4           2X Average Risk              9.6        7.1  3X Average Risk             23.4       11.0 Use the calculated Patient Ratio above and the CHD Risk table  to determine the patient's CHD Risk. ATP III Classification (LDL):       < 100        mg/dL         Optimal      100 - 129     mg/dL         Near or Above Optimal      130 - 159     mg/dL         Borderline High      160 - 189     mg/dL          High       > 190        mg/dL         Very High     HgB A1c     Status: None   Collection Time: 01/25/15  8:15 AM  Result Value Ref Range   Hgb A1c MFr Bld 5.5 <5.7 %    Comment:                                                                        According to the ADA Clinical Practice Recommendations for 2011, when HbA1c is used as a screening test:     >=6.5%   Diagnostic of Diabetes Mellitus            (if abnormal result is confirmed)   5.7-6.4%   Increased risk of developing Diabetes Mellitus   References:Diagnosis and Classification of Diabetes Mellitus,Diabetes HENI,7782,42(PNTIR 1):S62-S69 and Standards of Medical Care in         Diabetes - 2011,Diabetes Care,2011,34 (Suppl 1):S11-S61.      Mean Plasma Glucose 111 <117 mg/dL    Comment:   Footnotes:  (1) ** Please note change in unit of measure and reference range(s). **     TSH     Status: None   Collection Time: 02/25/15  9:19 AM  Result Value Ref Range   TSH 1.436 0.350 - 4.500 uIU/mL  Hepatitis C antibody     Status: None   Collection Time: 02/25/15  9:19 AM  Result Value Ref Range   HCV Ab NEGATIVE NEGATIVE  HIV antibody (with reflex)     Status: None   Collection Time: 02/25/15  9:19 AM  Result Value Ref Range   HIV 1&2 Ab, 4th Generation NONREACTIVE NONREACTIVE    Comment:   HIV-1 antigen and HIV-1/HIV-2 antibodies were not detected.  There is no laboratory evidence of HIV infection.   HIV-1/2 Antibody Diff        Not indicated. HIV-1 RNA, Qual TMA          Not indicated.     PLEASE NOTE: This information has been disclosed to you from records whose confidentiality may be protected by state law. If your state requires such protection, then the state law prohibits you from making any further disclosure of the information without the specific written consent of the person to whom it pertains, or as otherwise permitted by law.  A general authorization for the release of medical or other information  is NOT sufficient for this purpose.   The performance of this assay has not been clinically validated in patients less than 29 years old.   For additional information please refer to http://education.questdiagnostics.com/faq/FAQ106.  (This link is being provided for informational/educational purposes only.)     Vitamin D (25 hydroxy)     Status: None   Collection Time: 02/25/15  9:19 AM  Result Value Ref Range   Vit D, 25-Hydroxy 39 30 - 100 ng/mL    Comment: Vitamin D Status           25-OH Vitamin D        Deficiency                <20 ng/mL        Insufficiency         20 - 29 ng/mL        Optimal             > or = 30 ng/mL   For 25-OH Vitamin D testing on patients on D2-supplementation and patients for whom quantitation of D2 and D3 fractions is required, the Quest[employer] 25-OH VIT D, (D2,D3), LC/MS/MS is recommended: order code [phone] (patients > 2 yrs).   B12     Status: None   Collection Time: 02/25/15  9:19 AM  Result Value Ref Range   Vitamin B-12 637 211 - 911 pg/mL       Musculoskeletal: Strength & Muscle Tone: within normal limits Gait & Station: normal Patient leans: N/A  Mental Status Examination;   Psychiatric Specialty Exam: Physical Exam  Constitutional: She appears well-developed and well-nourished.  Skin: She is not diaphoretic.    Review of Systems  Constitutional: Negative for fever.  Musculoskeletal: Positive for back pain.  Skin: Negative for rash.  Neurological: Negative for tremors and headaches.  Psychiatric/Behavioral: Negative for suicidal ideas. The patient has insomnia.     There were no vitals taken for this visit.There is no weight on file to calculate BMI.  General Appearance: Casual  Eye Contact::  Fair  Speech:  coherent  Volume:  Normal  Mood:  labile but not hopeless  Affect:  congruent  Thought Process:  Infrequent seeing spider or objects .  Orientation:  Full (Time, Place, and Person)  Thought Content:  Rumination   Suicidal Thoughts:  No  Homicidal Thoughts:  No  Memory:  Immediate;   Fair Recent;   Fair  Judgement:  Fair  Insight:  Shallow  Psychomotor Activity:  Increased  Concentration:  Fair  Recall:  Fair  Akathisia:  Negative  Handed:  Right  AIMS (if indicated):     Assets:  Desire for Improvement Physical Health Transportation  Sleep:        Assessment: Axis I: bipolar disorder II unspecified or depressed type. Anxiety disorder NOS. Insomnia  Axis II: deferred  Axis III:  Past Medical History  Diagnosis Date  . Hyperlipidemia   . Allergic rhinitis 11/08/2010  . Osteoporosis 11/21/2011  . Asthma   . Bronchitis   . Anxiety   . Mitral valve prolapse   . Herpes simplex of female genitalia   . Abnormal Pap smear of cervix     Axis IV: psychosocial   Treatment Plan and Summary:  Continue Lamictal 100 mg twice a day for mood symptoms of bipolar. No rash reported.   Alternate trazadone and vistaril for sleep. Prn vistaril for anxiety Insomnia:  sleep hygiene and better manage of pain.  I do highly recommend sure she follows up with psychotherapy in order to work on her anxiety, insomnia and medication compliance  Pertinent Labs and Relevant Prior Notes reviewed. Medication Side effects, benefits and risks reviewed/discussed with Patient. Time given for patient to respond and asks questions regarding the Diagnosis and Medications. Safety concerns and to report to ER if suicidal or call 911. Relevant Medications refilled or called in to pharmacy.  Follow up with Primary care provider in regards to Medical conditions. Recommend compliance with medications and follow up office appointments. Discussed to avail opportunity to consider or/and continue Individual therapy with Counselor. Greater than 50% of time was spend in counseling and coordination of care with the patient.  followup in 2 months.   Time spent: 25 minutes.  Merian Capron, MD 03/28/2015

## 2015-03-28 NOTE — Therapy (Signed)
[place], [place], [address] Phone: [phone]   Fax:  [phone]  Physical Therapy Treatment  Patient Details  Name: Kellie [name] MRN: [id number] Date of Birth: [birth year] Referring Provider:  Donella Stade, PA-C  Encounter Date: 03/28/2015      PT End of Session - 03/28/15 0931    Visit Number 6   Number of Visits 7   Date for PT Re-Evaluation 04/05/15   PT Start Time 0931   PT Stop Time 1013   PT Time Calculation (min) 42 min      Past Medical History  Diagnosis Date  . Hyperlipidemia   . Allergic rhinitis 11/08/2010  . Osteoporosis 11/21/2011  . Asthma   . Bronchitis   . Anxiety   . Mitral valve prolapse   . Herpes simplex of female genitalia   . Abnormal Pap smear of cervix     Past Surgical History  Procedure Laterality Date  . Leep  11/2004  . Closed manipulation shoulder  7&01/2005    x2, 30 days apart  . Esophagogastroduodenoscopy  09/2012    Normal (Dr. Ardis Hughs)  . Colonoscopy  09/2012    Medium sized external hemorrhoids, few small divertic in left colon, otherwise normal colon (repeat 10 yrs)  . Closed manipulation shoulder  2006    Left    There were no vitals filed for this visit.  Visit Diagnosis:  Weakness generalized  Pain in right buttock      Subjective Assessment - 03/28/15 0935    Subjective Pt went to race this weekend and had to stand for prolonged period of time;    Currently in Pain? Yes   Pain Score 9    Pain Location Back   Pain Orientation Left;Right  from low back to bra strap    Pain Descriptors / Indicators Dull   Aggravating Factors  standing still   Pain Relieving Factors moving, medication             [facility] PT Assessment - 03/28/15 0001    Assessment   Medical Diagnosis Rt SIJ dysfunction   Onset Date/Surgical Date 02/15/15           [facility] Adult PT Treatment/Exercise - 03/28/15 0001    Lumbar Exercises: Stretches   Double Knee to Chest Stretch 30 seconds   ITB Stretch 2 reps;30 seconds  each leg, with strap   Lumbar Exercises: Aerobic   Stationary Bike Nustep L5 x 7'   Lumbar Exercises: Supine   Bridge 15 reps  single leg each side   Other Supine Lumbar Exercises ball passes hands to/from knees x10   Lumbar Exercises: Prone   Other Prone Lumbar Exercises 10 press ups   Lumbar Exercises: Quadruped   [place] 10 reps   Straight Leg Raise 15 reps  bilat, donkey kicks   Straight Leg Raises Limitations then fire hydrants x 10 reps each side.    Manual Therapy   Manual Therapy Soft tissue mobilization   Manual therapy comments TPR to Rt piriformis, Lt lumbar paraspinals and QL    Soft tissue mobilization to Rt glutes     Pt declined use of modalities today despite reporting elevated pain level.        PT Long Term Goals - 03/22/15 1016    PT LONG TERM GOAL #1   Title I with HEP ( 04/05/15)   Status Achieved   PT LONG TERM GOAL #2   Title  increase bilat hip strength =/> 5-/5 ( 04/05/15)    Status On-going   PT LONG TERM GOAL #3   Title report pain decrease in Rt buttocks/hip =/> 75% to allow her to stand per her previous level ( 04/05/15)    Status On-going   PT LONG TERM GOAL #4   Title improve FOTO =/< 41% limited ( 04/05/15)    Status On-going               Plan - 03/28/15 1126    Clinical Impression Statement Pt tolerated exercises with minimal increase in pain.  Noted decrease in muscle tightness and tone with manual therapy, however, pt reported only minimal change in pain level. Pt progressing towards established goals.    Pt will benefit from skilled therapeutic intervention in order to improve on the following deficits Decreased strength;Pain;Decreased activity tolerance;Increased muscle spasms   Rehab Potential Good   PT Frequency 2x / week   PT Duration 4 weeks   PT Treatment/Interventions Manual techniques;Therapeutic exercise;Moist Heat;Electrical  Stimulation;Cryotherapy;Dry needling;Passive range of motion;Patient/family education;Ultrasound;Traction   PT Next Visit Plan continue core stabilization exercises, assess pelvis alignment.    Consulted and Agree with Plan of Care Patient        Problem List Patient Active Problem List   Diagnosis Date Noted  . Piriformis syndrome 03/01/2015  . Chronic nasal congestion 03/01/2015  . Vitreous floaters of right eye 03/01/2015  . Sacroiliac joint dysfunction of right side 02/25/2015  . Near syncope 02/09/2015  . Sinusitis 02/09/2015  . Rhinitis, allergic 10/14/2014  . Family history of renal cancer 10/11/2014  . DDD (degenerative disc disease), lumbar 10/11/2014  . Bipolar disorder with depression ([place]) 06/02/2014  . Postmenopausal atrophic vaginitis 03/17/2014  . Prediabetes 12/14/2013  . Adhesive capsulitis of right shoulder 11/24/2013  . Chronic periodontal disease 06/30/2013  . Genital herpes 11/05/2012  . Hemorrhoids 11/05/2012  . Borderline intellectual functioning 02/15/2012  . Osteoporosis 11/21/2011  . Allergic rhinitis 11/08/2010  . Insomnia 11/08/2010  . GERD (gastroesophageal reflux disease) 09/27/2010    Kerin Perna, PTA 03/28/2015 11:31 AM  [place], [place], [address] Phone: [phone]   Fax:  [phone]

## 2015-04-07 ENCOUNTER — Ambulatory Visit (INDEPENDENT_AMBULATORY_CARE_PROVIDER_SITE_OTHER): Payer: Self-pay | Admitting: Physical Therapy

## 2015-04-07 DIAGNOSIS — R531 Weakness: Secondary | ICD-10-CM

## 2015-04-07 DIAGNOSIS — M791 Myalgia: Secondary | ICD-10-CM

## 2015-04-07 DIAGNOSIS — M7918 Myalgia, other site: Secondary | ICD-10-CM

## 2015-04-07 NOTE — Therapy (Signed)
[place], [place], [address] Phone: [phone]   Fax:  [phone]  Physical Therapy Treatment  Patient Details  Name: Kellie [name] MRN: [id number] Date of Birth: [birth year] Referring Provider:  Donella Stade, PA-C  Encounter Date: 04/07/2015      PT End of Session - 04/07/15 0851    Visit Number 7   Number of Visits 11   Date for PT Re-Evaluation 05/05/15   PT Start Time 0851   PT Stop Time 0937   PT Time Calculation (min) 46 min   Activity Tolerance Patient tolerated treatment well      Past Medical History  Diagnosis Date  . Hyperlipidemia   . Allergic rhinitis 11/08/2010  . Osteoporosis 11/21/2011  . Asthma   . Bronchitis   . Anxiety   . Mitral valve prolapse   . Herpes simplex of female genitalia   . Abnormal Pap smear of cervix     Past Surgical History  Procedure Laterality Date  . Leep  11/2004  . Closed manipulation shoulder  7&01/2005    x2, 30 days apart  . Esophagogastroduodenoscopy  09/2012    Normal (Dr. Ardis Hughs)  . Colonoscopy  09/2012    Medium sized external hemorrhoids, few small divertic in left colon, otherwise normal colon (repeat 10 yrs)  . Closed manipulation shoulder  2006    Left    There were no vitals filed for this visit.  Visit Diagnosis:  Weakness generalized - Plan: PT plan of care cert/re-cert  Pain in right buttock - Plan: PT plan of care cert/re-cert      Subjective Assessment - 04/07/15 0854    Subjective Pt reports she doesn't have pain until she does things.  Hasn't been able to perform the elliptical, can do the treadmill.    Patient Stated Goals walk, lift, jumping jacks/exercise   Currently in Pain? --  no pain at this time. However has it up to 9/10 with standing and exercise            [facility] PT Assessment - 04/07/15 0001    Assessment   Medical Diagnosis Rt SIJ dysfunction   Onset Date/Surgical Date 02/15/15   Next MD  Visit not scheduled   Observation/Other Assessments   Focus on Therapeutic Outcomes (FOTO)  50% limited   Strength   Overall Strength Comments TA good (-), multifidi   Strength Assessment Site Hip   Right/Left Hip Right;Left   Right Hip Flexion 5/5   Right Hip Extension 4+/5   Right Hip ABduction 5/5   Left Hip Flexion 5/5   Left Hip Extension 4+/5   Left Hip ABduction --  5-/5   Flexibility   Soft Tissue Assessment /Muscle Length --  very tight in Rt QL, hypersensitive to touch.                      [facility] Adult PT Treatment/Exercise - 04/07/15 0001    Lumbar Exercises: Aerobic   Stationary Bike Nustep L5 x 7'   Lumbar Exercises: Quadruped   Madcat/Old Horse 15 reps   Modalities   Modalities Ultrasound   Ultrasound   Ultrasound Location Rt QL   Ultrasound Parameters combo, 100% 1.0 mHz, 1.5 w/cm2   Ultrasound Goals Pain   Manual Therapy   Manual Therapy Soft tissue mobilization   Soft tissue mobilization Rt QL and upper gluts          Trigger Point Dry  Needling - 04/07/15 0953    Consent Given? Yes   Education Handout Provided No  verbally reviewed precautions, expectations   Muscles Treated Lower Body --  Rt QL with good twitch response                   PT Long Term Goals - 04/07/15 0856    PT LONG TERM GOAL #1   Title I with HEP ( 04/05/15)   Status Achieved   PT LONG TERM GOAL #2   Title increase bilat hip strength =/> 5-/5 ( 05/05/15)    Time 4   Period Weeks   Status Partially Met   PT LONG TERM GOAL #3   Title report pain decrease in Rt buttocks/hip =/> 75% to allow her to stand per her previous level ( 05/05/15)    Status On-going  pt reports she is having more pain since starting therapy, worse pain with standing   PT LONG TERM GOAL #4   Title improve FOTO =/< 41% limited ( 05/05/15)    Time 4   Period Weeks   Status On-going  reports 50% improvement               Plan - 04/07/15 0949    Clinical Impression  Statement Pt has made some progress towards her goals.  She continues to have very tight low back musculature.  She agreed to try some dry needling and had a good release in the Rt QL. I feel like she would benefit from some more therapy to further release these muscles and then provide deep core stabilizations.    Pt will benefit from skilled therapeutic intervention in order to improve on the following deficits Decreased strength;Pain;Decreased activity tolerance;Increased muscle spasms   Rehab Potential Good   PT Frequency 1x / week   PT Duration 4 weeks   PT Treatment/Interventions Manual techniques;Therapeutic exercise;Moist Heat;Electrical Stimulation;Cryotherapy;Dry needling;Passive range of motion;Patient/family education;Ultrasound;Traction   PT Next Visit Plan continue core stabilization exercises, assess pelvis alignment and dry needling.    Consulted and Agree with Plan of Care Patient        Problem List Patient Active Problem List   Diagnosis Date Noted  . Piriformis syndrome 03/01/2015  . Chronic nasal congestion 03/01/2015  . Vitreous floaters of right eye 03/01/2015  . Sacroiliac joint dysfunction of right side 02/25/2015  . Near syncope 02/09/2015  . Sinusitis 02/09/2015  . Rhinitis, allergic 10/14/2014  . Family history of renal cancer 10/11/2014  . DDD (degenerative disc disease), lumbar 10/11/2014  . Bipolar disorder with depression ([place]) 06/02/2014  . Postmenopausal atrophic vaginitis 03/17/2014  . Prediabetes 12/14/2013  . Adhesive capsulitis of right shoulder 11/24/2013  . Chronic periodontal disease 06/30/2013  . Genital herpes 11/05/2012  . Hemorrhoids 11/05/2012  . Borderline intellectual functioning 02/15/2012  . Osteoporosis 11/21/2011  . Allergic rhinitis 11/08/2010  . Insomnia 11/08/2010  . GERD (gastroesophageal reflux disease) 09/27/2010    Jeral Pinch PT 04/07/2015, 9:55 AM  [facility] [place], [place], [address] Phone: [phone]   Fax:  [phone]

## 2015-04-11 ENCOUNTER — Ambulatory Visit (INDEPENDENT_AMBULATORY_CARE_PROVIDER_SITE_OTHER): Payer: Self-pay | Admitting: Physical Therapy

## 2015-04-11 DIAGNOSIS — M7918 Myalgia, other site: Secondary | ICD-10-CM

## 2015-04-11 DIAGNOSIS — R531 Weakness: Secondary | ICD-10-CM

## 2015-04-11 DIAGNOSIS — M791 Myalgia: Secondary | ICD-10-CM

## 2015-04-11 NOTE — Therapy (Signed)
[place], [place], [address] Phone: [phone]   Fax:  [phone]  Physical Therapy Treatment  Patient Details  Name: Kellie [name] MRN: [id number] Date of Birth: 1956-03-28 No Data Recorded  Encounter Date: 04/11/2015      PT End of Session - 04/11/15 0935    Visit Number 8   Number of Visits 11   Date for PT Re-Evaluation 05/05/15   PT Start Time 0932   PT Stop Time 1022   PT Time Calculation (min) 50 min   Activity Tolerance Patient tolerated treatment well      Past Medical History  Diagnosis Date  . Hyperlipidemia   . Allergic rhinitis 11/08/2010  . Osteoporosis 11/21/2011  . Asthma   . Bronchitis   . Anxiety   . Mitral valve prolapse   . Herpes simplex of female genitalia   . Abnormal Pap smear of cervix     Past Surgical History  Procedure Laterality Date  . Leep  11/2004  . Closed manipulation shoulder  7&01/2005    x2, 30 days apart  . Esophagogastroduodenoscopy  09/2012    Normal (Dr. Ardis Hughs)  . Colonoscopy  09/2012    Medium sized external hemorrhoids, few small divertic in left colon, otherwise normal colon (repeat 10 yrs)  . Closed manipulation shoulder  2006    Left    There were no vitals filed for this visit.  Visit Diagnosis:  Weakness generalized  Pain in right buttock      Subjective Assessment - 04/11/15 1003    Subjective Pt very frustrated with not getting better and still having pain.    Currently in Pain? Yes   Pain Score 9    Pain Location Back   Pain Orientation Right   Pain Descriptors / Indicators Aching;Sore   Pain Type Acute pain   Pain Radiating Towards Rt buttocks   Aggravating Factors  prolonged standing   Pain Relieving Factors moving                          [facility] Adult PT Treatment/Exercise - 04/11/15 0001    Self-Care   Self-Care --  Good alignment of pelvis and lower body   Lumbar Exercises: Supine   Bridge  20 reps   Isometric Hip Flexion 10 reps   Isometric Hip Flexion Limitations same side/opposite leg   Other Supine Lumbar Exercises pilates table top with heel taps   Ultrasound   Ultrasound Location Rt QL and lumbar paraspinals   Ultrasound Parameters combo, US/stim 100%, 3.47mz, 1.5 w/cm2   Ultrasound Goals Pain   Manual Therapy   Manual Therapy Soft tissue mobilization   Manual therapy comments grade II lumbar CPA mobs   Soft tissue mobilization Rt QL, upper gluts and lumbar paraspinals                PT Education - 04/11/15 1025    Education Details HEP   Person(s) Educated Patient   Methods Demonstration;Handout;Explanation   Comprehension Returned demonstration             PT Long Term Goals - 04/11/15 1031    PT LONG TERM GOAL #1   Title I with HEP ( 04/05/15)   Status Achieved   PT LONG TERM GOAL #2   Title increase bilat hip strength =/> 5-/5 ( 05/05/15)    Status Partially Met   PT LONG TERM GOAL #3  Title report pain decrease in Rt buttocks/hip =/> 75% to allow her to stand per her previous level ( 05/05/15)    Status On-going   PT LONG TERM GOAL #4   Title improve FOTO =/< 41% limited ( 05/05/15)    Status On-going               Plan - 04/11/15 1026    Clinical Impression Statement Pt reports she is highly motivated to get well however is frustrated with not having relief that lasts for a long time. She respnds well to treatment always reports less pain following her visit.  No new goals met. Cont to have core weakness and  mulscular tighness that returns.    Pt will benefit from skilled therapeutic intervention in order to improve on the following deficits Decreased strength;Pain;Decreased activity tolerance;Increased muscle spasms   Rehab Potential Good   PT Frequency 1x / week   PT Duration 4 weeks   PT Treatment/Interventions Manual techniques;Therapeutic exercise;Moist Heat;Electrical Stimulation;Cryotherapy;Dry needling;Passive range  of motion;Patient/family education;Ultrasound;Traction   PT Next Visit Plan continue core stabilization exercises, assess pelvis alignment and dry needling - if pt agrees.    Consulted and Agree with Plan of Care Patient        Problem List Patient Active Problem List   Diagnosis Date Noted  . Piriformis syndrome 03/01/2015  . Chronic nasal congestion 03/01/2015  . Vitreous floaters of right eye 03/01/2015  . Sacroiliac joint dysfunction of right side 02/25/2015  . Near syncope 02/09/2015  . Sinusitis 02/09/2015  . Rhinitis, allergic 10/14/2014  . Family history of renal cancer 10/11/2014  . DDD (degenerative disc disease), lumbar 10/11/2014  . Bipolar disorder with depression ([place]) 06/02/2014  . Postmenopausal atrophic vaginitis 03/17/2014  . Prediabetes 12/14/2013  . Adhesive capsulitis of right shoulder 11/24/2013  . Chronic periodontal disease 06/30/2013  . Genital herpes 11/05/2012  . Hemorrhoids 11/05/2012  . Borderline intellectual functioning 02/15/2012  . Osteoporosis 11/21/2011  . Allergic rhinitis 11/08/2010  . Insomnia 11/08/2010  . GERD (gastroesophageal reflux disease) 09/27/2010    Jeral Pinch, PT 04/11/2015, 10:33 AM  [facility] [place], [place], [address] Phone: [phone]   Fax:  [phone]  Name: Kellie [name] MRN: [id number] Date of Birth: [birth year]

## 2015-04-11 NOTE — Patient Instructions (Addendum)
Bracing With Bridging (Hook-Lying)    With neutral spine, tighten pelvic floor and abdominals and hold. Lift bottom. Repeat 20-30 times. Do _1__ times a day.    Isometric hip flexion in hook lying, same side and alternate , 10 reps, 5 sec holds once a day

## 2015-04-19 ENCOUNTER — Encounter: Payer: Self-pay | Admitting: Physical Therapy

## 2015-04-19 ENCOUNTER — Ambulatory Visit (INDEPENDENT_AMBULATORY_CARE_PROVIDER_SITE_OTHER): Payer: Self-pay | Admitting: Physical Therapy

## 2015-04-19 DIAGNOSIS — M791 Myalgia: Secondary | ICD-10-CM

## 2015-04-19 DIAGNOSIS — M7918 Myalgia, other site: Secondary | ICD-10-CM

## 2015-04-19 DIAGNOSIS — R531 Weakness: Secondary | ICD-10-CM

## 2015-04-19 NOTE — Therapy (Signed)
[place], [place], [address] Phone: [phone]   Fax:  [phone]  Physical Therapy Treatment  Patient Details  Name: Kellie [name] MRN: [id number] Date of Birth: 09/11/1955 No Data Recorded  Encounter Date: 04/19/2015      PT End of Session - 04/19/15 0939    Visit Number 9   Number of Visits 11   Date for PT Re-Evaluation 05/05/15   PT Start Time 0936   PT Stop Time 1015   PT Time Calculation (min) 39 min      Past Medical History  Diagnosis Date  . Hyperlipidemia   . Allergic rhinitis 11/08/2010  . Osteoporosis 11/21/2011  . Asthma   . Bronchitis   . Anxiety   . Mitral valve prolapse   . Herpes simplex of female genitalia   . Abnormal Pap smear of cervix     Past Surgical History  Procedure Laterality Date  . Leep  11/2004  . Closed manipulation shoulder  7&01/2005    x2, 30 days apart  . Esophagogastroduodenoscopy  09/2012    Normal (Dr. Ardis Hughs)  . Colonoscopy  09/2012    Medium sized external hemorrhoids, few small divertic in left colon, otherwise normal colon (repeat 10 yrs)  . Closed manipulation shoulder  2006    Left    There were no vitals filed for this visit.  Visit Diagnosis:  Weakness generalized  Pain in right buttock      Subjective Assessment - 04/19/15 0937    Subjective Has been feet alot the last couple of days so her pain is up   Pain Score 9    Pain Location Back   Pain Orientation Right   Aggravating Factors  standing   Pain Relieving Factors moving around                         [facility] Adult PT Treatment/Exercise - 04/19/15 0001    Lumbar Exercises: Stretches   Single Knee to Chest Stretch 20 seconds  bilat   Double Knee to Chest Stretch 20 seconds   Lower Trunk Rotation 3 reps  with 5 slow breaths each side   Lumbar Exercises: Aerobic   Stationary Bike Nustep L5 x 7'   Lumbar Exercises: Standing   Other Standing Lumbar  Exercises 2x10 FWD step up on 6' with opposite leg ext/lift   Other Standing Lumbar Exercises 2x10 upper trunk rotation holding yellow medicine ball.    Lumbar Exercises: Supine   Bridge 20 reps;5 seconds  with feet on ball   Other Supine Lumbar Exercises ball passes knees/hands to fatigue   Lumbar Exercises: Quadruped   Plank 3x5 with pelvic dips                     PT Long Term Goals - 04/19/15 0957    PT LONG TERM GOAL #1   Title I with HEP ( 04/05/15)   Status Achieved   PT LONG TERM GOAL #2   Title increase bilat hip strength =/> 5-/5 ( 05/05/15)    Status Partially Met   PT LONG TERM GOAL #3   Title report pain decrease in Rt buttocks/hip =/> 75% to allow her to stand per her previous level ( 05/05/15)    Status On-going   PT LONG TERM GOAL #4   Title improve FOTO =/< 41% limited ( 05/05/15)    Status On-going  Plan - 04/19/15 0954    Clinical Impression Statement Pt is slowly getting stronger through the core, pain continues to be the biggest limiting factor.    Pt will benefit from skilled therapeutic intervention in order to improve on the following deficits Decreased strength;Pain;Decreased activity tolerance;Increased muscle spasms   Rehab Potential Good   PT Frequency 1x / week   PT Duration 4 weeks   PT Next Visit Plan continue core work teach ball exercise   Consulted and Agree with Plan of Care Patient        Problem List Patient Active Problem List   Diagnosis Date Noted  . Piriformis syndrome 03/01/2015  . Chronic nasal congestion 03/01/2015  . Vitreous floaters of right eye 03/01/2015  . Sacroiliac joint dysfunction of right side 02/25/2015  . Near syncope 02/09/2015  . Sinusitis 02/09/2015  . Rhinitis, allergic 10/14/2014  . Family history of renal cancer 10/11/2014  . DDD (degenerative disc disease), lumbar 10/11/2014  . Bipolar disorder with depression ([place]) 06/02/2014  . Postmenopausal atrophic vaginitis  03/17/2014  . Prediabetes 12/14/2013  . Adhesive capsulitis of right shoulder 11/24/2013  . Chronic periodontal disease 06/30/2013  . Genital herpes 11/05/2012  . Hemorrhoids 11/05/2012  . Borderline intellectual functioning 02/15/2012  . Osteoporosis 11/21/2011  . Allergic rhinitis 11/08/2010  . Insomnia 11/08/2010  . GERD (gastroesophageal reflux disease) 09/27/2010    Jeral Pinch PT 04/19/2015, 10:16 AM  [facility] [place], [place], [address] Phone: [phone]   Fax:  [phone]  Name: Kellie [name] MRN: [id number] Date of Birth: 11-09-1955

## 2015-04-26 ENCOUNTER — Ambulatory Visit (INDEPENDENT_AMBULATORY_CARE_PROVIDER_SITE_OTHER): Payer: Self-pay | Admitting: Physical Therapy

## 2015-04-26 ENCOUNTER — Other Ambulatory Visit: Payer: Self-pay | Admitting: Physician Assistant

## 2015-04-26 ENCOUNTER — Telehealth: Payer: Self-pay | Admitting: *Deleted

## 2015-04-26 DIAGNOSIS — J301 Allergic rhinitis due to pollen: Secondary | ICD-10-CM

## 2015-04-26 DIAGNOSIS — H539 Unspecified visual disturbance: Secondary | ICD-10-CM

## 2015-04-26 DIAGNOSIS — M7918 Myalgia, other site: Secondary | ICD-10-CM

## 2015-04-26 DIAGNOSIS — R531 Weakness: Secondary | ICD-10-CM

## 2015-04-26 DIAGNOSIS — M791 Myalgia: Secondary | ICD-10-CM

## 2015-04-26 NOTE — Telephone Encounter (Signed)
Spoke with pt about her ENT referral to [facility]....she needs it to be to a [place] facility only.  She also needs a referral to an eye dr in [place] as well.

## 2015-04-26 NOTE — Therapy (Addendum)
[place], [place], [address] Phone: [phone]   Fax:  [phone]  Physical Therapy Treatment  Patient Details  Name: Kellie [name] MRN: [id number] Date of Birth: 07/05/1955 No Data Recorded  Encounter Date: 04/26/2015      PT End of Session - 04/26/15 0934    Visit Number 10   Number of Visits 11   Date for PT Re-Evaluation 05/05/15   PT Start Time 0933   PT Stop Time 1013   PT Time Calculation (min) 40 min   Activity Tolerance Patient tolerated treatment well      Past Medical History  Diagnosis Date  . Hyperlipidemia   . Allergic rhinitis 11/08/2010  . Osteoporosis 11/21/2011  . Asthma   . Bronchitis   . Anxiety   . Mitral valve prolapse   . Herpes simplex of female genitalia   . Abnormal Pap smear of cervix     Past Surgical History  Procedure Laterality Date  . Leep  11/2004  . Closed manipulation shoulder  7&01/2005    x2, 30 days apart  . Esophagogastroduodenoscopy  09/2012    Normal (Dr. Ardis Hughs)  . Colonoscopy  09/2012    Medium sized external hemorrhoids, few small divertic in left colon, otherwise normal colon (repeat 10 yrs)  . Closed manipulation shoulder  2006    Left    There were no vitals filed for this visit.  Visit Diagnosis:  Weakness generalized  Pain in right buttock      Subjective Assessment - 04/26/15 0934    Subjective went to the gym yesterday after not going for about a week. Pain is decreasing except for when at the races and has to stand for a long time.  Wants to start her  jumping jacks again.    Patient Stated Goals walk, lift, jumping jacks/exercise   Currently in Pain? No/denies                         [facility] Adult PT Treatment/Exercise - 04/26/15 0001    Lumbar Exercises: Stretches   Passive Hamstring Stretch 2 reps;30 seconds  with strap   ITB Stretch 2 reps;30 seconds  with strap   Lumbar Exercises: Aerobic   Stationary Bike Nustep L5 x 7'   Lumbar Exercises: Standing   Other Standing Lumbar Exercises rebounder jumping and jumping jacks.    Other Standing Lumbar Exercises jumping jacks on land.    Lumbar Exercises: Seated   Hip Flexion on Ball --  with BWD lean moving arms   Lumbar Exercises: Supine   Bridge 10 reps  with feet on ball, then with HS curls   Other Supine Lumbar Exercises ball passes knees/hands to fatigue   Lumbar Exercises: Prone   Other Prone Lumbar Exercises plank on ball   Lumbar Exercises: Quadruped   Opposite Arm/Leg Raise 10 reps;Left arm/Right leg;Right arm/Left leg  support on blue pads.                      PT Long Term Goals - 04/26/15 1006    PT LONG TERM GOAL #1   Title I with HEP ( 04/05/15)   Status Achieved   PT LONG TERM GOAL #2   Title increase bilat hip strength =/> 5-/5 ( 05/05/15)    Status Partially Met   PT LONG TERM GOAL #3   Status On-going   PT LONG TERM GOAL #4  Title improve FOTO =/< 41% limited ( 05/05/15)    Status On-going               Plan - 04/26/15 1006    Clinical Impression Statement Pt tolerating more advanced HEP for core strengthening.  She is tolerating more before her pain kicks in.  Standing is still her hardest activity.    Pt will benefit from skilled therapeutic intervention in order to improve on the following deficits Decreased strength;Pain;Decreased activity tolerance;Increased muscle spasms   Rehab Potential Good   PT Frequency 1x / week   PT Duration 4 weeks   PT Treatment/Interventions Manual techniques;Therapeutic exercise;Moist Heat;Electrical Stimulation;Cryotherapy;Dry needling;Passive range of motion;Patient/family education;Ultrasound;Traction   PT Next Visit Plan continue with core strengthening, higher level, FOTO, most likely D/C to HEP   Consulted and Agree with Plan of Care Patient        Problem List Patient Active Problem List   Diagnosis Date Noted  . Piriformis  syndrome 03/01/2015  . Chronic nasal congestion 03/01/2015  . Vitreous floaters of right eye 03/01/2015  . Sacroiliac joint dysfunction of right side 02/25/2015  . Near syncope 02/09/2015  . Sinusitis 02/09/2015  . Rhinitis, allergic 10/14/2014  . Family history of renal cancer 10/11/2014  . DDD (degenerative disc disease), lumbar 10/11/2014  . Bipolar disorder with depression ([place]) 06/02/2014  . Postmenopausal atrophic vaginitis 03/17/2014  . Prediabetes 12/14/2013  . Adhesive capsulitis of right shoulder 11/24/2013  . Chronic periodontal disease 06/30/2013  . Genital herpes 11/05/2012  . Hemorrhoids 11/05/2012  . Borderline intellectual functioning 02/15/2012  . Osteoporosis 11/21/2011  . Allergic rhinitis 11/08/2010  . Insomnia 11/08/2010  . GERD (gastroesophageal reflux disease) 09/27/2010    SHAVER,SUE 04/26/2015, 10:12 AM  [facility] [place], [place], [address] Phone: [phone]   Fax:  [phone]  Name: Kellie [name] MRN: [id number] Date of Birth: 15-Aug-1955    PHYSICAL THERAPY DISCHARGE SUMMARY  Visits from Start of Care: 10  Current functional level related to goals / functional outcomes: Working on core stabilization/strengthening; has returned to normal functional activity level   Remaining deficits: Needs to continue with higher level core stabilization exercise program   Education / Equipment: HEP Plan: Patient agrees to discharge.  Patient goals were partially met. Patient is being discharged due to being pleased with the current functional level.  ?????    Celyn P. Helene Kelp PT, MPH 06/03/2015 8:26 AM

## 2015-04-27 ENCOUNTER — Encounter: Payer: Self-pay | Admitting: Physician Assistant

## 2015-04-27 ENCOUNTER — Other Ambulatory Visit (HOSPITAL_COMMUNITY)
Admission: RE | Admit: 2015-04-27 | Discharge: 2015-04-27 | Disposition: A | Discharge status: Home / Self Care | Payer: Self-pay | Source: Ambulatory Visit | Attending: Physician Assistant | Admitting: Physician Assistant

## 2015-04-27 ENCOUNTER — Ambulatory Visit (INDEPENDENT_AMBULATORY_CARE_PROVIDER_SITE_OTHER): Payer: Self-pay | Admitting: Physician Assistant

## 2015-04-27 VITALS — BP 119/66 | HR 75

## 2015-04-27 DIAGNOSIS — N952 Postmenopausal atrophic vaginitis: Secondary | ICD-10-CM

## 2015-04-27 DIAGNOSIS — Z01419 Encounter for gynecological examination (general) (routine) without abnormal findings: Secondary | ICD-10-CM | POA: Insufficient documentation

## 2015-04-27 DIAGNOSIS — Z1151 Encounter for screening for human papillomavirus (HPV): Secondary | ICD-10-CM | POA: Insufficient documentation

## 2015-04-27 NOTE — Progress Notes (Signed)
  Subjective:     Kellie [name] is a 59 y.o. woman who comes in today for a  pap smear only. Her most recent annual exam was on last month. Her most recent Pap smear was on 02/2014 and showed no abnormalities. Previous abnormal Pap smears: the last 2 were normal. She did have some abnormal cells and had LEEP done. Contraception: abstinence and post-menopausal.    History of abnml pap smear in 2011 with Dr. Raphael Gibney. [facility] was [facility]. No dysplasia found. Pap in 2014 was nml with negative HPV.  The following portions of the patient's history were reviewed and updated as appropriate: allergies, current medications, past family history, past medical history, past social history, past surgical history and problem list.  Review of Systems A comprehensive review of systems was negative.   Objective:    BP 119/66 mmHg  Pulse 75  SpO2 99% Pelvic Exam: external genitalia normal and vagina normal without discharge. Os was covered with scar tissue likely from LEEP and unable to advance brush.  Pap smear obtained.   Assessment:    Screening pap smear.   Plan:    Follow up in 5  years, or as indicated by Pap results.

## 2015-04-29 ENCOUNTER — Other Ambulatory Visit (HOSPITAL_COMMUNITY): Payer: Self-pay | Admitting: Psychiatry

## 2015-05-02 LAB — CYTOLOGY - PAP

## 2015-05-03 ENCOUNTER — Encounter: Payer: Self-pay | Admitting: Physical Therapy

## 2015-05-06 NOTE — Telephone Encounter (Signed)
Received medication request from [place] for Trazodone. Per Dr. De Nurse, pt is authorized for a medication refill for Trazodone 50mg , #30. Prescription was sent to pharmacy. Pt has a f/u appt on 05/24/15. Called and informed pt of prescription. Pt verbalizes understanding.

## 2015-05-24 ENCOUNTER — Encounter: Payer: Self-pay | Admitting: Allergy and Immunology

## 2015-05-24 ENCOUNTER — Ambulatory Visit (INDEPENDENT_AMBULATORY_CARE_PROVIDER_SITE_OTHER): Payer: Self-pay | Admitting: Allergy and Immunology

## 2015-05-24 VITALS — BP 98/62 | HR 70 | Temp 97.8°F | Resp 18 | Ht 66.0 in | Wt 117.6 lb

## 2015-05-24 DIAGNOSIS — J309 Allergic rhinitis, unspecified: Secondary | ICD-10-CM

## 2015-05-24 DIAGNOSIS — R059 Cough, unspecified: Secondary | ICD-10-CM

## 2015-05-24 DIAGNOSIS — R05 Cough: Secondary | ICD-10-CM

## 2015-05-24 DIAGNOSIS — H1010 Acute atopic conjunctivitis, unspecified eye: Secondary | ICD-10-CM

## 2015-05-24 DIAGNOSIS — R062 Wheezing: Secondary | ICD-10-CM

## 2015-05-24 MED ORDER — BUDESONIDE 32 MCG/ACT NA SUSP: NASAL | Status: DC

## 2015-05-24 MED ORDER — IPRATROPIUM BROMIDE 0.02 % IN SOLN
0.5000 mg | Freq: Once | RESPIRATORY_TRACT | Status: AC
  Administered 2015-05-24: 0.5 mg via RESPIRATORY_TRACT

## 2015-05-24 MED ORDER — BECLOMETHASONE DIPROPIONATE 80 MCG/ACT IN AERS: 2.0000 | INHALATION_SPRAY | RESPIRATORY_TRACT | Status: DC

## 2015-05-24 MED ORDER — LEVALBUTEROL HCL 1.25 MG/3ML IN NEBU
1.2500 mg | INHALATION_SOLUTION | Freq: Once | RESPIRATORY_TRACT | Status: AC
  Administered 2015-05-24: 1.25 mg via RESPIRATORY_TRACT

## 2015-05-24 NOTE — Patient Instructions (Addendum)
Take Home Sheet  1. Avoidance: Mite and Mold   2. Antihistamine: Zyrtec 10mg  by mouth once daily for runny nose or itching.   3. Nasal Spray: Rhinocort 1-2 spray(s) each nostril once daily for stuffy nose or drainage.      STOP FLONASE  4. Inhalers:   Rescue: Ventolin 2 puffs every 4 hours as needed for cough or wheeze.       -May use 2 puffs 10-20 minutes prior to exercise.  Preventative: QVAR 27mcg 2 puffs once each morning to prevent cough and wheeze.  5. Nasal Saline wash followed by nasal spray once daily and saline nasal wash alone each evening at bath/shower time.   6. Follow up Visit:  2 months or sooner if needed.   Websites that have reliable Patient information: 1. American Academy of Asthma, Allergy, & Immunology: www.aaaai.org 2. Food Allergy Network: www.foodallergy.org 3. Mothers of Asthmatics: www.aanma.org 4. [place]: DiningCalendar.de 5. American College of Allergy, Asthma, & Immunology: https://robertson.info/ or www.acaai.org

## 2015-05-24 NOTE — Progress Notes (Signed)
NEW PATIENT NOTE  RE: Kellie [name] MRN: [id number] DOB: [birth year] [facility] [place], [place]  [address] Date of Office Visit: 05/24/2015  Referring provider: Donella Stade, PA-C [address] [place], [place] [address]  Subjective:  Kellie [name] is a 59 y.o. female who presents today for Sinus Problem  Assessment:   1. Allergic rhinoconjunctivitis   2. History of cough and wheeze with report of previous asthma diagnosis.    3. Complex medical history.    Plan:   Meds ordered this encounter  Medications  . levalbuterol (XOPENEX) nebulizer solution 1.25 mg    Sig:   . ipratropium (ATROVENT) nebulizer solution 0.5 mg    Sig:   . beclomethasone (QVAR) 80 MCG/ACT inhaler    Sig: Inhale 2 puffs into the lungs every morning.    Dispense:  1 Inhaler    Refill:  3  . budesonide (RHINOCORT AQUA) 32 MCG/ACT nasal spray    Sig: Use 1-2 sprays in each nostril once daily for stuffy nose or drainage.    Dispense:  1 Bottle    Refill:  5   Patient Instructions   1. Avoidance: Mite and Mold 2. Antihistamine: Zyrtec 10mg  by mouth once daily for runny nose or itching. 3. Nasal Spray: Rhinocort 1-2 spray(s) each nostril once daily for stuffy nose or drainage.      STOP FLONASE 4. Inhalers:       Rescue: Ventolin 2 puffs every 4 hours as needed for cough or wheeze.       -May use 2 puffs 10-20 minutes prior to exercise.      Preventative: QVAR 21mcg 2 puffs once each morning to prevent cough and wheeze. 5. Nasal Saline wash followed by nasal spray once daily and saline nasal wash alone each evening at bath/shower time. 6. Follow up Visit:  2 months or sooner if needed.  HPI: Kellie [name] presents with a 5 year history of rhinorrhea, congestion, sneezing, and itchy, watery eyes, often associated with postnasal drip and sinus pressure.  She feels her symptoms are year-round and may be provoked by  dust, pollen, outdoor, smoke and fluctuant weather patterns.  She does snore and has had episodes of cough and wheeze where using albuterol a few times a week and has been beneficial on rare occasions describes nocturnal but no exercise induced symptoms, and systemic steroid courses twice in the recent years.  She denies itching, rash, hives or swelling but describes sensitive skin and therefore uses Dove body wash and lotion and All free and clear detergent.  There is no history of eczema or chronic specific skin concerns, reflux or food sensitivities.  She believes there was an asthma diagnosis made 5 years ago but cannot recall last chest x-ray and denies ENT Visit or sinus CT scan.  There was a [facility] visit 2015 There are many symptom-free days, but several courses of antibiotics for nasal sinus symptoms.  Medical History: Past Medical History  Diagnosis Date  . Hyperlipidemia   . Allergic rhinitis 11/08/2010  . Osteoporosis 11/21/2011  . Asthma   . Bronchitis   . Anxiety   . Mitral valve prolapse   . Herpes simplex of female genitalia   . Abnormal Pap smear of cervix   . Urticaria    Surgical History: Past Surgical History  Procedure Laterality Date  . Leep  11/2004  . Closed manipulation shoulder  7&01/2005  x2, 30 days apart  . Esophagogastroduodenoscopy  09/2012    Normal (Dr. Ardis Hughs)  . Colonoscopy  09/2012    Medium sized external hemorrhoids, few small divertic in left colon, otherwise normal colon (repeat 10 yrs)  . Closed manipulation shoulder  2006    Left   Family History: Family History  Problem Relation Age of Onset  . Kidney cancer Mother 3  . COPD Mother   . Cancer Mother     renal  . Kidney disease Mother   . Allergic rhinitis Mother   . Asthma Mother   . Heart disease Father   . Allergic rhinitis Father   . COPD Sister   . Depression Sister   . Heart disease Brother   . Heart attack Brother   . Heart disease Paternal Uncle   . Colon cancer Neg Hx   .  Rectal cancer Neg Hx   . Stomach cancer Neg Hx   . Colon polyps Neg Hx   . Diabetes      neice  . Thyroid disease      neice  . Depression Maternal Aunt   . Depression Maternal Grandfather    Social History: Social History  . Marital Status: Divorced    Spouse Name: N/A  . Number of Children: 1  . Years of Education: N/A   Social History Main Topics  . Smoking status: Never Smoker   . Smokeless tobacco: Never Used  . Alcohol Use: No  . Drug Use: No  . Sexual Activity: Not Currently    Birth Control/ Protection: Post-menopausal   Medications prior to this encounter: Outpatient Prescriptions Prior to Visit  Medication Sig Dispense Refill  . acyclovir (ZOVIRAX) 200 MG capsule Take 2 capsules (400 mg total) by mouth 2 (two) times daily. 60 capsule 5  . Beclomethasone Dipropionate 80 MCG/ACT AERS Place 2 sprays into the nose daily. 1 Inhaler 2  . fluticasone (FLONASE) 50 MCG/ACT nasal spray One spray in each nostril twice a day, use left hand for right nostril, and right hand for left nostril. 48 g 3  . hydrOXYzine (VISTARIL) 25 MG capsule Take 1 capsule (25 mg total) by mouth daily. 30 capsule 1  . ibuprofen (ADVIL,MOTRIN) 800 MG tablet Take 1 tablet (800 mg total) by mouth every 8 (eight) hours as needed for mild pain. 30 tablet 0  . lamoTRIgine (LAMICTAL) 100 MG tablet Take 1 tablet (100 mg total) by mouth 2 (two) times daily. 60 tablet 1  . traZODone (DESYREL) 50 MG tablet TAKE ONE TABLET BY MOUTH AT BEDTIME 30 tablet 0  . loratadine (CLARITIN) 10 MG tablet Take 1 tablet (10 mg total) by mouth daily. (Patient not taking: Reported on 05/24/2015) 90 tablet 3   Drug Allergies: Allergies  Allergen Reactions  . Hydrocodone Nausea And Vomiting  . Codeine Nausea And Vomiting  . Morphine And Related Nausea And Vomiting  . Percocet [Oxycodone-Acetaminophen] Nausea And Vomiting and Other (See Comments)    Patient states it makes blood pressure drop too much  . Vicodin  [Hydrocodone-Acetaminophen] Nausea And Vomiting    Environmental History: Sheriyah lives in a 60 year old apartment for 4 years with carpet and tile floors, central air and heat without humidifier pets or smokers.  Review of Systems  Constitutional: Negative for fever, weight loss and malaise/fatigue.  HENT: Positive for congestion. Negative for ear pain, hearing loss, nosebleeds and sore throat.   Eyes: Negative for discharge and redness.  Respiratory: Negative for shortness of breath.  Denies history of bronchitis and pneumonia.  Gastrointestinal: Negative for heartburn, nausea, vomiting, abdominal pain, diarrhea and constipation.  Genitourinary: Negative.   Musculoskeletal: Negative for myalgias and joint pain.  Skin: Negative.  Negative for itching and rash.  Neurological: Negative.  Negative for dizziness, seizures, weakness and headaches.  Endo/Heme/Allergies: Positive for environmental allergies.       Denies sensitivity to aspirin, NSAIDs, stinging insects, foods, latex jewelry and cosmetics.    Objective:   Filed Vitals:   05/24/15 0850  BP: 98/62  Pulse: 70  Temp: 97.8 F (36.6 C)  Resp: 18  pulse ox:            98%  Physical Exam  Constitutional: She is well-developed, well-nourished, and in no distress.  HENT:  Head: Atraumatic.  Right Ear: Tympanic membrane and ear canal normal.  Left Ear: Tympanic membrane and ear canal normal.  Nose: Mucosal edema present. No rhinorrhea. No epistaxis.  Mouth/Throat: Oropharynx is clear and moist and mucous membranes are normal. No oropharyngeal exudate, posterior oropharyngeal edema or posterior oropharyngeal erythema.  Eyes: Conjunctivae are normal.  Neck: Neck supple.  Cardiovascular: Normal rate, S1 normal and S2 normal.   No murmur heard. Pulmonary/Chest: Effort normal. She has no wheezes. She has no rhonchi. She has no rales.  Abdominal: Soft. Normal appearance and bowel sounds are normal.  Musculoskeletal: She  exhibits no edema.  Lymphadenopathy:    She has no cervical adenopathy.  Neurological: She is alert.  Skin: Skin is warm and intact. No rash noted. No cyanosis. Nails show no clubbing.    Diagnostics: Spirometry:  FVC  2.90--90%, FEV1 2.50--97%, FEF25-75% 2.89--112%  Essentially no change postbronchodilator.     Roselyn M. Ishmael Holter, MD  cc: [place], [place]

## 2015-05-31 ENCOUNTER — Other Ambulatory Visit (HOSPITAL_COMMUNITY): Payer: Self-pay | Admitting: Psychiatry

## 2015-06-01 NOTE — Telephone Encounter (Signed)
Received medication request for Vistaril 25mg  from [facility]. Per Dr. De Nurse, pt is authorized a refill for Vistaril 25mg , #30. Prescription was sent to pharmacy. Pt has a f/u appt on 06/09/15. Called and informed pt of prescription status. Per verbalizes understanding.

## 2015-06-09 ENCOUNTER — Encounter (HOSPITAL_COMMUNITY): Payer: Self-pay | Admitting: Psychiatry

## 2015-06-09 ENCOUNTER — Ambulatory Visit (INDEPENDENT_AMBULATORY_CARE_PROVIDER_SITE_OTHER): Payer: Self-pay | Admitting: Psychiatry

## 2015-06-09 ENCOUNTER — Telehealth: Payer: Self-pay | Admitting: Allergy and Immunology

## 2015-06-09 DIAGNOSIS — F310 Bipolar disorder, current episode hypomanic: Secondary | ICD-10-CM

## 2015-06-09 DIAGNOSIS — G4700 Insomnia, unspecified: Secondary | ICD-10-CM

## 2015-06-09 DIAGNOSIS — F419 Anxiety disorder, unspecified: Secondary | ICD-10-CM

## 2015-06-09 MED ORDER — TRAZODONE HCL 50 MG PO TABS: 50.0000 mg | ORAL_TABLET | Freq: Every day | ORAL | Status: DC

## 2015-06-09 MED ORDER — LAMOTRIGINE 100 MG PO TABS: 100.0000 mg | ORAL_TABLET | Freq: Two times a day (BID) | ORAL | Status: DC

## 2015-06-09 MED ORDER — HYDROXYZINE PAMOATE 25 MG PO CAPS: 25.0000 mg | ORAL_CAPSULE | Freq: Every day | ORAL | Status: DC

## 2015-06-09 NOTE — Telephone Encounter (Signed)
Called and spoke with patient who said that now the reaction is just redness and some itchiness on patients right arm. Patient is wanting to know if this is normal and if she is allergic to something else now.

## 2015-06-09 NOTE — Telephone Encounter (Signed)
Pt was skin tested in our clinic about 9 days ago. She is having a reaction on her arm. Raised and red like a "strawberry" pls advise

## 2015-06-09 NOTE — Progress Notes (Signed)
Patient ID: Kellie [name], female   DOB: 07-30-1955, 59 y.o.   MRN: [id number]   [facility] Follow up Outpatient visit  Kellie [name] [id number] 59 y.o.  06/09/2015 9:06 AM  Chief Complaint:  Racing toughts at night . Follow up.  History of Present Illness:   Patient  Initially presented with racing toughts and cant sleep at night. Some concern of tingling in hands . Will get evaluated by primary care.  She has had improvement in her spiritual thoughts and mind racing with the Lamictal.  Bipolar: lamictal helpful no rash reported  Vistaril helps her anxiety and sleep. She alternates trazadone with vistaril. Sleep: still at timew variable to poor. Takes melatonin or trazadone at night.   Modifying factors; she spiritual she feels connected with God. She talks about her kids.  Severity of depression; 6 out of 10. 10 being no depression  Context; feeling lonely, worried about her age and finances Medical complexity: back pain and DDD makes her sleep and mood worse. She is going to physical therapy Denies drug, alcohol abuse or use.  Past Psychiatric History/Hospitalization(s) denies  Hospitalization for psychiatric illness: No History of Electroconvulsive Shock Therapy: No Prior Suicide Attempts: No  Medical History; Past Medical History  Diagnosis Date  . Hyperlipidemia   . Allergic rhinitis 11/08/2010  . Osteoporosis 11/21/2011  . Asthma   . Bronchitis   . Anxiety   . Mitral valve prolapse   . Herpes simplex of female genitalia   . Abnormal Pap smear of cervix   . Urticaria     Allergies: Allergies  Allergen Reactions  . Hydrocodone Nausea And Vomiting  . Codeine Nausea And Vomiting  . Morphine And Related Nausea And Vomiting  . Percocet [Oxycodone-Acetaminophen] Nausea And Vomiting and Other (See Comments)    Patient states it makes blood pressure drop too much  . Vicodin [Hydrocodone-Acetaminophen] Nausea And Vomiting     Medications: Outpatient Encounter Prescriptions as of 06/09/2015  Medication Sig  . acyclovir (ZOVIRAX) 200 MG capsule Take 2 capsules (400 mg total) by mouth 2 (two) times daily.  . Albuterol Sulfate (VENTOLIN HFA IN) Inhale into the lungs.  . beclomethasone (QVAR) 80 MCG/ACT inhaler Inhale 2 puffs into the lungs every morning.  . Beclomethasone Dipropionate 80 MCG/ACT AERS Place 2 sprays into the nose daily.  . budesonide (RHINOCORT AQUA) 32 MCG/ACT nasal spray Use 1-2 sprays in each nostril once daily for stuffy nose or drainage.  . famotidine (PEPCID) 10 MG tablet Take 10 mg by mouth 2 (two) times daily.  . fluticasone (FLONASE) 50 MCG/ACT nasal spray One spray in each nostril twice a day, use left hand for right nostril, and right hand for left nostril.  . hydrOXYzine (VISTARIL) 25 MG capsule Take 1 capsule (25 mg total) by mouth daily.  [name] Kitchen ibuprofen (ADVIL,MOTRIN) 800 MG tablet Take 1 tablet (800 mg total) by mouth every 8 (eight) hours as needed for mild pain.  [name] Kitchen lamoTRIgine (LAMICTAL) 100 MG tablet Take 1 tablet (100 mg total) by mouth 2 (two) times daily.  [name] Kitchen loratadine (CLARITIN) 10 MG tablet Take 1 tablet (10 mg total) by mouth daily. (Patient not taking: Reported on 05/24/2015)  . Melatonin 5 MG CAPS Take by mouth.  . Probiotic Product (PROBIOTIC & ACIDOPHILUS EX ST PO) Take by mouth.  . traZODone (DESYREL) 50 MG tablet Take 1 tablet (50 mg total) by mouth at bedtime.  . [DISCONTINUED] hydrOXYzine (VISTARIL) 25 MG capsule TAKE ONE CAPSULE BY MOUTH ONCE  DAILY  . [DISCONTINUED] lamoTRIgine (LAMICTAL) 100 MG tablet Take 1 tablet (100 mg total) by mouth 2 (two) times daily.  . [DISCONTINUED] traZODone (DESYREL) 50 MG tablet TAKE ONE TABLET BY MOUTH AT BEDTIME   No facility-administered encounter medications on file as of 06/09/2015.     Family History; Family History  Problem Relation Age of Onset  . Kidney cancer Mother 59  . COPD Mother   . Cancer Mother     renal  .  Kidney disease Mother   . Allergic rhinitis Mother   . Asthma Mother   . Heart disease Father   . Allergic rhinitis Father   . COPD Sister   . Depression Sister   . Heart disease Brother   . Heart attack Brother   . Heart disease Paternal Uncle   . Colon cancer Neg Hx   . Rectal cancer Neg Hx   . Stomach cancer Neg Hx   . Colon polyps Neg Hx   . Diabetes      neice  . Thyroid disease      neice  . Depression Maternal Aunt   . Depression Maternal Grandfather        Labs:  Recent Results (from the past 2160 hour(s))  Cytology - PAP     Status: None   Collection Time: 04/27/15 12:00 AM  Result Value Ref Range   CYTOLOGY - PAP PAP RESULT        Musculoskeletal: Strength & Muscle Tone: within normal limits Gait & Station: normal Patient leans: N/A  Mental Status Examination;   Psychiatric Specialty Exam: Physical Exam  Constitutional: She appears well-developed and well-nourished.  Skin: She is not diaphoretic.    Review of Systems  Constitutional: Negative for fever.  Musculoskeletal: Positive for myalgias and back pain.  Skin: Negative for rash.  Neurological: Negative for tremors and headaches.  Psychiatric/Behavioral: Negative for suicidal ideas. The patient has insomnia.     There were no vitals taken for this visit.There is no weight on file to calculate BMI.  General Appearance: Casual  Eye Contact::  Fair  Speech:  coherent  Volume:  Normal  Mood: less labile and more focused.   Affect:  congruent  Thought Process: clear . No psychosis  Orientation:  Full (Time, Place, and Person)  Thought Content:  Rumination  Suicidal Thoughts:  No  Homicidal Thoughts:  No  Memory:  Immediate;   Fair Recent;   Fair  Judgement:  Fair  Insight:  Shallow  Psychomotor Activity:  Increased  Concentration:  Fair  Recall:  Fair  Akathisia:  Negative  Handed:  Right  AIMS (if indicated):     Assets:  Desire for Improvement Physical Health Transportation   Sleep:        Assessment: Axis I: bipolar disorder II unspecified or depressed type. Anxiety disorder NOS. Insomnia  Axis II: deferred  Axis III:  Past Medical History  Diagnosis Date  . Hyperlipidemia   . Allergic rhinitis 11/08/2010  . Osteoporosis 11/21/2011  . Asthma   . Bronchitis   . Anxiety   . Mitral valve prolapse   . Herpes simplex of female genitalia   . Abnormal Pap smear of cervix   . Urticaria     Axis IV: psychosocial   Treatment Plan and Summary:  Continue Lamictal 100 mg twice a day for mood symptoms of bipolar. No rash reported.  GAD: vistaril prn. Coping skills for anxiety.  Alternate trazadone and vistaril for sleep. Prn vistaril for  anxiety Insomnia: sleep hygiene and better manage of pain.  I do highly recommend sure she follows up with psychotherapy in order to work on her anxiety, insomnia and medication compliance  Pertinent Labs and Relevant Prior Notes reviewed. Medication Side effects, benefits and risks reviewed/discussed with Patient. Time given for patient to respond and asks questions regarding the Diagnosis and Medications. Safety concerns and to report to ER if suicidal or call 911. Relevant Medications refilled or called in to pharmacy.  Follow up with Primary care provider in regards to Medical conditions. Recommend compliance with medications and follow up office appointments. Discussed to avail opportunity to consider or/and continue Individual therapy with Counselor. Greater than 50% of time was spend in counseling and coordination of care with the patient.  followup in 2 months.   Time spent: 25 minutes.  Merian Capron, MD 06/09/2015

## 2015-06-29 ENCOUNTER — Other Ambulatory Visit: Payer: Self-pay | Admitting: Physician Assistant

## 2015-06-29 DIAGNOSIS — R7303 Prediabetes: Secondary | ICD-10-CM

## 2015-06-29 DIAGNOSIS — E7800 Pure hypercholesterolemia, unspecified: Secondary | ICD-10-CM

## 2015-06-29 NOTE — Telephone Encounter (Signed)
Late documentation--Spoke with patient and reviewed again skin testing results and answered questions regarding arm testing sites. Patient verbalized understanding of  Results and that no new allergens are present based on any delayed changes.

## 2015-07-05 ENCOUNTER — Other Ambulatory Visit: Payer: Self-pay | Admitting: *Deleted

## 2015-07-05 ENCOUNTER — Ambulatory Visit (INDEPENDENT_AMBULATORY_CARE_PROVIDER_SITE_OTHER): Payer: No Typology Code available for payment source

## 2015-07-05 ENCOUNTER — Encounter: Payer: Self-pay | Admitting: Physician Assistant

## 2015-07-05 ENCOUNTER — Ambulatory Visit (INDEPENDENT_AMBULATORY_CARE_PROVIDER_SITE_OTHER): Payer: Self-pay | Admitting: Physician Assistant

## 2015-07-05 VITALS — BP 102/70 | HR 78 | Ht 66.0 in | Wt 116.0 lb

## 2015-07-05 DIAGNOSIS — J3489 Other specified disorders of nose and nasal sinuses: Secondary | ICD-10-CM

## 2015-07-05 DIAGNOSIS — M542 Cervicalgia: Secondary | ICD-10-CM

## 2015-07-05 DIAGNOSIS — M5412 Radiculopathy, cervical region: Secondary | ICD-10-CM

## 2015-07-05 DIAGNOSIS — M4312 Spondylolisthesis, cervical region: Secondary | ICD-10-CM

## 2015-07-05 DIAGNOSIS — M50322 Other cervical disc degeneration at C5-C6 level: Secondary | ICD-10-CM

## 2015-07-05 DIAGNOSIS — M5030 Other cervical disc degeneration, unspecified cervical region: Secondary | ICD-10-CM | POA: Insufficient documentation

## 2015-07-05 DIAGNOSIS — M7501 Adhesive capsulitis of right shoulder: Secondary | ICD-10-CM

## 2015-07-05 DIAGNOSIS — M7500 Adhesive capsulitis of unspecified shoulder: Secondary | ICD-10-CM | POA: Insufficient documentation

## 2015-07-05 MED ORDER — BACLOFEN 10 MG PO TABS: 10.0000 mg | ORAL_TABLET | Freq: Every day | ORAL | Status: DC

## 2015-07-05 MED ORDER — MELOXICAM 15 MG PO TABS: 15.0000 mg | ORAL_TABLET | Freq: Every day | ORAL | Status: DC

## 2015-07-05 MED ORDER — KETOROLAC TROMETHAMINE 30 MG/ML IJ SOLN
30.0000 mg | Freq: Once | INTRAMUSCULAR | Status: AC
  Administered 2015-07-05: 30 mg via INTRAMUSCULAR

## 2015-07-05 NOTE — Progress Notes (Signed)
   Subjective:    Patient ID: Kellie [name], female    DOB: 1955-07-02, 60 y.o.   MRN: [id number]  HPI  Pt is a 60 yo female who presents to the clinic with neck pain since march that seems to worsen over past 8 days. No trauma to neck. Aleve helps symptoms. Pain starts lower head and into upper back but mostly right sided. She notices when she gets stressed it is worse. Occasionally she will have some pain radiating down into bilateral arms at times but mostly right.   She has hx of right frozen shoulder. She does not want surgery and did physical therapy and helped in the past. Seems to be losing ROM over past few weeks.   Nose feels very dry and some occasional blood when wipes nose. No fever, chills, sinus pressure, ST, cough.    Review of Systems  All other systems reviewed and are negative.      Objective:   Physical Exam  Constitutional: She is oriented to person, place, and time. She appears well-developed and well-nourished.  HENT:  Head: Normocephalic and atraumatic.  Right Ear: External ear normal.  Left Ear: External ear normal.  Mouth/Throat: Oropharynx is clear and moist. No oropharyngeal exudate.  TM's clear.  Nasal turbinates not swollen but very erythematous and frail.   Cardiovascular: Normal rate, regular rhythm and normal heart sounds.   Pulmonary/Chest: Effort normal and breath sounds normal.  Musculoskeletal:  Right shoulder: Active and passive ROM 90 degrees abduction. Limited external and internal rotation due to pain.  Pain to palpation over c-spine and tightness over right upper back.  Normal ROM of neck.  Strength of right arm decreased at 3/5.   Neurological: She is alert and oriented to person, place, and time.  Psychiatric: She has a normal mood and affect. Her behavior is normal.          Assessment & Plan:  Neck pain/cervical DDD/right cervical radiculitis- will get xray today. Xray showed a lot of DDD and anterolisthesis. Certainly could be  causing pain.continue to feel like some muscle spasms accompany symptoms.  Baclofen given to take at bedtime. Will get some formal PT. If not improving needs to see sports med. Will call and see if patient would like prednisone taper.    Right adhesive capsulitis- will place order for PT. Hx of PT improving symptoms. mobic daily can help. Take with food. Watch for GI irritation. If not improving follow up with sports med in ofice to consider glenohumeral injection.   Nasal dryness- ayr nasal solution and gel. Stop flonase. Humidifier.

## 2015-07-05 NOTE — Patient Instructions (Addendum)
Tumeric for body inflammation. [employer].  AYR for nasal dryness.  PT for frozen shoulder.    Adhesive Capsulitis Adhesive capsulitis is inflammation of the tendons and ligaments that surround the shoulder joint (shoulder capsule). This condition causes the shoulder to become stiff and painful to move. Adhesive capsulitis is also called frozen shoulder. CAUSES This condition may be caused by:  An injury to the shoulder joint.  Straining the shoulder.  Not moving the shoulder for a period of time. This can happen if your arm was injured or in a sling.  Long-standing health problems, such as:  Diabetes.  Thyroid problems.  Heart disease.  Stroke.  Rheumatoid arthritis.  Lung disease. In some cases, the cause may not be known. RISK FACTORS This condition is more likely to develop in:  Women.  People who are older than 61 years of age. SYMPTOMS Symptoms of this condition include:  Pain in the shoulder when moving the arm. There may also be pain when parts of the shoulder are touched. The pain is worse at night or when at rest.  Soreness or aching in the shoulder.  Inability to move the shoulder normally.  Muscle spasms. DIAGNOSIS This condition is diagnosed with a physical exam and imaging tests, such as an X-ray or MRI. TREATMENT This condition may be treated with:  Treatment of the underlying cause or condition.  Physical therapy. This involves performing exercises to get the shoulder moving again.  Medicine. Medicine may be given to relieve pain, inflammation, or muscle spasms.  Steroid injections into the shoulder joint.  Shoulder manipulation. This is a procedure to move the shoulder into another position. It is done after you are given a medicine to make you fall asleep (general anesthetic). The joint may also be injected with salt water at high pressure to break down scarring.  Surgery. This may be done in severe cases when other treatments have  failed. Although most people recover completely from adhesive capsulitis, some may not regain the full movement of the shoulder. HOME CARE INSTRUCTIONS  Take over-the-counter and prescription medicines only as told by your health care provider.  If you are being treated with physical therapy, follow instructions from your physical therapist.  Avoid exercises that put a lot of demand on your shoulder, such as throwing. These exercises can make pain worse.  If directed, apply ice to the injured area:  Put ice in a plastic bag.  Place a towel between your skin and the bag.  Leave the ice on for 20 minutes, 2-3 times per day. SEEK MEDICAL CARE IF:  You develop new symptoms.  Your symptoms get worse.   This information is not intended to replace advice given to you by your health care provider. Make sure you discuss any questions you have with your health care provider.   Document Released: 04/08/2009 Document Revised: 03/02/2015 Document Reviewed: 10/04/2014 Elsevier Interactive Patient Education [employer].

## 2015-07-06 ENCOUNTER — Other Ambulatory Visit: Payer: Self-pay | Admitting: *Deleted

## 2015-07-06 ENCOUNTER — Telehealth: Payer: Self-pay | Admitting: *Deleted

## 2015-07-06 DIAGNOSIS — M5412 Radiculopathy, cervical region: Secondary | ICD-10-CM | POA: Insufficient documentation

## 2015-07-06 LAB — LIPID PANEL
Cholesterol: 217 mg/dL — ABNORMAL HIGH (ref 125–200)
HDL: 94 mg/dL (ref >=46)
LDL CALC: 114 mg/dL (ref <130)
Total CHOL/HDL Ratio: 2.3 Ratio (ref <=5.0)
Triglycerides: 46 mg/dL (ref <150)
VLDL: 9 mg/dL (ref <30)

## 2015-07-06 LAB — HEMOGLOBIN A1C
HEMOGLOBIN A1C: 5.9 % — AB (ref <5.7)
MEAN PLASMA GLUCOSE: 123 mg/dL — AB (ref <117)

## 2015-07-06 MED ORDER — ALPRAZOLAM 0.5 MG PO TABS: 0.5000 mg | ORAL_TABLET | ORAL | Status: DC | PRN

## 2015-07-06 NOTE — Telephone Encounter (Signed)
Pt left vm this morning wanting you to know that Dr. De Nurse won't write that rx.  I spoke with Jovita from his office & she confirmed.  Would you like me to print the last rx written from last year? Please advise.

## 2015-07-06 NOTE — Telephone Encounter (Signed)
I really would like to stick with the recommendation of your psychiatrist not to have xanax. I will give you 30 tabs NRF to use only for acute anxiety and not to use daily. Encourage you to work with psychiatrist if feel anxiety is worsening.   Amber please print and fax to pharmacy.

## 2015-07-06 NOTE — Telephone Encounter (Signed)
Pt notified rx faxed.

## 2015-07-06 NOTE — Telephone Encounter (Signed)
Just realized we didn't do a prednisone taper for cervical DDD. We could send this over as well. Would you like this? It can help a lot with inflammation.

## 2015-07-11 ENCOUNTER — Telehealth: Payer: Self-pay | Admitting: *Deleted

## 2015-07-11 DIAGNOSIS — M7501 Adhesive capsulitis of right shoulder: Secondary | ICD-10-CM

## 2015-07-11 NOTE — Telephone Encounter (Signed)
There is no medical need for this. Insurance will not pay. No new injury. MRI looks more at soft tissue more than bone xrays look at bone. So if you want an xray we can do but likely not going to show anything new.

## 2015-07-11 NOTE — Telephone Encounter (Signed)
Pt left vm stating that she's going to her initial PT appt tomorrow but wants to have an MRI of her shoulder since it's been about 9 months since her last one & she wants to know "what her bones are doing before they start all that deep therapy".

## 2015-07-12 ENCOUNTER — Encounter: Payer: Self-pay | Admitting: Physical Therapy

## 2015-07-12 ENCOUNTER — Ambulatory Visit: Payer: No Typology Code available for payment source | Attending: Physician Assistant | Admitting: Physical Therapy

## 2015-07-12 DIAGNOSIS — M436 Torticollis: Secondary | ICD-10-CM | POA: Insufficient documentation

## 2015-07-12 DIAGNOSIS — M542 Cervicalgia: Secondary | ICD-10-CM | POA: Insufficient documentation

## 2015-07-12 DIAGNOSIS — M25611 Stiffness of right shoulder, not elsewhere classified: Secondary | ICD-10-CM | POA: Insufficient documentation

## 2015-07-12 DIAGNOSIS — M546 Pain in thoracic spine: Secondary | ICD-10-CM | POA: Insufficient documentation

## 2015-07-12 DIAGNOSIS — R531 Weakness: Secondary | ICD-10-CM | POA: Insufficient documentation

## 2015-07-12 DIAGNOSIS — M25511 Pain in right shoulder: Secondary | ICD-10-CM | POA: Insufficient documentation

## 2015-07-12 NOTE — Patient Instructions (Signed)
  Flexibility: Upper Trapezius Stretch   Gently grasp right side of head while reaching behind back with other hand. Tilt head away until a gentle stretch is felt. Hold 30 seconds. Repeat 3 times per set. Do 2 sessions per day.  http://orth.exer.us/340   Levator Stretch   Grasp seat or sit on hand on side to be stretched. Turn head toward other side and look down. Use hand on head to gently stretch neck in that position. Hold _30___ seconds. Repeat on other side. Repeat 3 times. Do 2 sessions per day.  http://[email or web address].exer.us/30   Scapular Retraction (Standing)   With arms at sides, pinch shoulder blades together. Repeat 10 times per set. Do 1-3 sets per session. Do 2 sessions per day.  http://orth.exer.us/944    Posture - Sitting   Sit upright, head facing forward. Try using a roll to support lower back. Keep shoulders relaxed, and avoid rounded back. Keep hips level with knees. Avoid crossing legs for long periods.   Flexibility: Corner Stretch   Standing in corner or a doorway with hands just above shoulder level.  Lean forward until a comfortable stretch is felt across chest. Hold __30__ seconds. Repeat __3__ times per set.  Do _2___ sessions per day.  http://orth.exer.us/342   Copyright  VHI. All rights reserved.   Madelyn Flavors, PT 07/12/2015 9:41 AM [facility] [address] [place], [place], [address] Phone: [phone]   Fax:  [phone]

## 2015-07-12 NOTE — Therapy (Signed)
[facility] [place], [place], [address] Phone: [phone]   Fax:  [phone]  Physical Therapy Evaluation  Patient Details  Name: Kellie [name] MRN: [id number] Date of Birth: 05/02/1956 Referring Provider: [name] PA-C  Encounter Date: 07/12/2015      PT End of Session - 07/12/15 0905    Visit Number 1   Number of Visits 12   Date for PT Re-Evaluation 08/23/15   PT Start Time 0909   PT Stop Time 0943   PT Time Calculation (min) 34 min   Activity Tolerance Patient tolerated treatment well   Behavior During Therapy [facility] for tasks assessed/performed      Past Medical History  Diagnosis Date  . Hyperlipidemia   . Allergic rhinitis 11/08/2010  . Osteoporosis 11/21/2011  . Asthma   . Bronchitis   . Anxiety   . Mitral valve prolapse   . Herpes simplex of female genitalia   . Abnormal Pap smear of cervix   . Urticaria     Past Surgical History  Procedure Laterality Date  . Leep  11/2004  . Closed manipulation shoulder  7&01/2005    x2, 30 days apart  . Esophagogastroduodenoscopy  09/2012    Normal (Dr. Ardis Hughs)  . Colonoscopy  09/2012    Medium sized external hemorrhoids, few small divertic in left colon, otherwise normal colon (repeat 10 yrs)  . Closed manipulation shoulder  2006    Left    There were no vitals filed for this visit.  Visit Diagnosis:  Neck pain - Plan: PT plan of care cert/re-cert  Right shoulder pain - Plan: PT plan of care cert/re-cert  Thoracic spine pain - Plan: PT plan of care cert/re-cert  Shoulder stiffness, right - Plan: PT plan of care cert/re-cert  Stiffness of neck - Plan: PT plan of care cert/re-cert  Weakness - Plan: PT plan of care cert/re-cert      Subjective Assessment - 07/12/15 0907    Subjective Patient reports she needs surgery on R shoulder, but doesn't want it.  She had PT last year for her shoulder, but MD says it's freezing up again.  Neck has been hurting  on/off  for one year.    Pertinent History OP. LBP    How long can you stand comfortably? 5-6 min due to low back pain R side   Diagnostic tests xray: 3 mm anterolistheisis C4/5, DDD C5/6    Patient Stated Goals decrease pain increase movement   Currently in Pain? Yes  0/10 right now   Pain Score 10-Worst pain ever   Pain Location Neck   Pain Orientation Posterior;Right;Left   Pain Descriptors / Indicators Tightness   Pain Type Chronic pain   Pain Radiating Towards suboccipital   Pain Onset More than a month ago   Pain Frequency Intermittent   Aggravating Factors  late afternoon and at night when sleeping   Pain Relieving Factors meds   Effect of Pain on Daily Activities limited   Multiple Pain Sites Yes   Pain Score 8   Pain Location Shoulder   Pain Orientation Right   Pain Descriptors / Indicators Sharp   Pain Type Chronic pain   Pain Onset More than a month ago   Pain Frequency Intermittent   Aggravating Factors  lifting such as water   Pain Relieving Factors meds, rest   Effect of Pain on Daily Activities limited            [facility] PT  Assessment - 07/12/15 0001    Assessment   Medical Diagnosis Cervical DDD; Adhesive capsulitis R shoulder   Referring Provider [name] PA-C   Onset Date/Surgical Date 09/09/14   Hand Dominance Right   Next MD Visit not scheduled   Prior Therapy for shoudler 1 year ago   Precautions   Precautions --   Precaution Comments anterolisthesis C4/5   Balance Screen   Has the patient fallen in the past 6 months No   Has the patient had a decrease in activity level because of a fear of falling?  Yes   Is the patient reluctant to leave their home because of a fear of falling?  No   Prior Function   Level of Independence Independent   Vocation Retired   Mining engineer Comments R thoracic curve corrected with lumbar flexion; B winging, rounded shoulders and forward head, elevated R shoulder; even pelvis   ROM /  Strength   AROM / PROM / Strength AROM;Strength   AROM   AROM Assessment Site Shoulder;Cervical   Right/Left Shoulder Right   Right Shoulder Extension --  WNL   Right Shoulder Flexion 140 Degrees  155 passive   Right Shoulder ABduction --  WNL   Right Shoulder Internal Rotation --  thumb to .5 inch from inf angle of scapula   Right Shoulder External Rotation 71 Degrees   Cervical Flexion WNL   Cervical Extension WNL   Cervical - Right Side Bend 19   Cervical - Left Side Bend 30   Cervical - Right Rotation 71   Cervical - Left Rotation 69   Strength   Overall Strength Comments R shoulder flex, ext, IR 4+/5, ABD, ER 4-/5; mid/low traps 3+/5 B   Palpation   Palpation comment marked tenderness of cervical paraspinals, R thoracic and lumbar paraspinals, R pecs and supraspinatus insertion, R levator scap and B UT   Special Tests    Special Tests Cervical   Cervical Tests Dictraction   Distraction Test   Findngs Negative   Comment negative compression                           PT Education - 07/12/15 1043    Education provided Yes   Education Details HEP   Person(s) Educated Patient   Methods Explanation;Demonstration;Handout   Comprehension Verbalized understanding;Returned demonstration             PT Long Term Goals - 07/12/15 1220    PT LONG TERM GOAL #1   Title I with HEP   Time 6   Period Weeks   Status New   PT LONG TERM GOAL #2   Title decreased neck and shoulder pain by 50% or more with ADLS   Time 6   Period Weeks   Status New   PT LONG TERM GOAL #3   Title Improved R shoulder strength to 4+ to 5/5 to improve function   Time 6   Period Weeks   Status New   PT LONG TERM GOAL #4   Title improved mid/low trap strength to 4/5 or better to improve posture.   Time 6   Period Weeks   Status New               Plan - 07/12/15 1043    Clinical Impression Statement Patient presents with intermittent neck and R shoulder pain as  well as thoracic and LBP. She has postural deficits  that may be contributing to neck and shoulder pain as well.   Pt will benefit from skilled therapeutic intervention in order to improve on the following deficits Decreased range of motion;Decreased activity tolerance;Pain;Impaired flexibility;Postural dysfunction;Decreased strength   Rehab Potential Excellent   PT Frequency 2x / week   PT Duration 6 weeks   PT Treatment/Interventions ADLs/Self Care Home Management;Electrical Stimulation;Moist Heat;Therapeutic exercise;Ultrasound;Neuromuscular re-education;Patient/family education;Manual techniques;Dry needling;Passive range of motion   PT Next Visit Plan Upper back strengthening, scapular and cervical stabilization, R shoulder strengthening, STW/man to neck/shoulder/thoracic spine; modalities prn.   PT Home Exercise Plan cerv tension stretches, doorway stretch, scap retraction   Consulted and Agree with Plan of Care Patient         Problem List Patient Active Problem List   Diagnosis Date Noted  . Radiculitis of right cervical region 07/06/2015  . Adhesive capsulitis 07/05/2015  . DDD (degenerative disc disease), cervical 07/05/2015  . Piriformis syndrome 03/01/2015  . Chronic nasal congestion 03/01/2015  . Vitreous floaters of right eye 03/01/2015  . Sacroiliac joint dysfunction of right side 02/25/2015  . Near syncope 02/09/2015  . Sinusitis 02/09/2015  . Rhinitis, allergic 10/14/2014  . Family history of renal cancer 10/11/2014  . DDD (degenerative disc disease), lumbar 10/11/2014  . Bipolar disorder with depression ([place]) 06/02/2014  . Postmenopausal atrophic vaginitis 03/17/2014  . Prediabetes 12/14/2013  . Adhesive capsulitis of right shoulder 11/24/2013  . Chronic periodontal disease 06/30/2013  . Genital herpes 11/05/2012  . Hemorrhoids 11/05/2012  . Borderline intellectual functioning 02/15/2012  . Osteoporosis 11/21/2011  . Allergic rhinitis 11/08/2010  . Insomnia  11/08/2010  . GERD (gastroesophageal reflux disease) 09/27/2010   Madelyn Flavors PT  07/12/2015, 12:31 PM  [facility] [address] [place], [place], [address] Phone: [phone]   Fax:  [phone]  Name: Kellie [name] MRN: [id number] Date of Birth: 08/28/1955

## 2015-07-13 ENCOUNTER — Ambulatory Visit (INDEPENDENT_AMBULATORY_CARE_PROVIDER_SITE_OTHER): Payer: No Typology Code available for payment source

## 2015-07-13 DIAGNOSIS — M7501 Adhesive capsulitis of right shoulder: Secondary | ICD-10-CM

## 2015-07-13 NOTE — Telephone Encounter (Signed)
Pt notified & is ok with an xray.  Right shoulder xray ordered.

## 2015-07-19 ENCOUNTER — Ambulatory Visit: Payer: No Typology Code available for payment source | Admitting: Physical Therapy

## 2015-07-19 DIAGNOSIS — M25511 Pain in right shoulder: Secondary | ICD-10-CM

## 2015-07-19 DIAGNOSIS — R531 Weakness: Secondary | ICD-10-CM

## 2015-07-19 DIAGNOSIS — M25611 Stiffness of right shoulder, not elsewhere classified: Secondary | ICD-10-CM

## 2015-07-19 DIAGNOSIS — M542 Cervicalgia: Secondary | ICD-10-CM

## 2015-07-19 NOTE — Therapy (Signed)
[facility] [place], [place], [address] Phone: [phone]   Fax:  [phone]  Physical Therapy Treatment  Patient Details  Name: Kellie [name] MRN: [id number] Date of Birth: 03-Nov-1955 Referring Provider: [name] PA-C  Encounter Date: 07/19/2015      PT End of Session - 07/19/15 0816    Visit Number 2   Number of Visits 12   Date for PT Re-Evaluation 08/23/15   PT Start Time 0817   PT Stop Time 0902   PT Time Calculation (min) 45 min   Activity Tolerance Patient tolerated treatment well      Past Medical History  Diagnosis Date  . Hyperlipidemia   . Allergic rhinitis 11/08/2010  . Osteoporosis 11/21/2011  . Asthma   . Bronchitis   . Anxiety   . Mitral valve prolapse   . Herpes simplex of female genitalia   . Abnormal Pap smear of cervix   . Urticaria     Past Surgical History  Procedure Laterality Date  . Leep  11/2004  . Closed manipulation shoulder  7&01/2005    x2, 30 days apart  . Esophagogastroduodenoscopy  09/2012    Normal (Dr. Ardis Hughs)  . Colonoscopy  09/2012    Medium sized external hemorrhoids, few small divertic in left colon, otherwise normal colon (repeat 10 yrs)  . Closed manipulation shoulder  2006    Left    There were no vitals filed for this visit.  Visit Diagnosis:  Neck pain  Right shoulder pain  Shoulder stiffness, right  Weakness      Subjective Assessment - 07/19/15 0817    Subjective Patient states she tried the towel in the pillow but noticed no difference. She is compliant with HEP.   How long can you stand comfortably? 5-6 min due to low back pain R side   Diagnostic tests xray: 3 mm anterolistheisis C4/5, DDD C5/6    Patient Stated Goals decrease pain increase movement   Currently in Pain? Yes   Pain Score 8    Pain Location Neck   Pain Orientation Right;Left;Posterior   Pain Descriptors / Indicators Tightness   Pain Type Chronic pain   Pain Radiating  Towards suboccipital   Pain Onset More than a month ago   Pain Frequency Intermittent   Aggravating Factors  late afternoon and night when sleeping   Pain Relieving Factors meds   Effect of Pain on Daily Activities limited   Pain Score 8   Pain Location Shoulder   Pain Orientation Right   Pain Descriptors / Indicators Sharp   Pain Type Chronic pain   Pain Radiating Towards arm and hand   Pain Onset More than a month ago                         [facility] Adult PT Treatment/Exercise - 07/19/15 0001    Exercises   Exercises Shoulder;Neck   Neck Exercises: Theraband   Shoulder Extension 20 reps  pink   Rows 20 reps  pink xts   Shoulder Exercises: Supine   Protraction Strengthening;Both;20 reps   Protraction Weight (lbs) 2   Shoulder Exercises: Prone   Horizontal ABduction 1 Strengthening;Right;20 reps;Weights   Horizontal ABduction 1 Weight (lbs) 1   Horizontal ABduction 2 Strengthening;Right;20 reps;Weights   Horizontal ABduction 2 Weight (lbs) 1   Shoulder Exercises: Standing   External Rotation Strengthening;Right;20 reps;Theraband   Theraband Level (Shoulder External Rotation) Level 2 (Red)  Flexion Strengthening;Right;20 reps;Weights  Also scaption x 20   Shoulder Flexion Weight (lbs) 2   Shoulder Exercises: Stretch   Corner Stretch 1 rep;60 seconds   Internal Rotation Stretch 30 seconds  sleeper stretch   Other Shoulder Stretches attempted towel stretch for IR but patient didn't feel.   Manual Therapy   Manual Therapy Passive ROM;Soft tissue mobilization   Soft tissue mobilization to R pectorals   Passive ROM for R shoulder IR                PT Education - 07/19/15 1255    Education provided Yes   Education Details HEP   Person(s) Educated Patient   Methods Explanation;Demonstration;Handout   Comprehension Verbalized understanding;Returned demonstration             PT Long Term Goals - 07/12/15 1220    PT LONG TERM GOAL #1    Title I with HEP   Time 6   Period Weeks   Status New   PT LONG TERM GOAL #2   Title decreased neck and shoulder pain by 50% or more with ADLS   Time 6   Period Weeks   Status New   PT LONG TERM GOAL #3   Title Improved R shoulder strength to 4+ to 5/5 to improve function   Time 6   Period Weeks   Status New   PT LONG TERM GOAL #4   Title improved mid/low trap strength to 4/5 or better to improve posture.   Time 6   Period Weeks   Status New               Plan - 07/19/15 1255    Clinical Impression Statement Patient did very well with treatment today with no increased pain after TE. Goals are ongoing as only second visit.   Pt will benefit from skilled therapeutic intervention in order to improve on the following deficits Decreased range of motion;Decreased activity tolerance;Pain;Impaired flexibility;Postural dysfunction;Decreased strength   Rehab Potential Excellent   PT Frequency 2x / week   PT Duration 6 weeks   PT Treatment/Interventions ADLs/Self Care Home Management;Electrical Stimulation;Moist Heat;Therapeutic exercise;Ultrasound;Neuromuscular re-education;Patient/family education;Manual techniques;Dry needling;Passive range of motion   PT Next Visit Plan Upper back strengthening, scapular and cervical stabilization, R shoulder strengthening, STW/man to neck/shoulder/thoracic spine; modalities prn.   PT Home Exercise Plan IR sleeper stretch, doorway stretch   Consulted and Agree with Plan of Care Patient        Problem List Patient Active Problem List   Diagnosis Date Noted  . Radiculitis of right cervical region 07/06/2015  . Adhesive capsulitis 07/05/2015  . DDD (degenerative disc disease), cervical 07/05/2015  . Piriformis syndrome 03/01/2015  . Chronic nasal congestion 03/01/2015  . Vitreous floaters of right eye 03/01/2015  . Sacroiliac joint dysfunction of right side 02/25/2015  . Near syncope 02/09/2015  . Sinusitis 02/09/2015  . Rhinitis,  allergic 10/14/2014  . Family history of renal cancer 10/11/2014  . DDD (degenerative disc disease), lumbar 10/11/2014  . Bipolar disorder with depression ([place]) 06/02/2014  . Postmenopausal atrophic vaginitis 03/17/2014  . Prediabetes 12/14/2013  . Adhesive capsulitis of right shoulder 11/24/2013  . Chronic periodontal disease 06/30/2013  . Genital herpes 11/05/2012  . Hemorrhoids 11/05/2012  . Borderline intellectual functioning 02/15/2012  . Osteoporosis 11/21/2011  . Allergic rhinitis 11/08/2010  . Insomnia 11/08/2010  . GERD (gastroesophageal reflux disease) 09/27/2010    Madelyn Flavors PT  07/19/2015, 12:57 PM  [facility]  [place], [place], [address] Phone: [phone]   Fax:  [phone]  Name: DESHAWNA MELNYK MRN: [id number] Date of Birth: 09/25/1955

## 2015-07-21 ENCOUNTER — Encounter: Payer: Self-pay | Admitting: Family Medicine

## 2015-07-21 ENCOUNTER — Encounter: Payer: Self-pay | Admitting: Physical Therapy

## 2015-07-21 ENCOUNTER — Ambulatory Visit (INDEPENDENT_AMBULATORY_CARE_PROVIDER_SITE_OTHER): Payer: No Typology Code available for payment source | Admitting: Family Medicine

## 2015-07-21 VITALS — BP 99/67 | HR 86 | Wt 116.0 lb

## 2015-07-21 DIAGNOSIS — R0982 Postnasal drip: Secondary | ICD-10-CM | POA: Insufficient documentation

## 2015-07-21 DIAGNOSIS — R05 Cough: Secondary | ICD-10-CM | POA: Insufficient documentation

## 2015-07-21 DIAGNOSIS — R053 Chronic cough: Secondary | ICD-10-CM | POA: Insufficient documentation

## 2015-07-21 DIAGNOSIS — R059 Cough, unspecified: Secondary | ICD-10-CM

## 2015-07-21 MED ORDER — IPRATROPIUM BROMIDE 0.06 % NA SOLN: 2.0000 | Freq: Four times a day (QID) | NASAL | Status: DC

## 2015-07-21 NOTE — Assessment & Plan Note (Signed)
Patient cannot tolerate Qvar. We'll try to give her information for the patient assistance program for the

## 2015-07-21 NOTE — Patient Instructions (Signed)
Thank you for coming in today. Use the nasal spray.  Use albuterol.  Continue the over the counter medicines.  Call or go to the emergency room if you get worse, have trouble breathing, have chest pains, or palpitations.   We will try to get you the information for the patient assistance program.   Upper Respiratory Infection, Adult Most upper respiratory infections (URIs) are a viral infection of the air passages leading to the lungs. A URI affects the nose, throat, and upper air passages. The most common type of URI is nasopharyngitis and is typically referred to as "the common cold." URIs run their course and usually go away on their own. Most of the time, a URI does not require medical attention, but sometimes a bacterial infection in the upper airways can follow a viral infection. This is called a secondary infection. Sinus and middle ear infections are common types of secondary upper respiratory infections. Bacterial pneumonia can also complicate a URI. A URI can worsen asthma and chronic obstructive pulmonary disease (COPD). Sometimes, these complications can require emergency medical care and may be life threatening.  CAUSES Almost all URIs are caused by viruses. A virus is a type of germ and can spread from one person to another.  RISKS FACTORS You may be at risk for a URI if:   You smoke.   You have chronic heart or lung disease.  You have a weakened defense (immune) system.   You are very young or very old.   You have nasal allergies or asthma.  You work in crowded or poorly ventilated areas.  You work in health care facilities or schools. SIGNS AND SYMPTOMS  Symptoms typically develop 2-3 days after you come in contact with a cold virus. Most viral URIs last 7-10 days. However, viral URIs from the influenza virus (flu virus) can last 14-18 days and are typically more severe. Symptoms may include:   Runny or stuffy (congested) nose.   Sneezing.   Cough.   Sore  throat.   Headache.   Fatigue.   Fever.   Loss of appetite.   Pain in your forehead, behind your eyes, and over your cheekbones (sinus pain).  Muscle aches.  DIAGNOSIS  Your health care provider may diagnose a URI by:  Physical exam.  Tests to check that your symptoms are not due to another condition such as:  Strep throat.  Sinusitis.  Pneumonia.  Asthma. TREATMENT  A URI goes away on its own with time. It cannot be cured with medicines, but medicines may be prescribed or recommended to relieve symptoms. Medicines may help:  Reduce your fever.  Reduce your cough.  Relieve nasal congestion. HOME CARE INSTRUCTIONS   Take medicines only as directed by your health care provider.   Gargle warm saltwater or take cough drops to comfort your throat as directed by your health care provider.  Use a warm mist humidifier or inhale steam from a shower to increase air moisture. This may make it easier to breathe.  Drink enough fluid to keep your urine clear or pale yellow.   Eat soups and other clear broths and maintain good nutrition.   Rest as needed.   Return to work when your temperature has returned to normal or as your health care provider advises. You may need to stay home longer to avoid infecting others. You can also use a face mask and careful hand washing to prevent spread of the virus.  Increase the usage of your inhaler  if you have asthma.   Do not use any tobacco products, including cigarettes, chewing tobacco, or electronic cigarettes. If you need help quitting, ask your health care provider. PREVENTION  The best way to protect yourself from getting a cold is to practice good hygiene.   Avoid oral or hand contact with people with cold symptoms.   Wash your hands often if contact occurs.  There is no clear evidence that vitamin C, vitamin E, echinacea, or exercise reduces the chance of developing a cold. However, it is always recommended to  get plenty of rest, exercise, and practice good nutrition.  SEEK MEDICAL CARE IF:   You are getting worse rather than better.   Your symptoms are not controlled by medicine.   You have chills.  You have worsening shortness of breath.  You have brown or red mucus.  You have yellow or brown nasal discharge.  You have pain in your face, especially when you bend forward.  You have a fever.  You have swollen neck glands.  You have pain while swallowing.  You have white areas in the back of your throat. SEEK IMMEDIATE MEDICAL CARE IF:   You have severe or persistent:  Headache.  Ear pain.  Sinus pain.  Chest pain.  You have chronic lung disease and any of the following:  Wheezing.  Prolonged cough.  Coughing up blood.  A change in your usual mucus.  You have a stiff neck.  You have changes in your:  Vision.  Hearing.  Thinking.  Mood. MAKE SURE YOU:   Understand these instructions.  Will watch your condition.  Will get help right away if you are not doing well or get worse.   This information is not intended to replace advice given to you by your health care provider. Make sure you discuss any questions you have with your health care provider.   Document Released: 12/05/2000 Document Revised: 10/26/2014 Document Reviewed: 09/16/2013 Elsevier Interactive Patient Education [employer].

## 2015-07-21 NOTE — Assessment & Plan Note (Signed)
Treatment with Atrovent nasal spray and over-the-counter medicines. Follow-up if not improving.

## 2015-07-21 NOTE — Progress Notes (Signed)
Kellie [name] is a 60 y.o. female who presents to [facility] today for cough and congestion. Patient notes a 5 day history of cough. She notes the cough is productive occasionally to clear sputum. She notes her throat feels somewhat irritated and treat ears feel mildly itchy. She denies significant wheezing or shortness of breath. She's tried taking some leftover amoxicillin, Alka-Seltzer, and over-the-counter medicines which have been not very helpful. She denies significant shortness of breath fevers or chills. She notes this happens recurrently. She notes she has not been taking her Qvar because it causes throat irritation.   Past Medical History  Diagnosis Date  . Hyperlipidemia   . Allergic rhinitis 11/08/2010  . Osteoporosis 11/21/2011  . Asthma   . Bronchitis   . Anxiety   . Mitral valve prolapse   . Herpes simplex of female genitalia   . Abnormal Pap smear of cervix   . Urticaria    Past Surgical History  Procedure Laterality Date  . Leep  11/2004  . Closed manipulation shoulder  7&01/2005    x2, 30 days apart  . Esophagogastroduodenoscopy  09/2012    Normal (Dr. Ardis Hughs)  . Colonoscopy  09/2012    Medium sized external hemorrhoids, few small divertic in left colon, otherwise normal colon (repeat 10 yrs)  . Closed manipulation shoulder  2006    Left   Social History  Substance Use Topics  . Smoking status: Never Smoker   . Smokeless tobacco: Never Used  . Alcohol Use: No   family history includes Allergic rhinitis in her father and mother; Asthma in her mother; COPD in her mother and sister; Cancer in her mother; Depression in her maternal aunt, maternal grandfather, and sister; Heart attack in her brother; Heart disease in her brother, father, and paternal uncle; Kidney cancer (age of onset: 68) in her mother; Kidney disease in her mother. There is no history of Colon  cancer, Rectal cancer, Stomach cancer, or Colon polyps.  ROS as above Medications: Current Outpatient Prescriptions  Medication Sig Dispense Refill  . acyclovir (ZOVIRAX) 200 MG capsule Take 2 capsules (400 mg total) by mouth 2 (two) times daily. 60 capsule 5  . Albuterol Sulfate (VENTOLIN HFA IN) Inhale into the lungs.    . ALPRAZolam (XANAX) 0.5 MG tablet Take 1 tablet (0.5 mg total) by mouth as needed for anxiety. 30 tablet 0  . baclofen (LIORESAL) 10 MG tablet Take 1 tablet (10 mg total) by mouth at bedtime. 30 each 1  . budesonide (RHINOCORT AQUA) 32 MCG/ACT nasal spray Use 1-2 sprays in each nostril once daily for stuffy nose or drainage. 1 Bottle 5  . famotidine (PEPCID) 10 MG tablet Take 10 mg by mouth 2 (two) times daily.    . fluticasone (FLONASE) 50 MCG/ACT nasal spray One spray in each nostril twice a day, use left hand for right nostril, and right hand for left nostril. 48 g 3  . hydrOXYzine (VISTARIL) 25 MG capsule Take 1 capsule (25 mg total) by mouth daily. 30 capsule 1  . ibuprofen (ADVIL,MOTRIN) 800 MG tablet Take 1 tablet (800 mg total) by mouth every 8 (eight) hours as needed for mild pain. 30 tablet 0  . lamoTRIgine (LAMICTAL) 100 MG tablet Take 1 tablet (100 mg total) by mouth 2 (two) times daily. 60 tablet 1  . loratadine (CLARITIN) 10 MG tablet Take 1 tablet (10 mg total) by mouth daily. 90 tablet 3  .  Melatonin 5 MG CAPS Take by mouth.    . meloxicam (MOBIC) 15 MG tablet Take 1 tablet (15 mg total) by mouth daily. For 2weeks then as needed for neck pain. 30 tablet 0  . Probiotic Product (PROBIOTIC & ACIDOPHILUS EX ST PO) Take by mouth.    . traZODone (DESYREL) 50 MG tablet Take 1 tablet (50 mg total) by mouth at bedtime. 30 tablet 1  . ipratropium (ATROVENT) 0.06 % nasal spray Place 2 sprays into both nostrils 4 (four) times daily. 15 mL 1   No current facility-administered medications for this visit.   Allergies  Allergen Reactions  . Hydrocodone Nausea And  Vomiting  . Codeine Nausea And Vomiting  . Morphine And Related Nausea And Vomiting  . Percocet [Oxycodone-Acetaminophen] Nausea And Vomiting and Other (See Comments)    Patient states it makes blood pressure drop too much  . Vicodin [Hydrocodone-Acetaminophen] Nausea And Vomiting     Exam:  BP 99/67 mmHg  Pulse 86  Wt 116 lb (52.617 kg) Gen: Well NAD HEENT: EOMI,  MMM posterior pharynx with mild cobblestoning. Normal tympanic membranes bilaterally. Clear nasal discharge is present. No significant cervical lymphadenopathy is present. Lungs: Normal work of breathing. CTABL Heart: RRR no MRG Abd: NABS, Soft. Nondistended, Nontender Exts: Brisk capillary refill, warm and well perfused.   No results found for this or any previous visit (from the past 24 hour(s)). No results found.   Please see individual assessment and plan sections.

## 2015-07-26 ENCOUNTER — Ambulatory Visit: Payer: Self-pay | Admitting: Physical Therapy

## 2015-08-01 ENCOUNTER — Encounter: Payer: Self-pay | Admitting: Physical Therapy

## 2015-08-01 ENCOUNTER — Ambulatory Visit (INDEPENDENT_AMBULATORY_CARE_PROVIDER_SITE_OTHER): Payer: No Typology Code available for payment source | Admitting: Physical Therapy

## 2015-08-01 DIAGNOSIS — R531 Weakness: Secondary | ICD-10-CM

## 2015-08-01 DIAGNOSIS — M25511 Pain in right shoulder: Secondary | ICD-10-CM

## 2015-08-01 DIAGNOSIS — M542 Cervicalgia: Secondary | ICD-10-CM

## 2015-08-01 DIAGNOSIS — M25611 Stiffness of right shoulder, not elsewhere classified: Secondary | ICD-10-CM

## 2015-08-01 NOTE — Therapy (Signed)
Kellie [name] [facility] [place], [place], [address] Phone: [phone]   Fax:  [phone]  Physical Therapy Treatment  Patient Details  Name: Kellie [name] MRN: [id number] Date of Birth: Dec 07, 1955 Referring Provider: [name]  Encounter Date: 08/01/2015      PT End of Session - 08/01/15 1003    Visit Number 3   Number of Visits 12   Date for PT Re-Evaluation 08/23/15   PT Start Time 1003   PT Stop Time 1054   PT Time Calculation (min) 51 min   Activity Tolerance Patient tolerated treatment well      Past Medical History  Diagnosis Date  . Hyperlipidemia   . Allergic rhinitis 11/08/2010  . Osteoporosis 11/21/2011  . Asthma   . Bronchitis   . Anxiety   . Mitral valve prolapse   . Herpes simplex of female genitalia   . Abnormal Pap smear of cervix   . Urticaria     Past Surgical History  Procedure Laterality Date  . Leep  11/2004  . Closed manipulation shoulder  7&01/2005    x2, 30 days apart  . Esophagogastroduodenoscopy  09/2012    Normal (Dr. Ardis Hughs)  . Colonoscopy  09/2012    Medium sized external hemorrhoids, few small divertic in left colon, otherwise normal colon (repeat 10 yrs)  . Closed manipulation shoulder  2006    Left    There were no vitals filed for this visit.  Visit Diagnosis:  Neck pain  Shoulder stiffness, right  Weakness  Right shoulder pain      Subjective Assessment - 08/01/15 1003    Subjective Kellie [name] has been doing her arm exercises, tried to sleep on one pillow and was unable to sleep this way.    Currently in Pain? No/denies  has pain in later in the evenings and with sleeping on the right side            [facility] PT Assessment - 08/01/15 0001    Assessment   Medical Diagnosis Cervical DDD; Adhesive capsulitis R shoulder   Referring Provider [name]   Onset Date/Surgical Date 09/09/14   Hand Dominance Right   Prior Therapy for shoudler 1 year ago    Precautions   Precaution Comments anterolisthesis C4/5   AROM   AROM Assessment Site Shoulder   Right Shoulder Flexion 150 Degrees   Right Shoulder Internal Rotation 80 Degrees  86 after mobs   Right Shoulder External Rotation 45 Degrees  62 after mobs                     [facility] Adult PT Treatment/Exercise - 08/01/15 0001    Exercises   Exercises Shoulder;Elbow   Elbow Exercises   Other elbow exercises 2x10 reps, red band, supine scap HEP    Neck Exercises: Supine   Cervical Isometrics 20 reps  head press into pillow   Shoulder Exercises: ROM/Strengthening   UBE (Upper Arm Bike) L2x4' alt FWD/BWD   Shoulder Exercises: Stretch   Other Shoulder Stretches doorway stretch x 30sec low, mid, high.    Manual Therapy   Manual Therapy Joint mobilization   Joint Mobilization Rt shoulder ER/IR grade III-II                PT Education - 08/01/15 1013    Education provided Yes   Education Details HEP   Person(s) Educated Patient   Methods Explanation;Demonstration;Handout   Comprehension Returned demonstration;Verbalized understanding  PT Long Term Goals - 08/01/15 1030    PT LONG TERM GOAL #1   Title I with HEP   Status On-going   PT LONG TERM GOAL #2   Title decreased neck and shoulder pain by 50% or more with ADLS   Status On-going   PT LONG TERM GOAL #3   Title Improved R shoulder strength to 4+ to 5/5 to improve function   Status On-going   PT LONG TERM GOAL #4   Title improved mid/low trap strength to 4/5 or better to improve posture.   Status On-going               Plan - 08/01/15 1055    Clinical Impression Statement Pt with improved ROM in her shoulder today.  Pain is limited and not constant.  Progressing to goals, will be changing her treatment office to this one and come atleast once a week.  Will try for 2 however transportation may limit    Pt will benefit from skilled therapeutic intervention in order to improve on  the following deficits Decreased range of motion;Decreased activity tolerance;Pain;Impaired flexibility;Postural dysfunction;Decreased strength   Rehab Potential Excellent   PT Frequency 2x / week   PT Duration 6 weeks   PT Treatment/Interventions ADLs/Self Care Home Management;Electrical Stimulation;Moist Heat;Therapeutic exercise;Ultrasound;Neuromuscular re-education;Patient/family education;Manual techniques;Dry needling;Passive range of motion   PT Next Visit Plan Upper back strengthening, scapular and cervical stabilization, R shoulder strengthening, STW/man to neck/shoulder/thoracic spine; modalities prn.   Consulted and Agree with Plan of Care Patient        Problem List Patient Active Problem List   Diagnosis Date Noted  . Cough 07/21/2015  . Post-nasal drip 07/21/2015  . Radiculitis of right cervical region 07/06/2015  . Adhesive capsulitis 07/05/2015  . DDD (degenerative disc disease), cervical 07/05/2015  . Piriformis syndrome 03/01/2015  . Chronic nasal congestion 03/01/2015  . Vitreous floaters of right eye 03/01/2015  . Sacroiliac joint dysfunction of right side 02/25/2015  . Near syncope 02/09/2015  . Sinusitis 02/09/2015  . Rhinitis, allergic 10/14/2014  . Family history of renal cancer 10/11/2014  . DDD (degenerative disc disease), lumbar 10/11/2014  . Bipolar disorder with depression ([place]) 06/02/2014  . Postmenopausal atrophic vaginitis 03/17/2014  . Prediabetes 12/14/2013  . Adhesive capsulitis of right shoulder 11/24/2013  . Chronic periodontal disease 06/30/2013  . Genital herpes 11/05/2012  . Hemorrhoids 11/05/2012  . Borderline intellectual functioning 02/15/2012  . Osteoporosis 11/21/2011  . Allergic rhinitis 11/08/2010  . Insomnia 11/08/2010  . GERD (gastroesophageal reflux disease) 09/27/2010    Jeral Pinch PT  08/01/2015, 10:56 AM  [facility] [place], [place],  [address] Phone: [phone]   Fax:  [phone]  Name: Kellie [name] MRN: [id number] Date of Birth: 04-06-1956

## 2015-08-01 NOTE — Patient Instructions (Signed)
Over Head Pull: Narrow Grip     [place] [phone]   On back, knees bent, feet flat, band across thighs, elbows straight but relaxed. Pull hands apart (start). Keeping elbows straight, bring arms up and over head, hands toward floor. Keep pull steady on band. Hold momentarily. Return slowly, keeping pull steady, back to start. Repeat _2-3x10__ times. Band color _red_____   Side Pull: Double Arm   On back, knees bent, feet flat. Arms perpendicular to body, shoulder level, elbows straight but relaxed. Pull arms out to sides, elbows straight. Resistance band comes across collarbones, hands toward floor. Hold momentarily. Slowly return to starting position. Repeat _2-3x10__ times. Band color _red____   Elmer Picker   On back, knees bent, feet flat, left hand on left hip, right hand above left. Pull right arm DIAGONALLY (hip to shoulder) across chest. Bring right arm along head toward floor. Hold momentarily. Slowly return to starting position. Repeat _2-3x10__ times. Do with left arm. Band color __red__   Shoulder Rotation: Double Arm   On back, knees bent, feet flat, elbows tucked at sides, bent 90, hands palms up. Pull hands apart and down toward floor, keeping elbows near sides. Hold momentarily. Slowly return to starting position. Repeat _2-3x10__ times. Band color __red____

## 2015-08-09 ENCOUNTER — Ambulatory Visit (INDEPENDENT_AMBULATORY_CARE_PROVIDER_SITE_OTHER): Payer: No Typology Code available for payment source | Admitting: Physical Therapy

## 2015-08-09 ENCOUNTER — Encounter: Payer: Self-pay | Admitting: Physical Therapy

## 2015-08-09 DIAGNOSIS — R531 Weakness: Secondary | ICD-10-CM

## 2015-08-09 DIAGNOSIS — M25611 Stiffness of right shoulder, not elsewhere classified: Secondary | ICD-10-CM

## 2015-08-09 DIAGNOSIS — M542 Cervicalgia: Secondary | ICD-10-CM

## 2015-08-09 DIAGNOSIS — M25511 Pain in right shoulder: Secondary | ICD-10-CM

## 2015-08-09 NOTE — Therapy (Signed)
[place], [place], [address] Phone: [phone]   Fax:  [phone]  Physical Therapy Treatment  Patient Details  Name: Kellie [name] MRN: [id number] Date of Birth: 1956/06/09 Referring Provider: [name]  Encounter Date: 08/09/2015      PT End of Session - 08/09/15 1007    Visit Number 4   Number of Visits 12   Date for PT Re-Evaluation 08/23/15   PT Start Time 0935   PT Stop Time 1007   PT Time Calculation (min) 32 min   Activity Tolerance Patient limited by pain  in her low back, she is going to [facility] to see if she can get an order for this.       Past Medical History  Diagnosis Date  . Hyperlipidemia   . Allergic rhinitis 11/08/2010  . Osteoporosis 11/21/2011  . Asthma   . Bronchitis   . Anxiety   . Mitral valve prolapse   . Herpes simplex of female genitalia   . Abnormal Pap smear of cervix   . Urticaria     Past Surgical History  Procedure Laterality Date  . Leep  11/2004  . Closed manipulation shoulder  7&01/2005    x2, 30 days apart  . Esophagogastroduodenoscopy  09/2012    Normal (Dr. Ardis Hughs)  . Colonoscopy  09/2012    Medium sized external hemorrhoids, few small divertic in left colon, otherwise normal colon (repeat 10 yrs)  . Closed manipulation shoulder  2006    Left    There were no vitals filed for this visit.  Visit Diagnosis:  Neck pain  Shoulder stiffness, right  Weakness  Right shoulder pain      Subjective Assessment - 08/09/15 0934    Subjective Pt reports she is really sore in her low back and thinks it was from stepping up to high onto a machine   Currently in Pain? No/denies  had pain last week.             [facility] PT Assessment - 08/09/15 0001    Strength   Overall Strength Comments --  mid traps 4-/5                     [facility] Adult PT Treatment/Exercise - 08/09/15 0001    Exercises   Exercises Shoulder   Elbow Exercises    Other elbow exercises plank on wall walking hands up/down   Neck Exercises: Supine   Cervical Isometrics --  15 reps head pressess into ball off EOB   Shoulder Exercises: Supine   Other Supine Exercises stretching out to low back.   Shoulder Exercises: Standing   Other Standing Exercises counter tops push ups   Shoulder Exercises: ROM/Strengthening   Other ROM/Strengthening Exercises rhomboids 3x10 with 1 plate seated row                     PT Long Term Goals - 08/09/15 0941    PT LONG TERM GOAL #1   Title I with HEP   Status On-going   PT LONG TERM GOAL #2   Title decreased neck and shoulder pain by 50% or more with ADLS   Status On-going   PT LONG TERM GOAL #3   Title Improved R shoulder strength to 4+ to 5/5 to improve function   PT LONG TERM GOAL #4   Title improved mid/low trap strength to 4/5 or better to improve posture.  Status On-going               Plan - 08/09/15 1012    Clinical Impression Statement Kellie [name] is still has upper back weakness, she has flared up her low back and this has limited some of her tolerance to activity.  She has been going to the gym. Recommend she start using the rowing machine for upper back strength and stop the seated hip ab/adduction machine.    Pt will benefit from skilled therapeutic intervention in order to improve on the following deficits Decreased range of motion;Decreased activity tolerance;Pain;Impaired flexibility;Postural dysfunction;Decreased strength   PT Frequency 2x / week   PT Duration 6 weeks   PT Treatment/Interventions ADLs/Self Care Home Management;Electrical Stimulation;Moist Heat;Therapeutic exercise;Ultrasound;Neuromuscular re-education;Patient/family education;Manual techniques;Dry needling;Passive range of motion   PT Next Visit Plan eval back if she brings an order.    Consulted and Agree with Plan of Care Patient        Problem List Patient Active Problem List   Diagnosis Date Noted   . Cough 07/21/2015  . Post-nasal drip 07/21/2015  . Radiculitis of right cervical region 07/06/2015  . Adhesive capsulitis 07/05/2015  . DDD (degenerative disc disease), cervical 07/05/2015  . Piriformis syndrome 03/01/2015  . Chronic nasal congestion 03/01/2015  . Vitreous floaters of right eye 03/01/2015  . Sacroiliac joint dysfunction of right side 02/25/2015  . Near syncope 02/09/2015  . Sinusitis 02/09/2015  . Rhinitis, allergic 10/14/2014  . Family history of renal cancer 10/11/2014  . DDD (degenerative disc disease), lumbar 10/11/2014  . Bipolar disorder with depression ([place]) 06/02/2014  . Postmenopausal atrophic vaginitis 03/17/2014  . Prediabetes 12/14/2013  . Adhesive capsulitis of right shoulder 11/24/2013  . Chronic periodontal disease 06/30/2013  . Genital herpes 11/05/2012  . Hemorrhoids 11/05/2012  . Borderline intellectual functioning 02/15/2012  . Osteoporosis 11/21/2011  . Allergic rhinitis 11/08/2010  . Insomnia 11/08/2010  . GERD (gastroesophageal reflux disease) 09/27/2010    Jeral Pinch PT 08/09/2015, 10:16 AM  [facility] [place], [place], [address] Phone: [phone]   Fax:  [phone]  Name: Kellie [name] MRN: [id number] Date of Birth: [birth year]

## 2015-08-15 ENCOUNTER — Ambulatory Visit (INDEPENDENT_AMBULATORY_CARE_PROVIDER_SITE_OTHER): Payer: No Typology Code available for payment source | Admitting: Physical Therapy

## 2015-08-15 ENCOUNTER — Encounter: Payer: Self-pay | Admitting: Physical Therapy

## 2015-08-15 DIAGNOSIS — R531 Weakness: Secondary | ICD-10-CM

## 2015-08-15 DIAGNOSIS — M25611 Stiffness of right shoulder, not elsewhere classified: Secondary | ICD-10-CM

## 2015-08-15 DIAGNOSIS — M542 Cervicalgia: Secondary | ICD-10-CM

## 2015-08-15 DIAGNOSIS — M25511 Pain in right shoulder: Secondary | ICD-10-CM

## 2015-08-15 NOTE — Therapy (Signed)
[place], [place], [address] Phone: [phone]   Fax:  [phone]  Physical Therapy Treatment  Patient Details  Name: Kellie [name] MRN: [id number] Date of Birth: 05/24/1956 Referring Provider: [name]  Encounter Date: 08/15/2015      PT End of Session - 08/15/15 0853    Visit Number 5   Number of Visits 12   Date for PT Re-Evaluation 08/23/15   PT Start Time 0849   PT Stop Time 0938   PT Time Calculation (min) 49 min   Activity Tolerance Patient tolerated treatment well      Past Medical History  Diagnosis Date  . Hyperlipidemia   . Allergic rhinitis 11/08/2010  . Osteoporosis 11/21/2011  . Asthma   . Bronchitis   . Anxiety   . Mitral valve prolapse   . Herpes simplex of female genitalia   . Abnormal Pap smear of cervix   . Urticaria     Past Surgical History  Procedure Laterality Date  . Leep  11/2004  . Closed manipulation shoulder  7&01/2005    x2, 30 days apart  . Esophagogastroduodenoscopy  09/2012    Normal (Dr. Ardis Hughs)  . Colonoscopy  09/2012    Medium sized external hemorrhoids, few small divertic in left colon, otherwise normal colon (repeat 10 yrs)  . Closed manipulation shoulder  2006    Left    There were no vitals filed for this visit.  Visit Diagnosis:  Neck pain  Shoulder stiffness, right  Weakness  Right shoulder pain      Subjective Assessment - 08/15/15 0857    Subjective Pt reports she is doing OK, having neck pain today and Rt shoulder    Currently in Pain? Yes   Pain Score 7    Pain Location Neck   Pain Orientation Right   Pain Descriptors / Indicators Aching   Pain Type Chronic pain   Pain Radiating Towards into Rt shoulder and forearm   Aggravating Factors  went to the store and bought a case of water and lifted it.    Pain Relieving Factors meds                         [facility] Adult PT Treatment/Exercise - 08/15/15  0001    Shoulder Exercises: Standing   External Rotation Strengthening;Both;20 reps;Theraband  leaning on noodle, Rt UE with less motion than Lt. /   Theraband Level (Shoulder External Rotation) Level 2 (Red)   Shoulder Exercises: ROM/Strengthening   UBE (Upper Arm Bike) L2x5' alt FWD/BWD   Shoulder Exercises: Stretch   Other Shoulder Stretches doorway stetch low and mid   Modalities   Modalities Traction;Ultrasound   Ultrasound   Ultrasound Location Rt upper trap   Ultrasound Parameters combo 100%, 3.78mz, 1.5w/cm2   Ultrasound Goals Pain   Traction   Type of Traction Cervical   Min (lbs) 5   Max (lbs) 10   Hold Time 5   Rest Time 5   Time 12                     PT Long Term Goals - 08/15/15 0931    PT LONG TERM GOAL #1   Title I with HEP   Time 6   Period Weeks   Status On-going   PT LONG TERM GOAL #2   Title decreased neck and shoulder pain by 50% or more with ADLS  Time 6   Period Weeks   Status On-going   PT LONG TERM GOAL #3   Title Improved R shoulder strength to 4+ to 5/5 to improve function   Time 6   Period Weeks   Status On-going   PT LONG TERM GOAL #4   Title improved mid/low trap strength to 4/5 or better to improve posture.   Time 6   Period Weeks   Status On-going               Plan - 08/15/15 0932    Clinical Impression Statement Pt reports her pain is a little different today from what it has been, neck and Rt shoulder pain. Her low back is much better today. She hasn't met her goals however is progressing. With her pain into the Rt UE possible coming from the neck and will see if traction.    Pt will benefit from skilled therapeutic intervention in order to improve on the following deficits Decreased range of motion;Decreased activity tolerance;Pain;Impaired flexibility;Postural dysfunction;Decreased strength   Rehab Potential Excellent   PT Frequency 2x / week   PT Duration 6 weeks   PT Treatment/Interventions ADLs/Self  Care Home Management;Electrical Stimulation;Moist Heat;Therapeutic exercise;Ultrasound;Neuromuscular re-education;Patient/family education;Manual techniques;Dry needling;Passive range of motion   PT Next Visit Plan assess response to traction   Consulted and Agree with Plan of Care Patient        Problem List Patient Active Problem List   Diagnosis Date Noted  . Cough 07/21/2015  . Post-nasal drip 07/21/2015  . Radiculitis of right cervical region 07/06/2015  . Adhesive capsulitis 07/05/2015  . DDD (degenerative disc disease), cervical 07/05/2015  . Piriformis syndrome 03/01/2015  . Chronic nasal congestion 03/01/2015  . Vitreous floaters of right eye 03/01/2015  . Sacroiliac joint dysfunction of right side 02/25/2015  . Near syncope 02/09/2015  . Sinusitis 02/09/2015  . Rhinitis, allergic 10/14/2014  . Family history of renal cancer 10/11/2014  . DDD (degenerative disc disease), lumbar 10/11/2014  . Bipolar disorder with depression ([place]) 06/02/2014  . Postmenopausal atrophic vaginitis 03/17/2014  . Prediabetes 12/14/2013  . Adhesive capsulitis of right shoulder 11/24/2013  . Chronic periodontal disease 06/30/2013  . Genital herpes 11/05/2012  . Hemorrhoids 11/05/2012  . Borderline intellectual functioning 02/15/2012  . Osteoporosis 11/21/2011  . Allergic rhinitis 11/08/2010  . Insomnia 11/08/2010  . GERD (gastroesophageal reflux disease) 09/27/2010    Jeral Pinch PT  08/15/2015, 9:35 AM  [facility] [place], [place], [address] Phone: [phone]   Fax:  [phone]  Name: JACIA SICKMAN MRN: [id number] Date of Birth: 1955/12/16

## 2015-08-23 ENCOUNTER — Encounter (HOSPITAL_COMMUNITY): Payer: Self-pay | Admitting: Psychiatry

## 2015-08-23 ENCOUNTER — Ambulatory Visit (INDEPENDENT_AMBULATORY_CARE_PROVIDER_SITE_OTHER): Payer: No Typology Code available for payment source | Admitting: Physical Therapy

## 2015-08-23 ENCOUNTER — Encounter: Payer: Self-pay | Admitting: Physical Therapy

## 2015-08-23 ENCOUNTER — Ambulatory Visit (INDEPENDENT_AMBULATORY_CARE_PROVIDER_SITE_OTHER): Payer: Self-pay | Admitting: Psychiatry

## 2015-08-23 DIAGNOSIS — F419 Anxiety disorder, unspecified: Secondary | ICD-10-CM

## 2015-08-23 DIAGNOSIS — M542 Cervicalgia: Secondary | ICD-10-CM

## 2015-08-23 DIAGNOSIS — R531 Weakness: Secondary | ICD-10-CM

## 2015-08-23 DIAGNOSIS — M25611 Stiffness of right shoulder, not elsewhere classified: Secondary | ICD-10-CM

## 2015-08-23 DIAGNOSIS — M25511 Pain in right shoulder: Secondary | ICD-10-CM

## 2015-08-23 DIAGNOSIS — F310 Bipolar disorder, current episode hypomanic: Secondary | ICD-10-CM

## 2015-08-23 DIAGNOSIS — G4700 Insomnia, unspecified: Secondary | ICD-10-CM

## 2015-08-23 MED ORDER — TRAZODONE HCL 50 MG PO TABS
50.0000 mg | ORAL_TABLET | Freq: Every day | ORAL | Status: DC
Start: 2015-08-23 — End: 2015-11-15

## 2015-08-23 MED ORDER — LAMOTRIGINE 100 MG PO TABS: 100.0000 mg | ORAL_TABLET | Freq: Two times a day (BID) | ORAL | Status: DC

## 2015-08-23 MED ORDER — HYDROXYZINE PAMOATE 25 MG PO CAPS: 25.0000 mg | ORAL_CAPSULE | Freq: Every day | ORAL | Status: DC

## 2015-08-23 NOTE — Progress Notes (Signed)
Patient ID: Kellie [name], female   DOB: [birth year], 60 y.o.   MRN: [id number]   [facility] Follow up Outpatient visit  Kellie [name] [id number] 60 y.o.  08/23/2015 9:02 AM  Chief Complaint:   Follow up for Bipolar  And anxiety.  History of Present Illness:   Patient  Initially presented with racing toughts and cant sleep at night.  Bipolar: lamictal helpful no rash reported. Mood more controlled. Gets lonely at times.   Vistaril helps her anxiety and sleep. She alternates trazadone with vistaril. Sleep: still at timew variable to poor. Takes melatonin or trazadone at night.   Modifying factors; she spiritual she feels connected with God. She talks about her kids. Talks to her sister. Aggravating factor: stress, pain Severity of depression; 6 out of 10. 10 being no depression  Context; feeling lonely, worried about her age and finances Medical complexity: back pain and DDD makes her sleep and mood worse. She is going to physical therapy Denies drug, alcohol abuse or use.  Past Psychiatric History/Hospitalization(s) denies  Hospitalization for psychiatric illness: No History of Electroconvulsive Shock Therapy: No Prior Suicide Attempts: No  Medical History; Past Medical History  Diagnosis Date  . Hyperlipidemia   . Allergic rhinitis 11/08/2010  . Osteoporosis 11/21/2011  . Asthma   . Bronchitis   . Anxiety   . Mitral valve prolapse   . Herpes simplex of female genitalia   . Abnormal Pap smear of cervix   . Urticaria     Allergies: Allergies  Allergen Reactions  . Hydrocodone Nausea And Vomiting  . Codeine Nausea And Vomiting  . Morphine And Related Nausea And Vomiting  . Percocet [Oxycodone-Acetaminophen] Nausea And Vomiting and Other (See Comments)    Patient states it makes blood pressure drop too much  . Vicodin [Hydrocodone-Acetaminophen] Nausea And Vomiting    Medications: Outpatient Encounter Prescriptions as of 08/23/2015  Medication Sig   . acyclovir (ZOVIRAX) 200 MG capsule Take 2 capsules (400 mg total) by mouth 2 (two) times daily.  . Albuterol Sulfate (VENTOLIN HFA IN) Inhale into the lungs.  . ALPRAZolam (XANAX) 0.5 MG tablet Take 1 tablet (0.5 mg total) by mouth as needed for anxiety.  . baclofen (LIORESAL) 10 MG tablet Take 1 tablet (10 mg total) by mouth at bedtime.  . budesonide (RHINOCORT AQUA) 32 MCG/ACT nasal spray Use 1-2 sprays in each nostril once daily for stuffy nose or drainage.  . famotidine (PEPCID) 10 MG tablet Take 10 mg by mouth 2 (two) times daily.  . fluticasone (FLONASE) 50 MCG/ACT nasal spray One spray in each nostril twice a day, use left hand for right nostril, and right hand for left nostril.  . hydrOXYzine (VISTARIL) 25 MG capsule Take 1 capsule (25 mg total) by mouth daily.  [name] Kitchen ibuprofen (ADVIL,MOTRIN) 800 MG tablet Take 1 tablet (800 mg total) by mouth every 8 (eight) hours as needed for mild pain.  [name] Kitchen ipratropium (ATROVENT) 0.06 % nasal spray Place 2 sprays into both nostrils 4 (four) times daily.  [name] Kitchen lamoTRIgine (LAMICTAL) 100 MG tablet Take 1 tablet (100 mg total) by mouth 2 (two) times daily.  [name] Kitchen loratadine (CLARITIN) 10 MG tablet Take 1 tablet (10 mg total) by mouth daily.  . Melatonin 5 MG CAPS Take by mouth.  . meloxicam (MOBIC) 15 MG tablet Take 1 tablet (15 mg total) by mouth daily. For 2weeks then as needed for neck pain.  . Probiotic Product (PROBIOTIC & ACIDOPHILUS EX ST PO) Take by mouth.  [name] Kitchen  traZODone (DESYREL) 50 MG tablet Take 1 tablet (50 mg total) by mouth at bedtime.  . [DISCONTINUED] hydrOXYzine (VISTARIL) 25 MG capsule Take 1 capsule (25 mg total) by mouth daily.  . [DISCONTINUED] lamoTRIgine (LAMICTAL) 100 MG tablet Take 1 tablet (100 mg total) by mouth 2 (two) times daily.  . [DISCONTINUED] traZODone (DESYREL) 50 MG tablet Take 1 tablet (50 mg total) by mouth at bedtime.   No facility-administered encounter medications on file as of 08/23/2015.     Family History; Family  History  Problem Relation Age of Onset  . Kidney cancer Mother 32  . COPD Mother   . Cancer Mother     renal  . Kidney disease Mother   . Allergic rhinitis Mother   . Asthma Mother   . Heart disease Father   . Allergic rhinitis Father   . COPD Sister   . Depression Sister   . Heart disease Brother   . Heart attack Brother   . Heart disease Paternal Uncle   . Colon cancer Neg Hx   . Rectal cancer Neg Hx   . Stomach cancer Neg Hx   . Colon polyps Neg Hx   . Diabetes      neice  . Thyroid disease      neice  . Depression Maternal Aunt   . Depression Maternal Grandfather        Labs:  Recent Results (from the past 2160 hour(s))  Lipid Profile     Status: Abnormal   Collection Time: 07/05/15  9:20 AM  Result Value Ref Range   Cholesterol 217 (H) 125 - 200 mg/dL   Triglycerides 46 <150 mg/dL   HDL 94 >=46 mg/dL   Total CHOL/HDL Ratio 2.3 <=5.0 Ratio   VLDL 9 <30 mg/dL   LDL Cholesterol 114 <130 mg/dL    Comment:   Total Cholesterol/HDL Ratio:CHD Risk                        Coronary Heart Disease Risk Table                                        Men       Women          1/2 Average Risk              3.4        3.3              Average Risk              5.0        4.4           2X Average Risk              9.6        7.1           3X Average Risk             23.4       11.0 Use the calculated Patient Ratio above and the CHD Risk table  to determine the patient's CHD Risk.   HgB A1c     Status: Abnormal   Collection Time: 07/05/15  9:20 AM  Result Value Ref Range   Hgb A1c MFr Bld 5.9 (H) <5.7 %    Comment:  According to the ADA Clinical Practice Recommendations for 2011, when HbA1c is used as a screening test:     >=6.5%   Diagnostic of Diabetes Mellitus            (if abnormal result is confirmed)   5.7-6.4%   Increased risk of developing Diabetes Mellitus   References:Diagnosis and  Classification of Diabetes Mellitus,Diabetes [id number] 1):S62-S69 and Standards of Medical Care in         Diabetes - 2011,Diabetes [id number] (Suppl 1):S11-S61.      Mean Plasma Glucose 123 (H) <117 mg/dL       Musculoskeletal: Strength & Muscle Tone: within normal limits Gait & Station: normal Patient leans: N/A  Mental Status Examination;   Psychiatric Specialty Exam: Physical Exam  Constitutional: She appears well-developed and well-nourished.  Skin: She is not diaphoretic.    Review of Systems  Constitutional: Negative for fever.  Musculoskeletal: Positive for myalgias and back pain.  Skin: Negative for rash.  Neurological: Negative for tremors and headaches.  Psychiatric/Behavioral: Negative for depression and suicidal ideas. The patient has insomnia.     There were no vitals taken for this visit.There is no weight on file to calculate BMI.  General Appearance: Casual  Eye Contact::  Fair  Speech:  coherent  Volume:  Normal  Mood: less labile   Affect:  congruent  Thought Process: clear . No psychosis  Orientation:  Full (Time, Place, and Person)  Thought Content:  Rumination  Suicidal Thoughts:  No  Homicidal Thoughts:  No  Memory:  Immediate;   Fair Recent;   Fair  Judgement:  Fair  Insight:  Shallow  Psychomotor Activity:  Increased  Concentration:  Fair  Recall:  Fair  Akathisia:  Negative  Handed:  Right  AIMS (if indicated):     Assets:  Desire for Improvement Physical Health Transportation  Sleep:        Assessment: Axis I: bipolar disorder II unspecified or depressed type. Anxiety disorder NOS. Insomnia  Axis II: deferred  Axis III:  Past Medical History  Diagnosis Date  . Hyperlipidemia   . Allergic rhinitis 11/08/2010  . Osteoporosis 11/21/2011  . Asthma   . Bronchitis   . Anxiety   . Mitral valve prolapse   . Herpes simplex of female genitalia   . Abnormal Pap smear of cervix   . Urticaria     Axis IV:  psychosocial   Treatment Plan and Summary:  Continue Lamictal 100 mg twice a day for mood symptoms of bipolar. No rash reported.  GAD: vistaril prn. Coping skills for anxiety.  Alternate trazadone and vistaril for sleep. Sometimes she takes melatonin as well. Prn vistaril for anxiety Insomnia: sleep hygiene and better manage of pain.  I continue to  recommend  she follows up with psychotherapy in order to work on her anxiety, insomnia and medication compliance  Pertinent Labs and Relevant Prior Notes reviewed. Medication Side effects, benefits and risks reviewed/discussed with Patient. Time given for patient to respond and asks questions regarding the Diagnosis and Medications. Safety concerns and to report to ER if suicidal or call 911. Relevant Medications refilled or called in to pharmacy.  Follow up with Primary care provider in regards to Medical conditions. Recommend compliance with medications and follow up office appointments. Discussed to avail opportunity to consider or/and continue Individual therapy with Counselor. Greater than 50% of time was spend in counseling and coordination of care with the patient.  followup in 2 months.   Time spent:  25 minutes.  Merian Capron, MD 08/23/2015

## 2015-08-23 NOTE — Therapy (Signed)
[place], [place], [address] Phone: [phone]   Fax:  [phone]  Physical Therapy Evaluation  Patient Details  Name: Kellie [name] MRN: [id number] Date of Birth: 1955/07/18 Referring Provider: [name]  Encounter Date: 08/23/2015      PT End of Session - 08/23/15 1020    Visit Number 6   Number of Visits 12   Date for PT Re-Evaluation 09/20/15   PT Start Time 1020   PT Stop Time 1112   PT Time Calculation (min) 52 min   Activity Tolerance Patient tolerated treatment well      Past Medical History  Diagnosis Date  . Hyperlipidemia   . Allergic rhinitis 11/08/2010  . Osteoporosis 11/21/2011  . Asthma   . Bronchitis   . Anxiety   . Mitral valve prolapse   . Herpes simplex of female genitalia   . Abnormal Pap smear of cervix   . Urticaria     Past Surgical History  Procedure Laterality Date  . Leep  11/2004  . Closed manipulation shoulder  7&01/2005    x2, 30 days apart  . Esophagogastroduodenoscopy  09/2012    Normal (Dr. Ardis Hughs)  . Colonoscopy  09/2012    Medium sized external hemorrhoids, few small divertic in left colon, otherwise normal colon (repeat 10 yrs)  . Closed manipulation shoulder  2006    Left    There were no vitals filed for this visit.  Visit Diagnosis:  Neck pain - Plan: PT plan of care cert/re-cert  Shoulder stiffness, right - Plan: PT plan of care cert/re-cert  Weakness - Plan: PT plan of care cert/re-cert  Right shoulder pain - Plan: PT plan of care cert/re-cert      Subjective Assessment - 08/23/15 1018    Subjective Pt reports that she thinks the traction helped. Rt arm pain has woke her up the last nights.  it was a little less before that. Seeing ENT tomorrow.    Currently in Pain? Yes   Pain Score 8    Pain Location Arm   Pain Orientation Right   Pain Descriptors / Indicators Aching   Pain Type Chronic pain   Aggravating Factors  lifting     Pain Relieving Factors meds            [facility] PT Assessment - 08/23/15 0001    Assessment   Medical Diagnosis Cervical DDD; Adhesive capsulitis R shoulder   Referring Provider [name]   Onset Date/Surgical Date 09/09/14   Hand Dominance Right   ROM / Strength   AROM / PROM / Strength AROM;Strength   AROM   AROM Assessment Site Shoulder   Right Shoulder Flexion 155 Degrees   Right Shoulder Internal Rotation 80 Degrees   Right Shoulder External Rotation 85 Degrees   Cervical - Right Side Bend 19   Cervical - Left Side Bend 24   Cervical - Right Rotation 57   Cervical - Left Rotation 45   Strength   Strength Assessment Site Shoulder   Right/Left Shoulder Right   Right Shoulder Extension 4+/5   Right Shoulder ABduction 5/5   Right Shoulder Internal Rotation 5/5   Right Shoulder External Rotation 4+/5                   [facility] Adult PT Treatment/Exercise - 08/23/15 0001    Shoulder Exercises: Supine   External Rotation Strengthening;Both;Theraband  while holding a bridge position   Theraband Level (  Shoulder External Rotation) Level 3 (Green)   Other Supine Exercises shoulder ab/adduction in table top position   Other Supine Exercises head presses   Shoulder Exercises: Prone   Other Prone Exercises T's & arms by low back with upper body lifts 2x10   Shoulder Exercises: Sidelying   Other Sidelying Exercises upper body rotation with green band upper body resistance   Shoulder Exercises: ROM/Strengthening   UBE (Upper Arm Bike) L2x6' alt FWD/BWD   Modalities   Modalities Traction   Traction   Type of Traction Cervical   Min (lbs) 6   Max (lbs) 12   Hold Time 5   Rest Time 5   Time 12                     PT Long Term Goals - 08/23/15 1043    PT LONG TERM GOAL #1   Title I with HEP   Time 4   Period Weeks   Status On-going   PT LONG TERM GOAL #2   Title decreased neck and shoulder pain by 50% or more with ADLS   Time 4   Period  Weeks   Status Achieved  overall 65-70% improvement   PT LONG TERM GOAL #3   Title Improved R shoulder strength to 4+ to 5/5 to improve function   Status Achieved   PT LONG TERM GOAL #4   Title improved mid/low trap strength to 4/5 or better to improve posture.   Time 4   Period Weeks   Status On-going               Plan - 08/23/15 1026    Clinical Impression Statement Kellie [name] has made progress with her shoulder, she continues with neck pain and also has low back pain that flared up the other day.  She would benefit from continueing PT she has only been able to attend once a week and this explains the slower than expected progress.    Pt will benefit from skilled therapeutic intervention in order to improve on the following deficits Decreased range of motion;Decreased activity tolerance;Pain;Impaired flexibility;Postural dysfunction;Decreased strength   Rehab Potential Good   PT Frequency 1x / week   PT Duration 4 weeks   PT Treatment/Interventions ADLs/Self Care Home Management;Electrical Stimulation;Moist Heat;Therapeutic exercise;Ultrasound;Neuromuscular re-education;Patient/family education;Manual techniques;Dry needling;Passive range of motion   PT Next Visit Plan assess response to traction   PT [place] - can we begin treating her core as she is having some lower back issues that go along with her neck issues.    Consulted and Agree with Plan of Care Patient         Problem List Patient Active Problem List   Diagnosis Date Noted  . Cough 07/21/2015  . Post-nasal drip 07/21/2015  . Radiculitis of right cervical region 07/06/2015  . Adhesive capsulitis 07/05/2015  . DDD (degenerative disc disease), cervical 07/05/2015  . Piriformis syndrome 03/01/2015  . Chronic nasal congestion 03/01/2015  . Vitreous floaters of right eye 03/01/2015  . Sacroiliac joint dysfunction of right side 02/25/2015  . Near syncope 02/09/2015  . Sinusitis 02/09/2015  .  Rhinitis, allergic 10/14/2014  . Family history of renal cancer 10/11/2014  . DDD (degenerative disc disease), lumbar 10/11/2014  . Bipolar disorder with depression ([place]) 06/02/2014  . Postmenopausal atrophic vaginitis 03/17/2014  . Prediabetes 12/14/2013  . Adhesive capsulitis of right shoulder 11/24/2013  . Chronic periodontal disease 06/30/2013  . Genital herpes 11/05/2012  .  Hemorrhoids 11/05/2012  . Borderline intellectual functioning 02/15/2012  . Osteoporosis 11/21/2011  . Allergic rhinitis 11/08/2010  . Insomnia 11/08/2010  . GERD (gastroesophageal reflux disease) 09/27/2010    Jeral Pinch PT 08/23/2015, 1:47 PM  [facility] [place], [place], [address] Phone: [phone]   Fax:  [phone]  Name: Kellie [name] MRN: [id number] Date of Birth: [birth year]

## 2015-08-25 ENCOUNTER — Ambulatory Visit (HOSPITAL_COMMUNITY): Payer: Self-pay | Admitting: Psychiatry

## 2015-08-31 ENCOUNTER — Encounter: Payer: Self-pay | Admitting: Physical Therapy

## 2015-08-31 ENCOUNTER — Ambulatory Visit (INDEPENDENT_AMBULATORY_CARE_PROVIDER_SITE_OTHER): Payer: No Typology Code available for payment source | Admitting: Physical Therapy

## 2015-08-31 DIAGNOSIS — M545 Low back pain, unspecified: Secondary | ICD-10-CM

## 2015-08-31 DIAGNOSIS — M25611 Stiffness of right shoulder, not elsewhere classified: Secondary | ICD-10-CM

## 2015-08-31 DIAGNOSIS — M542 Cervicalgia: Secondary | ICD-10-CM

## 2015-08-31 DIAGNOSIS — R531 Weakness: Secondary | ICD-10-CM

## 2015-08-31 NOTE — Therapy (Signed)
[place], [place], [address] Phone: [phone]   Fax:  [phone]  Physical Therapy Treatment  Patient Details  Name: Kellie [name] MRN: [id number] Date of Birth: Jul 21, 1955 Referring Provider: [name]  Encounter Date: 08/31/2015      PT End of Session - 08/31/15 1015    PT Start Time 0937   PT Stop Time 1029   PT Time Calculation (min) 52 min   Activity Tolerance Patient tolerated treatment well      Past Medical History  Diagnosis Date  . Hyperlipidemia   . Allergic rhinitis 11/08/2010  . Osteoporosis 11/21/2011  . Asthma   . Bronchitis   . Anxiety   . Mitral valve prolapse   . Herpes simplex of female genitalia   . Abnormal Pap smear of cervix   . Urticaria     Past Surgical History  Procedure Laterality Date  . Leep  11/2004  . Closed manipulation shoulder  7&01/2005    x2, 30 days apart  . Esophagogastroduodenoscopy  09/2012    Normal (Dr. Ardis Hughs)  . Colonoscopy  09/2012    Medium sized external hemorrhoids, few small divertic in left colon, otherwise normal colon (repeat 10 yrs)  . Closed manipulation shoulder  2006    Left    There were no vitals filed for this visit.  Visit Diagnosis:  Neck pain  Shoulder stiffness, right  Weakness  Right-sided low back pain without sciatica      Subjective Assessment - 08/31/15 0939    Subjective Pt reports her low back is really sore today and feels like she needs it worked on.  ( PA has signed off on the recert to include this)    Currently in Pain? Yes   Pain Score 9    Pain Location Back   Pain Orientation Lower;Right   Pain Descriptors / Indicators Aching   Pain Type Chronic pain   Pain Onset More than a month ago   Pain Frequency Constant   Aggravating Factors  everything   Pain Relieving Factors nothing for the low back. Trying heat and gently stretching.                          [facility] Adult PT  Treatment/Exercise - 08/31/15 0001    Posture/Postural Control   Posture Comments --  Rt LE longer than Lt ~ 1/4 inch   Exercises   Exercises Lumbar   Neck Exercises: Machines for Strengthening   UBE (Upper Arm Bike) L2x5' Alt FWD/BWD    Lumbar Exercises: Stretches   Double Knee to Chest Stretch 30 seconds   Lower Trunk Rotation --  10 reps   ITB Stretch 30 seconds  cross body stretch with strap   Lumbar Exercises: Prone   Other Prone Lumbar Exercises pelvic press series 10 each upper/lower   Modalities   Modalities Electrical Stimulation;Moist Heat   Moist Heat Therapy   Number Minutes Moist Heat 15 Minutes   Moist Heat Location Lumbar Spine   Electrical Stimulation   Electrical Stimulation Location lumbar spine   Electrical Stimulation Action IFC   Electrical Stimulation Parameters  to tolerance    Electrical Stimulation Goals Pain   Manual Therapy   Manual Therapy Muscle Energy Technique   Muscle Energy Technique to rotate Rt ilium back - relieved some of her pain  PT Education - 08/31/15 0959    Education provided Yes   Education Details HEP for low back   Person(s) Educated Patient   Methods Demonstration;Handout   Comprehension Returned demonstration;Need further instruction             PT Long Term Goals - 08/31/15 0942    PT LONG TERM GOAL #1   Title I with HEP   Status On-going   PT LONG TERM GOAL #4   Title improved mid/low trap strength to 4/5 or better to improve posture.   Status On-going   PT LONG TERM GOAL #5   Title report =/> 50% reduction in low back pain.    Time 4   Period Weeks   Status New               Plan - 08/31/15 1016    Clinical Impression Statement Treatment today was focused on her low back. She was out of alignment, responded well to MET to realign and then performed stabilization exercises to hold her in place. These were challenging and she fatigued quickly with them.    Pt will benefit from  skilled therapeutic intervention in order to improve on the following deficits Decreased range of motion;Decreased activity tolerance;Pain;Impaired flexibility;Postural dysfunction;Decreased strength   Rehab Potential Good   PT Frequency 1x / week   PT Duration 4 weeks   PT Treatment/Interventions ADLs/Self Care Home Management;Electrical Stimulation;Moist Heat;Therapeutic exercise;Ultrasound;Neuromuscular re-education;Patient/family education;Manual techniques;Dry needling;Passive range of motion   PT Next Visit Plan reassess alignment and core stab from lumbar to cervical .    Consulted and Agree with Plan of Care Patient        Problem List Patient Active Problem List   Diagnosis Date Noted  . Cough 07/21/2015  . Post-nasal drip 07/21/2015  . Radiculitis of right cervical region 07/06/2015  . Adhesive capsulitis 07/05/2015  . DDD (degenerative disc disease), cervical 07/05/2015  . Piriformis syndrome 03/01/2015  . Chronic nasal congestion 03/01/2015  . Vitreous floaters of right eye 03/01/2015  . Sacroiliac joint dysfunction of right side 02/25/2015  . Near syncope 02/09/2015  . Sinusitis 02/09/2015  . Rhinitis, allergic 10/14/2014  . Family history of renal cancer 10/11/2014  . DDD (degenerative disc disease), lumbar 10/11/2014  . Bipolar disorder with depression ([place]) 06/02/2014  . Postmenopausal atrophic vaginitis 03/17/2014  . Prediabetes 12/14/2013  . Adhesive capsulitis of right shoulder 11/24/2013  . Chronic periodontal disease 06/30/2013  . Genital herpes 11/05/2012  . Hemorrhoids 11/05/2012  . Borderline intellectual functioning 02/15/2012  . Osteoporosis 11/21/2011  . Allergic rhinitis 11/08/2010  . Insomnia 11/08/2010  . GERD (gastroesophageal reflux disease) 09/27/2010    Jeral Pinch PT 08/31/2015, 10:19 AM  [facility] [place], [place], [address] Phone: [phone]   Fax:   [phone]  Name: NIXON SPARR MRN: [id number] Date of Birth: Aug 01, 1955

## 2015-08-31 NOTE — Patient Instructions (Signed)
Pelvic Press     [place] [phone]   Place hands under belly between navel and pubic bone, palms up. Feel pressure on hands. Increase pressure on hands by pressing pelvis down. This is NOT a pelvic tilt. Hold __5_ seconds. Relax. Repeat _10__ times. Once a day.  KNEE: Flexion - Prone   Hold pelvic press. Bend both knees then straighten them. Do not raise hips. _10__ reps per set. Once a day   Leg Lift: One-Leg   Press pelvis down. Keep knee straight; lengthen and lift one leg (from waist). Do not twist body. Keep other leg down. Hold _1__ seconds. Relax. Repeat 10 time. Repeat with other leg. Once a day  HIP: Extension / KNEE: Flexion - Prone    Hold pelvic press. Bend knee, squeeze glutes. Raise leg up  10___ reps per set, _1__ sets per day. Repeat on the other leg.  _1__ time a day.   Axial Extension- Upper body sequence * always start with pelvic press    Lie on stomach with forehead resting on floor and arms at sides. Tuck chin in and raise head from floor without bending it up or down. Repeat ___10_ times per set. Do __1__ sets per session. Do _1___ sessions per day.  Progression:  Arms at side Arms in T shape Arms in W shape  Arms in M shape Arms in Y shape    [place] [facility] [place], [place] [address]  [phone] (office) [phone] (fax)  Knee-to-Chest Stretch: Bilateral    With hands behind knees, pull both knees in to chest until a comfortable stretch is felt in lower back and buttocks. Keep back relaxed. Hold __30-60__ seconds. Repeat __1__ times per set. Do __1__ sets per session. Do __1__ sessions per day.  Lumbar Rotation (Non-Weight Bearing)    Feet on floor, slowly rock knees from side to side in small, pain-free range of motion. Allow lower back to rotate slightly. Repeat __10__ times per set. Do __1__ sets per session. Do __1__ sessions per day.  Outer Hip Stretch:  Reclined IT Band Stretch (Strap)    Strap around opposite foot, pull across only as far as possible with shoulders on mat. Hold for __30-60__ secs. Repeat __1__ times each leg.

## 2015-09-06 ENCOUNTER — Encounter: Payer: Self-pay | Admitting: Physical Therapy

## 2015-09-13 ENCOUNTER — Ambulatory Visit (INDEPENDENT_AMBULATORY_CARE_PROVIDER_SITE_OTHER): Payer: No Typology Code available for payment source | Admitting: Physical Therapy

## 2015-09-13 ENCOUNTER — Encounter: Payer: Self-pay | Admitting: Physical Therapy

## 2015-09-13 DIAGNOSIS — M542 Cervicalgia: Secondary | ICD-10-CM

## 2015-09-13 DIAGNOSIS — M25611 Stiffness of right shoulder, not elsewhere classified: Secondary | ICD-10-CM

## 2015-09-13 DIAGNOSIS — R531 Weakness: Secondary | ICD-10-CM

## 2015-09-13 NOTE — Therapy (Signed)
[facility] [place], [place], [address] Phone: [phone]   Fax:  [phone]  Physical Therapy Treatment  Patient Details  Name: Kellie [name] MRN: [id number] Date of Birth: 03/03/1956 Referring Provider: [name]  Encounter Date: 09/13/2015      PT End of Session - 09/13/15 0930    Visit Number 7   Number of Visits 12   Date for PT Re-Evaluation 09/20/15   PT Start Time 0930  had to leave early to get sister to MD   PT Stop Time 1009   PT Time Calculation (min) 39 min      Past Medical History  Diagnosis Date  . Hyperlipidemia   . Allergic rhinitis 11/08/2010  . Osteoporosis 11/21/2011  . Asthma   . Bronchitis   . Anxiety   . Mitral valve prolapse   . Herpes simplex of female genitalia   . Abnormal Pap smear of cervix   . Urticaria     Past Surgical History  Procedure Laterality Date  . Leep  11/2004  . Closed manipulation shoulder  7&01/2005    x2, 30 days apart  . Esophagogastroduodenoscopy  09/2012    Normal (Dr. Ardis Hughs)  . Colonoscopy  09/2012    Medium sized external hemorrhoids, few small divertic in left colon, otherwise normal colon (repeat 10 yrs)  . Closed manipulation shoulder  2006    Left    There were no vitals filed for this visit.  Visit Diagnosis:  Neck pain  Shoulder stiffness, right  Weakness      Subjective Assessment - 09/13/15 0933    Subjective Pt sees her ENT after this appointment. Her back feels better now.  Bought a ball to exercise with. Has the most problem with lifting her case of water.    Currently in Pain? No/denies                         [facility] Adult PT Treatment/Exercise - 09/13/15 0001    Lumbar Exercises: Standing   Functional Squats 20 reps  place cones down/up    Lumbar Exercises: Quadruped   Opposite Arm/Leg Raise Left arm/Right leg;Right arm/Left leg;5 reps  10 sec holds   Shoulder Exercises: Standing   Flexion  Strengthening;Both;20 reps;Weights   Flexion Limitations 3#   Other Standing Exercises bicep curls bilat 4# leaning against noodle  counter top pushups with elbows close for triceps   Other Standing Exercises 5 reps wall slide/hold with FWD reach 6#   Shoulder Exercises: ROM/Strengthening   UBE (Upper Arm Bike) L2x6' alt FWD/BWD   Shoulder Exercises: Stretch   Other Shoulder Stretches doorway stretch low and mid                     PT Long Term Goals - 09/13/15 0935    PT LONG TERM GOAL #1   Title I with HEP   Status On-going   PT LONG TERM GOAL #2   Title decreased neck and shoulder pain by 50% or more with ADLS   Status Achieved   PT LONG TERM GOAL #3   Title Improved R shoulder strength to 4+ to 5/5 to improve function   Status Achieved   PT LONG TERM GOAL #4   Title improved mid/low trap strength to 4/5 or better to improve posture.   Status On-going   PT LONG TERM GOAL #5   Title report =/> 50% reduction in  low back pain.    Status On-going               Plan - 09/13/15 1012    Clinical Impression Statement Sharons progress fluctuates based on her activity.  Her biggest reinjury activity is lifting cases of water.  We will work on safe ways to do this along with functional strengthening over the next couple of visits.    Pt will benefit from skilled therapeutic intervention in order to improve on the following deficits Decreased range of motion;Decreased activity tolerance;Pain;Impaired flexibility;Postural dysfunction;Decreased strength   Rehab Potential Good   PT Frequency 1x / week   PT Duration 4 weeks   PT Treatment/Interventions ADLs/Self Care Home Management;Electrical Stimulation;Moist Heat;Therapeutic exercise;Ultrasound;Neuromuscular re-education;Patient/family education;Manual techniques;Dry needling;Passive range of motion   PT Next Visit Plan functional strengthening   Consulted and Agree with Plan of Care Patient        Problem  List Patient Active Problem List   Diagnosis Date Noted  . Cough 07/21/2015  . Post-nasal drip 07/21/2015  . Radiculitis of right cervical region 07/06/2015  . Adhesive capsulitis 07/05/2015  . DDD (degenerative disc disease), cervical 07/05/2015  . Piriformis syndrome 03/01/2015  . Chronic nasal congestion 03/01/2015  . Vitreous floaters of right eye 03/01/2015  . Sacroiliac joint dysfunction of right side 02/25/2015  . Near syncope 02/09/2015  . Sinusitis 02/09/2015  . Rhinitis, allergic 10/14/2014  . Family history of renal cancer 10/11/2014  . DDD (degenerative disc disease), lumbar 10/11/2014  . Bipolar disorder with depression ([place]) 06/02/2014  . Postmenopausal atrophic vaginitis 03/17/2014  . Prediabetes 12/14/2013  . Adhesive capsulitis of right shoulder 11/24/2013  . Chronic periodontal disease 06/30/2013  . Genital herpes 11/05/2012  . Hemorrhoids 11/05/2012  . Borderline intellectual functioning 02/15/2012  . Osteoporosis 11/21/2011  . Allergic rhinitis 11/08/2010  . Insomnia 11/08/2010  . GERD (gastroesophageal reflux disease) 09/27/2010    Virgie Kunda PT 09/13/2015, 10:13 AM  [facility] [place], [place], [address] Phone: [phone]   Fax:  [phone]  Name: TATEYANA DIPONIO MRN: [id number] Date of Birth: 04/07/1956

## 2015-09-20 ENCOUNTER — Ambulatory Visit (INDEPENDENT_AMBULATORY_CARE_PROVIDER_SITE_OTHER): Payer: No Typology Code available for payment source | Admitting: Physical Therapy

## 2015-09-20 DIAGNOSIS — M545 Low back pain, unspecified: Secondary | ICD-10-CM

## 2015-09-20 DIAGNOSIS — M25611 Stiffness of right shoulder, not elsewhere classified: Secondary | ICD-10-CM

## 2015-09-20 DIAGNOSIS — M542 Cervicalgia: Secondary | ICD-10-CM

## 2015-09-20 DIAGNOSIS — R531 Weakness: Secondary | ICD-10-CM

## 2015-09-20 NOTE — Patient Instructions (Signed)
Progressive Resisted: Flexion (Standing) - Can do the standing exercises leaning on a wall.  Keep belly tight for all these exercises.      Holding _3___ pound weights, raise arms toward ceiling. Keep elbows straight. Repeat __10__ times per set. Do __3__ sets per session. Do __1__ sessions per day.  Progressive Resisted: Extension (Standing)    Holding _3___ pound weights, raise arms back, keeping elbows straight. Repeat __10__ times per set. Do __3__ sets per session. Do __1__ sessions per day.  Elbow Flexion: Resisted    With right arm straight, thumb forward, Holding _3-4___ pound weight, bend elbow. Return slowly. Repeat _10___ times per set. Do __3__ sets per session. Do __1__ sessions per day.   Elbow Extension: Press - Resisted    Lie on back, _5___ pound weight in right hand, arm out to side, elbow bent. Press arm toward ceiling. Return slowly. Repeat __10__ times per set. Do __3__ sets per session. Do __1__ sessions per day.  Copyright  VHI. All rights reserved. [place] [phone]

## 2015-09-20 NOTE — Therapy (Signed)
Kellie [name] [place], [place], [address] Phone: [phone]   Fax:  [phone]  Physical Therapy Treatment  Patient Details  Name: Kellie [name] MRN: [id number] Date of Birth: 08/29/1955 Referring Provider: Olin [name]  Encounter Date: 09/20/2015      PT End of Session - 09/20/15 0805    Visit Number 8   Number of Visits 12   Date for PT Re-Evaluation 09/20/15   PT Start Time 0805   PT Stop Time 6378   PT Time Calculation (min) 42 min   Activity Tolerance Patient tolerated treatment well      Past Medical History  Diagnosis Date  . Hyperlipidemia   . Allergic rhinitis 11/08/2010  . Osteoporosis 11/21/2011  . Asthma   . Bronchitis   . Anxiety   . Mitral valve prolapse   . Herpes simplex of female genitalia   . Abnormal Pap smear of cervix   . Urticaria     Past Surgical History  Procedure Laterality Date  . Leep  11/2004  . Closed manipulation shoulder  7&01/2005    x2, 30 days apart  . Esophagogastroduodenoscopy  09/2012    Normal (Dr. Ardis Hughs)  . Colonoscopy  09/2012    Medium sized external hemorrhoids, few small divertic in left colon, otherwise normal colon (repeat 10 yrs)  . Closed manipulation shoulder  2006    Left    There were no vitals filed for this visit.  Visit Diagnosis:  Neck pain  Shoulder stiffness, right  Weakness  Right-sided low back pain without sciatica      Subjective Assessment - 09/20/15 0805    Subjective Kellie [name] reports that she is mainly feeling pain in her Rt low back. Opha states her neck is doing really good.    Currently in Pain? Yes  however will be severe when it flares up.   Pain Score 2    Pain Location Back   Pain Orientation Right;Lower   Pain Descriptors / Indicators Aching   Pain Type Chronic pain   Pain Onset More than a month ago   Pain Frequency Constant   Aggravating Factors  random   Pain Relieving Factors nothing.             [facility] PT Assessment - 09/20/15 0001    Assessment   Medical Diagnosis Cervical DDD; Adhesive capsulitis R shoulder   Referring Provider Kellie [name]   Onset Date/Surgical Date 09/09/14   Hand Dominance Right   AROM   AROM Assessment Site Shoulder;Cervical   Right/Left Shoulder Right   Right Shoulder Flexion 163 Degrees   Right Shoulder Internal Rotation 90 Degrees   Right Shoulder External Rotation 60 Degrees   Cervical Flexion 30   Cervical Extension 45   Cervical - Right Side Bend 34   Cervical - Left Side Bend 30   Cervical - Right Rotation 60   Cervical - Left Rotation 60   Strength   Overall Strength Comments mid traps Rt 4+/5, Lt 5-/5   Strength Assessment Site Shoulder   Right/Left Shoulder Right   Right Shoulder Extension 5/5   Right Shoulder ABduction 5/5   Right Shoulder Internal Rotation 5/5   Right Shoulder External Rotation 5/5                     [facility] Adult PT Treatment/Exercise - 09/20/15 0001    Elbow Exercises   Elbow Flexion Strengthening;Both  30   Bar Weights/Barbell (Elbow  Flexion) 3 lbs   Lumbar Exercises: Stretches   ITB Stretch 30 seconds  cross body with strap   Lumbar Exercises: Supine   Ab Set 10 reps  with leg ext   Isometric Hip Flexion 5 reps;5 seconds   Shoulder Exercises: Supine   Other Supine Exercises 30 reps chest press and overhead pull, 3# each hand   Shoulder Exercises: Standing   Flexion Strengthening;Both;Weights  30   Flexion Limitations 3#   Extension Strengthening;Both;Weights  30 reps in mini squat position   Shoulder Exercises: ROM/Strengthening   UBE (Upper Arm Bike) L2x6' alt FWD/BWD                PT Education - 09/20/15 0818    Education provided Yes   Education Details HEP   Person(s) Educated Patient   Methods Demonstration;Handout   Comprehension Returned demonstration             PT Long Term Goals - 09/20/15 0811    PT LONG TERM GOAL #1   Title I with HEP   Status  Achieved   PT LONG TERM GOAL #2   Title decreased neck and shoulder pain by 50% or more with ADLS   Status Achieved   PT LONG TERM GOAL #3   Title Improved R shoulder strength to 4+ to 5/5 to improve function   Status Achieved   PT LONG TERM GOAL #4   Title improved mid/low trap strength to 4/5 or better to improve posture.   Status Achieved   PT LONG TERM GOAL #5   Title report =/> 50% reduction in low back pain.    Status Not Met               Plan - 09/20/15 0840    Clinical Impression Statement Kellie [name] neck is feeling good, she has met all these goals.  Her concern now is her low back. This is a chronic issue, she has a HEP for this and will work on it.  Patient has also appled for Medicaid to see if she can get medical and dental coverage.    PT Next Visit Plan D/C to HEP    Consulted and Agree with Plan of Care Patient        Problem List Patient Active Problem List   Diagnosis Date Noted  . Cough 07/21/2015  . Post-nasal drip 07/21/2015  . Radiculitis of right cervical region 07/06/2015  . Adhesive capsulitis 07/05/2015  . DDD (degenerative disc disease), cervical 07/05/2015  . Piriformis syndrome 03/01/2015  . Chronic nasal congestion 03/01/2015  . Vitreous floaters of right eye 03/01/2015  . Sacroiliac joint dysfunction of right side 02/25/2015  . Near syncope 02/09/2015  . Sinusitis 02/09/2015  . Rhinitis, allergic 10/14/2014  . Family history of renal cancer 10/11/2014  . DDD (degenerative disc disease), lumbar 10/11/2014  . Bipolar disorder with depression ([place]) 06/02/2014  . Postmenopausal atrophic vaginitis 03/17/2014  . Prediabetes 12/14/2013  . Adhesive capsulitis of right shoulder 11/24/2013  . Chronic periodontal disease 06/30/2013  . Genital herpes 11/05/2012  . Hemorrhoids 11/05/2012  . Borderline intellectual functioning 02/15/2012  . Osteoporosis 11/21/2011  . Allergic rhinitis 11/08/2010  . Insomnia 11/08/2010  . GERD  (gastroesophageal reflux disease) 09/27/2010    Jeral Pinch  PT  09/20/2015, 8:45 AM  [facility] [place], [place], [address] Phone: [phone]   Fax:  [phone]  Name: Kellie [name] MRN: [id number] Date of Birth: 04-18-1956  PHYSICAL THERAPY DISCHARGE SUMMARY  Visits from Start of Care: 8  Current functional level related to goals / functional outcomes: See above  Remaining deficits: Low back intermittent pain   Education / Equipment: HEP  Plan: Patient agrees to discharge.  Patient goals were partially met. Patient is being discharged due to                                                    Patient has reached maximun benefit at this time.  ?????   Jeral Pinch, PT 09/20/2015 8:47 AM

## 2015-10-06 ENCOUNTER — Other Ambulatory Visit: Payer: Self-pay | Admitting: Physician Assistant

## 2015-10-11 ENCOUNTER — Telehealth: Payer: Self-pay | Admitting: *Deleted

## 2015-10-11 NOTE — Telephone Encounter (Signed)
Pt left vm stating that she would like you to "renew" her order for PT.  She stated that she is still having cervical & lumbar pain.

## 2015-10-11 NOTE — Telephone Encounter (Signed)
Did it help? Give me a percentage 20-30-40-50-60 percent

## 2015-10-12 NOTE — Telephone Encounter (Signed)
I don't think insurance is going to approve any more visits since last note stated maximum benefit is reached. I would suggest coming to see Dr. Georgina Snell and getting a shoulder injection.

## 2015-10-12 NOTE — Telephone Encounter (Signed)
Pt stated that the therapy helps tremendously but it doesn't take much for her to get "outta whack" again.  She stated that little things like lifting a case of water, bending over the kitchen sink to wash her hair, or standing still too long will make her feel like she's "at the beginning".

## 2015-10-13 NOTE — Telephone Encounter (Signed)
LMOM notifying pt.

## 2015-11-03 ENCOUNTER — Telehealth: Payer: Self-pay | Admitting: *Deleted

## 2015-11-03 DIAGNOSIS — R5383 Other fatigue: Secondary | ICD-10-CM

## 2015-11-03 NOTE — Telephone Encounter (Signed)
Pt called and wanted [place] to order Hep C, check her liver and kidney function,vitamin D,magnesium and cholesterol. We just did lipid in Jan.Per Jade ok to do other labs but the lipid since this was just done.used DX from previous labs in which most of those test were ordered under

## 2015-11-08 LAB — COMPLETE METABOLIC PANEL WITH GFR
ALT: 11 U/L (ref 6–29)
AST: 16 U/L (ref 10–35)
Albumin: 4.6 g/dL (ref 3.6–5.1)
Alkaline Phosphatase: 42 U/L (ref 33–130)
BUN: 21 mg/dL (ref 7–25)
CALCIUM: 9.5 mg/dL (ref 8.6–10.4)
CHLORIDE: 104 mmol/L (ref 98–110)
CO2: 28 mmol/L (ref 20–31)
CREATININE: 0.73 mg/dL (ref 0.50–0.99)
GFR, Est Non African American: >89 mL/min (ref >=60)
Glucose, Bld: 86 mg/dL (ref 65–99)
Potassium: 4.2 mmol/L (ref 3.5–5.3)
Sodium: 140 mmol/L (ref 135–146)
Total Bilirubin: 0.4 mg/dL (ref 0.2–1.2)
Total Protein: 6.7 g/dL (ref 6.1–8.1)

## 2015-11-08 LAB — HEPATITIS C ANTIBODY: HCV AB: NEGATIVE

## 2015-11-08 LAB — MAGNESIUM: Magnesium: 2.1 mg/dL (ref 1.5–2.5)

## 2015-11-08 LAB — VITAMIN D 25 HYDROXY (VIT D DEFICIENCY, FRACTURES): VIT D 25 HYDROXY: 30 ng/mL (ref 30–100)

## 2015-11-09 ENCOUNTER — Encounter: Payer: Self-pay | Admitting: Family Medicine

## 2015-11-09 ENCOUNTER — Other Ambulatory Visit: Payer: Self-pay | Admitting: Physician Assistant

## 2015-11-09 ENCOUNTER — Ambulatory Visit (INDEPENDENT_AMBULATORY_CARE_PROVIDER_SITE_OTHER): Payer: No Typology Code available for payment source | Admitting: Family Medicine

## 2015-11-09 ENCOUNTER — Ambulatory Visit (INDEPENDENT_AMBULATORY_CARE_PROVIDER_SITE_OTHER): Payer: No Typology Code available for payment source

## 2015-11-09 VITALS — BP 114/74 | HR 63 | Wt 116.0 lb

## 2015-11-09 DIAGNOSIS — M25562 Pain in left knee: Secondary | ICD-10-CM

## 2015-11-09 DIAGNOSIS — M25561 Pain in right knee: Secondary | ICD-10-CM

## 2015-11-09 DIAGNOSIS — G8929 Other chronic pain: Secondary | ICD-10-CM | POA: Insufficient documentation

## 2015-11-09 DIAGNOSIS — M722 Plantar fascial fibromatosis: Secondary | ICD-10-CM | POA: Insufficient documentation

## 2015-11-09 MED ORDER — DICLOFENAC SODIUM 1 % TD GEL: 4.0000 g | Freq: Four times a day (QID) | TRANSDERMAL | Status: DC

## 2015-11-09 NOTE — Assessment & Plan Note (Signed)
Possibly DJD or degenerative meniscus injury. X-ray is largely normal-appearing. Plan for topical diclofenac gel and recheck in a month. Would consider injection at that time if not better.

## 2015-11-09 NOTE — Progress Notes (Signed)
Quick Note:  Knee x-ray is normal appearing. ______

## 2015-11-09 NOTE — Progress Notes (Signed)
   Subjective:    I'm seeing this patient as a consultation for:  [name] PA-C  CC: Knee pain, leg pain, and left heel pain  HPI:   1) left knee pain: Ongoing now for several months. She denies any injury weakness or numbness or loss of function. The pain is diffuse. She has not tried much treatment yet.  2) leg pain: Patient has bilateral thigh and calf soreness with activity slightly better with rest. No radiating pain weakness or numbness. No injury.  3) left hip pain: Patient has left plantar heel pain ongoing for several months now without injury. Pain is worse with standing and walking and better with rest. She has not tried any treatment.  Past medical history, Surgical history, Family history not pertinant except as noted below, Social history, Allergies, and medications have been entered into the medical record, reviewed, and no changes needed.   Review of Systems: No headache, visual changes, nausea, vomiting, diarrhea, constipation, dizziness, abdominal pain, skin rash, fevers, chills, night sweats, weight loss, swollen lymph nodes, body aches, joint swelling, muscle aches, chest pain, shortness of breath, mood changes, visual or auditory hallucinations.   Objective:    Filed Vitals:   11/09/15 0946  BP: 114/74  Pulse: 63   General: Well Developed, well nourished, and in no acute distress.  Neuro/Psych: Alert and oriented x3, extra-ocular muscles intact, able to move all 4 extremities, sensation grossly intact. Skin: Warm and dry, no rashes noted.  Respiratory: Not using accessory muscles, speaking in full sentences, trachea midline.  Cardiovascular: Pulses palpable, no extremity edema. Pulses and capillary refill and sensation intact bilateral lower extremities Abdomen: Does not appear distended. MSK:   1) left knee: Normal-appearing nontender normal motion rotation. Stable ligamentous exam.  2) bilateral calves and thighs normal-appearing nontender normal motion  normal gait.  3) left foot normal-appearing mildly tender palpation plantar medial calcaneus. Normal gait and motion.  X-ray left knee is largely normal-appearing. Awaiting formal radiology review.  No results found for this or any previous visit (from the past 24 hour(s)). No results found.  Impression and Recommendations:   This case required medical decision making of moderate complexity.

## 2015-11-09 NOTE — Assessment & Plan Note (Signed)
Calf and thigh soreness. Some mild concern for claudication. Obtain ABIs bilaterally. Recheck in a month or so.

## 2015-11-09 NOTE — Patient Instructions (Signed)
Thank you for coming in today. Apply voltaren gel to the knees 4x daily  We will call you about where the vascular ultrasound will be to work up your leg pain.  Do the exercises and ice massage for the feet.  Return in 1 month.

## 2015-11-09 NOTE — Assessment & Plan Note (Signed)
Foot pain is consistent with plantar fasciitis. Treat with eccentric calf exercises and ice massage. Recheck in a month.

## 2015-11-10 ENCOUNTER — Telehealth (HOSPITAL_COMMUNITY): Payer: Self-pay | Admitting: *Deleted

## 2015-11-10 ENCOUNTER — Other Ambulatory Visit: Payer: Self-pay | Admitting: *Deleted

## 2015-11-10 ENCOUNTER — Telehealth: Payer: Self-pay | Admitting: *Deleted

## 2015-11-10 MED ORDER — ALPRAZOLAM 0.5 MG PO TABS: ORAL_TABLET | ORAL | Status: DC

## 2015-11-10 NOTE — Telephone Encounter (Signed)
PT called office to informed Dr. De Nurse, she stop taking her medications since April 2017 due to cost. Pt would like to know what she needs to do because she is becoming more aggressive. Offered to schedule pt an appt to discuss medication changes with provider, pt decline. Pt states she would prefer medication samples. Referred pt to [facility] in [place].

## 2015-11-10 NOTE — Telephone Encounter (Signed)
Patient declining visit. Non compliant with our prior medications. Please refer to [facility] or other providers and can discharge or close the case

## 2015-11-10 NOTE — Telephone Encounter (Signed)
Pt left vm asking for her lab results to be mailed to her. Mailed today.

## 2015-11-11 ENCOUNTER — Other Ambulatory Visit: Payer: Self-pay | Admitting: Family Medicine

## 2015-11-11 DIAGNOSIS — M25562 Pain in left knee: Principal | ICD-10-CM

## 2015-11-11 DIAGNOSIS — M25561 Pain in right knee: Secondary | ICD-10-CM

## 2015-11-14 ENCOUNTER — Other Ambulatory Visit: Payer: Self-pay | Admitting: Physician Assistant

## 2015-11-15 ENCOUNTER — Ambulatory Visit (HOSPITAL_COMMUNITY): Payer: Self-pay | Admitting: Psychiatry

## 2015-11-15 ENCOUNTER — Other Ambulatory Visit: Payer: Self-pay | Admitting: Physician Assistant

## 2015-11-15 MED ORDER — LAMOTRIGINE 100 MG PO TABS: 100.0000 mg | ORAL_TABLET | Freq: Two times a day (BID) | ORAL | Status: DC

## 2015-11-15 MED ORDER — TRAZODONE HCL 50 MG PO TABS: 50.0000 mg | ORAL_TABLET | Freq: Every day | ORAL | Status: DC

## 2015-11-15 MED ORDER — HYDROXYZINE PAMOATE 25 MG PO CAPS: 25.0000 mg | ORAL_CAPSULE | Freq: Every day | ORAL | Status: DC

## 2015-11-15 NOTE — Telephone Encounter (Signed)
Pt called to get refill on meds. Informed pt she would have to be seen before we would give her refills. She stated that we understood her finical situation and he would write the rx anyway's. I informed pt she would need an apportionment ut pt declined. Pt is upset that we would "rather see her go crazy, than write a rx." I informed pt that i would talk to the nurse and dr.  Please advise.

## 2015-11-15 NOTE — Telephone Encounter (Signed)
Return telephone call to pt. Pt expressed concerns about medication refills for Vistaril, Lamictal and Trazodone Informed pt she will need to schedule an appt before refills would be sent to pharmacy. Pt reschedule appt to 11/24/15. Per Dr. De Nurse, please send medication refills for Vistaril 25mg , # 30, Lamictal 100mg , #60 and Trazodone 50mg , #30 to pharmacy.

## 2015-11-18 ENCOUNTER — Inpatient Hospital Stay (HOSPITAL_COMMUNITY): Admission: RE | Admit: 2015-11-18 | Payer: Self-pay | Source: Ambulatory Visit

## 2015-11-22 ENCOUNTER — Ambulatory Visit (HOSPITAL_COMMUNITY)
Admission: RE | Admit: 2015-11-22 | Discharge: 2015-11-22 | Disposition: A | Discharge status: Home / Self Care | Payer: No Typology Code available for payment source | Source: Ambulatory Visit | Attending: Family Medicine | Admitting: Family Medicine

## 2015-11-22 DIAGNOSIS — M25561 Pain in right knee: Secondary | ICD-10-CM

## 2015-11-22 DIAGNOSIS — M25562 Pain in left knee: Secondary | ICD-10-CM | POA: Insufficient documentation

## 2015-11-22 DIAGNOSIS — E785 Hyperlipidemia, unspecified: Secondary | ICD-10-CM | POA: Insufficient documentation

## 2015-11-22 DIAGNOSIS — F419 Anxiety disorder, unspecified: Secondary | ICD-10-CM | POA: Insufficient documentation

## 2015-11-23 NOTE — Progress Notes (Signed)
Quick Note:  Blood flow scan of the legs is completely normal. This is great news. ______

## 2015-11-24 ENCOUNTER — Ambulatory Visit (INDEPENDENT_AMBULATORY_CARE_PROVIDER_SITE_OTHER): Payer: Self-pay | Admitting: Psychiatry

## 2015-11-24 ENCOUNTER — Encounter (HOSPITAL_COMMUNITY): Payer: Self-pay | Admitting: Psychiatry

## 2015-11-24 VITALS — BP 118/74 | HR 63 | Ht 66.0 in | Wt 116.0 lb

## 2015-11-24 DIAGNOSIS — G4700 Insomnia, unspecified: Secondary | ICD-10-CM

## 2015-11-24 DIAGNOSIS — F3130 Bipolar disorder, current episode depressed, mild or moderate severity, unspecified: Secondary | ICD-10-CM

## 2015-11-24 DIAGNOSIS — F419 Anxiety disorder, unspecified: Secondary | ICD-10-CM

## 2015-11-24 DIAGNOSIS — F310 Bipolar disorder, current episode hypomanic: Secondary | ICD-10-CM

## 2015-11-24 MED ORDER — TRAZODONE HCL 50 MG PO TABS: 50.0000 mg | ORAL_TABLET | Freq: Every day | ORAL | Status: DC

## 2015-11-24 MED ORDER — HYDROXYZINE PAMOATE 25 MG PO CAPS: 25.0000 mg | ORAL_CAPSULE | Freq: Two times a day (BID) | ORAL | Status: DC | PRN

## 2015-11-24 MED ORDER — LAMOTRIGINE 100 MG PO TABS: 100.0000 mg | ORAL_TABLET | Freq: Two times a day (BID) | ORAL | Status: DC

## 2015-11-24 NOTE — Progress Notes (Signed)
Patient ID: Kellie [name], female   DOB: 01-Jul-1955, 60 y.o.   MRN: [id number]   [place] Follow up Outpatient visit  GAO MITNICK [id number] 60 y.o.  11/24/2015 9:35 AM  Chief Complaint:   Follow up for Bipolar  And anxiety.  History of Present Illness:   Patient  Initially presented with racing toughts and cant sleep at night.  Bipolar: lamictal has been helpful there was no rash reported.  She has ran low and has not been on Lamictal for more than a month. She is feeling somewhat moody irritable. States she was unable to afford it and was not able to afford to come back so we did give some referrals just in case but she wants to continue here.  She is advised to start Lamictal 25 mg and gradually build up to 100 mg in the next 3 weeks to understands the risk of having a rash otherwise Sleep: still at timew variable to poor. Takes melatonin or trazadone at night.  Recently endorsing pains and leg pain, that she is deficient in vitamin D and she is getting treatment for this  Modifying factors; she spiritual she feels connected with God. She talks about her kids. Talks to her sister. Aggravating factor: stress, pain Severity of depression; 5 out of 10. 10 being no depression  Context; feeling lonely, worried about her age and finances Medical complexity: back pain and DDD makes her sleep and mood worse. She is going to physical therapy Denies drug, alcohol abuse or use.  Past Psychiatric History/Hospitalization(s) denies  Hospitalization for psychiatric illness: No History of Electroconvulsive Shock Therapy: No Prior Suicide Attempts: No  Medical History; Past Medical History  Diagnosis Date  . Hyperlipidemia   . Allergic rhinitis 11/08/2010  . Osteoporosis 11/21/2011  . Asthma   . Bronchitis   . Anxiety   . Mitral valve prolapse   . Herpes simplex of female genitalia   . Abnormal Pap smear of cervix   . Urticaria     Allergies: Allergies  Allergen  Reactions  . Hydrocodone Nausea And Vomiting  . Codeine Nausea And Vomiting  . Morphine And Related Nausea And Vomiting  . Percocet [Oxycodone-Acetaminophen] Nausea And Vomiting and Other (See Comments)    Patient states it makes blood pressure drop too much  . Vicodin [Hydrocodone-Acetaminophen] Nausea And Vomiting    Medications: Outpatient Encounter Prescriptions as of 11/24/2015  Medication Sig  . acyclovir (ZOVIRAX) 200 MG capsule Take 2 capsules (400 mg total) by mouth 2 (two) times daily.  . Albuterol Sulfate (VENTOLIN HFA IN) Inhale into the lungs.  . ALPRAZolam (XANAX) 0.5 MG tablet TAKE ONE TABLET BY MOUTH AS NEEDED FOR ANXIETY  . baclofen (LIORESAL) 10 MG tablet Take 1 tablet (10 mg total) by mouth at bedtime.  . budesonide (RHINOCORT AQUA) 32 MCG/ACT nasal spray Use 1-2 sprays in each nostril once daily for stuffy nose or drainage.  . diclofenac sodium (VOLTAREN) 1 % GEL Apply 4 g topically 4 (four) times daily. To affected joint.  . famotidine (PEPCID) 10 MG tablet Take 10 mg by mouth 2 (two) times daily.  . fluticasone (FLONASE) 50 MCG/ACT nasal spray One spray in each nostril twice a day, use left hand for right nostril, and right hand for left nostril.  . hydrOXYzine (VISTARIL) 25 MG capsule Take 1 capsule (25 mg total) by mouth 2 (two) times daily as needed.  [name] Kitchen ibuprofen (ADVIL,MOTRIN) 800 MG tablet Take 1 tablet (800 mg total) by mouth  every 8 (eight) hours as needed for mild pain.  [name] Kitchen ipratropium (ATROVENT) 0.06 % nasal spray Place 2 sprays into both nostrils 4 (four) times daily.  [name] Kitchen lamoTRIgine (LAMICTAL) 100 MG tablet Take 1 tablet (100 mg total) by mouth 2 (two) times daily.  [name] Kitchen loratadine (CLARITIN) 10 MG tablet Take 1 tablet (10 mg total) by mouth daily.  . Melatonin 5 MG CAPS Take by mouth.  . meloxicam (MOBIC) 15 MG tablet Take 1 tablet (15 mg total) by mouth daily. For 2weeks then as needed for neck pain.  . Probiotic Product (PROBIOTIC & ACIDOPHILUS EX ST PO) Take  by mouth.  . traZODone (DESYREL) 50 MG tablet Take 1 tablet (50 mg total) by mouth at bedtime.  . [DISCONTINUED] hydrOXYzine (VISTARIL) 25 MG capsule Take 1 capsule (25 mg total) by mouth daily.  . [DISCONTINUED] lamoTRIgine (LAMICTAL) 100 MG tablet Take 1 tablet (100 mg total) by mouth 2 (two) times daily.  . [DISCONTINUED] traZODone (DESYREL) 50 MG tablet Take 1 tablet (50 mg total) by mouth at bedtime.   No facility-administered encounter medications on file as of 11/24/2015.     Family History; Family History  Problem Relation Age of Onset  . Kidney cancer Mother 73  . COPD Mother   . Cancer Mother     renal  . Kidney disease Mother   . Allergic rhinitis Mother   . Asthma Mother   . Heart disease Father   . Allergic rhinitis Father   . COPD Sister   . Depression Sister   . Heart disease Brother   . Heart attack Brother   . Heart disease Paternal Uncle   . Colon cancer Neg Hx   . Rectal cancer Neg Hx   . Stomach cancer Neg Hx   . Colon polyps Neg Hx   . Diabetes      neice  . Thyroid disease      neice  . Depression Maternal Aunt   . Depression Maternal Grandfather        Labs:  Recent Results (from the past 2160 hour(s))  Hepatitis C Antibody     Status: None   Collection Time: 11/07/15  9:57 AM  Result Value Ref Range   HCV Ab NEGATIVE NEGATIVE  Vitamin D (25 hydroxy)     Status: None   Collection Time: 11/07/15  9:57 AM  Result Value Ref Range   Vit D, 25-Hydroxy 30 30 - 100 ng/mL    Comment: Vitamin D Status           25-OH Vitamin D        Deficiency                <20 ng/mL        Insufficiency         20 - 29 ng/mL        Optimal             > or = 30 ng/mL   For 25-OH Vitamin D testing on patients on D2-supplementation and patients for whom quantitation of D2 and D3 fractions is required, the QuestAssureD 25-OH VIT D, (D2,D3), LC/MS/MS is recommended: order code [id number] (patients > 2 yrs).   Magnesium     Status: None   Collection Time: 11/07/15   9:57 AM  Result Value Ref Range   Magnesium 2.1 1.5 - 2.5 mg/dL  COMPLETE METABOLIC PANEL WITH GFR     Status: None   Collection Time: 11/07/15  9:57  AM  Result Value Ref Range   Sodium 140 135 - 146 mmol/L   Potassium 4.2 3.5 - 5.3 mmol/L   Chloride 104 98 - 110 mmol/L   CO2 28 20 - 31 mmol/L   Glucose, Bld 86 65 - 99 mg/dL   BUN 21 7 - 25 mg/dL   Creat 0.73 0.50 - 0.99 mg/dL   Total Bilirubin 0.4 0.2 - 1.2 mg/dL   Alkaline Phosphatase 42 33 - 130 U/L   AST 16 10 - 35 U/L   ALT 11 6 - 29 U/L   Total Protein 6.7 6.1 - 8.1 g/dL   Albumin 4.6 3.6 - 5.1 g/dL   Calcium 9.5 8.6 - 10.4 mg/dL   GFR, Est African American >89 >=60 mL/min   GFR, Est Non African American >89 >=60 mL/min    Comment:   The estimated GFR is a calculation valid for adults (>=25 years old) that uses the CKD-EPI algorithm to adjust for age and sex. It is   not to be used for children, pregnant women, hospitalized patients,    patients on dialysis, or with rapidly changing kidney function. According to the NKDEP, eGFR >89 is normal, 60-89 shows mild impairment, 30-59 shows moderate impairment, 15-29 shows severe impairment and <15 is ESRD.          Musculoskeletal: Strength & Muscle Tone: within normal limits Gait & Station: normal Patient leans: N/A  Mental Status Examination;   Psychiatric Specialty Exam: Physical Exam  Constitutional: She appears well-developed and well-nourished.  Skin: She is not diaphoretic.    Review of Systems  Constitutional: Negative for fever.  Musculoskeletal: Positive for myalgias and back pain.  Skin: Negative for rash.  Neurological: Negative for tremors and headaches.  Psychiatric/Behavioral: Positive for depression. Negative for suicidal ideas. The patient has insomnia.     Blood pressure 118/74, pulse 63, height '5\' 6"'  (1.676 m), weight 116 lb (52.617 kg), SpO2 96 %.Body mass index is 18.73 kg/(m^2).  General Appearance: Casual  Eye Contact::  Fair  Speech:   coherent  Volume:  Normal  Mood: dysphoric  Affect:  congruent  Thought Process: clear . No psychosis  Orientation:  Full (Time, Place, and Person)  Thought Content:  Rumination  Suicidal Thoughts:  No  Homicidal Thoughts:  No  Memory:  Immediate;   Fair Recent;   Fair  Judgement:  Fair  Insight:  Shallow  Psychomotor Activity:  Increased  Concentration:  Fair  Recall:  Fair  Akathisia:  Negative  Handed:  Right  AIMS (if indicated):     Assets:  Desire for Improvement Physical Health Transportation  Sleep:        Assessment: Axis I: bipolar disorder II unspecified or depressed type. Anxiety disorder NOS. Insomnia  Axis II: deferred  Axis III:  Past Medical History  Diagnosis Date  . Hyperlipidemia   . Allergic rhinitis 11/08/2010  . Osteoporosis 11/21/2011  . Asthma   . Bronchitis   . Anxiety   . Mitral valve prolapse   . Herpes simplex of female genitalia   . Abnormal Pap smear of cervix   . Urticaria     Axis IV: psychosocial   Treatment Plan and Summary:  Bipolar : Continue Lamictal 100 mg twice a day (after she has done the slow starter of 48m , explained and says she will cut the tablets) for mood symptoms of bipolar. No rash history reported.  GAD: vistaril prn. Coping skills for anxiety.  Alternate trazadone and vistaril  for sleep. Sometimes she takes melatonin as well. She also has prn xanax seldom takes. Insomnia: sleep hygiene and better manage of pain.  Prescriptions sent.  I continue to  recommend  she follows up with psychotherapy in order to work on her anxiety, insomnia and medication compliance  Pertinent Labs and Relevant Prior Notes reviewed. Medication Side effects, benefits and risks reviewed/discussed with Patient. Time given for patient to respond and asks questions regarding the Diagnosis and Medications. Safety concerns and to report to ER if suicidal or call 911. Relevant Medications refilled or called in to pharmacy.  Follow up  with Primary care provider in regards to Medical conditions/labs follow up and concerns  Recommend compliance with medications and follow up office appointments. Discussed to avail opportunity to consider or/and continue Individual therapy with Counselor. Greater than 50% of time was spend in counseling and coordination of care with the patient.  followup in 2 months.  She wants to come back in 3 months says cannot afford it otherwise.  She can call earlier for any concerns as well.   Time spent: 25 minutes.  Merian Capron, MD 11/24/2015

## 2016-02-10 ENCOUNTER — Other Ambulatory Visit: Payer: Self-pay | Admitting: Physician Assistant

## 2016-02-10 DIAGNOSIS — Z1231 Encounter for screening mammogram for malignant neoplasm of breast: Secondary | ICD-10-CM

## 2016-02-10 MED ORDER — ALPRAZOLAM 0.5 MG PO TABS: ORAL_TABLET | ORAL | 0 refills | Status: DC

## 2016-02-15 ENCOUNTER — Ambulatory Visit (INDEPENDENT_AMBULATORY_CARE_PROVIDER_SITE_OTHER): Payer: No Typology Code available for payment source

## 2016-02-15 DIAGNOSIS — Z1231 Encounter for screening mammogram for malignant neoplasm of breast: Secondary | ICD-10-CM

## 2016-03-09 ENCOUNTER — Encounter: Payer: Self-pay | Admitting: Physician Assistant

## 2016-03-09 ENCOUNTER — Ambulatory Visit (INDEPENDENT_AMBULATORY_CARE_PROVIDER_SITE_OTHER): Payer: No Typology Code available for payment source | Admitting: Physician Assistant

## 2016-03-09 VITALS — BP 105/68 | HR 83 | Ht 66.0 in | Wt 119.0 lb

## 2016-03-09 DIAGNOSIS — Z760 Encounter for issue of repeat prescription: Secondary | ICD-10-CM

## 2016-03-09 DIAGNOSIS — R490 Dysphonia: Secondary | ICD-10-CM

## 2016-03-09 DIAGNOSIS — J301 Allergic rhinitis due to pollen: Secondary | ICD-10-CM

## 2016-03-09 DIAGNOSIS — R0982 Postnasal drip: Secondary | ICD-10-CM

## 2016-03-09 MED ORDER — ACYCLOVIR 200 MG PO CAPS: 400.0000 mg | ORAL_CAPSULE | Freq: Two times a day (BID) | ORAL | 5 refills | Status: DC

## 2016-03-09 MED ORDER — ALPRAZOLAM 0.5 MG PO TABS: ORAL_TABLET | ORAL | 2 refills | Status: DC

## 2016-03-09 MED ORDER — DOXYCYCLINE HYCLATE 100 MG PO TABS: 100.0000 mg | ORAL_TABLET | Freq: Two times a day (BID) | ORAL | 0 refills | Status: DC

## 2016-03-09 NOTE — Patient Instructions (Signed)
switching to allerga d and stop loratadine.

## 2016-03-09 NOTE — Progress Notes (Addendum)
Subjective:     Patient ID: Kellie [name], female   DOB: [birth year], 60 y.o.   MRN: [id number]  HPI Patient is a 60 y.o. Caucasian female presenting today with multiple health complaints. Firstly, the patient notes that she is having some sinus pressure and congestion. The patient states that she gets recurrent sinus infections around every other month. The patient states that her current sinus symptoms have been persistent for the last 2 weeks. The patient notes that she is experiencing intense ear itching, headaches, runny nose, sneezing, and congestion. The patient denies fever, cough, shortness of breath, or chest pain. The patient notes that she has been taking over-the-counter cold medicine with some minor symptomatic relief. Patient states she is also having persistent hoarseness that began 1 year ago. The patient denies any history of tobacco use or second hand exposure, cough, weight loss, or appetite change. Patient also states that she is having some teeth issues and foul taste in her mouth from her inability to see a dentist due to cost.  Patient also requests a two refills for her xanax and valtrex.  Review of Systems  Constitutional: Positive for fatigue. Negative for activity change, appetite change, chills, diaphoresis, fever and unexpected weight change.  HENT: Positive for congestion, dental problem, ear pain (patient notes ear itching and wetness ), postnasal drip, rhinorrhea, sinus pressure, sneezing and voice change. Negative for ear discharge, hearing loss, mouth sores and sore throat.   Respiratory: Negative for cough, chest tightness, shortness of breath and wheezing.   Cardiovascular: Negative for chest pain and palpitations.  Gastrointestinal: Negative for abdominal pain, constipation, diarrhea, nausea and vomiting.  Allergic/Immunologic: Positive for environmental allergies.  Neurological: Positive for dizziness and headaches. Negative for weakness and numbness.       Objective:   Physical Exam  Constitutional: She is oriented to person, place, and time. She appears well-developed and well-nourished. No distress.  HENT:  Head: Normocephalic and atraumatic.  Right Ear: External ear normal.  Left Ear: External ear normal.  Nose: Nose normal.  Mouth/Throat: Oropharynx is clear and moist. No oropharyngeal exudate.  Eyes: Conjunctivae and EOM are normal. Pupils are equal, round, and reactive to light. Right eye exhibits no discharge. Left eye exhibits no discharge. No scleral icterus.  Neck: Normal range of motion. Neck supple. No JVD present. No tracheal deviation present. No thyromegaly present.  Cardiovascular: Normal rate, regular rhythm, normal heart sounds and intact distal pulses.  Exam reveals no gallop and no friction rub.   No murmur heard. Pulmonary/Chest: Effort normal and breath sounds normal. No stridor. No respiratory distress. She has no wheezes. She has no rales. She exhibits no tenderness.  Abdominal: Soft. Bowel sounds are normal. She exhibits no distension and no mass. There is no tenderness. There is no rebound and no guarding.  Lymphadenopathy:    She has no cervical adenopathy.  Neurological: She is alert and oriented to person, place, and time. No cranial nerve deficit. Coordination normal.  Skin: Skin is warm and dry. No rash noted. She is not diaphoretic. No erythema. No pallor.  Psychiatric: She has a normal mood and affect. Her behavior is normal. Judgment and thought content normal.       Assessment:      Kyler was seen today for sinusitis.  Diagnoses and all orders for this visit:  Allergic rhinitis due to pollen  Hoarseness  Medication refill  Other orders -     acyclovir (ZOVIRAX) 200 MG capsule; Take 2 capsules (400  mg total) by mouth 2 (two) times daily. -     ALPRAZolam (XANAX) 0.5 MG tablet; TAKE ONE TABLET BY MOUTH AS NEEDED FOR ANXIETY -     doxycycline (VIBRA-TABS) 100 MG tablet; Take 1 tablet (100 mg  total) by mouth 2 (two) times daily.   Plan:     1. Allergic rhinitis - Patient instructed to take Allergra D daily instead of loratadine. Pt started on Flonase. Patient is aware that her symptoms are most likely secondary to allergies and are not infectious in origin. Patient prescribed doxycycline to be take only if symptoms persist or worsen. Patient to return-to-clinic with new/worsening symptoms.   2. Hoarseness - Patient currently following with ENT specialist. Discussed with patient that she should address this concern with her ENT practitioner. I suspect could be due to chronic post nasal drip/chronic allergies.   3. Medication refill - Patient given refills for acyclovir and alprazolam. Will continue to monitor.

## 2016-05-03 ENCOUNTER — Telehealth: Payer: Self-pay | Admitting: *Deleted

## 2016-05-03 DIAGNOSIS — Z13220 Encounter for screening for lipoid disorders: Secondary | ICD-10-CM

## 2016-05-03 DIAGNOSIS — Z79899 Other long term (current) drug therapy: Secondary | ICD-10-CM

## 2016-05-03 DIAGNOSIS — E559 Vitamin D deficiency, unspecified: Secondary | ICD-10-CM

## 2016-05-03 DIAGNOSIS — R7303 Prediabetes: Secondary | ICD-10-CM

## 2016-05-03 NOTE — Telephone Encounter (Signed)
Labs ordered per pt request

## 2016-05-04 LAB — COMPLETE METABOLIC PANEL WITH GFR
ALT: 16 U/L (ref 6–29)
AST: 20 U/L (ref 10–35)
Albumin: 4.6 g/dL (ref 3.6–5.1)
Alkaline Phosphatase: 44 U/L (ref 33–130)
BUN: 16 mg/dL (ref 7–25)
CALCIUM: 10 mg/dL (ref 8.6–10.4)
CHLORIDE: 101 mmol/L (ref 98–110)
CO2: 29 mmol/L (ref 20–31)
Creat: 0.67 mg/dL (ref 0.50–0.99)
GFR, Est Non African American: >89 mL/min (ref >=60)
Glucose, Bld: 84 mg/dL (ref 65–99)
POTASSIUM: 4.7 mmol/L (ref 3.5–5.3)
Sodium: 140 mmol/L (ref 135–146)
Total Bilirubin: 0.5 mg/dL (ref 0.2–1.2)
Total Protein: 7.1 g/dL (ref 6.1–8.1)

## 2016-05-04 LAB — LIPID PANEL
CHOL/HDL RATIO: 2.4 ratio (ref <5.0)
CHOLESTEROL: 226 mg/dL — AB (ref <200)
HDL: 96 mg/dL (ref >50)
LDL CALC: 120 mg/dL — AB (ref <100)
TRIGLYCERIDES: 48 mg/dL (ref <150)
VLDL: 10 mg/dL (ref <30)

## 2016-05-05 LAB — HEMOGLOBIN A1C
HEMOGLOBIN A1C: 5.5 % (ref <5.7)
Mean Plasma Glucose: 111 mg/dL

## 2016-05-05 LAB — VITAMIN D 25 HYDROXY (VIT D DEFICIENCY, FRACTURES): VIT D 25 HYDROXY: 34 ng/mL (ref 30–100)

## 2016-05-05 LAB — VITAMIN B12: Vitamin B-12: 725 pg/mL (ref 200–1100)

## 2016-07-31 ENCOUNTER — Ambulatory Visit (INDEPENDENT_AMBULATORY_CARE_PROVIDER_SITE_OTHER): Payer: No Typology Code available for payment source | Admitting: Physician Assistant

## 2016-07-31 VITALS — BP 118/74 | HR 81 | Ht 66.0 in | Wt 121.0 lb

## 2016-07-31 DIAGNOSIS — J329 Chronic sinusitis, unspecified: Secondary | ICD-10-CM

## 2016-07-31 DIAGNOSIS — R05 Cough: Secondary | ICD-10-CM

## 2016-07-31 DIAGNOSIS — N644 Mastodynia: Secondary | ICD-10-CM

## 2016-07-31 DIAGNOSIS — J301 Allergic rhinitis due to pollen: Secondary | ICD-10-CM

## 2016-07-31 DIAGNOSIS — L659 Nonscarring hair loss, unspecified: Secondary | ICD-10-CM

## 2016-07-31 DIAGNOSIS — R053 Chronic cough: Secondary | ICD-10-CM

## 2016-07-31 MED ORDER — ALPRAZOLAM 0.5 MG PO TABS: ORAL_TABLET | ORAL | 2 refills | Status: DC

## 2016-07-31 MED ORDER — MUPIROCIN 2 % EX OINT: 1.0000 "application " | TOPICAL_OINTMENT | Freq: Two times a day (BID) | CUTANEOUS | 2 refills | Status: DC

## 2016-07-31 MED ORDER — MONTELUKAST SODIUM 10 MG PO TABS
10.0000 mg | ORAL_TABLET | Freq: Every day | ORAL | 3 refills | Status: DC
Start: 2016-07-31 — End: 2016-09-17

## 2016-07-31 NOTE — Patient Instructions (Signed)
Come back for spironmetry.

## 2016-07-31 NOTE — Progress Notes (Signed)
   Subjective:    Patient ID: Kellie [name], female    DOB: 02/02/1956, 61 y.o.   MRN: [id number]  HPI  Pt is a 61 yo female who presents to the clinic with multiple concerns today.   She is having left breast pain radiating from left axilla into left lower breast and nipple. Present for last 2 months. Denies feeling any masses, nipple discharge or retraction. Normal mammogram in august. No family hx of breast cancer.   Pt is concerned with persistent cough for over a year.  She is convinced it is the mold in her home. Cough is mainly dry. She has had normal CXR in the past and wants a chest CT. She has never smoked. She takes zyrtec daily. She has ongoing sinus pressure. Frequent visits to have abx for sinus infections.   He is concerned about diffuse hair loss. She wants labs.    Review of Systems  All other systems reviewed and are negative.      Objective:   Physical Exam  Constitutional: She is oriented to person, place, and time. She appears well-developed and well-nourished.  HENT:  Head: Normocephalic and atraumatic.  Cardiovascular: Normal rate, regular rhythm and normal heart sounds.   Pulmonary/Chest: Effort normal and breath sounds normal. She has no wheezes.  Neurological: She is alert and oriented to person, place, and time.  Psychiatric: She has a normal mood and affect. Her behavior is normal.          Assessment & Plan:  [name] KitchenMarland KitchenDiagnoses and all orders for this visit:  Hair loss -     TSH -     COMPLETE METABOLIC PANEL WITH GFR -     CBC with Differential/Platelet -     Ferritin -     VITAMIN D 25 Hydroxy (Vit-D Deficiency, Fractures) -     B12  Breast pain, left -     MM DIAG BREAST TOMO BILATERAL; Future -     US BREAST LTD UNI LEFT INC AXILLA; Future -     MM DIAG BREAST TOMO BILATERAL -     US BREAST LTD UNI LEFT INC AXILLA  Recurrent sinusitis -     TSH -     COMPLETE METABOLIC PANEL WITH GFR -     CBC with Differential/Platelet -     Ferritin -      VITAMIN D 25 Hydroxy (Vit-D Deficiency, Fractures) -     B12  Cough, persistent -     CT CHEST W CONTRAST; Future -     CT CHEST W CONTRAST  Acute nonseasonal allergic rhinitis due to pollen  Other orders -     mupirocin ointment (BACTROBAN) 2 %; Place 1 application into the nose 2 (two) times daily. -     montelukast (SINGULAIR) 10 MG tablet; Take 1 tablet (10 mg total) by mouth at bedtime. -     ALPRAZolam (XANAX) 0.5 MG tablet; TAKE ONE TABLET BY MOUTH AS NEEDED FOR ANXIETY   Cough could certainly be due to allergies. Added singulair daily.  Peak flow in yellow.  Encouraged abluterol as needed.  Needs spironmetry.  Ordered CT of chest.

## 2016-08-06 ENCOUNTER — Ambulatory Visit: Payer: Self-pay | Admitting: Physician Assistant

## 2016-08-06 LAB — CBC WITH DIFFERENTIAL/PLATELET
BASOS PCT: 1 %
Basophils Absolute: 42 cells/uL (ref 0–200)
EOS ABS: 84 {cells}/uL (ref 15–500)
Eosinophils Relative: 2 %
HEMATOCRIT: 38.4 % (ref 35.0–45.0)
Hemoglobin: 12.6 g/dL (ref 11.7–15.5)
LYMPHS PCT: 30 %
Lymphs Abs: 1260 cells/uL (ref 850–3900)
MCH: 30.2 pg (ref 27.0–33.0)
MCHC: 32.8 g/dL (ref 32.0–36.0)
MCV: 92.1 fL (ref 80.0–100.0)
MONO ABS: 336 {cells}/uL (ref 200–950)
MPV: 9.9 fL (ref 7.5–12.5)
Monocytes Relative: 8 %
Neutro Abs: 2478 cells/uL (ref 1500–7800)
Neutrophils Relative %: 59 %
Platelets: 328 10*3/uL (ref 140–400)
RBC: 4.17 MIL/uL (ref 3.80–5.10)
RDW: 13.2 % (ref 11.0–15.0)
WBC: 4.2 10*3/uL (ref 3.8–10.8)

## 2016-08-06 LAB — COMPLETE METABOLIC PANEL WITH GFR
ALT: 16 U/L (ref 6–29)
AST: 19 U/L (ref 10–35)
Albumin: 4.6 g/dL (ref 3.6–5.1)
Alkaline Phosphatase: 44 U/L (ref 33–130)
BUN: 16 mg/dL (ref 7–25)
CHLORIDE: 103 mmol/L (ref 98–110)
CO2: 29 mmol/L (ref 20–31)
CREATININE: 0.75 mg/dL (ref 0.50–0.99)
Calcium: 9.7 mg/dL (ref 8.6–10.4)
GFR, Est Non African American: 86 mL/min (ref >=60)
Glucose, Bld: 68 mg/dL (ref 65–99)
Potassium: 4.3 mmol/L (ref 3.5–5.3)
SODIUM: 142 mmol/L (ref 135–146)
Total Bilirubin: 0.4 mg/dL (ref 0.2–1.2)
Total Protein: 6.8 g/dL (ref 6.1–8.1)

## 2016-08-06 LAB — TSH: TSH: 1.64 m[IU]/L

## 2016-08-06 LAB — FERRITIN: Ferritin: 57 ng/mL (ref 20–288)

## 2016-08-06 LAB — VITAMIN B12: VITAMIN B 12: 1253 pg/mL — AB (ref 200–1100)

## 2016-08-07 LAB — VITAMIN D 25 HYDROXY (VIT D DEFICIENCY, FRACTURES): Vit D, 25-Hydroxy: 31 ng/mL (ref 30–100)

## 2016-08-07 NOTE — Progress Notes (Signed)
Vitamin D is normal range but continue vitamin D supplementation at least 2000 units a day with protein meal.   B12 is a little elevated. Cut back a little on supplements.   Kidney, liver, iron stores thyroid look great.

## 2016-08-08 ENCOUNTER — Ambulatory Visit (INDEPENDENT_AMBULATORY_CARE_PROVIDER_SITE_OTHER): Payer: No Typology Code available for payment source

## 2016-08-08 ENCOUNTER — Encounter: Payer: Self-pay | Admitting: Physician Assistant

## 2016-08-08 ENCOUNTER — Ambulatory Visit (INDEPENDENT_AMBULATORY_CARE_PROVIDER_SITE_OTHER): Payer: No Typology Code available for payment source | Admitting: Physician Assistant

## 2016-08-08 VITALS — BP 116/69 | HR 89 | Ht 66.0 in | Wt 119.2 lb

## 2016-08-08 DIAGNOSIS — R0981 Nasal congestion: Secondary | ICD-10-CM

## 2016-08-08 DIAGNOSIS — R300 Dysuria: Secondary | ICD-10-CM

## 2016-08-08 DIAGNOSIS — R05 Cough: Secondary | ICD-10-CM

## 2016-08-08 DIAGNOSIS — R059 Cough, unspecified: Secondary | ICD-10-CM

## 2016-08-08 DIAGNOSIS — R918 Other nonspecific abnormal finding of lung field: Secondary | ICD-10-CM

## 2016-08-08 DIAGNOSIS — J3089 Other allergic rhinitis: Secondary | ICD-10-CM

## 2016-08-08 DIAGNOSIS — J329 Chronic sinusitis, unspecified: Secondary | ICD-10-CM

## 2016-08-08 DIAGNOSIS — R911 Solitary pulmonary nodule: Secondary | ICD-10-CM | POA: Insufficient documentation

## 2016-08-08 LAB — POCT URINALYSIS DIPSTICK
BILIRUBIN UA: NEGATIVE
Blood, UA: NEGATIVE
GLUCOSE UA: NEGATIVE
KETONES UA: NEGATIVE
Leukocytes, UA: NEGATIVE
Nitrite, UA: NEGATIVE
Protein, UA: NEGATIVE
SPEC GRAV UA: 1.015
Urobilinogen, UA: 0.2
pH, UA: 6

## 2016-08-08 LAB — PULMONARY FUNCTION TEST

## 2016-08-08 MED ORDER — ALBUTEROL SULFATE (2.5 MG/3ML) 0.083% IN NEBU: 2.5000 mg | INHALATION_SOLUTION | Freq: Once | RESPIRATORY_TRACT | Status: DC

## 2016-08-08 MED ORDER — ACYCLOVIR 200 MG PO CAPS: 400.0000 mg | ORAL_CAPSULE | Freq: Two times a day (BID) | ORAL | 5 refills | Status: DC

## 2016-08-08 MED ORDER — IOPAMIDOL (ISOVUE-300) INJECTION 61%
100.0000 mL | Freq: Once | INTRAVENOUS | Status: AC | PRN
  Administered 2016-08-08: 100 mL via INTRAVENOUS

## 2016-08-08 NOTE — Progress Notes (Signed)
Call pt: CT showed 2 solid pulmonary nodules. You are consider low risk and nodules do not look concerning. Will recheck in one year.

## 2016-08-08 NOTE — Progress Notes (Signed)
Please send for culture

## 2016-08-08 NOTE — Progress Notes (Addendum)
Subjective:     Patient ID: Kellie [name], female   DOB: 1955/11/06, 61 y.o.   MRN: [id number]  HPI  This is a 61 year old female who presents for spirometry testing today. She also complains of some dysuria for a few days but denies polyuria, urgency, and hematuria. She also is requesting referral to an allergist for allergy testing giving her chronic sinusitis, chronic rhinitis, and chronic congestion.    Review of Systems All other ROS negative except those mentioned in the HPI.    Objective:   Physical Exam  Constitutional: She is oriented to person, place, and time. She appears well-developed and well-nourished.  HENT:  Head: Normocephalic and atraumatic.  Cardiovascular: Normal rate and regular rhythm.  Exam reveals no gallop and no friction rub.   No murmur heard. Pulmonary/Chest: Effort normal and breath sounds normal. She has no wheezes. She has no rales.  Neurological: She is alert and oriented to person, place, and time.  Skin: Skin is warm and dry.  Psychiatric: She has a normal mood and affect. Her behavior is normal.   Urinalysis    Component Value Date/Time   COLORURINE YELLOW 04/28/2014 [place] 04/28/2014 1308   LABSPEC 1.019 04/28/2014 1308   PHURINE 7.0 04/28/2014 1308   GLUCOSEU NEG 04/28/2014 1308   HGBUR NEG 04/28/2014 1308   BILIRUBINUR negative 08/08/2016 0953   KETONESUR NEG 04/28/2014 1308   PROTEINUR negative 08/08/2016 0953   PROTEINUR NEG 04/28/2014 1308   UROBILINOGEN 0.2 08/08/2016 0953   UROBILINOGEN 0.2 04/28/2014 1308   NITRITE Negative 08/08/2016 0953   NITRITE NEG 04/28/2014 1308   LEUKOCYTESUR Negative 08/08/2016 0953   Spirometry normal both pre-test and post-test.     Assessment/Plan:  Kellie [name] was seen today for cough.  Diagnoses and all orders for this visit:  Burning with urination -     POCT urinalysis dipstick -     Urine Culture  Cough -     Spirometry: Pre & Post Eval -     albuterol (PROVENTIL) (2.5  MG/3ML) 0.083% nebulizer solution 2.5 mg; Take 3 mLs (2.5 mg total) by nebulization once. -     Ambulatory referral to Allergy  Chronic allergic rhinitis due to other allergic trigger, unspecified seasonality -     Ambulatory referral to Allergy  Chronic nasal congestion -     Ambulatory referral to Allergy  Recurrent sinusitis -     Ambulatory referral to Allergy   POCT urinalysis negative for leukocytes, nitrites, blood, and bacteria. Urine culture sent out to confirm absence of UTI given dysuria.   Discussed normal spirometry results with patient. FEV1 was 91 percent. FVC was 90 percent. No significant change with albuterol. Reassurance given.    Referred to allergy specialist for allergy testing given chronic sinusitis, nasal congestion, and rhinitis.

## 2016-08-09 LAB — URINE CULTURE: Organism ID, Bacteria: NO GROWTH

## 2016-08-13 ENCOUNTER — Other Ambulatory Visit: Payer: Self-pay

## 2016-08-25 ENCOUNTER — Other Ambulatory Visit: Payer: Self-pay | Admitting: Physician Assistant

## 2016-09-17 ENCOUNTER — Ambulatory Visit (INDEPENDENT_AMBULATORY_CARE_PROVIDER_SITE_OTHER): Payer: No Typology Code available for payment source | Admitting: Physician Assistant

## 2016-09-17 ENCOUNTER — Encounter: Payer: Self-pay | Admitting: Physician Assistant

## 2016-09-17 VITALS — BP 96/61 | HR 67 | Ht 66.0 in | Wt 115.0 lb

## 2016-09-17 DIAGNOSIS — R0981 Nasal congestion: Secondary | ICD-10-CM

## 2016-09-17 DIAGNOSIS — F410 Panic disorder [episodic paroxysmal anxiety] without agoraphobia: Secondary | ICD-10-CM

## 2016-09-17 DIAGNOSIS — R0602 Shortness of breath: Secondary | ICD-10-CM

## 2016-09-17 DIAGNOSIS — F3112 Bipolar disorder, current episode manic without psychotic features, moderate: Secondary | ICD-10-CM

## 2016-09-17 DIAGNOSIS — J301 Allergic rhinitis due to pollen: Secondary | ICD-10-CM

## 2016-09-17 DIAGNOSIS — R0982 Postnasal drip: Secondary | ICD-10-CM

## 2016-09-17 MED ORDER — ARIPIPRAZOLE 10 MG PO TABS: 10.0000 mg | ORAL_TABLET | Freq: Every day | ORAL | 1 refills | Status: DC

## 2016-09-17 MED ORDER — ALPRAZOLAM 0.5 MG PO TABS: ORAL_TABLET | ORAL | 2 refills | Status: DC

## 2016-09-17 MED ORDER — IPRATROPIUM-ALBUTEROL 0.5-2.5 (3) MG/3ML IN SOLN: 3.0000 mL | Freq: Four times a day (QID) | RESPIRATORY_TRACT | 1 refills | Status: DC | PRN

## 2016-09-17 MED ORDER — ARIPIPRAZOLE 10 MG PO TBDP
10.0000 mg | ORAL_TABLET | Freq: Every day | ORAL | 1 refills | Status: DC
Start: 2016-09-17 — End: 2016-09-17

## 2016-09-17 MED ORDER — MONTELUKAST SODIUM 10 MG PO TABS: 10.0000 mg | ORAL_TABLET | Freq: Every day | ORAL | 5 refills | Status: DC

## 2016-09-17 NOTE — Progress Notes (Signed)
   Subjective:    Patient ID: Kellie [name], female    DOB: 07/02/1955, 61 y.o.   MRN: [id number]  HPI  Pt is a 61 yo female who presents to the clinic to discuss mood. She has bipolar I and she feels like she is completely out of control. She can stay up all night. She has so much energy. She is also very aggressive and even has been arrested for violence. lamictal caused her to see spiders. She will not take that but she feels desperate. She started some friends celexa but has not seen any benefit and been on for 3 weeks. She denies any sucidal or homicidal thoughts. She just fills like she can't stop. Her mind is always racing.   She also feels like she cannot get a full deep breath. spirometry was normal in early February this year. She is fixated on pulmonary nodules that were found on CT. She does not want to wait a year for recheck. She does have chronic allergies but have improved since she changed apartments. She takes a daily anti-histamine.        Review of Systems See HPI.     Objective:   Physical Exam  Constitutional: She is oriented to person, place, and time. She appears well-developed and well-nourished.  HENT:  Head: Normocephalic and atraumatic.  Cardiovascular: Normal rate, regular rhythm and normal heart sounds.   Pulmonary/Chest: Effort normal and breath sounds normal. She has no wheezes.  Neurological: She is alert and oriented to person, place, and time.  Skin: Skin is dry.  Psychiatric:  Tearful and hyperverbal. She talked entire encounter and at times cut me off.           Assessment & Plan:  [name] KitchenMarland KitchenDiagnoses and all orders for this visit:  Acute seasonal allergic rhinitis due to pollen -     montelukast (SINGULAIR) 10 MG tablet; Take 1 tablet (10 mg total) by mouth at bedtime.  Bipolar 1 disorder with moderate mania (HCC) -     ARIPiprazole (ABILIFY) 10 MG tablet; Take 1 tablet (10 mg total) by mouth daily. -     Ambulatory referral to  Psychology  Post-nasal drip -     montelukast (SINGULAIR) 10 MG tablet; Take 1 tablet (10 mg total) by mouth at bedtime.  Chronic nasal congestion -     montelukast (SINGULAIR) 10 MG tablet; Take 1 tablet (10 mg total) by mouth at bedtime.  Panic attacks  SOB (shortness of breath) -     ipratropium-albuterol (DUONEB) 0.5-2.5 (3) MG/3ML SOLN; Take 3 mLs by nebulization every 6 (six) hours as needed.  Other orders -     ALPRAZolam (XANAX) 0.5 MG tablet; TAKE ONE TABLET BY MOUTH ONCE DAILY AS NEEDED FOR ANXIETY -     Discontinue: aripiprazole (ABILIFY) 10 MG disintegrating tablet; Take 1 tablet (10 mg total) by mouth daily.   Will start abilify for bipolar discussed she needed to start treatment. HO given and side effects discussed follow up in 1 month. Small quanity of xanax. She lost other bottle. Only for as needed.   For allergies added singulair.   SOB- duoneb given for as needed if having some reactive airway disease with allergies. Reassurance given about pulmonary nodules and follow up. She is aware lung function is normal.

## 2016-09-18 ENCOUNTER — Telehealth: Payer: Self-pay | Admitting: Physician Assistant

## 2016-09-18 ENCOUNTER — Encounter: Payer: Self-pay | Admitting: Physician Assistant

## 2016-09-18 DIAGNOSIS — R0602 Shortness of breath: Secondary | ICD-10-CM | POA: Insufficient documentation

## 2016-09-18 DIAGNOSIS — F410 Panic disorder [episodic paroxysmal anxiety] without agoraphobia: Secondary | ICD-10-CM | POA: Insufficient documentation

## 2016-09-18 NOTE — Telephone Encounter (Signed)
Pt's sister, [name], called to say the Pt started the Abilify rx last night and it made her very "nauseous." Pt went to the bathroom because she "thought was going to vomit" and leaned down on the toilet. The Pt reports she "woke up some time later." Pt states she "doesn't remember losing consciousness but it appears some time passed."   Pt's sister questioned if they could cut the dose in half and continue the Rx. Advised against this. Informed that Pt needs to go to ED for evaluation and not to take the Abilify anymore. Mary verbalized understanding.

## 2016-09-19 ENCOUNTER — Ambulatory Visit: Payer: Self-pay | Admitting: Allergy and Immunology

## 2016-09-19 ENCOUNTER — Telehealth: Payer: Self-pay | Admitting: Emergency Medicine

## 2016-09-19 NOTE — Telephone Encounter (Signed)
Spoke with patient's friend, [name], this morning who reported patient had severe nausea and vomiting after taking new rx of abilify yesterday, and had temporarily blacked out at toilet. I called patient who said she was fine today; we agreed she would not take any more abilify; she made a follow-up appt.to discuss other options with Jade on 09-24-16.

## 2016-09-19 NOTE — Telephone Encounter (Signed)
Can we call and check on patient today?

## 2016-09-19 NOTE — Telephone Encounter (Signed)
Routing to Nurse who spoke with Pt today for update.

## 2016-09-24 ENCOUNTER — Encounter: Payer: Self-pay | Admitting: Physician Assistant

## 2016-09-24 ENCOUNTER — Ambulatory Visit (INDEPENDENT_AMBULATORY_CARE_PROVIDER_SITE_OTHER): Payer: No Typology Code available for payment source | Admitting: Physician Assistant

## 2016-09-24 VITALS — BP 103/61 | HR 80 | Ht 66.0 in | Wt 114.0 lb

## 2016-09-24 DIAGNOSIS — T887XXA Unspecified adverse effect of drug or medicament, initial encounter: Secondary | ICD-10-CM

## 2016-09-24 DIAGNOSIS — T50905A Adverse effect of unspecified drugs, medicaments and biological substances, initial encounter: Secondary | ICD-10-CM

## 2016-09-24 DIAGNOSIS — F3112 Bipolar disorder, current episode manic without psychotic features, moderate: Secondary | ICD-10-CM

## 2016-09-24 MED ORDER — CITALOPRAM HYDROBROMIDE 10 MG PO TABS: 10.0000 mg | ORAL_TABLET | Freq: Every day | ORAL | 5 refills | Status: DC

## 2016-09-25 ENCOUNTER — Encounter: Payer: Self-pay | Admitting: Physician Assistant

## 2016-09-25 DIAGNOSIS — T50905A Adverse effect of unspecified drugs, medicaments and biological substances, initial encounter: Secondary | ICD-10-CM | POA: Insufficient documentation

## 2016-09-25 NOTE — Progress Notes (Signed)
   Subjective:    Patient ID: Kellie [name], female    DOB: 08-Apr-1956, 61 y.o.   MRN: [id number]  HPI Pt is a 61 yo female who presents to the clinic to discuss medication for bipolar. She was started on abilify and as soon as she took first dose she started feeling weak, shaky, dizzy, tingling all over, nausea and vomiting. Lasted for 12 hours and then started to feel better. All symptoms resolved today. She request to start back on celexa. She feels like it has helped in the past.    Review of Systems  All other systems reviewed and are negative.      Objective:   Physical Exam  Constitutional: She is oriented to person, place, and time. She appears well-developed and well-nourished.  HENT:  Head: Normocephalic and atraumatic.  Cardiovascular: Normal rate, regular rhythm and normal heart sounds.   Pulmonary/Chest: Effort normal and breath sounds normal.  Neurological: She is alert and oriented to person, place, and time.  Skin: Skin is dry.  Psychiatric: She has a normal mood and affect. Her behavior is normal.          Assessment & Plan:  [name] KitchenMarland KitchenDiagnoses and all orders for this visit:  Adverse effect of drug, initial encounter  Bipolar 1 disorder with moderate mania ([place]) -     Ambulatory referral to Psychiatry -     Ambulatory referral to Psychology  Other orders -     citalopram (CELEXA) 10 MG tablet; Take 1 tablet (10 mg total) by mouth daily.   Symptoms resolved since stopping Abilify.   Discussed needs referral.  Will call patient and let her know there is a [facility] [place] [facility] [place]. Referral placed.  Restarted celexa. Discussed that is not a preferred medication to treat bipolar and she needs additional therapy. To do think counseling would help as well.

## 2016-09-28 ENCOUNTER — Telehealth: Payer: Self-pay

## 2016-09-28 NOTE — Telephone Encounter (Signed)
Did she restart? What dose? Ok to have her schedule for next week.

## 2016-09-28 NOTE — Telephone Encounter (Signed)
Left VM to get status update. Callback information provided.

## 2016-09-28 NOTE — Telephone Encounter (Signed)
Pt would like to have an EKG scheduled, due to being back on abilify...she said she has had chest pain with it before.

## 2016-10-03 ENCOUNTER — Ambulatory Visit (INDEPENDENT_AMBULATORY_CARE_PROVIDER_SITE_OTHER): Payer: No Typology Code available for payment source | Admitting: Physician Assistant

## 2016-10-03 ENCOUNTER — Encounter: Payer: Self-pay | Admitting: Physician Assistant

## 2016-10-03 VITALS — BP 103/61 | HR 74 | Ht 66.0 in | Wt 115.0 lb

## 2016-10-03 DIAGNOSIS — R531 Weakness: Secondary | ICD-10-CM

## 2016-10-03 LAB — BASIC METABOLIC PANEL
BUN: 16 mg/dL (ref 7–25)
CALCIUM: 9.4 mg/dL (ref 8.6–10.4)
CO2: 25 mmol/L (ref 20–31)
CREATININE: 1.06 mg/dL — AB (ref 0.50–0.99)
Chloride: 104 mmol/L (ref 98–110)
GLUCOSE: 76 mg/dL (ref 65–99)
POTASSIUM: 4.5 mmol/L (ref 3.5–5.3)
Sodium: 140 mmol/L (ref 135–146)

## 2016-10-03 MED ORDER — AMBULATORY NON FORMULARY MEDICATION: 0 refills | Status: DC

## 2016-10-03 NOTE — Progress Notes (Signed)
   Subjective:    Patient ID: Kellie [name], female    DOB: 11-13-1955, 61 y.o.   MRN: [id number]  HPI  Pt is a 62 yo female with hx of bipolar with depression and mania who c/o weakness. She has felt really weak since her reaction to abilify but was feeling it before. Her legs often cramp and feel heavy. At times drinking and eat help her feel better. b12, vitamin D, TSH,  CBC, ferritin were all done and normal within the last 6 months. She is taking celexa and does feel like it is making her calmer. She is sleeping more.    Review of Systems  All other systems reviewed and are negative.      Objective:   Physical Exam  Constitutional: She is oriented to person, place, and time. She appears well-developed and well-nourished.  HENT:  Head: Normocephalic and atraumatic.  Cardiovascular: Normal rate, regular rhythm and normal heart sounds.   Pulmonary/Chest: Effort normal and breath sounds normal.  Neurological: She is alert and oriented to person, place, and time.  Psychiatric: She has a normal mood and affect. Her behavior is normal.          Assessment & Plan:  [name] KitchenMarland KitchenDiagnoses and all orders for this visit:  Weakness -     Basic metabolic panel -     Magnesium  Other orders -     Discontinue: AMBULATORY NON FORMULARY MEDICATION; Glucometer with lancets and test strips to test as needed for hypoglycemia.  #20 strip -     AMBULATORY NON FORMULARY MEDICATION; Glucometer with lancets and test strips to test as needed for hypoglycemia.  #20 strip  unclear etiology. Discussed still close encough out it could be residual muscle tightness from abilify reaction.  Will check some electrolytes. She has had a few low BS. Will give glucometer to check. Make sure eating small frequent meals to avoid any hypoglycema.   Gave referral information again that she need to see psych. Follow up in 4 weeks.

## 2016-10-03 NOTE — Patient Instructions (Signed)
Hypoglycemia  Hypoglycemia is when the sugar (glucose) level in the blood is too low. Symptoms of low blood sugar may include:  Feeling:  Hungry.  Worried or nervous (anxious).  Sweaty and clammy.  Confused.  Dizzy.  Sleepy.  Sick to your stomach (nauseous).  Having:  A fast heartbeat.  A headache.  A change in your vision.  Jerky movements that you cannot control (seizure).  Nightmares.  Tingling or no feeling (numbness) around the mouth, lips, or tongue.  Having trouble with:  Talking.  Paying attention (concentrating).  Moving (coordination).  Sleeping.  Shaking.  Passing out (fainting).  Getting upset easily (irritability). Low blood sugar can happen to people who have diabetes and people who do not have diabetes. Low blood sugar can happen quickly, and it can be an emergency. Treating Low Blood Sugar  Low blood sugar is often treated by eating or drinking something sugary right away. If you can think clearly and swallow safely, follow the 15:15 rule:  Take 15 grams of a fast-acting carb (carbohydrate). Some fast-acting carbs are:  1 tube of glucose gel.  3 sugar tablets (glucose pills).  6-8 pieces of hard candy.  4 oz (120 mL) of fruit juice.  4 oz (120 mL) of regular (not diet) soda.  Check your blood sugar 15 minutes after you take the carb.  If your blood sugar is still at or below 70 mg/dL (3.9 mmol/L), take 15 grams of a carb again.  If your blood sugar does not go above 70 mg/dL (3.9 mmol/L) after 3 tries, get help right away.  After your blood sugar goes back to normal, eat a meal or a snack within 1 hour. Treating Very Low Blood Sugar  If your blood sugar is at or below 54 mg/dL (3 mmol/L), you have very low blood sugar (severe hypoglycemia). This is an emergency. Do not wait to see if the symptoms will go away. Get medical help right away. Call your local emergency services (911 in the [place] Do not drive yourself to the  hospital. If you have very low blood sugar and you cannot eat or drink, you may need a glucagon shot (injection). A family member or friend should learn how to check your blood sugar and how to give you a glucagon shot. Ask your doctor if you need to have a glucagon shot kit at home. Follow these instructions at home: General instructions   Avoid any diets that cause you to not eat enough food. Talk with your doctor before you start any new diet.  Take over-the-counter and prescription medicines only as told by your doctor.  Limit alcohol to no more than 1 drink per day for nonpregnant women and 2 drinks per day for men. One drink equals 12 oz of beer, 5 oz of wine, or 1 oz of hard liquor.  Keep all follow-up visits as told by your doctor. This is important. If You Have Diabetes:    Make sure you know the symptoms of low blood sugar.  Always keep a source of sugar with you, such as:  Sugar.  Sugar tablets.  Glucose gel.  Fruit juice.  Regular soda (not diet soda).  Milk.  Hard candy.  Honey.  Take your medicines as told.  Follow your exercise and meal plan.  Eat on time. Do not skip meals.  Follow your sick day plan when you cannot eat or drink normally. Make this plan ahead of time with your doctor.  Check your blood  often as told by your doctor. Always check before and after exercise. °· Share your diabetes care plan with: °¨ Your work or school. °¨ People you live with. °· Check your pee (urine) for ketones: °¨ When you are sick. °¨ As told by your doctor. °· Carry a card or wear jewelry that says you have diabetes. °If You Have Low Blood Sugar From Other Causes:  °· Check your blood sugar as often as told by your doctor. °· Follow instructions from your doctor about what you cannot eat or drink. °Contact a doctor if: °· You have trouble keeping your blood sugar in your target range. °· You have low blood sugar often. °Get help right away if: °· You still have  symptoms after you eat or drink something sugary. °· Your blood sugar is at or below 54 mg/dL (3 mmol/L). °· You have jerky movements that you cannot control. °· You pass out. °These symptoms may be an emergency. Do not wait to see if the symptoms will go away. Get medical help right away. Call your local emergency services (911 in the [place] Do not drive yourself to the hospital.  °This information is not intended to replace advice given to you by your health care provider. Make sure you discuss any questions you have with your health care provider. °Document Released: 09/05/2009 Document Revised: 11/17/2015 Document Reviewed: 07/15/2015 °Elsevier Interactive Patient Education © 2017 [employer]vier Inc. ° °

## 2016-10-04 LAB — MAGNESIUM: MAGNESIUM: 2.2 mg/dL (ref 1.5–2.5)

## 2016-10-05 DIAGNOSIS — R531 Weakness: Secondary | ICD-10-CM | POA: Insufficient documentation

## 2016-10-11 ENCOUNTER — Telehealth: Payer: Self-pay

## 2016-10-11 ENCOUNTER — Other Ambulatory Visit: Payer: Self-pay | Admitting: Physician Assistant

## 2016-10-11 DIAGNOSIS — R7989 Other specified abnormal findings of blood chemistry: Secondary | ICD-10-CM

## 2016-10-11 LAB — BASIC METABOLIC PANEL
BUN: 11 mg/dL (ref 7–25)
CHLORIDE: 102 mmol/L (ref 98–110)
CO2: 28 mmol/L (ref 20–31)
Calcium: 9.7 mg/dL (ref 8.6–10.4)
Creat: 0.77 mg/dL (ref 0.50–0.99)
Glucose, Bld: 88 mg/dL (ref 65–99)
Potassium: 4.6 mmol/L (ref 3.5–5.3)
Sodium: 139 mmol/L (ref 135–146)

## 2016-10-11 NOTE — Progress Notes (Signed)
Call pt: creatine is back to baseline. Looks great.

## 2016-10-11 NOTE — Telephone Encounter (Signed)
I called to notify patient of lab results, and have many questions from her.   First, she wants to know what could have made her creatinine go up and then come back down.  I tried to explain to her some of the causes, but she wasn't convinced.  Second, she wants to know how often you are going to check it.  There is a question about who she was referred to for psychiatry, but she said she would give it a try.

## 2016-10-11 NOTE — Progress Notes (Signed)
Needs lab slip for creatine recheck.

## 2016-10-12 NOTE — Telephone Encounter (Signed)
Call pt: dehydration, antiinflammatories, increase in blood pressure and even medication reaction can cause elevated creatine. The important thing is that it was acute kidney injury and has resolved.

## 2016-10-19 ENCOUNTER — Encounter: Payer: No Typology Code available for payment source | Attending: Psychology | Admitting: Psychology

## 2016-10-19 DIAGNOSIS — Z8249 Family history of ischemic heart disease and other diseases of the circulatory system: Secondary | ICD-10-CM | POA: Insufficient documentation

## 2016-10-19 DIAGNOSIS — Z8051 Family history of malignant neoplasm of kidney: Secondary | ICD-10-CM | POA: Insufficient documentation

## 2016-10-19 DIAGNOSIS — F603 Borderline personality disorder: Secondary | ICD-10-CM

## 2016-10-19 DIAGNOSIS — Z818 Family history of other mental and behavioral disorders: Secondary | ICD-10-CM | POA: Insufficient documentation

## 2016-10-19 DIAGNOSIS — F319 Bipolar disorder, unspecified: Secondary | ICD-10-CM

## 2016-10-19 DIAGNOSIS — F6089 Other specific personality disorders: Secondary | ICD-10-CM | POA: Insufficient documentation

## 2016-10-19 DIAGNOSIS — Z841 Family history of disorders of kidney and ureter: Secondary | ICD-10-CM | POA: Insufficient documentation

## 2016-10-19 DIAGNOSIS — Z825 Family history of asthma and other chronic lower respiratory diseases: Secondary | ICD-10-CM | POA: Insufficient documentation

## 2016-10-19 DIAGNOSIS — R4587 Impulsiveness: Secondary | ICD-10-CM | POA: Insufficient documentation

## 2016-10-23 NOTE — Progress Notes (Signed)
Neuropsychological Consultation   Patient:   Kellie [name]   DOB:   16-Nov-1955  MR Number:  [id number]  Location:  [facility] [address], [place] [id number] [place] [address] Dept: [phone]           Date of Service:   10/19/2016  Start Time:   8 AM End Time:   9 AM  Provider/Observer:  Ilean Skill, Psy.D.       Clinical Neuropsychologist       Billing Code/Service: Psychological/psychiatric diagnostic clinical interview   Chief Complaint:    The patient reports that she was referred for a psychological/neuropsychological assessment and possibly counseling from [facility] office. The patient reports that she had seen a doctor for years and was told that it was anxiety. However, she does have a previous diagnosis of bipolar disorder as well as significant attentional issues and impulsivity around areas mood changes. There is a strong family history of bipolar disorder.  The patient reports that these symptoms had a significant abdominal her life related to loss of interest in almost everything in these symptoms been going on for a long time.  Reason for Service:  The patient is a 61 year old female referred for psychological/neuropsychological evaluation she describes a long history of mood disturbance and has been diagnosed with bipolar disorder in the past as well as impulsivity and poor mood stability. There is a strong family history of bipolar disorder. She is also been diagnosed with anxiety as well and has concerns about possible attention deficit disorder or other issues related to an attentional issues. The patient reports that she had been treated with Lamictal in the past but developed hallucinations with his medications she reports that she also was prescribed Abilify recently but felt like he simply "knocked her out." She has done okay in the past  with Celexa.  Current Status:  The patient describes moderate to significant symptoms of depression, anxiety, mood changes, appetite disturbance, sleep changes, work problems, racing thoughts and insomnia, confusion and memory problems, loss of interest, irritability, excessive worrying, obsessive thinking, poor concentration and ritualistic almost compulsive types of behaviors.  Reliability of Information: Information is derived from 1 hour face-to-face clinical interview with the patient as well as review of available medical records.  Behavioral Observation: Kellie [name]  presents as a 61 y.o.-year-old Right  handed Female who appeared her stated age. her dress was Appropriate and she was Well Groomed and her manners were Appropriate to the situation.  her participation was indicative of Appropriate behaviors.  There were not any physical disabilities noted.  she displayed an appropriate level of cooperation and motivation.     Interactions:    Active Monopolizing and Redirectable  Attention:   The patient appeared to be distracted by some internal preoccupations at times but otherwise was able to maintain attention and focus. and attention span appeared shorter than expected for age  Memory:   within normal limits; recent and remote memory intact  Visuo-spatial:  within normal limits  Speech (Volume):  normal  Speech:   normal;   Thought Process:  Coherent  Though Content:  WNL; not suicidal  Orientation:   person, place, time/date and situation  Judgment:   Fair  Planning:   Fair  Affect:    Anxious  Mood:    Anxious  Insight:   Fair  Intelligence:   normal  Marital Status/Living: The patient  was born in [place] and grew up in [place].  The patient is single and has one 82-year-old son. She lives by herself.  Current Employment: The patient is not currently working.  Past Employment:  The  patient has not worked for 9 years.  Substance Use:  No concerns of substance abuse are reported.    Education:   The patient completed the ninth grade but has no formal education beyond that.  Medical History:   Past Medical History:  Diagnosis Date  . Abnormal Pap smear of cervix   . Allergic rhinitis 11/08/2010  . Anxiety   . Asthma   . Bronchitis   . Herpes simplex of female genitalia   . Hyperlipidemia   . Mitral valve prolapse   . Osteoporosis 11/21/2011  . Urticaria        @problemlist @       Abuse/Trauma History:   Psychiatric History:  The patient does have an extensive psychiatric history of some form of the other but has had no formal interventions prior to 2015. She was treated starting in 2015 at [facility] by Dr. Valente David  Family Med/Psych History:  Family History  Problem Relation Age of Onset  . Kidney cancer Mother 46  . COPD Mother   . Cancer Mother     renal  . Kidney disease Mother   . Allergic rhinitis Mother   . Asthma Mother   . Heart disease Father   . Allergic rhinitis Father   . COPD Sister   . Depression Sister   . Heart disease Brother   . Heart attack Brother   . Heart disease Paternal Uncle   . Colon cancer Neg Hx   . Rectal cancer Neg Hx   . Stomach cancer Neg Hx   . Colon polyps Neg Hx   . Diabetes      neice  . Thyroid disease      neice  . Depression Maternal Aunt   . Depression Maternal Grandfather     Risk of Suicide/Violence: low the patient denies any suicidal or homicidal ideation  Impression/DX:  The patient was referred for psychological evaluation are in issues concerning attentional issues and impulsivity. She was concerned about the possibility of attention deficit disorder. However, she does have a long history of diagnosis of bipolar disorder or the existence of bipolar disorder symptoms. There try to do various interventions with her including Lamictal and Abilify. The patient reports that she  experiences hallucinations with Lamictal and Abilify caused her to become extremely lethargic and essentially "knocked her out." The patient reports that she does okay with Celexa. I do not think that this symptoms and conditions are correlated with attention deficit disorder and I do not think that she should be tried on a psychostimulant medication. While I will continue to work with the patient I do think that we should consider another trial of the specific mood stabilizing agent such as lithium. The patient describes being very reactive to various psychotropic medications and if medications such as lithium is try to be titrated up very slowly. I will continue to look at various psychological/psychiatric issues but I do think the best diagnosis be one of a bipolar disorder as well as an impulsive personality disorder. The patient describes a lifelong consistent difficulty with being very impulsive and intrusive in various situations that it caused her difficulties.  Disposition/Plan:  We will work with her current treating  and referring doctors about more appropriate and targeted interventions.  Diagnosis:    Bipolar I disorder ([place])  Impulsive personality disorder         Electronically Signed   _______________________ Ilean Skill, Psy.D.

## 2016-10-25 ENCOUNTER — Telehealth: Payer: Self-pay | Admitting: Psychology

## 2016-10-25 NOTE — Telephone Encounter (Signed)
Kellie [name] is following up with you, she hasn't heard anything back from you in reference to [facility] in [place] for some testing.  Please call her at [phone].

## 2016-12-05 ENCOUNTER — Ambulatory Visit (INDEPENDENT_AMBULATORY_CARE_PROVIDER_SITE_OTHER): Payer: Self-pay | Admitting: Licensed Clinical Social Worker

## 2016-12-05 ENCOUNTER — Telehealth: Payer: Self-pay | Admitting: Physician Assistant

## 2016-12-05 DIAGNOSIS — F310 Bipolar disorder, current episode hypomanic: Secondary | ICD-10-CM

## 2016-12-05 DIAGNOSIS — Z780 Asymptomatic menopausal state: Secondary | ICD-10-CM

## 2016-12-05 DIAGNOSIS — Z13820 Encounter for screening for osteoporosis: Secondary | ICD-10-CM

## 2016-12-05 NOTE — Telephone Encounter (Signed)
Patient is requesting a bone density test. She said she will be downstairs today until 11am if you could try to order that before she leaves the area. I informed her you were seeing patients and may not have time to order it while she is still here. Please advise. Thanks!

## 2016-12-05 NOTE — Telephone Encounter (Signed)
Order placed
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
## 2016-12-12 ENCOUNTER — Ambulatory Visit (INDEPENDENT_AMBULATORY_CARE_PROVIDER_SITE_OTHER): Payer: No Typology Code available for payment source

## 2016-12-12 ENCOUNTER — Ambulatory Visit (INDEPENDENT_AMBULATORY_CARE_PROVIDER_SITE_OTHER): Payer: Self-pay | Admitting: Licensed Clinical Social Worker

## 2016-12-12 DIAGNOSIS — F310 Bipolar disorder, current episode hypomanic: Secondary | ICD-10-CM

## 2016-12-12 DIAGNOSIS — M810 Age-related osteoporosis without current pathological fracture: Secondary | ICD-10-CM

## 2016-12-12 NOTE — Progress Notes (Signed)
   THERAPIST PROGRESS NOTE  Session Time: 10 AM to 11 AM  Participation Level: Active  Behavioral Response: CasualAlertEuthymic and Hypomanic  Type of Therapy: Individual Therapy  Treatment Goals addressed:  patient will learn coping strategies and work on attitudes that will help her in stabilizing mood  Interventions: CBT, Motivational Interviewing, Solution Focused, Strength-based, Supportive and Other: Psychoeducation for best practices for treating bipolar, completed treatment plan  Summary: Kellie [name] is a 61 y.o. female who presents discussing option she would prefer for addressing symptoms that include diet, food,and yoga. Recognizes that she knows she needs medicine but acknowledged feeling afraid and expressed some of her concerns about taking medicine. Shared that she realizes she would like to be able to do it her way but is open to recommendation to see psychiatrist. Described her strengths and ways that she is able to function well including thinking a plan through, her common sense and she is the one family turns to to help with their problems. Completed treatment plan and shared that she needs to work on "not going off or offending people". She will work on Radiographer, therapeutic and attitude to help her in stabilizing mood in treatment. Recognizes that symptoms are interfering with her sleep as well.  Discussed coping and attitudes that are helpful for patient and she related she is a positive person, she is strong, God and her son keep her strong, she falls and gets back up. Related that "I am content" but at the same time realizes she has issues to work on in treatment. Relates that she writes notes every day to help her in completing tasks. Reviewed 3 months ago where symptoms increase and tied this to having to move out of an apartment where she lived for 6 years. Reviewed session and patient relates that she is going to work on not going off and taking things calmer and recognition  that medications could help with this.   Suicidal/Homicidal: No  Therapist Response: Reviewed patient's progress and symptoms and identified ways patient is functioning well but also ways that symptoms are interfering with functioning specifically quick to go off on people if triggered and not sleeping. Reviewed therapists recommendation for best practices that include medication management and therapy and provided positive feedback for patient's insight and willingness to be referred for medication management. Discussed attitudes that are helpful in coping including that things don't happen all at once but take time to complete goals. Explored triggers that led to escalation of symptoms 3 months ago to help patient build insight to effective ways of coping. Completed treatment plan. Provided strength based and supportive interventions.  Patient asks for therapist notes and therapist will have available for patient to pick up. Plan: Return again in 1-2 months once patient has seen psychiatrist.2. Therapist work with patient on coping strategies that help her in management of symptoms and stabilizing mood  Diagnosis: Axis I:  bipolar 1 disorder, current episode hypomanic    Axis II: No diagnosis    Cordella Register, LCSW 12/12/2016

## 2016-12-12 NOTE — Telephone Encounter (Signed)
Call pt: T score was -2.9 and consider osteoporotic. You meet requirements for prescription medications. Would you like oral or injections. You might want to come in and discuss risk and benefits of different medications

## 2016-12-17 ENCOUNTER — Emergency Department (HOSPITAL_BASED_OUTPATIENT_CLINIC_OR_DEPARTMENT_OTHER)
Admission: EM | Admit: 2016-12-17 | Discharge: 2016-12-17 | Disposition: A | Discharge status: Home / Self Care | Payer: Self-pay | Attending: Emergency Medicine | Admitting: Emergency Medicine

## 2016-12-17 ENCOUNTER — Encounter (HOSPITAL_BASED_OUTPATIENT_CLINIC_OR_DEPARTMENT_OTHER): Payer: Self-pay | Admitting: Emergency Medicine

## 2016-12-17 ENCOUNTER — Emergency Department (HOSPITAL_BASED_OUTPATIENT_CLINIC_OR_DEPARTMENT_OTHER): Payer: Self-pay

## 2016-12-17 DIAGNOSIS — E119 Type 2 diabetes mellitus without complications: Secondary | ICD-10-CM | POA: Insufficient documentation

## 2016-12-17 DIAGNOSIS — R0789 Other chest pain: Secondary | ICD-10-CM | POA: Insufficient documentation

## 2016-12-17 DIAGNOSIS — R42 Dizziness and giddiness: Secondary | ICD-10-CM

## 2016-12-17 DIAGNOSIS — R079 Chest pain, unspecified: Secondary | ICD-10-CM

## 2016-12-17 DIAGNOSIS — J45909 Unspecified asthma, uncomplicated: Secondary | ICD-10-CM | POA: Insufficient documentation

## 2016-12-17 DIAGNOSIS — R197 Diarrhea, unspecified: Secondary | ICD-10-CM

## 2016-12-17 DIAGNOSIS — E860 Dehydration: Secondary | ICD-10-CM | POA: Insufficient documentation

## 2016-12-17 DIAGNOSIS — Z79899 Other long term (current) drug therapy: Secondary | ICD-10-CM | POA: Insufficient documentation

## 2016-12-17 LAB — COMPREHENSIVE METABOLIC PANEL
ALBUMIN: 4.2 g/dL (ref 3.5–5.0)
ALK PHOS: 42 U/L (ref 38–126)
ALT: 14 U/L (ref 14–54)
ANION GAP: 6 (ref 5–15)
AST: 19 U/L (ref 15–41)
BUN: 18 mg/dL (ref 6–20)
CALCIUM: 9.5 mg/dL (ref 8.9–10.3)
CO2: 30 mmol/L (ref 22–32)
CREATININE: 0.85 mg/dL (ref 0.44–1.00)
Chloride: 103 mmol/L (ref 101–111)
GFR calc Af Amer: >60 mL/min (ref >60)
GFR calc non Af Amer: >60 mL/min (ref >60)
GLUCOSE: 82 mg/dL (ref 65–99)
Potassium: 4.3 mmol/L (ref 3.5–5.1)
SODIUM: 139 mmol/L (ref 135–145)
Total Bilirubin: 0.5 mg/dL (ref 0.3–1.2)
Total Protein: 7.1 g/dL (ref 6.5–8.1)

## 2016-12-17 LAB — CBC WITH DIFFERENTIAL/PLATELET
BASOS PCT: 1 %
Basophils Absolute: 0 10*3/uL (ref 0.0–0.1)
Eosinophils Absolute: 0.1 10*3/uL (ref 0.0–0.7)
Eosinophils Relative: 2 %
HEMATOCRIT: 37.8 % (ref 36.0–46.0)
Hemoglobin: 12.8 g/dL (ref 12.0–15.0)
LYMPHS ABS: 0.9 10*3/uL (ref 0.7–4.0)
Lymphocytes Relative: 22 %
MCH: 31.3 pg (ref 26.0–34.0)
MCHC: 33.9 g/dL (ref 30.0–36.0)
MCV: 92.4 fL (ref 78.0–100.0)
MONO ABS: 0.4 10*3/uL (ref 0.1–1.0)
MONOS PCT: 9 %
NEUTROS ABS: 2.8 10*3/uL (ref 1.7–7.7)
Neutrophils Relative %: 66 %
Platelets: 286 10*3/uL (ref 150–400)
RBC: 4.09 MIL/uL (ref 3.87–5.11)
RDW: 12.6 % (ref 11.5–15.5)
WBC: 4.2 10*3/uL (ref 4.0–10.5)

## 2016-12-17 LAB — URINALYSIS, ROUTINE W REFLEX MICROSCOPIC
Bilirubin Urine: NEGATIVE
Glucose, UA: NEGATIVE mg/dL
Hgb urine dipstick: NEGATIVE
KETONES UR: NEGATIVE mg/dL
LEUKOCYTES UA: NEGATIVE
Nitrite: NEGATIVE
PROTEIN: NEGATIVE mg/dL
Specific Gravity, Urine: 1.016 (ref 1.005–1.030)
pH: 6.5 (ref 5.0–8.0)

## 2016-12-17 LAB — MAGNESIUM: Magnesium: 2.2 mg/dL (ref 1.7–2.4)

## 2016-12-17 LAB — TSH: TSH: 1.357 u[IU]/mL (ref 0.350–4.500)

## 2016-12-17 LAB — TROPONIN I: Troponin I: <0.03 ng/mL (ref <0.03)

## 2016-12-17 LAB — LIPASE, BLOOD: Lipase: 40 U/L (ref 11–51)

## 2016-12-17 LAB — D-DIMER, QUANTITATIVE: D-Dimer, Quant: 0.93 ug/mL-FEU — ABNORMAL HIGH (ref 0.00–0.50)

## 2016-12-17 MED ORDER — IOPAMIDOL (ISOVUE-370) INJECTION 76%
100.0000 mL | Freq: Once | INTRAVENOUS | Status: AC | PRN
  Administered 2016-12-17: 100 mL via INTRAVENOUS

## 2016-12-17 MED ORDER — SODIUM CHLORIDE 0.9 % IV BOLUS (SEPSIS)
1000.0000 mL | Freq: Once | INTRAVENOUS | Status: AC
  Administered 2016-12-17: 1000 mL via INTRAVENOUS

## 2016-12-17 NOTE — ED Notes (Signed)
Patient transported to CT 

## 2016-12-17 NOTE — Discharge Instructions (Signed)
Your workup today did not show evidence of infection. Your CT scan did not show evidence of pneumonia or other lung problem. We suspect your lightheaded spells are due to dehydration in the setting of your diarrhea. Please stay hydrated. Please follow-up with your PCP for results of the thyroid test and follow-up for further management. If any symptoms change or worsen, please return to the nearest emergency department.

## 2016-12-17 NOTE — ED Notes (Signed)
Pt understood discharge instructions.  She understood AVS instructions.

## 2016-12-17 NOTE — ED Notes (Signed)
Patient transported to X-ray 

## 2016-12-17 NOTE — ED Provider Notes (Signed)
[facility] DEPT [facility] Provider Note   CSN: [id number] Arrival date & time: 12/17/16  1031     History   Chief Complaint Chief Complaint  Patient presents with  . Chest Pain    HPI Kellie [name] is a 61 y.o. female.  The history is provided by the patient and medical records.  Chest Pain   This is a new problem. The current episode started more than 1 week ago. The problem occurs every several days. The problem has been resolved. The pain is present in the substernal region. The pain is moderate. The quality of the pain is described as brief and sharp. The pain does not radiate. Associated symptoms include shortness of breath. Pertinent negatives include no abdominal pain, no back pain, no cough, no diaphoresis, no dizziness, no exertional chest pressure, no fever, no headaches, no irregular heartbeat, no leg pain, no lower extremity edema, no nausea, no palpitations, no syncope, no vomiting and no weakness. She has tried nothing for the symptoms. The treatment provided no relief.  Pertinent negatives for past medical history include no CAD and no seizures.    Past Medical History:  Diagnosis Date  . Abnormal Pap smear of cervix   . Allergic rhinitis 11/08/2010  . Anxiety   . Asthma   . Bronchitis   . Diabetes mellitus without complication ([place])   . Herpes simplex of female genitalia   . Hyperlipidemia   . Mitral valve prolapse   . Osteoporosis 11/21/2011  . Urticaria     Patient Active Problem List   Diagnosis Date Noted  . Weakness 10/05/2016  . Drug reaction 09/25/2016  . Panic attacks 09/18/2016  . SOB (shortness of breath) 09/18/2016  . Bipolar 1 disorder with moderate mania ([place]) 09/17/2016  . Pulmonary nodule 08/08/2016  . Hoarseness 03/09/2016  . Left knee pain 11/09/2015  . Pain in joint, lower leg 11/09/2015  . Plantar fasciitis of left foot 11/09/2015  . Cough, persistent 07/21/2015  . Post-nasal drip 07/21/2015  . Radiculitis of right cervical region  07/06/2015  . DDD (degenerative disc disease), cervical 07/05/2015  . Chronic nasal congestion 03/01/2015  . Vitreous floaters of right eye 03/01/2015  . Near syncope 02/09/2015  . Sinusitis 02/09/2015  . Rhinitis, allergic 10/14/2014  . Family history of renal cancer 10/11/2014  . DDD (degenerative disc disease), lumbar 10/11/2014  . Bipolar disorder with depression ([place]) 06/02/2014  . Postmenopausal atrophic vaginitis 03/17/2014  . Prediabetes 12/14/2013  . Adhesive capsulitis of right shoulder 11/24/2013  . Chronic periodontal disease 06/30/2013  . Genital herpes 11/05/2012  . Hemorrhoids 11/05/2012  . Borderline intellectual functioning 02/15/2012  . Osteoporosis 11/21/2011  . Allergic rhinitis 11/08/2010  . Insomnia 11/08/2010  . GERD (gastroesophageal reflux disease) 09/27/2010    Past Surgical History:  Procedure Laterality Date  . CLOSED MANIPULATION SHOULDER  7&01/2005   x2, 30 days apart  . CLOSED MANIPULATION SHOULDER  2006   Left  . COLONOSCOPY  09/2012   Medium sized external hemorrhoids, few small divertic in left colon, otherwise normal colon (repeat 10 yrs)  . ESOPHAGOGASTRODUODENOSCOPY  09/2012   Normal (Dr. Ardis Hughs)  . LEEP  11/2004    OB History    Gravida Para Term Preterm AB Living   1 1 1     1    SAB TAB Ectopic Multiple Live Births                   Home Medications    Prior to Admission  medications   Medication Sig Start Date End Date Taking? Authorizing Provider  acyclovir (ZOVIRAX) 200 MG capsule Take 2 capsules (400 mg total) by mouth 2 (two) times daily. 08/08/16  Yes Breeback, Jade L, PA-C  ALPRAZolam (XANAX) 0.5 MG tablet TAKE ONE TABLET BY MOUTH ONCE DAILY AS NEEDED FOR ANXIETY 09/17/16  Yes Breeback, Jade L, PA-C  cetirizine (ZYRTEC) 10 MG tablet Take 10 mg by mouth daily.   Yes [provider]  Cholecalciferol (VITAMIN D) 2000 units CAPS Take by mouth.   Yes [provider]  citalopram (CELEXA) 10 MG tablet Take 1 tablet  (10 mg total) by mouth daily. 09/24/16  Yes Breeback, Jade L, PA-C  famotidine (PEPCID) 10 MG tablet Take 10 mg by mouth 2 (two) times daily.   Yes [provider]  ipratropium-albuterol (DUONEB) 0.5-2.5 (3) MG/3ML SOLN Take 3 mLs by nebulization every 6 (six) hours as needed. 09/17/16  Yes Breeback, Jade L, PA-C  montelukast (SINGULAIR) 10 MG tablet Take 1 tablet (10 mg total) by mouth at bedtime. 09/17/16  Yes Breeback, Jade L, PA-C  mupirocin ointment (BACTROBAN) 2 % Place 1 application into the nose 2 (two) times daily. 07/31/16  Yes Breeback, Jade L, PA-C  Probiotic Product (PROBIOTIC & ACIDOPHILUS EX ST PO) Take by mouth.   Yes [provider]  AMBULATORY NON FORMULARY MEDICATION Glucometer with lancets and test strips to test as needed for hypoglycemia.  #20 strip 10/03/16   Donella Stade, PA-C    Family History Family History  Problem Relation Age of Onset  . Kidney cancer Mother 106  . COPD Mother   . Cancer Mother        renal  . Kidney disease Mother   . Allergic rhinitis Mother   . Asthma Mother   . Heart disease Father   . Allergic rhinitis Father   . COPD Sister   . Depression Sister   . Heart disease Brother   . Heart attack Brother   . Heart disease Paternal Uncle   . Diabetes Unknown        neice  . Thyroid disease Unknown        neice  . Depression Maternal Aunt   . Depression Maternal Grandfather   . Colon cancer Neg Hx   . Rectal cancer Neg Hx   . Stomach cancer Neg Hx   . Colon polyps Neg Hx     Social History Social History  Substance Use Topics  . Smoking status: Never Smoker  . Smokeless tobacco: Never Used  . Alcohol use No     Allergies   Hydrocodone; Abilify [aripiprazole]; Codeine; Morphine and related; Percocet [oxycodone-acetaminophen]; and Vicodin [hydrocodone-acetaminophen]   Review of Systems Review of Systems  Constitutional: Positive for fatigue. Negative for chills, diaphoresis and fever.  Eyes: Negative for  visual disturbance.  Respiratory: Positive for shortness of breath. Negative for cough, chest tightness, wheezing and stridor.   Cardiovascular: Positive for chest pain. Negative for palpitations and syncope.  Gastrointestinal: Negative for abdominal pain, constipation, diarrhea, nausea and vomiting.  Genitourinary: Positive for frequency. Negative for flank pain.  Musculoskeletal: Negative for back pain, gait problem, neck pain and neck stiffness.  Skin: Negative for rash and wound.  Neurological: Positive for light-headedness. Negative for dizziness, seizures, weakness and headaches.  Psychiatric/Behavioral: Negative for agitation.  All other systems reviewed and are negative.    Physical Exam Updated Vital Signs BP 98/64   Pulse 67   Resp 17   Ht  5\' 6"  (1.676 m)   Wt 49.9 kg (110 lb)   SpO2 97%   BMI 17.75 kg/m   Physical Exam  Constitutional: She is oriented to person, place, and time. She appears well-developed and well-nourished. No distress.  HENT:  Head: Normocephalic and atraumatic.  Right Ear: External ear normal.  Left Ear: External ear normal.  Nose: Nose normal.  Mouth/Throat: Oropharynx is clear and moist. No oropharyngeal exudate.  Eyes: Conjunctivae and EOM are normal. Pupils are equal, round, and reactive to light.  Neck: Normal range of motion. Neck supple.  Cardiovascular: Normal rate and intact distal pulses.   No murmur heard. Pulmonary/Chest: Effort normal. No stridor. No respiratory distress. She has no wheezes. She has no rales. She exhibits no tenderness.  Abdominal: She exhibits no distension. There is no tenderness. There is no rebound.  Musculoskeletal: She exhibits no tenderness.  Lymphadenopathy:    She has no cervical adenopathy.  Neurological: She is alert and oriented to person, place, and time. She has normal reflexes. She displays normal reflexes. No cranial nerve deficit or sensory deficit. She exhibits normal muscle tone. Coordination  normal.  Skin: Skin is warm. Capillary refill takes less than 2 seconds. No rash noted. She is not diaphoretic. No erythema.  Psychiatric: She has a normal mood and affect.  Nursing note and vitals reviewed.    ED Treatments / Results  Labs (all labs ordered are listed, but only abnormal results are displayed) Labs Reviewed  D-DIMER, QUANTITATIVE (NOT AT [facility]) - Abnormal; Notable for the following:       Result Value   D-Dimer, Quant 0.93 (*)    All other components within normal limits  URINE CULTURE  COMPREHENSIVE METABOLIC PANEL  CBC WITH DIFFERENTIAL/PLATELET  TROPONIN I  LIPASE, BLOOD  MAGNESIUM  TSH  URINALYSIS, ROUTINE W REFLEX MICROSCOPIC    EKG  EKG Interpretation  Date/Time:  Monday December 17 2016 10:38:41 EDT Ventricular Rate:  77 PR Interval:  162 QRS Duration: 90 QT Interval:  374 QTC Calculation: 423 R Axis:   82 Text Interpretation:  Normal sinus rhythm Normal ECG when compared to prior, no significant chcanges seen.  no STEMI Confirmed by Antony Blackbird [phone]) on 12/17/2016 10:50:38 AM       Radiology Dg Chest 2 View  Result Date: 12/17/2016 CLINICAL DATA:  Intermittent chest pain and shortness of breath for the past week. History of asthma, diabetes, mitral valve prolapse. EXAM: CHEST  2 VIEW COMPARISON:  CT chest of August 08, 2016 and chest x-ray of December 14, 2013 FINDINGS: The lungs are mildly hyperinflated. There is no focal infiltrate, pneumothorax, or pleural effusion. The heart and pulmonary vascularity are normal. The mediastinum is normal in width. The bony thorax exhibits no acute abnormality. IMPRESSION: Mild hyperinflation consistent with reactive airway disease. No pneumonia, CHF, nor other acute cardiopulmonary abnormality. Electronically Signed   By: David  Martinique M.D.   On: 12/17/2016 12:02   Ct Angio Chest Pe W And/or Wo Contrast  Result Date: 12/17/2016 CLINICAL DATA:  Chest pain, elevated D-dimer EXAM: CT ANGIOGRAPHY CHEST WITH  CONTRAST TECHNIQUE: Multidetector CT imaging of the chest was performed using the standard protocol during bolus administration of intravenous contrast. Multiplanar CT image reconstructions and MIPs were obtained to evaluate the vascular anatomy. CONTRAST:  100 cc Isovue 370 IV COMPARISON:  08/08/2006 FINDINGS: Cardiovascular: No filling defects in the pulmonary arteries to suggest pulmonary emboli. Heart is normal size. Aorta is normal caliber. Mediastinum/Nodes: No mediastinal, hilar, or axillary  adenopathy. Trachea and esophagus are unremarkable. Lungs/Pleura: Lungs are clear. No focal airspace opacities or suspicious nodules. No effusions. Upper Abdomen: Imaging into the upper abdomen shows no acute findings. Musculoskeletal: Chest wall soft tissues are unremarkable. No acute bony abnormality. Review of the MIP images confirms the above findings. IMPRESSION: No evidence of pulmonary embolus. No acute findings. Electronically Signed   By: Rolm Baptise M.D.   On: 12/17/2016 13:36    Procedures Procedures (including critical care time)  Medications Ordered in ED Medications  sodium chloride 0.9 % bolus 1,000 mL (0 mLs Intravenous Stopped 12/17/16 1419)  iopamidol (ISOVUE-370) 76 % injection 100 mL (100 mLs Intravenous Contrast Given 12/17/16 1324)     Initial Impression / Assessment and Plan / ED Course  I have reviewed the triage vital signs and the nursing notes.  Pertinent labs & imaging results that were available during my care of the patient were reviewed by me and considered in my medical decision making (see chart for details).      Kellie [name] is a 61 y.o. female A past medical history significant for asthma, hyperlipidemia, diabetes, anxiety, and bipolar disorder who presents with one month of multiple complaints including chest pain, lightheadedness, diarrhea, fatigue, urinary frequency, and low back pain. Patient reports that she has had all her symptoms for the last month but have  been slowly worsening. She reports feeling very fatigued. She says that she has had urinary frequency and has had some foul smelling urine. She denies dysuria. She says that she is having electrical shooting pain in her chest. She reports that last for several seconds and is not persistent. She feels that several times per week. She says that it may be related to anxiety. She also reports that she has had diarrhea every few days for the last several weeks which she attributes to her Celexa use. Is unsure if she is drinking in fluids to compensate. She reports losing several pounds in the last month.  She reports some mild shortness of breath with her chest pain and fatigue. She says she is currently chest pain free. She reports chronic low back pain which is unchanged from prior. She denies any other symptoms.  History and exam are seen above. On exam, low back is nontender. Lungs are clear. Chest is nontender. Abdomen nontender. No focal neurologic deficits.   Based on symptoms, suspect dehydration secondary to chronic diarrhea from medications. Patient will have laboratory in imaging testing given her age chest pain. EKG appeared unchanged from prior. Chest x-ray shows no pneumonia.  EKG negative. D dimer elevated, CT-PEstudy ordered and was negative. Patient had resolution of prior nodules. Laboratory testing otherwise reassuring.  Suspect dehydration leading to her fatigue and lightheadeds. Musculoskeletal pain considered for her chest pain. Pain does not seem consistent with a cardiac or pulmonary etiology. Patient reports significant improvement in symptoms after fluids.  Patient given instructions to stay hydrated to prevent lightheadedness. Patient instructed to follow up with her PCP for further workup and management of her symptoms as well as follow-up of the lab results that have not returned including TSH. After discharge, TSH return normal.  Patient voiced understanding of the plan of care  and was discharged in good condition with improvement in presenting symptoms of pain and lightheadedness.    Final Clinical Impressions(s) / ED Diagnoses   Final diagnoses:  Nonspecific chest pain  Lightheaded  Dehydration  Diarrhea, unspecified type    New Prescriptions Discharge Medication List as of  12/17/2016  3:19 PM     Clinical Impression: 1. Nonspecific chest pain   2. Lightheaded   3. Dehydration   4. Diarrhea, unspecified type     Disposition: Discharge  Condition: Good  I have discussed the results, Dx and Tx plan with the pt(& family if present). He/she/they expressed understanding and agree(s) with the plan. Discharge instructions discussed at great length. Strict return precautions discussed and pt &/or family have verbalized understanding of the instructions. No further questions at time of discharge.    Discharge Medication List as of 12/17/2016  3:19 PM      Follow Up: [address] [facility] [id number] [phone]  Schedule an appointment as soon as possible for a visit    [facility] [address] [id number] [facility] [address] [place] [phone]  If symptoms worsen     Yanitza Shvartsman, Gwenyth Allegra, MD 12/17/16 2120

## 2016-12-17 NOTE — ED Triage Notes (Signed)
Pt having intermittent chest pain, back pain, neck pain with dizziness, nausea, sob, and weakness for two weeks.  No acute distress noted.  Pt also having lower back and hip pain.

## 2016-12-18 LAB — URINE CULTURE: Culture: NO GROWTH

## 2016-12-25 ENCOUNTER — Encounter: Payer: Self-pay | Admitting: Family Medicine

## 2016-12-25 ENCOUNTER — Ambulatory Visit (INDEPENDENT_AMBULATORY_CARE_PROVIDER_SITE_OTHER): Payer: No Typology Code available for payment source

## 2016-12-25 ENCOUNTER — Ambulatory Visit (INDEPENDENT_AMBULATORY_CARE_PROVIDER_SITE_OTHER): Payer: No Typology Code available for payment source | Admitting: Family Medicine

## 2016-12-25 VITALS — BP 109/62 | HR 67 | Resp 16 | Wt 109.8 lb

## 2016-12-25 DIAGNOSIS — R0789 Other chest pain: Secondary | ICD-10-CM

## 2016-12-25 DIAGNOSIS — M533 Sacrococcygeal disorders, not elsewhere classified: Secondary | ICD-10-CM

## 2016-12-25 DIAGNOSIS — M810 Age-related osteoporosis without current pathological fracture: Secondary | ICD-10-CM

## 2016-12-25 DIAGNOSIS — Z681 Body mass index (BMI) 19 or less, adult: Secondary | ICD-10-CM

## 2016-12-25 DIAGNOSIS — R634 Abnormal weight loss: Secondary | ICD-10-CM

## 2016-12-25 MED ORDER — ALENDRONATE SODIUM 70 MG PO TABS: 70.0000 mg | ORAL_TABLET | ORAL | 11 refills | Status: DC
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
## 2016-12-28 NOTE — Progress Notes (Unsigned)
Order placed.Kellie [name]  

## 2017-01-03 ENCOUNTER — Encounter: Payer: Self-pay | Admitting: Physical Therapy

## 2017-01-03 ENCOUNTER — Ambulatory Visit (INDEPENDENT_AMBULATORY_CARE_PROVIDER_SITE_OTHER): Payer: No Typology Code available for payment source | Admitting: Physical Therapy

## 2017-01-03 DIAGNOSIS — M545 Low back pain, unspecified: Secondary | ICD-10-CM

## 2017-01-03 DIAGNOSIS — R531 Weakness: Secondary | ICD-10-CM

## 2017-01-03 DIAGNOSIS — M62838 Other muscle spasm: Secondary | ICD-10-CM

## 2017-01-03 NOTE — Therapy (Signed)
[place], [place], [address] Phone: [phone]   Fax:  [phone]  Physical Therapy Evaluation  Patient Details  Name: Kellie [name] MRN: [id number] Date of Birth: 05-20-1956 Referring Provider: Dr Madilyn Fireman  Encounter Date: 01/03/2017      PT End of Session - 01/03/17 0805    Visit Number 1   Number of Visits 6   Date for PT Re-Evaluation 02/14/17   PT Start Time 0805   PT Stop Time 1324   PT Time Calculation (min) 54 min   Activity Tolerance Patient tolerated treatment well      Past Medical History:  Diagnosis Date  . Abnormal Pap smear of cervix   . Allergic rhinitis 11/08/2010  . Anxiety   . Asthma   . Bronchitis   . Diabetes mellitus without complication ([place])   . Herpes simplex of female genitalia   . Hyperlipidemia   . Mitral valve prolapse   . Osteoporosis 11/21/2011  . Urticaria     Past Surgical History:  Procedure Laterality Date  . CLOSED MANIPULATION SHOULDER  7&01/2005   x2, 30 days apart  . CLOSED MANIPULATION SHOULDER  2006   Left  . COLONOSCOPY  09/2012   Medium sized external hemorrhoids, few small divertic in left colon, otherwise normal colon (repeat 10 yrs)  . ESOPHAGOGASTRODUODENOSCOPY  09/2012   Normal (Dr. Ardis Hughs)  . LEEP  11/2004    There were no vitals filed for this visit.       Subjective Assessment - 01/03/17 0805    Subjective Pt reports ~ 7 week h/o of LBP and sacral pain,  Progressed to osteoporosis from osteopenia. She hasn't been going to the gym, had to move.    How long can you sit comfortably? no limitations   How long can you walk comfortably? standing longer than 10'   Diagnostic tests decreased bone mass    Patient Stated Goals get back to her norm and have strong bones, less pain.    Currently in Pain? No/denies  bending forward, haivng to take alieve to help control.             [facility] PT Assessment - 01/03/17 0001      Assessment   Medical Diagnosis sacral and low back pain   Referring Provider Dr Madilyn Fireman   Onset Date/Surgical Date 11/15/16   Hand Dominance Right   Prior Therapy years ago     Precautions   Precautions --  osteoporosis     Balance Screen   Has the patient fallen in the past 6 months No     [place] --  apartment     Prior Function   Level of Independence Independent   Vocation On disability   Leisure attend car races, church,      Observation/Other Assessments   Focus on Therapeutic Outcomes (FOTO)  56% limited      Functional Tests   Functional tests Squat;Lunges;Single leg stance     Squat   Comments WNL     Lunges   Comments WNL     Single Leg Stance   Comments WNL     Posture/Postural Control   Posture/Postural Control No significant limitations     ROM / Strength   AROM / PROM / Strength Strength     Strength   Strength Assessment Site Hip;Lumbar   Right/Left Hip Left  Rt WNL except extension 4+/5   Left Hip  Extension 4/5   Left Hip ABduction 4+/5   Lumbar Flexion --  TA good   Lumbar Extension --  multifidi fair, Lt side delayed contraction      Palpation   Spinal mobility NA d/t osteoporosis   Palpation comment tight and tender in the Rt gluts, piriformis and lumbar paraspinals, some tightness in Rt QL and lumbar paraspinals            Objective measurements completed on examination: See above findings.          [facility] Adult PT Treatment/Exercise - 01/03/17 0001      Self-Care   Self-Care Other Self-Care Comments   Other Self-Care Comments  initial education on osteoporosis precautions and  recommendations     Exercises   Exercises Lumbar     Lumbar Exercises: Supine   Other Supine Lumbar Exercises foam rolling bilat gluts and lumbar paraspinals on pool noodle.      Lumbar Exercises: Sidelying   Other Sidelying Lumbar Exercises planks on elbows     Lumbar Exercises: Prone   Other Prone Lumbar  Exercises planks on elbows                PT Education - 01/03/17 0857    Education provided Yes   Education Details HEP   Person(s) Educated Patient   Methods Explanation;Demonstration;Handout   Comprehension Returned demonstration;Verbalized understanding             PT Long Term Goals - 01/03/17 0930      PT LONG TERM GOAL #1   Title I with HEP ( 02/15/17)    Time 6   Period Weeks     PT LONG TERM GOAL #2   Title verbalize precautions associated with osteoporosis for safe movement ( 02/15/17)    Time 6   Period Weeks   Status New     PT LONG TERM GOAL #3   Title report =/>50% reduction of low back/sacral pain to allow her to walk for exercise ( 02/15/17)    Time 6   Period Weeks   Status New     PT LONG TERM GOAL #4   Title demo Lt hip strength 5/5 without pain ( 02/15/17)    Time 6   Period Weeks   Status New     PT LONG TERM GOAL #5   Title improve FOTO =/< 39% limited ( 02/15/17)    Time 6   Period Weeks   Status New                Plan - 01/03/17 3846    Clinical Impression Statement 60 yo female presents with 7 wk h/o sacral and low back pain.  She has recently progressed to having osteoporosis and is experiencing wt loss of unknown origin.  She has some weakness in her core, muscle spasms/tightness and tenderness in her low back and gluts.  Would benefit from PT to instruct in safe exercise with new dx to help with building bone mass and provide core stability, manual work to decrease muscle spasms and education on safe body mechanics.    Clinical Presentation Stable   Clinical Decision Making Low   Rehab Potential Good   PT Frequency 1x / week   PT Duration 6 weeks   PT Treatment/Interventions Moist Heat;Ultrasound;Therapeutic exercise;Dry needling;Taping;Manual techniques;Neuromuscular re-education;Cryotherapy;Electrical Stimulation;Patient/family education   PT Next Visit Plan progress core stability ex, higher level, safe mechanics  for osteoporosis, modalities PRN   Consulted and Agree with Plan  of Care Patient      Patient will benefit from skilled therapeutic intervention in order to improve the following deficits and impairments:  Difficulty walking, Increased muscle spasms, Pain, Decreased strength  Visit Diagnosis: Weakness generalized - Plan: PT plan of care cert/re-cert  Acute bilateral low back pain without sciatica - Plan: PT plan of care cert/re-cert  Other muscle spasm - Plan: PT plan of care cert/re-cert     Problem List Patient Active Problem List   Diagnosis Date Noted  . Weakness 10/05/2016  . Panic attacks 09/18/2016  . SOB (shortness of breath) 09/18/2016  . Bipolar 1 disorder with moderate mania ([place]) 09/17/2016  . Pulmonary nodule 08/08/2016  . Hoarseness 03/09/2016  . Left knee pain 11/09/2015  . Pain in joint, lower leg 11/09/2015  . Plantar fasciitis of left foot 11/09/2015  . Cough, persistent 07/21/2015  . Post-nasal drip 07/21/2015  . Radiculitis of right cervical region 07/06/2015  . DDD (degenerative disc disease), cervical 07/05/2015  . Chronic nasal congestion 03/01/2015  . Vitreous floaters of right eye 03/01/2015  . Near syncope 02/09/2015  . Rhinitis, allergic 10/14/2014  . Family history of renal cancer 10/11/2014  . DDD (degenerative disc disease), lumbar 10/11/2014  . Bipolar disorder with depression ([place]) 06/02/2014  . Postmenopausal atrophic vaginitis 03/17/2014  . Prediabetes 12/14/2013  . Adhesive capsulitis of right shoulder 11/24/2013  . Chronic periodontal disease 06/30/2013  . Genital herpes 11/05/2012  . Hemorrhoids 11/05/2012  . Borderline intellectual functioning 02/15/2012  . Osteoporosis 11/21/2011  . Allergic rhinitis 11/08/2010  . Insomnia 11/08/2010  . GERD (gastroesophageal reflux disease) 09/27/2010    Dewitt Judice,SUE 01/03/2017, 9:58 AM  [facility] [place], [place],  [address] Phone: [phone]   Fax:  [phone]  Name: CLOTILE WHITTINGTON MRN: [id number] Date of Birth: 09/26/1955

## 2017-01-08 ENCOUNTER — Ambulatory Visit (INDEPENDENT_AMBULATORY_CARE_PROVIDER_SITE_OTHER): Payer: No Typology Code available for payment source | Admitting: Physical Therapy

## 2017-01-08 DIAGNOSIS — M62838 Other muscle spasm: Secondary | ICD-10-CM

## 2017-01-08 DIAGNOSIS — R531 Weakness: Secondary | ICD-10-CM

## 2017-01-08 DIAGNOSIS — M545 Low back pain, unspecified: Secondary | ICD-10-CM

## 2017-01-08 NOTE — Therapy (Addendum)
[place], [place], [address] Phone: [phone]   Fax:  [phone]  Physical Therapy Treatment  Patient Details  Name: Kellie [name] MRN: [id number] Date of Birth: 1955/08/19 Referring Provider: Dr. Madilyn Fireman  Encounter Date: 01/08/2017      PT End of Session - 01/08/17 0808    Visit Number 2   Number of Visits 6   Date for PT Re-Evaluation 02/14/17   PT Start Time 0804   PT Stop Time 0843   PT Time Calculation (min) 39 min      Past Medical History:  Diagnosis Date  . Abnormal Pap smear of cervix   . Allergic rhinitis 11/08/2010  . Anxiety   . Asthma   . Bronchitis   . Diabetes mellitus without complication ([place])   . Herpes simplex of female genitalia   . Hyperlipidemia   . Mitral valve prolapse   . Osteoporosis 11/21/2011  . Urticaria     Past Surgical History:  Procedure Laterality Date  . CLOSED MANIPULATION SHOULDER  7&01/2005   x2, 30 days apart  . CLOSED MANIPULATION SHOULDER  2006   Left  . COLONOSCOPY  09/2012   Medium sized external hemorrhoids, few small divertic in left colon, otherwise normal colon (repeat 10 yrs)  . ESOPHAGOGASTRODUODENOSCOPY  09/2012   Normal (Dr. Ardis Hughs)  . LEEP  11/2004    There were no vitals filed for this visit.      Subjective Assessment - 01/08/17 0808    Subjective Pt report she has been walking and doing her exercises.  Her pain is a little better than last week.  She has only taken Aleve 1x.  Exercises have been painful, but she works through it.     Currently in Pain? Yes   Pain Score 5    Pain Location Back   Pain Orientation Right;Left;Lower   Pain Descriptors / Indicators Constant;Dull   Aggravating Factors  plank holding, bending over, lifting water cases.    Pain Relieving Factors medicine            [facility] PT Assessment - 01/08/17 0001      Assessment   Medical Diagnosis sacral and low back pain   Referring Provider Dr.  Madilyn Fireman   Onset Date/Surgical Date 11/15/16   Hand Dominance Right           [facility] Adult PT Treatment/Exercise - 01/08/17 0001      Lumbar Exercises: Aerobic   Elliptical L2: 1 min (low tolerance)   Tread Mill 2.54mph x 5 min      Lumbar Exercises: Standing   Functional Squats 10 reps  dowel against back   Lifting From floor  lifting 3" from floor x 3 reps 18# in bucket   Other Standing Lumbar Exercises split squats to knee on 3" pad x 10 reps each leg.    Other Standing Lumbar Exercises high kneeling to standing x 8 reps each leg (with 3" pad for knees)      Lumbar Exercises: Sidelying   Other Sidelying Lumbar Exercises side planks on elbows     Lumbar Exercises: Prone   Other Prone Lumbar Exercises planks on elbows x 15 sec x 4 reps - modified to knees;  plank on elbows with hip ext x 5 reps, 2 sets.      Modalities   Modalities --  declined.      Thoracic ext over full foam bolster, then pool noodle.  Pt given  demo and repeated VC for form.       PT Long Term Goals - 01/08/17 1115      PT LONG TERM GOAL #1   Title I with HEP ( 02/15/17)    Time 6   Period Weeks   Status On-going     PT LONG TERM GOAL #2   Title verbalize precautions associated with osteoporosis for safe movement ( 02/15/17)    Time 6   Period Weeks   Status On-going     PT LONG TERM GOAL #3   Title report =/>50% reduction of low back/sacral pain to allow her to walk for exercise ( 02/15/17)    Time 6   Period Weeks   Status On-going     PT LONG TERM GOAL #4   Title demo Lt hip strength 5/5 without pain ( 02/15/17)    Time 6   Period Weeks   Status On-going     PT LONG TERM GOAL #5   Title improve FOTO =/< 39% limited ( 02/15/17)    Time 6   Period Weeks               Plan - 01/08/17 2683    Clinical Impression Statement Pt had some increased discomfort in her low back/sacrum with planks. Modified to plank on knees with improved tolerance.  Modified the high kneeling to  standing exercise to split squats with improved tolerance.  Pt declined modalities due to reduction of pain wiht exercise.  Pt was able to demonstrate good posture with squats, with VC. Pt progressing towards goals.    Rehab Potential Good   PT Frequency 1x / week   PT Duration 6 weeks   PT Treatment/Interventions Moist Heat;Ultrasound;Therapeutic exercise;Dry needling;Taping;Manual techniques;Neuromuscular re-education;Cryotherapy;Electrical Stimulation;Patient/family education   PT Next Visit Plan progress core stability ex, higher level, safe mechanics for osteoporosis, modalities PRN.  Assess hip strength.    Consulted and Agree with Plan of Care Patient      Patient will benefit from skilled therapeutic intervention in order to improve the following deficits and impairments:  Difficulty walking, Increased muscle spasms, Pain, Decreased strength  Visit Diagnosis: Weakness generalized  Acute bilateral low back pain without sciatica  Other muscle spasm     Problem List Patient Active Problem List   Diagnosis Date Noted  . Weakness 10/05/2016  . Panic attacks 09/18/2016  . SOB (shortness of breath) 09/18/2016  . Bipolar 1 disorder with moderate mania ([place]) 09/17/2016  . Pulmonary nodule 08/08/2016  . Hoarseness 03/09/2016  . Left knee pain 11/09/2015  . Pain in joint, lower leg 11/09/2015  . Plantar fasciitis of left foot 11/09/2015  . Cough, persistent 07/21/2015  . Post-nasal drip 07/21/2015  . Radiculitis of right cervical region 07/06/2015  . DDD (degenerative disc disease), cervical 07/05/2015  . Chronic nasal congestion 03/01/2015  . Vitreous floaters of right eye 03/01/2015  . Near syncope 02/09/2015  . Rhinitis, allergic 10/14/2014  . Family history of renal cancer 10/11/2014  . DDD (degenerative disc disease), lumbar 10/11/2014  . Bipolar disorder with depression ([place]) 06/02/2014  . Postmenopausal atrophic vaginitis 03/17/2014  . Prediabetes 12/14/2013  .  Adhesive capsulitis of right shoulder 11/24/2013  . Chronic periodontal disease 06/30/2013  . Genital herpes 11/05/2012  . Hemorrhoids 11/05/2012  . Borderline intellectual functioning 02/15/2012  . Osteoporosis 11/21/2011  . Allergic rhinitis 11/08/2010  . Insomnia 11/08/2010  . GERD (gastroesophageal reflux disease) 09/27/2010   Kerin Perna, PTA 01/08/17 11:23 AM  [facility] [place], [place], [address] Phone: [phone]   Fax:  [phone]  Name: Kellie [name] MRN: [id number] Date of Birth: 1955/12/06

## 2017-01-16 ENCOUNTER — Telehealth (HOSPITAL_COMMUNITY): Payer: Self-pay | Admitting: Licensed Clinical Social Worker

## 2017-01-16 ENCOUNTER — Ambulatory Visit (INDEPENDENT_AMBULATORY_CARE_PROVIDER_SITE_OTHER): Payer: Self-pay | Admitting: Psychiatry

## 2017-01-16 ENCOUNTER — Ambulatory Visit (INDEPENDENT_AMBULATORY_CARE_PROVIDER_SITE_OTHER): Payer: No Typology Code available for payment source | Admitting: Physical Therapy

## 2017-01-16 ENCOUNTER — Encounter (HOSPITAL_COMMUNITY): Payer: Self-pay | Admitting: Psychiatry

## 2017-01-16 ENCOUNTER — Encounter: Payer: Self-pay | Admitting: Physical Therapy

## 2017-01-16 VITALS — BP 103/62 | HR 73 | Resp 16 | Ht 66.0 in | Wt 109.0 lb

## 2017-01-16 DIAGNOSIS — M62838 Other muscle spasm: Secondary | ICD-10-CM

## 2017-01-16 DIAGNOSIS — F419 Anxiety disorder, unspecified: Secondary | ICD-10-CM

## 2017-01-16 DIAGNOSIS — F5102 Adjustment insomnia: Secondary | ICD-10-CM

## 2017-01-16 DIAGNOSIS — Z818 Family history of other mental and behavioral disorders: Secondary | ICD-10-CM

## 2017-01-16 DIAGNOSIS — G4700 Insomnia, unspecified: Secondary | ICD-10-CM

## 2017-01-16 DIAGNOSIS — M791 Myalgia: Secondary | ICD-10-CM

## 2017-01-16 DIAGNOSIS — M545 Low back pain, unspecified: Secondary | ICD-10-CM

## 2017-01-16 DIAGNOSIS — R531 Weakness: Secondary | ICD-10-CM

## 2017-01-16 DIAGNOSIS — F310 Bipolar disorder, current episode hypomanic: Secondary | ICD-10-CM

## 2017-01-16 MED ORDER — GABAPENTIN 100 MG PO CAPS: 100.0000 mg | ORAL_CAPSULE | Freq: Two times a day (BID) | ORAL | 1 refills | Status: DC

## 2017-01-16 NOTE — Progress Notes (Signed)
Patient ID: Kellie [name], female   DOB: May 16, 1956, 61 y.o.   MRN: [id number]   [place] Follow up Outpatient visit  Kellie [name] [id number] 61 y.o.  01/16/2017 1:12 PM  Chief Complaint:   Follow up for Bipolar  And anxiety.  History of Present Illness:   Patient  Initially presented with racing toughts and cant sleep at night.   Patient returning after 1 year last visit she was kept on Lamictal and was supposed to increase she has been reluctant to taking medications since that does not want to change her chemicals but overall she is taking Celexa that has recently kept her calm but she does get into arguments easily says people patient me off and then I let it out. She is currently in therapy and was referred for possible management of her mood symptoms There is family history of bipolar sweet talk about possibility of adding a mood stabilizer Says abilify didn't do well and she has kept away from bipolar meds  Sleep: variable, takes at times trazadone  Or xanax if needed  Modifying factor: sister Aggravating factor: arguments, pain.  Severity of depression; 6/10 Context; feeling lonely, worried about her age and finances Medical complexity: back pain and DDD makes her sleep and mood worse. She is going to physical therapy Denies drug, alcohol abuse or use.  Past Psychiatric History/Hospitalization(s) denies  Hospitalization for psychiatric illness: No History of Electroconvulsive Shock Therapy: No Prior Suicide Attempts: No  Medical History; Past Medical History:  Diagnosis Date  . Abnormal Pap smear of cervix   . Allergic rhinitis 11/08/2010  . Anxiety   . Asthma   . Bronchitis   . Diabetes mellitus without complication ([place])   . Herpes simplex of female genitalia   . Hyperlipidemia   . Mitral valve prolapse   . Osteoporosis 11/21/2011  . Urticaria     Allergies: Allergies  Allergen Reactions  . Hydrocodone Nausea And Vomiting  . Abilify  [Aripiprazole]     Nausea/vomiting/weakness/shaky  . Acyclovir And Related   . Codeine Nausea And Vomiting  . Morphine And Related Nausea And Vomiting  . Percocet [Oxycodone-Acetaminophen] Nausea And Vomiting and Other (See Comments)    Patient states it makes blood pressure drop too much  . Vicodin [Hydrocodone-Acetaminophen] Nausea And Vomiting    Medications: Outpatient Encounter Prescriptions as of 01/16/2017  Medication Sig  . acyclovir (ZOVIRAX) 200 MG capsule Take 2 capsules (400 mg total) by mouth 2 (two) times daily.  [name] Kitchen alendronate (FOSAMAX) 70 MG tablet Take 1 tablet (70 mg total) by mouth every 7 (seven) days. Take with a full glass of water on an empty stomach.  . ALPRAZolam (XANAX) 0.5 MG tablet TAKE ONE TABLET BY MOUTH ONCE DAILY AS NEEDED FOR ANXIETY  . AMBULATORY NON FORMULARY MEDICATION Glucometer with lancets and test strips to test as needed for hypoglycemia.  #20 strip  . cetirizine (ZYRTEC) 10 MG tablet Take 10 mg by mouth daily.  . Cholecalciferol (VITAMIN D) 2000 units CAPS Take by mouth.  . citalopram (CELEXA) 10 MG tablet Take 1 tablet (10 mg total) by mouth daily.  . famotidine (PEPCID) 10 MG tablet Take 10 mg by mouth 2 (two) times daily.  [name] Kitchen ipratropium-albuterol (DUONEB) 0.5-2.5 (3) MG/3ML SOLN Take 3 mLs by nebulization every 6 (six) hours as needed.  . montelukast (SINGULAIR) 10 MG tablet Take 1 tablet (10 mg total) by mouth at bedtime.  . mupirocin ointment (BACTROBAN) 2 % Place 1 application into  the nose 2 (two) times daily.  . Probiotic Product (PROBIOTIC & ACIDOPHILUS EX ST PO) Take by mouth.  . gabapentin (NEURONTIN) 100 MG capsule Take 1 capsule (100 mg total) by mouth 2 (two) times daily.   Facility-Administered Encounter Medications as of 01/16/2017  Medication  . albuterol (PROVENTIL) (2.5 MG/3ML) 0.083% nebulizer solution 2.5 mg     Family History; Family History  Problem Relation Age of Onset  . Kidney cancer Mother 68  . COPD Mother   .  Cancer Mother        renal  . Kidney disease Mother   . Allergic rhinitis Mother   . Asthma Mother   . Heart disease Father   . Allergic rhinitis Father   . COPD Sister   . Depression Sister   . Heart disease Brother   . Heart attack Brother   . Heart disease Paternal Uncle   . Diabetes Unknown        neice  . Thyroid disease Unknown        neice  . Depression Maternal Aunt   . Depression Maternal Grandfather   . Colon cancer Neg Hx   . Rectal cancer Neg Hx   . Stomach cancer Neg Hx   . Colon polyps Neg Hx        Labs:  Recent Results (from the past 2160 hour(s))  Comprehensive metabolic panel     Status: None   Collection Time: 12/17/16 11:15 AM  Result Value Ref Range   Sodium 139 135 - 145 mmol/L   Potassium 4.3 3.5 - 5.1 mmol/L   Chloride 103 101 - 111 mmol/L   CO2 30 22 - 32 mmol/L   Glucose, Bld 82 65 - 99 mg/dL   BUN 18 6 - 20 mg/dL   Creatinine, Ser 0.85 0.44 - 1.00 mg/dL   Calcium 9.5 8.9 - 10.3 mg/dL   Total Protein 7.1 6.5 - 8.1 g/dL   Albumin 4.2 3.5 - 5.0 g/dL   AST 19 15 - 41 U/L   ALT 14 14 - 54 U/L   Alkaline Phosphatase 42 38 - 126 U/L   Total Bilirubin 0.5 0.3 - 1.2 mg/dL   GFR calc non Af Amer >60 >60 mL/min   GFR calc Af Amer >60 >60 mL/min    Comment: (NOTE) The eGFR has been calculated using the CKD EPI equation. This calculation has not been validated in all clinical situations. eGFR's persistently <60 mL/min signify possible Chronic Kidney Disease.    Anion gap 6 5 - 15  CBC with Differential     Status: None   Collection Time: 12/17/16 11:15 AM  Result Value Ref Range   WBC 4.2 4.0 - 10.5 K/uL   RBC 4.09 3.87 - 5.11 MIL/uL   Hemoglobin 12.8 12.0 - 15.0 g/dL   HCT 37.8 36.0 - 46.0 %   MCV 92.4 78.0 - 100.0 fL   MCH 31.3 26.0 - 34.0 pg   MCHC 33.9 30.0 - 36.0 g/dL   RDW 12.6 11.5 - 15.5 %   Platelets 286 150 - 400 K/uL   Neutrophils Relative % 66 %   Neutro Abs 2.8 1.7 - 7.7 K/uL   Lymphocytes Relative 22 %   Lymphs Abs 0.9  0.7 - 4.0 K/uL   Monocytes Relative 9 %   Monocytes Absolute 0.4 0.1 - 1.0 K/uL   Eosinophils Relative 2 %   Eosinophils Absolute 0.1 0.0 - 0.7 K/uL   Basophils Relative 1 %  Basophils Absolute 0.0 0.0 - 0.1 K/uL  Troponin I     Status: None   Collection Time: 12/17/16 11:15 AM  Result Value Ref Range   Troponin I <0.03 <0.03 ng/mL  D-dimer, quantitative (not at [facility])     Status: Abnormal   Collection Time: 12/17/16 11:15 AM  Result Value Ref Range   D-Dimer, Quant 0.93 (H) 0.00 - 0.50 ug/mL-FEU    Comment: (NOTE) At the manufacturer cut-off of 0.50 ug/mL FEU, this assay has been documented to exclude PE with a sensitivity and negative predictive value of 97 to 99%.  At this time, this assay has not been approved by the FDA to exclude DVT/VTE. Results should be correlated with clinical presentation.   Lipase, blood     Status: None   Collection Time: 12/17/16 11:15 AM  Result Value Ref Range   Lipase 40 11 - 51 U/L  Magnesium     Status: None   Collection Time: 12/17/16 11:15 AM  Result Value Ref Range   Magnesium 2.2 1.7 - 2.4 mg/dL  TSH     Status: None   Collection Time: 12/17/16 11:41 AM  Result Value Ref Range   TSH 1.357 0.350 - 4.500 uIU/mL    Comment: Performed by a 3rd Generation assay with a functional sensitivity of <=0.01 uIU/mL. Performed at [facility], [place] [address] [place], [place] [address]   Urinalysis, Routine w reflex microscopic     Status: None   Collection Time: 12/17/16 12:20 PM  Result Value Ref Range   Color, Urine YELLOW YELLOW   APPearance CLEAR CLEAR   Specific Gravity, Urine 1.016 1.005 - 1.030   pH 6.5 5.0 - 8.0   Glucose, UA NEGATIVE NEGATIVE mg/dL   Hgb urine dipstick NEGATIVE NEGATIVE   Bilirubin Urine NEGATIVE NEGATIVE   Ketones, ur NEGATIVE NEGATIVE mg/dL   Protein, ur NEGATIVE NEGATIVE mg/dL   Nitrite NEGATIVE NEGATIVE   Leukocytes, UA NEGATIVE NEGATIVE    Comment: Microscopic not done on urines with negative protein,  blood, leukocytes, nitrite, or glucose < 500 mg/dL.  Urine culture     Status: None   Collection Time: 12/17/16 12:20 PM  Result Value Ref Range   Specimen Description URINE, RANDOM    Special Requests NONE    Culture      NO GROWTH Performed at [facility], [place] [address] [place], [place] [address]    Report Status 12/18/2016 FINAL      Mental Status Examination;   Psychiatric Specialty Exam: Physical Exam  Constitutional: She appears well-developed and well-nourished.  Skin: She is not diaphoretic.    Review of Systems  Constitutional: Negative for fever.  Cardiovascular: Negative for chest pain.  Musculoskeletal: Positive for myalgias.  Skin: Negative for rash.  Neurological: Negative for tremors and headaches.  Psychiatric/Behavioral: Negative for suicidal ideas. The patient has insomnia.     Blood pressure 103/62, pulse 73, resp. rate 16, height _0  (1.676 m), weight 109 lb (49.4 kg), SpO2 94 %.Body mass index is 17.59 kg/m.  General Appearance: Casual  Eye Contact::  Fair  Speech:  coherent  Volume:  Normal  Mood: euthymic, says gets edgy easily  Affect: reactive  Thought Process: clear . No psychosis  Orientation:  Full (Time, Place, and Person)  Thought Content:  Rumination  Suicidal Thoughts:  No  Homicidal Thoughts:  No  Memory:  Immediate;   Fair Recent;   Fair  Judgement:  Fair  Insight:  Shallow  Psychomotor Activity:  Increased  Concentration:  Fair  Recall:  Fair  Akathisia:  Negative  Handed:  Right  AIMS (if indicated):     Assets:  Desire for Improvement Physical Health Transportation  Sleep:        Assessment: Axis I: bipolar disorder II unspecified or depressed type. Anxiety disorder NOS. Insomnia  Axis II: deferred  Axis III:  Past Medical History:  Diagnosis Date  . Abnormal Pap smear of cervix   . Allergic rhinitis 11/08/2010  . Anxiety   . Asthma   . Bronchitis   . Diabetes mellitus without complication ([place])    . Herpes simplex of female genitalia   . Hyperlipidemia   . Mitral valve prolapse   . Osteoporosis 11/21/2011  . Urticaria     Axis IV: psychosocial   Treatment Plan and Summary:  Bipolar : gets edgy easily in arguments. Talked about mood stabilizer, will start small dose of gabapentin 148m bid and adjust if needed  GAD: fluctuates, continue celexa. Has refills Insomnia: reviewed sleep hygiene, takes prn trazadone or xanax  Provided supportive therapy discussed compliance of medications and her condition and possible adjustment to add a small dose of gabapentin Celexa has kept her somewhat, but considering her family history and past diagnoses adding a mood stabilizer would be beneficial . Relevant Medications refilled or called in to pharmacy.  Follow up with Primary care provider in regards to Medical conditions/labs follow up and concerns  Reviewed side effects, questions were addressed.   FU 1 month or earlier. Continue therapy to work on cRadiographer, therapeutic  AMerian Capron MD 01/16/2017

## 2017-01-16 NOTE — Patient Instructions (Addendum)
Hip Extension (All-Fours) - can come up on hand in a fist to protect wrists    Have blue band around feet, press right leg back and up. Do not arch neck or back, keep core tight. Bring knee in, don't touch down and repeat.  Repeat __10__ times per set. Do __3__ sets per session. Do __1__ sessions per day. Repeat on the other leg.   Double Leg Circles    Lie on back, legs toward ceiling, feet softly pointed. Hands under tailbone ( if needed to stabilize back) and low back pressed down.  In continuous motion, inhale, opening legs around and down to 45 above floor. Exhale, bringing legs back toward ceiling. Keep circle small, opening legs slightly wider than hips. Repeat __3x10__ times. Reverse circle _3x10___ times. (Lower legs then open and circle up.) Do __1__ sessions per day.  Swimming    Lie on stomach. Inhale, lifting arms, upper body, and legs, keeping head in line with spine. Exhale, quickly fluttering arms and legs in opposition for __2__ changes. Inhale for __2__ changes. Repeat __3x10__ times. Do _1___ sessions per day. NOTE: Do not arch neck. Keep arms and legs long.  Copyright  VHI. All rights reserved.

## 2017-01-16 NOTE — Therapy (Signed)
[place], [place], [address] Phone: [phone]   Fax:  [phone]  Physical Therapy Treatment  Patient Details  Name: Kellie [name] MRN: [id number] Date of Birth: 11-18-1955 Referring Provider: Dr. Madilyn Fireman  Encounter Date: 01/16/2017      PT End of Session - 01/16/17 1058    Visit Number 3   Number of Visits 6   Date for PT Re-Evaluation 02/14/17   PT Start Time 1058   PT Stop Time (P)  1138   PT Time Calculation (min) (P)  40 min      Past Medical History:  Diagnosis Date  . Abnormal Pap smear of cervix   . Allergic rhinitis 11/08/2010  . Anxiety   . Asthma   . Bronchitis   . Diabetes mellitus without complication ([place])   . Herpes simplex of female genitalia   . Hyperlipidemia   . Mitral valve prolapse   . Osteoporosis 11/21/2011  . Urticaria     Past Surgical History:  Procedure Laterality Date  . CLOSED MANIPULATION SHOULDER  7&01/2005   x2, 30 days apart  . CLOSED MANIPULATION SHOULDER  2006   Left  . COLONOSCOPY  09/2012   Medium sized external hemorrhoids, few small divertic in left colon, otherwise normal colon (repeat 10 yrs)  . ESOPHAGOGASTRODUODENOSCOPY  09/2012   Normal (Dr. Ardis Hughs)  . LEEP  11/2004    There were no vitals filed for this visit.      Subjective Assessment - 01/16/17 1059    Subjective Pt reports she is still sensitive to touch in her buttocks. Tolerates revised HEP well.    Currently in Pain? No/denies            [facility] PT Assessment - 01/16/17 0001      Assessment   Medical Diagnosis sacral and low back pain     Strength   Left Hip Extension 4+/5   Left Hip ABduction --  5-/5                     [facility] Adult PT Treatment/Exercise - 01/16/17 0001      Lumbar Exercises: Stretches   Single Knee to Chest Stretch 20 seconds   Double Knee to Chest Stretch 20 seconds   Piriformis Stretch 30 seconds     Lumbar Exercises:  Aerobic   UBE (Upper Arm Bike) on upside down bosu, L3 x 5' alt FWD/BWD     Lumbar Exercises: Supine   Heel Slides 20 reps  in bridge position with pillow case under heels.    Other Supine Lumbar Exercises 3x10 PPT with CW/CCW circles bilat LE at same time.      Lumbar Exercises: Sidelying   Other Sidelying Lumbar Exercises 10 reps each side, each exercise, pilates leg pulses FWD/BWD, CW/CCW circles and arches with taps FWD/BWD     Lumbar Exercises: Quadruped   Straight Leg Raise --  3x10 each side with blue band around feet                PT Education - 01/16/17 1122    Education provided Yes   Education Details HEP progression   Person(s) Educated Patient   Methods Explanation;Demonstration;Handout   Comprehension Returned demonstration;Verbalized understanding             PT Long Term Goals - 01/16/17 1102      PT LONG TERM GOAL #1   Title I with HEP ( 02/15/17)  Status On-going     PT LONG TERM GOAL #2   Title verbalize precautions associated with osteoporosis for safe movement ( 02/15/17)    Status On-going     PT LONG TERM GOAL #3   Title report =/>50% reduction of low back/sacral pain to allow her to walk for exercise ( 02/15/17)    Status On-going     PT LONG TERM GOAL #4   Title demo Lt hip strength 5/5 without pain ( 02/15/17)    Status On-going     PT LONG TERM GOAL #5   Title improve FOTO =/< 39% limited ( 02/15/17)    Status On-going               Plan - 01/16/17 1409    Clinical Impression Statement Kellie [name]'s pain is becoming more controlled.  She still has core weakness and fatigued with today's exercise.  Added some of these to her HEP. Slow progress to goals.    Rehab Potential Good   PT Frequency 1x / week   PT Duration 6 weeks   PT Treatment/Interventions Moist Heat;Ultrasound;Therapeutic exercise;Dry needling;Taping;Manual techniques;Neuromuscular re-education;Cryotherapy;Electrical Stimulation;Patient/family education   PT  Next Visit Plan progress core stability ex, higher level, safe mechanics for osteoporosis, modalities PRN.     Consulted and Agree with Plan of Care Patient      Patient will benefit from skilled therapeutic intervention in order to improve the following deficits and impairments:  Difficulty walking, Increased muscle spasms, Pain, Decreased strength  Visit Diagnosis: Weakness generalized  Acute bilateral low back pain without sciatica  Other muscle spasm     Problem List Patient Active Problem List   Diagnosis Date Noted  . Weakness 10/05/2016  . Panic attacks 09/18/2016  . SOB (shortness of breath) 09/18/2016  . Bipolar 1 disorder with moderate mania ([place]) 09/17/2016  . Pulmonary nodule 08/08/2016  . Hoarseness 03/09/2016  . Left knee pain 11/09/2015  . Pain in joint, lower leg 11/09/2015  . Plantar fasciitis of left foot 11/09/2015  . Cough, persistent 07/21/2015  . Post-nasal drip 07/21/2015  . Radiculitis of right cervical region 07/06/2015  . DDD (degenerative disc disease), cervical 07/05/2015  . Chronic nasal congestion 03/01/2015  . Vitreous floaters of right eye 03/01/2015  . Near syncope 02/09/2015  . Rhinitis, allergic 10/14/2014  . Family history of renal cancer 10/11/2014  . DDD (degenerative disc disease), lumbar 10/11/2014  . Bipolar disorder with depression ([place]) 06/02/2014  . Postmenopausal atrophic vaginitis 03/17/2014  . Prediabetes 12/14/2013  . Adhesive capsulitis of right shoulder 11/24/2013  . Chronic periodontal disease 06/30/2013  . Genital herpes 11/05/2012  . Hemorrhoids 11/05/2012  . Borderline intellectual functioning 02/15/2012  . Osteoporosis 11/21/2011  . Allergic rhinitis 11/08/2010  . Insomnia 11/08/2010  . GERD (gastroesophageal reflux disease) 09/27/2010    Jeral Pinch PT  01/16/2017, 2:11 PM  [facility] [place], [place], [address] Phone: [phone]    Fax:  [phone]  Name: Kellie [name] MRN: [id number] Date of Birth: 24-Dec-1955

## 2017-01-16 NOTE — Telephone Encounter (Signed)
Patient only wants to speak to you. Patient request for me to have you call her. Patient would not like to discuss anything with the front desk receptionist.

## 2017-01-17 ENCOUNTER — Telehealth (HOSPITAL_COMMUNITY): Payer: Self-pay | Admitting: Licensed Clinical Social Worker

## 2017-01-17 NOTE — Telephone Encounter (Signed)
Therapist returned patient's call and she asked therapist about finding ways to help her pay for mood stabilizer which will cost $14 or $15 dollars. Therapist related she would relay information to CNA to find resources that were available for her.

## 2017-01-23 ENCOUNTER — Encounter: Payer: Self-pay | Admitting: Physical Therapy

## 2017-01-23 ENCOUNTER — Ambulatory Visit (INDEPENDENT_AMBULATORY_CARE_PROVIDER_SITE_OTHER): Payer: No Typology Code available for payment source | Admitting: Physical Therapy

## 2017-01-23 DIAGNOSIS — M62838 Other muscle spasm: Secondary | ICD-10-CM

## 2017-01-23 DIAGNOSIS — M545 Low back pain, unspecified: Secondary | ICD-10-CM

## 2017-01-23 DIAGNOSIS — R531 Weakness: Secondary | ICD-10-CM

## 2017-01-23 NOTE — Therapy (Signed)
[place], [place], [address] Phone: [phone]   Fax:  [phone]  Physical Therapy Treatment  Patient Details  Name: Kellie [name] MRN: [id number] Date of Birth: 1956-04-20 Referring Provider: Dr. Madilyn Fireman  Encounter Date: 01/23/2017      PT End of Session - 01/23/17 0804    Visit Number 4   Number of Visits 6   Date for PT Re-Evaluation 02/14/17   PT Start Time 0804   PT Stop Time 0845   PT Time Calculation (min) 41 min   Activity Tolerance Patient tolerated treatment well      Past Medical History:  Diagnosis Date  . Abnormal Pap smear of cervix   . Allergic rhinitis 11/08/2010  . Anxiety   . Asthma   . Bronchitis   . Diabetes mellitus without complication ([place])   . Herpes simplex of female genitalia   . Hyperlipidemia   . Mitral valve prolapse   . Osteoporosis 11/21/2011  . Urticaria     Past Surgical History:  Procedure Laterality Date  . CLOSED MANIPULATION SHOULDER  7&01/2005   x2, 30 days apart  . CLOSED MANIPULATION SHOULDER  2006   Left  . COLONOSCOPY  09/2012   Medium sized external hemorrhoids, few small divertic in left colon, otherwise normal colon (repeat 10 yrs)  . ESOPHAGOGASTRODUODENOSCOPY  09/2012   Normal (Dr. Ardis Hughs)  . LEEP  11/2004    There were no vitals filed for this visit.      Subjective Assessment - 01/23/17 0806    Subjective Kellie [name] reports she was doing well until the last couple of days. she is unable to do the new HEP on the floor at her house due to knee pain so she is doing them on her bed.    Patient Stated Goals get back to her norm and have strong bones, less pain.    Currently in Pain? Yes   Pain Score 6    Pain Location Back   Pain Orientation Mid;Lower   Pain Descriptors / Indicators Constant   Pain Type Chronic pain   Pain Onset More than a month ago   Pain Frequency Constant   Aggravating Factors  not sure   Pain Relieving  Factors medicine and some exercise                         [facility] Adult PT Treatment/Exercise - 01/23/17 0001      Lumbar Exercises: Aerobic   UBE (Upper Arm Bike) on upside down bosu, L3 x 5' alt FWD/BWD     Lumbar Exercises: Standing   Other Standing Lumbar Exercises 2x10 curtsey lunges with 2# reaching overhead.      Lumbar Exercises: Supine   Bridge --  30 reps, single leg bridges with 10# on hip   Other Supine Lumbar Exercises core work - "climbing the rope"   Other Supine Lumbar Exercises 30 reps lat pulls with 10# TA bracing.      Lumbar Exercises: [name] Mediate bear position with side steps                PT Education - 01/23/17 0828    Education provided Yes   Education Details more HEP    Person(s) Educated Patient   Methods Demonstration;Explanation;Handout   Comprehension Returned demonstration             PT Long Term Goals - 01/23/17 [phone]  PT LONG TERM GOAL #1   Title I with HEP ( 02/15/17)    Status On-going     PT LONG TERM GOAL #2   Title verbalize precautions associated with osteoporosis for safe movement ( 02/15/17)      PT LONG TERM GOAL #3   Title report =/>50% reduction of low back/sacral pain to allow her to walk for exercise ( 02/15/17)    Status On-going     PT LONG TERM GOAL #4   Title demo Lt hip strength 5/5 without pain ( 02/15/17)    Status On-going     PT LONG TERM GOAL #5   Title improve FOTO =/< 39% limited ( 02/15/17)    Status On-going               Plan - 01/23/17 0932    Clinical Impression Statement Kellie [name] had slight increase in pain the last couple of days.  Was able to tolerate todays exercise with no increase in pain. Did require some VC for form.  She is making progress to goals.    Rehab Potential Good   PT Frequency 1x / week   PT Treatment/Interventions Moist Heat;Ultrasound;Therapeutic exercise;Dry needling;Taping;Manual techniques;Neuromuscular  re-education;Cryotherapy;Electrical Stimulation;Patient/family education   PT Next Visit Plan progress core stability ex, higher level, safe mechanics for osteoporosis, modalities PRN.     Consulted and Agree with Plan of Care Patient      Patient will benefit from skilled therapeutic intervention in order to improve the following deficits and impairments:  Difficulty walking, Increased muscle spasms, Pain, Decreased strength  Visit Diagnosis: Weakness generalized  Acute bilateral low back pain without sciatica  Other muscle spasm     Problem List Patient Active Problem List   Diagnosis Date Noted  . Weakness 10/05/2016  . Panic attacks 09/18/2016  . SOB (shortness of breath) 09/18/2016  . Bipolar 1 disorder with moderate mania ([place]) 09/17/2016  . Pulmonary nodule 08/08/2016  . Hoarseness 03/09/2016  . Left knee pain 11/09/2015  . Pain in joint, lower leg 11/09/2015  . Plantar fasciitis of left foot 11/09/2015  . Cough, persistent 07/21/2015  . Post-nasal drip 07/21/2015  . Radiculitis of right cervical region 07/06/2015  . DDD (degenerative disc disease), cervical 07/05/2015  . Chronic nasal congestion 03/01/2015  . Vitreous floaters of right eye 03/01/2015  . Near syncope 02/09/2015  . Rhinitis, allergic 10/14/2014  . Family history of renal cancer 10/11/2014  . DDD (degenerative disc disease), lumbar 10/11/2014  . Bipolar disorder with depression ([place]) 06/02/2014  . Postmenopausal atrophic vaginitis 03/17/2014  . Prediabetes 12/14/2013  . Adhesive capsulitis of right shoulder 11/24/2013  . Chronic periodontal disease 06/30/2013  . Genital herpes 11/05/2012  . Hemorrhoids 11/05/2012  . Borderline intellectual functioning 02/15/2012  . Osteoporosis 11/21/2011  . Allergic rhinitis 11/08/2010  . Insomnia 11/08/2010  . GERD (gastroesophageal reflux disease) 09/27/2010    Jeral Pinch PT  01/23/2017, 9:33 AM  [facility] [place], [place], [address] Phone: [phone]   Fax:  [phone]  Name: Kellie [name] MRN: [id number] Date of Birth: [birth year]

## 2017-01-30 ENCOUNTER — Encounter: Payer: Self-pay | Admitting: Physical Therapy

## 2017-01-31 ENCOUNTER — Encounter: Payer: Self-pay | Admitting: Physical Therapy

## 2017-02-06 ENCOUNTER — Ambulatory Visit (INDEPENDENT_AMBULATORY_CARE_PROVIDER_SITE_OTHER): Payer: No Typology Code available for payment source | Admitting: Physical Therapy

## 2017-02-06 ENCOUNTER — Encounter: Payer: Self-pay | Admitting: Physical Therapy

## 2017-02-06 DIAGNOSIS — M545 Low back pain, unspecified: Secondary | ICD-10-CM

## 2017-02-06 DIAGNOSIS — R531 Weakness: Secondary | ICD-10-CM

## 2017-02-06 DIAGNOSIS — M62838 Other muscle spasm: Secondary | ICD-10-CM

## 2017-02-06 NOTE — Therapy (Addendum)
[place], [place], [address] Phone: [phone]   Fax:  [phone]  Physical Therapy Treatment  Patient Details  Name: Kellie [name] MRN: [id number] Date of Birth: 08-25-1955 Referring Provider: Dr Madilyn Fireman  Encounter Date: 02/06/2017      PT End of Session - 02/06/17 0846    Visit Number 5   Number of Visits 6   Date for PT Re-Evaluation 02/14/17   PT Start Time 0846   PT Stop Time 0930   PT Time Calculation (min) 44 min   Activity Tolerance Patient tolerated treatment well      Past Medical History:  Diagnosis Date  . Abnormal Pap smear of cervix   . Allergic rhinitis 11/08/2010  . Anxiety   . Asthma   . Bronchitis   . Diabetes mellitus without complication ([place])   . Herpes simplex of female genitalia   . Hyperlipidemia   . Mitral valve prolapse   . Osteoporosis 11/21/2011  . Urticaria     Past Surgical History:  Procedure Laterality Date  . CLOSED MANIPULATION SHOULDER  7&01/2005   x2, 30 days apart  . CLOSED MANIPULATION SHOULDER  2006   Left  . COLONOSCOPY  09/2012   Medium sized external hemorrhoids, few small divertic in left colon, otherwise normal colon (repeat 10 yrs)  . ESOPHAGOGASTRODUODENOSCOPY  09/2012   Normal (Dr. Ardis Hughs)  . LEEP  11/2004    There were no vitals filed for this visit.      Subjective Assessment - 02/06/17 0848    Subjective Kellie [name] had a road trip to [place] and was very sore in her back however today she has no pain.    Patient Stated Goals get back to her norm and have strong bones, less pain.    Currently in Pain? No/denies            [facility] PT Assessment - 02/06/17 0001      Assessment   Medical Diagnosis sacral and low back pain   Referring Provider Dr Madilyn Fireman   Onset Date/Surgical Date 11/15/16   Hand Dominance Right     Strength   Strength Assessment Site Hip;Lumbar   Right/Left Hip Left   Left Hip Extension --  5-/5   Left Hip ABduction --  5-/5                     [facility] Adult PT Treatment/Exercise - 02/06/17 0001      Lumbar Exercises: Stretches   Passive Hamstring Stretch 30 seconds;1 rep   ITB Stretch 30 seconds;1 rep  cross body with strap   Piriformis Stretch 30 seconds     Lumbar Exercises: Aerobic   UBE (Upper Arm Bike) on upside down bosu, L3 x 5' alt FWD/BWD     Lumbar Exercises: Supine   Bridge 10 reps;Non-compliant  10 sec holds   Bridge Limitations feet on ball   Other Supine Lumbar Exercises 3x10 legs up, mini crunches, blue band around ankles.    Other Supine Lumbar Exercises 3x10 V ups     Lumbar Exercises: Sidelying   Hip Abduction 20 reps  blue band around ankles, bilat leg lifts.                PT Education - 02/06/17 0916    Education provided Yes   Education Details HEP progression   Person(s) Educated Patient   Methods Explanation;Demonstration;Handout   Comprehension Returned demonstration  PT Long Term Goals - 02/06/17 0849      PT LONG TERM GOAL #1   Title I with HEP ( 02/15/17)    Status On-going     PT LONG TERM GOAL #2   Title verbalize precautions associated with osteoporosis for safe movement ( 02/15/17)    Status Achieved     PT LONG TERM GOAL #3   Title report =/>50% reduction of low back/sacral pain to allow her to walk for exercise ( 02/15/17)    Status On-going     PT LONG TERM GOAL #4   Title demo Lt hip strength 5/5 without pain ( 02/15/17)    Status Partially Met  5-/5 in hip ext and abduction, flex 5/5     PT LONG TERM GOAL #5   Title improve FOTO =/< 39% limited ( 02/15/17)    Status On-going               Plan - 02/06/17 0920    Clinical Impression Statement Kellie [name] has shown increase strength in her Lt hip, almost met her strength goal.  She is having less pain overall in her low back.  Will see for one more visit to finalize and add more to her HEP, then discharge to HEP    Rehab  Potential Good   PT Frequency 1x / week   PT Duration 6 weeks   PT Treatment/Interventions Moist Heat;Ultrasound;Therapeutic exercise;Dry needling;Taping;Manual techniques;Neuromuscular re-education;Cryotherapy;Electrical Stimulation;Patient/family education   PT Next Visit Plan FOTO, finalize HEP , discharge to HEP    Consulted and Agree with Plan of Care Patient      Patient will benefit from skilled therapeutic intervention in order to improve the following deficits and impairments:  Difficulty walking, Increased muscle spasms, Pain, Decreased strength  Visit Diagnosis: Weakness generalized  Acute bilateral low back pain without sciatica  Other muscle spasm     Problem List Patient Active Problem List   Diagnosis Date Noted  . Weakness 10/05/2016  . Panic attacks 09/18/2016  . SOB (shortness of breath) 09/18/2016  . Bipolar 1 disorder with moderate mania ([place]) 09/17/2016  . Pulmonary nodule 08/08/2016  . Hoarseness 03/09/2016  . Left knee pain 11/09/2015  . Pain in joint, lower leg 11/09/2015  . Plantar fasciitis of left foot 11/09/2015  . Cough, persistent 07/21/2015  . Post-nasal drip 07/21/2015  . Radiculitis of right cervical region 07/06/2015  . DDD (degenerative disc disease), cervical 07/05/2015  . Chronic nasal congestion 03/01/2015  . Vitreous floaters of right eye 03/01/2015  . Near syncope 02/09/2015  . Rhinitis, allergic 10/14/2014  . Family history of renal cancer 10/11/2014  . DDD (degenerative disc disease), lumbar 10/11/2014  . Bipolar disorder with depression ([place]) 06/02/2014  . Postmenopausal atrophic vaginitis 03/17/2014  . Prediabetes 12/14/2013  . Adhesive capsulitis of right shoulder 11/24/2013  . Chronic periodontal disease 06/30/2013  . Genital herpes 11/05/2012  . Hemorrhoids 11/05/2012  . Borderline intellectual functioning 02/15/2012  . Osteoporosis 11/21/2011  . Allergic rhinitis 11/08/2010  . Insomnia 11/08/2010  . GERD  (gastroesophageal reflux disease) 09/27/2010    Jeral Pinch PT 02/06/2017, 9:24 AM  [facility] [place], [place], [address] Phone: [phone]   Fax:  [phone]  Name: Kellie [name] MRN: [id number] Date of Birth: 12-12-1955   PHYSICAL THERAPY DISCHARGE SUMMARY  Visits from Start of Care: 5  Current functional level related to goals / functional outcomes: Unknown, patient had been doing very well with  HEP, was to be discharged to this soon however did not return for therapy.    Remaining deficits: unknown   Education / Equipment: HEP Plan:                                                    Patient goals were partially met. Patient is being discharged due to not returning since the last visit.  ?????    Jeral Pinch, PT 03/11/17 8:35 AM

## 2017-02-07 ENCOUNTER — Ambulatory Visit (INDEPENDENT_AMBULATORY_CARE_PROVIDER_SITE_OTHER): Payer: Self-pay | Admitting: Licensed Clinical Social Worker

## 2017-02-07 DIAGNOSIS — F310 Bipolar disorder, current episode hypomanic: Secondary | ICD-10-CM
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
## 2017-02-15 ENCOUNTER — Ambulatory Visit (INDEPENDENT_AMBULATORY_CARE_PROVIDER_SITE_OTHER): Payer: No Typology Code available for payment source | Admitting: Physician Assistant

## 2017-02-15 ENCOUNTER — Encounter: Payer: Self-pay | Admitting: Physician Assistant

## 2017-02-15 VITALS — BP 114/73 | HR 64 | Resp 16 | Wt 112.1 lb

## 2017-02-15 DIAGNOSIS — Z800 Family history of malignant neoplasm of digestive organs: Secondary | ICD-10-CM

## 2017-02-15 DIAGNOSIS — R634 Abnormal weight loss: Secondary | ICD-10-CM

## 2017-02-15 DIAGNOSIS — R197 Diarrhea, unspecified: Secondary | ICD-10-CM

## 2017-02-15 NOTE — Progress Notes (Signed)
Subjective:    Patient ID: Kellie [name], female    DOB: 05-Jul-1955, 61 y.o.   MRN: [id number]  HPI  Pt is a 61 yo female with bipolar affective disorder who presents to the clinic concerned about her weight loss over past 6 months and inability to gain it back. Unaware to her she has gained since last appt in July from 109 to 112. Her goal weight is 125. She has frequent loose stools without blood or mucus. They do appear like "acid" sometimes. It seems like a lot foods "go straight through her". She had colonoscopy in 09/2012 that was great. She denies any abdominal pain. Her sister was recently dx with pancreatic cancer and this scares her. She has looked up some things weight loss can be from and concerned today.   .. Active Ambulatory Problems    Diagnosis Date Noted  . GERD (gastroesophageal reflux disease) 09/27/2010  . Allergic rhinitis 11/08/2010  . Insomnia 11/08/2010  . Osteoporosis 11/21/2011  . Borderline intellectual functioning 02/15/2012  . Genital herpes 11/05/2012  . Hemorrhoids 11/05/2012  . Chronic periodontal disease 06/30/2013  . Adhesive capsulitis of right shoulder 11/24/2013  . Prediabetes 12/14/2013  . Postmenopausal atrophic vaginitis 03/17/2014  . Bipolar disorder with depression ([place]) 06/02/2014  . Family history of renal cancer 10/11/2014  . DDD (degenerative disc disease), lumbar 10/11/2014  . Rhinitis, allergic 10/14/2014  . Near syncope 02/09/2015  . Chronic nasal congestion 03/01/2015  . Vitreous floaters of right eye 03/01/2015  . DDD (degenerative disc disease), cervical 07/05/2015  . Radiculitis of right cervical region 07/06/2015  . Cough, persistent 07/21/2015  . Post-nasal drip 07/21/2015  . Left knee pain 11/09/2015  . Pain in joint, lower leg 11/09/2015  . Plantar fasciitis of left foot 11/09/2015  . Hoarseness 03/09/2016  . Pulmonary nodule 08/08/2016  . Bipolar 1 disorder with moderate mania ([place]) 09/17/2016  . Panic attacks  09/18/2016  . SOB (shortness of breath) 09/18/2016  . Weakness 10/05/2016  . Family history of pancreatic cancer 02/18/2017  . Diarrhea 02/18/2017  . Weight decrease 02/18/2017   Resolved Ambulatory Problems    Diagnosis Date Noted  . Sinusitis 09/27/2010  . Depression 09/27/2010  . Bronchitis 12/04/2010  . Cough 06/02/2011  . General medical examination 10/18/2011  . Dizziness 11/21/2011  . Knee pain 11/21/2011  . Low back pain 11/21/2011  . Heel pain 07/14/2012  . Thoracic myofascial strain 03/13/2013  . Sinusitis 04/20/2013  . Cough 04/22/2013  . Rotator cuff disorder 08/01/2013  . Right shoulder pain 10/27/2013  . Adhesive capsulitis 11/02/2013  . Pain in joint, shoulder region 11/24/2013  . Annual physical exam 12/08/2013  . Bipolar disorder ([place]) 04/29/2014  . Right serous otitis media 08/03/2014  . Acute recurrent maxillary sinusitis 10/14/2014  . Right-sided low back pain without sciatica 10/14/2014  . Sinusitis 02/09/2015  . Sacroiliac joint dysfunction of right side 02/25/2015  . Piriformis syndrome 03/01/2015  . Adhesive capsulitis 07/05/2015  . Drug reaction 09/25/2016   Past Medical History:  Diagnosis Date  . Abnormal Pap smear of cervix   . Allergic rhinitis 11/08/2010  . Anxiety   . Asthma   . Bronchitis   . Diabetes mellitus without complication ([place])   . Herpes simplex of female genitalia   . Hyperlipidemia   . Mitral valve prolapse   . Osteoporosis 11/21/2011  . Urticaria    .[name] Kitchen Family History  Problem Relation Age of Onset  . Kidney cancer Mother 15  .  COPD Mother   . Cancer Mother        renal  . Kidney disease Mother   . Allergic rhinitis Mother   . Asthma Mother   . Heart disease Father   . Allergic rhinitis Father   . COPD Sister   . Depression Sister   . Pancreatic cancer Sister   . Cancer Sister   . Heart disease Brother   . Heart attack Brother   . Heart disease Paternal Uncle   . Diabetes Unknown        neice  . Thyroid  disease Unknown        neice  . Depression Maternal Aunt   . Depression Maternal Grandfather   . Colon cancer Neg Hx   . Rectal cancer Neg Hx   . Stomach cancer Neg Hx   . Colon polyps Neg Hx        Review of Systems    see HPI.  Objective:   Physical Exam  Constitutional: She is oriented to person, place, and time. She appears well-developed and well-nourished.  HENT:  Head: Normocephalic and atraumatic.  Cardiovascular: Normal rate, regular rhythm and normal heart sounds.   Abdominal: Soft. Bowel sounds are normal. She exhibits no distension and no mass. There is no tenderness. There is no rebound and no guarding.  Neurological: She is alert and oriented to person, place, and time.  Psychiatric: She has a normal mood and affect. Her behavior is normal.          Assessment & Plan:  [name] KitchenMarland KitchenLillianah was seen today for follow-up.  Diagnoses and all orders for this visit:  Weight decrease -     US Abdomen Complete; Future  Diarrhea, unspecified type -     US Abdomen Complete; Future  Family history of pancreatic cancer -     US Abdomen Complete; Future   Continue to keep calories to gain weight around 1500-1800.  reassured patient that weight is coming up.  Discussed labs that were recent from last hospital visit with normal CBC, CMP, TSH.  With loose stools sounds like patient has some IBS-D and GI isues could be related to mood and stress.  Will get u/s to fully evaluate.

## 2017-02-17 ENCOUNTER — Encounter: Payer: Self-pay | Admitting: Physician Assistant

## 2017-02-18 ENCOUNTER — Encounter (HOSPITAL_COMMUNITY): Payer: Self-pay | Admitting: Psychiatry

## 2017-02-18 ENCOUNTER — Ambulatory Visit (INDEPENDENT_AMBULATORY_CARE_PROVIDER_SITE_OTHER): Payer: Self-pay | Admitting: Psychiatry

## 2017-02-18 DIAGNOSIS — Z818 Family history of other mental and behavioral disorders: Secondary | ICD-10-CM

## 2017-02-18 DIAGNOSIS — F419 Anxiety disorder, unspecified: Secondary | ICD-10-CM

## 2017-02-18 DIAGNOSIS — Z800 Family history of malignant neoplasm of digestive organs: Secondary | ICD-10-CM | POA: Insufficient documentation

## 2017-02-18 DIAGNOSIS — R197 Diarrhea, unspecified: Secondary | ICD-10-CM | POA: Insufficient documentation

## 2017-02-18 DIAGNOSIS — F310 Bipolar disorder, current episode hypomanic: Secondary | ICD-10-CM

## 2017-02-18 DIAGNOSIS — F5102 Adjustment insomnia: Secondary | ICD-10-CM

## 2017-02-18 DIAGNOSIS — R634 Abnormal weight loss: Secondary | ICD-10-CM | POA: Insufficient documentation

## 2017-02-18 MED ORDER — GABAPENTIN 100 MG PO CAPS: 100.0000 mg | ORAL_CAPSULE | Freq: Two times a day (BID) | ORAL | 2 refills | Status: DC

## 2017-02-18 NOTE — Progress Notes (Signed)
Patient ID: Kellie [name], female   DOB: 03-15-1956, 61 y.o.   MRN: [id number]   [facility] Follow up Outpatient visit  Kellie [name] [id number] 61 y.o.  02/18/2017 8:57 AM  Chief Complaint:   Follow up for Bipolar  And anxiety.  History of Present Illness:   Patient  Initially presented with racing toughts and cant sleep at night.  Patient is stopped Lamictal in the past and also Abilify somewhat reluctant to be on most of brother last visit we added gabapentin she is responding better to that less anxious less energy. She takes Celexa as well. Otherwise she is trying to gain weight and keep herself active also takes Xanax if needed sleep has improved as well.   Modifying factor: sister Aggravating factor:  Arguments, pain Severity of depression; 47/10  Worried about age , finances  wants to come after 3-4 months if not changing meds   Past Psychiatric History/Hospitalization(s) denies  Hospitalization for psychiatric illness: No History of Electroconvulsive Shock Therapy: No Prior Suicide Attempts: No  Medical History; Past Medical History:  Diagnosis Date  . Abnormal Pap smear of cervix   . Allergic rhinitis 11/08/2010  . Anxiety   . Asthma   . Bronchitis   . Diabetes mellitus without complication ([place])   . Herpes simplex of female genitalia   . Hyperlipidemia   . Mitral valve prolapse   . Osteoporosis 11/21/2011  . Urticaria     Allergies: Allergies  Allergen Reactions  . Hydrocodone Nausea And Vomiting  . Abilify [Aripiprazole]     Nausea/vomiting/weakness/shaky  . Acyclovir And Related   . Codeine Nausea And Vomiting  . Morphine And Related Nausea And Vomiting  . Percocet [Oxycodone-Acetaminophen] Nausea And Vomiting and Other (See Comments)    Patient states it makes blood pressure drop too much  . Vicodin [Hydrocodone-Acetaminophen] Nausea And Vomiting    Medications: Outpatient Encounter Prescriptions as of 02/18/2017  Medication Sig   . acyclovir (ZOVIRAX) 200 MG capsule Take 2 capsules (400 mg total) by mouth 2 (two) times daily. (Patient not taking: Reported on 02/15/2017)  . alendronate (FOSAMAX) 70 MG tablet Take 1 tablet (70 mg total) by mouth every 7 (seven) days. Take with a full glass of water on an empty stomach.  . ALPRAZolam (XANAX) 0.5 MG tablet TAKE ONE TABLET BY MOUTH ONCE DAILY AS NEEDED FOR ANXIETY  . AMBULATORY NON FORMULARY MEDICATION Glucometer with lancets and test strips to test as needed for hypoglycemia.  #20 strip  . cetirizine (ZYRTEC) 10 MG tablet Take 10 mg by mouth daily.  . Cholecalciferol (VITAMIN D) 2000 units CAPS Take by mouth.  . citalopram (CELEXA) 10 MG tablet Take 1 tablet (10 mg total) by mouth daily.  . famotidine (PEPCID) 10 MG tablet Take 10 mg by mouth 2 (two) times daily.  [name] Kitchen gabapentin (NEURONTIN) 100 MG capsule Take 1 capsule (100 mg total) by mouth 2 (two) times daily.  . montelukast (SINGULAIR) 10 MG tablet Take 1 tablet (10 mg total) by mouth at bedtime.  . mupirocin ointment (BACTROBAN) 2 % Place 1 application into the nose 2 (two) times daily.  . Probiotic Product (PROBIOTIC & ACIDOPHILUS EX ST PO) Take by mouth.  . [DISCONTINUED] gabapentin (NEURONTIN) 100 MG capsule Take 1 capsule (100 mg total) by mouth 2 (two) times daily.   No facility-administered encounter medications on file as of 02/18/2017.      Family History; Family History  Problem Relation Age of Onset  . Kidney  cancer Mother 43  . COPD Mother   . Cancer Mother        renal  . Kidney disease Mother   . Allergic rhinitis Mother   . Asthma Mother   . Heart disease Father   . Allergic rhinitis Father   . COPD Sister   . Depression Sister   . Pancreatic cancer Sister   . Cancer Sister   . Heart disease Brother   . Heart attack Brother   . Heart disease Paternal Uncle   . Diabetes Unknown        neice  . Thyroid disease Unknown        neice  . Depression Maternal Aunt   . Depression Maternal  Grandfather   . Colon cancer Neg Hx   . Rectal cancer Neg Hx   . Stomach cancer Neg Hx   . Colon polyps Neg Hx        Labs:  Recent Results (from the past 2160 hour(s))  Comprehensive metabolic panel     Status: None   Collection Time: 12/17/16 11:15 AM  Result Value Ref Range   Sodium 139 135 - 145 mmol/L   Potassium 4.3 3.5 - 5.1 mmol/L   Chloride 103 101 - 111 mmol/L   CO2 30 22 - 32 mmol/L   Glucose, Bld 82 65 - 99 mg/dL   BUN 18 6 - 20 mg/dL   Creatinine, Ser 0.85 0.44 - 1.00 mg/dL   Calcium 9.5 8.9 - 10.3 mg/dL   Total Protein 7.1 6.5 - 8.1 g/dL   Albumin 4.2 3.5 - 5.0 g/dL   AST 19 15 - 41 U/L   ALT 14 14 - 54 U/L   Alkaline Phosphatase 42 38 - 126 U/L   Total Bilirubin 0.5 0.3 - 1.2 mg/dL   GFR calc non Af Amer >60 >60 mL/min   GFR calc Af Amer >60 >60 mL/min    Comment: (NOTE) The eGFR has been calculated using the CKD EPI equation. This calculation has not been validated in all clinical situations. eGFR's persistently <60 mL/min signify possible Chronic Kidney Disease.    Anion gap 6 5 - 15  CBC with Differential     Status: None   Collection Time: 12/17/16 11:15 AM  Result Value Ref Range   WBC 4.2 4.0 - 10.5 K/uL   RBC 4.09 3.87 - 5.11 MIL/uL   Hemoglobin 12.8 12.0 - 15.0 g/dL   HCT 37.8 36.0 - 46.0 %   MCV 92.4 78.0 - 100.0 fL   MCH 31.3 26.0 - 34.0 pg   MCHC 33.9 30.0 - 36.0 g/dL   RDW 12.6 11.5 - 15.5 %   Platelets 286 150 - 400 K/uL   Neutrophils Relative % 66 %   Neutro Abs 2.8 1.7 - 7.7 K/uL   Lymphocytes Relative 22 %   Lymphs Abs 0.9 0.7 - 4.0 K/uL   Monocytes Relative 9 %   Monocytes Absolute 0.4 0.1 - 1.0 K/uL   Eosinophils Relative 2 %   Eosinophils Absolute 0.1 0.0 - 0.7 K/uL   Basophils Relative 1 %   Basophils Absolute 0.0 0.0 - 0.1 K/uL  Troponin I     Status: None   Collection Time: 12/17/16 11:15 AM  Result Value Ref Range   Troponin I <0.03 <0.03 ng/mL  D-dimer, quantitative (not at [facility])     Status: Abnormal   Collection  Time: 12/17/16 11:15 AM  Result Value Ref Range   D-Dimer, Quant 0.93 (H)  0.00 - 0.50 ug/mL-FEU    Comment: (NOTE) At the manufacturer cut-off of 0.50 ug/mL FEU, this assay has been documented to exclude PE with a sensitivity and negative predictive value of 97 to 99%.  At this time, this assay has not been approved by the FDA to exclude DVT/VTE. Results should be correlated with clinical presentation.   Lipase, blood     Status: None   Collection Time: 12/17/16 11:15 AM  Result Value Ref Range   Lipase 40 11 - 51 U/L  Magnesium     Status: None   Collection Time: 12/17/16 11:15 AM  Result Value Ref Range   Magnesium 2.2 1.7 - 2.4 mg/dL  TSH     Status: None   Collection Time: 12/17/16 11:41 AM  Result Value Ref Range   TSH 1.357 0.350 - 4.500 uIU/mL    Comment: Performed by a 3rd Generation assay with a functional sensitivity of <=0.01 uIU/mL. Performed at [facility], [place] [address] [place], [place] [address]   Urinalysis, Routine w reflex microscopic     Status: None   Collection Time: 12/17/16 12:20 PM  Result Value Ref Range   Color, Urine YELLOW YELLOW   APPearance CLEAR CLEAR   Specific Gravity, Urine 1.016 1.005 - 1.030   pH 6.5 5.0 - 8.0   Glucose, UA NEGATIVE NEGATIVE mg/dL   Hgb urine dipstick NEGATIVE NEGATIVE   Bilirubin Urine NEGATIVE NEGATIVE   Ketones, ur NEGATIVE NEGATIVE mg/dL   Protein, ur NEGATIVE NEGATIVE mg/dL   Nitrite NEGATIVE NEGATIVE   Leukocytes, UA NEGATIVE NEGATIVE    Comment: Microscopic not done on urines with negative protein, blood, leukocytes, nitrite, or glucose < 500 mg/dL.  Urine culture     Status: None   Collection Time: 12/17/16 12:20 PM  Result Value Ref Range   Specimen Description URINE, RANDOM    Special Requests NONE    Culture      NO GROWTH Performed at [facility], [place] [address], [place], [place] [address]    Report Status 12/18/2016 FINAL      Mental Status Examination;   Psychiatric Specialty  Exam: Physical Exam  Constitutional: She appears well-developed and well-nourished.  Skin: She is not diaphoretic.    Review of Systems  Constitutional: Negative for fever.  Cardiovascular: Negative for palpitations.  Skin: Negative for rash.  Neurological: Negative for tremors and headaches.  Psychiatric/Behavioral: Negative for suicidal ideas.    There were no vitals taken for this visit.There is no height or weight on file to calculate BMI.  General Appearance: Casual  Eye Contact::  Fair  Speech:  coherent  Volume:  Normal  Mood: euthymic  Affect: reactive  Thought Process: clear . No psychosis  Orientation:  Full (Time, Place, and Person)  Thought Content:  Rumination  Suicidal Thoughts:  No  Homicidal Thoughts:  No  Memory:  Immediate;   Fair Recent;   Fair  Judgement:  Fair  Insight:  Shallow  Psychomotor Activity:  Increased  Concentration:  Fair  Recall:  Fair  Akathisia:  Negative  Handed:  Right  AIMS (if indicated):     Assets:  Desire for Improvement Physical Health Transportation  Sleep:        Assessment: Axis I: bipolar disorder II unspecified or depressed type. Anxiety disorder NOS. Insomnia  Axis II: deferred  Axis III:  Past Medical History:  Diagnosis Date  . Abnormal Pap smear of cervix   . Allergic rhinitis 11/08/2010  .  Anxiety   . Asthma   . Bronchitis   . Diabetes mellitus without complication ([place])   . Herpes simplex of female genitalia   . Hyperlipidemia   . Mitral valve prolapse   . Osteoporosis 11/21/2011  . Urticaria     Axis IV: psychosocial   Treatment Plan and Summary:  Bipolar : less edgy. Keep small dose of gaba 176m bid  GAD: fluctuates. Continue celexa. Not worse Xanax prn Insomnia:baseline. Not worse. Takes trazadone or xanax prn.   Provided supportive therapy she is also seeing a therapist once a month. Overall improved mood symptoms must come back in 4 months reviewed side effects and concerns were  addressed. Gaba prescription sent.   AMerian Capron MD 02/18/2017

## 2017-02-21 ENCOUNTER — Ambulatory Visit (INDEPENDENT_AMBULATORY_CARE_PROVIDER_SITE_OTHER): Payer: Self-pay

## 2017-02-21 DIAGNOSIS — R197 Diarrhea, unspecified: Secondary | ICD-10-CM

## 2017-02-21 DIAGNOSIS — Z800 Family history of malignant neoplasm of digestive organs: Secondary | ICD-10-CM

## 2017-02-21 DIAGNOSIS — R634 Abnormal weight loss: Secondary | ICD-10-CM

## 2017-02-26 ENCOUNTER — Ambulatory Visit (INDEPENDENT_AMBULATORY_CARE_PROVIDER_SITE_OTHER): Payer: Self-pay | Admitting: Licensed Clinical Social Worker

## 2017-02-26 DIAGNOSIS — F310 Bipolar disorder, current episode hypomanic: Secondary | ICD-10-CM
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
## 2017-03-12 ENCOUNTER — Ambulatory Visit (HOSPITAL_COMMUNITY): Payer: Self-pay | Admitting: Licensed Clinical Social Worker

## 2017-03-18 ENCOUNTER — Other Ambulatory Visit: Payer: Self-pay | Admitting: Physician Assistant

## 2017-03-18 MED ORDER — CITALOPRAM HYDROBROMIDE 10 MG PO TABS: 10.0000 mg | ORAL_TABLET | Freq: Every day | ORAL | 5 refills | Status: DC

## 2017-03-20 ENCOUNTER — Telehealth: Payer: Self-pay | Admitting: Physician Assistant

## 2017-03-20 ENCOUNTER — Ambulatory Visit (HOSPITAL_COMMUNITY): Payer: Self-pay | Admitting: Licensed Clinical Social Worker

## 2017-03-20 NOTE — Telephone Encounter (Signed)
Patient called adv that she only wants [facility] listed as her preferred pharmacy. You can remove all the other pharmacies. Pt stated that she told Jade she needed her Xanax sent to [facility] and that her Celexa was sent to [facility] but she was able to have that script transferred to [facility]. Thanks

## 2017-03-20 NOTE — Telephone Encounter (Signed)
Removed all pharmacies except [facility].

## 2017-03-21 ENCOUNTER — Other Ambulatory Visit: Payer: Self-pay | Admitting: *Deleted

## 2017-03-21 MED ORDER — CITALOPRAM HYDROBROMIDE 10 MG PO TABS: 10.0000 mg | ORAL_TABLET | Freq: Every day | ORAL | 5 refills | Status: DC

## 2017-03-21 MED ORDER — ALPRAZOLAM 0.5 MG PO TABS: ORAL_TABLET | ORAL | 2 refills | Status: DC

## 2017-03-22 ENCOUNTER — Other Ambulatory Visit: Payer: Self-pay | Admitting: Physician Assistant

## 2017-03-26 ENCOUNTER — Other Ambulatory Visit: Payer: Self-pay | Admitting: Physician Assistant

## 2017-03-26 DIAGNOSIS — Z1239 Encounter for other screening for malignant neoplasm of breast: Secondary | ICD-10-CM

## 2017-04-03 ENCOUNTER — Ambulatory Visit (INDEPENDENT_AMBULATORY_CARE_PROVIDER_SITE_OTHER): Payer: No Typology Code available for payment source

## 2017-04-03 ENCOUNTER — Ambulatory Visit: Payer: Self-pay

## 2017-04-03 DIAGNOSIS — Z1231 Encounter for screening mammogram for malignant neoplasm of breast: Secondary | ICD-10-CM

## 2017-04-03 DIAGNOSIS — Z1239 Encounter for other screening for malignant neoplasm of breast: Secondary | ICD-10-CM

## 2017-04-11 ENCOUNTER — Encounter (HOSPITAL_COMMUNITY): Payer: Self-pay | Admitting: Licensed Clinical Social Worker

## 2017-04-11 ENCOUNTER — Ambulatory Visit (HOSPITAL_COMMUNITY): Payer: Self-pay | Admitting: Licensed Clinical Social Worker

## 2017-04-29 ENCOUNTER — Ambulatory Visit: Payer: Self-pay | Admitting: Physician Assistant

## 2017-04-29 ENCOUNTER — Ambulatory Visit (INDEPENDENT_AMBULATORY_CARE_PROVIDER_SITE_OTHER): Payer: No Typology Code available for payment source | Admitting: Family Medicine

## 2017-04-29 ENCOUNTER — Encounter: Payer: Self-pay | Admitting: Family Medicine

## 2017-04-29 VITALS — BP 107/66 | HR 80 | Ht 66.0 in | Wt 111.0 lb

## 2017-04-29 DIAGNOSIS — M545 Low back pain: Secondary | ICD-10-CM

## 2017-04-29 DIAGNOSIS — G8929 Other chronic pain: Secondary | ICD-10-CM

## 2017-04-29 DIAGNOSIS — R197 Diarrhea, unspecified: Secondary | ICD-10-CM

## 2017-04-29 DIAGNOSIS — L299 Pruritus, unspecified: Secondary | ICD-10-CM

## 2017-04-29 DIAGNOSIS — R634 Abnormal weight loss: Secondary | ICD-10-CM

## 2017-04-29 DIAGNOSIS — R0602 Shortness of breath: Secondary | ICD-10-CM
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
## 2017-05-03 LAB — VITAMIN B12: VITAMIN B 12: 480 pg/mL (ref 200–1100)

## 2017-05-03 LAB — IODINE, RANDOM URINE: IODINE, RANDOM URINE: 127 ug/L (ref 34–523)

## 2017-05-03 LAB — THYROID PEROXIDASE ANTIBODY: Thyroperoxidase Ab SerPl-aCnc: <1 IU/mL (ref <9)

## 2017-05-03 LAB — LIPID PANEL W/REFLEX DIRECT LDL
Cholesterol: 260 mg/dL — ABNORMAL HIGH (ref <200)
HDL: 106 mg/dL (ref >50)
LDL Cholesterol (Calc): 139 mg/dL (calc) — ABNORMAL HIGH
NON-HDL CHOLESTEROL (CALC): 154 mg/dL — AB (ref <130)
Total CHOL/HDL Ratio: 2.5 (calc) (ref <5.0)
Triglycerides: 63 mg/dL (ref <150)

## 2017-05-03 LAB — VITAMIN D 25 HYDROXY (VIT D DEFICIENCY, FRACTURES): Vit D, 25-Hydroxy: 36 ng/mL (ref 30–100)

## 2017-05-03 LAB — SEDIMENTATION RATE: Sed Rate: 9 mm/h (ref 0–30)

## 2017-05-03 LAB — T3, FREE: T3 FREE: 2.8 pg/mL (ref 2.3–4.2)

## 2017-05-03 LAB — FOLATE: FOLATE: 18.1 ng/mL

## 2017-05-03 LAB — C-REACTIVE PROTEIN: CRP: 0.5 mg/L (ref <8.0)

## 2017-05-03 LAB — TISSUE TRANSGLUTAMINASE ABS,IGG,IGA
(tTG) Ab, IgA: 1 U/mL
(tTG) Ab, IgG: 1 U/mL

## 2017-05-03 LAB — TSH: TSH: 1.54 m[IU]/L (ref 0.40–4.50)

## 2017-05-03 LAB — T4, FREE: FREE T4: 1.1 ng/dL (ref 0.8–1.8)

## 2017-05-07 ENCOUNTER — Telehealth: Payer: Self-pay | Admitting: *Deleted

## 2017-05-07 NOTE — Telephone Encounter (Signed)
Patient is requesting doxycycline 100mg  to keep on hand when she needs it.

## 2017-05-07 NOTE — Telephone Encounter (Signed)
What does she take it for?  I did not have it on her medication list.  And which one does she take?

## 2017-05-09 NOTE — Telephone Encounter (Signed)
She wants it to "have on hand." I will tell her antibiotic require an office visit and we do not prescribe antibiotics to keep on hand

## 2017-05-14 ENCOUNTER — Ambulatory Visit (INDEPENDENT_AMBULATORY_CARE_PROVIDER_SITE_OTHER): Payer: No Typology Code available for payment source | Admitting: Physician Assistant

## 2017-05-14 ENCOUNTER — Encounter: Payer: Self-pay | Admitting: Physician Assistant

## 2017-05-14 VITALS — BP 109/61 | HR 72 | Wt 111.0 lb

## 2017-05-14 DIAGNOSIS — R634 Abnormal weight loss: Secondary | ICD-10-CM

## 2017-05-14 DIAGNOSIS — E7800 Pure hypercholesterolemia, unspecified: Secondary | ICD-10-CM

## 2017-05-14 DIAGNOSIS — K582 Mixed irritable bowel syndrome: Secondary | ICD-10-CM

## 2017-05-14 DIAGNOSIS — R55 Syncope and collapse: Secondary | ICD-10-CM

## 2017-05-14 MED ORDER — AMBULATORY NON FORMULARY MEDICATION: 1 refills | Status: DC

## 2017-05-14 NOTE — Progress Notes (Signed)
Subjective:    Patient ID: Kellie [name], female    DOB: 10-Apr-1956, 61 y.o.   MRN: [id number]  HPI  Pt is a 61 yo pleasant female who presents to the clinic to discuss weight. Pt states her baseline is 120's and she is not doing anything different but losing weight. From august she has lost 1lb from 11/5 she has stayed the same at 111lbs. Pt has a sister who is dying from pancreatic cancer and patient is very concerned that she has some type of cancer causing her to lose weight and not be able to gain it back. She does not bring in food diary but reports to be eating about 1500 calories a day.pt does feel some link to GI upset with dairy products.  She takes vitamin D every day. Her energy is good. Her stools have not changed but alterate between diarrhea and constipation. Denies any abodominal pain, dysuria, cough.   Needs testing strips for low blood sugar testing.   .. Active Ambulatory Problems    Diagnosis Date Noted  . GERD (gastroesophageal reflux disease) 09/27/2010  . Allergic rhinitis 11/08/2010  . Insomnia 11/08/2010  . Osteoporosis 11/21/2011  . Borderline intellectual functioning 02/15/2012  . Genital herpes 11/05/2012  . Hemorrhoids 11/05/2012  . Chronic periodontal disease 06/30/2013  . Adhesive capsulitis of right shoulder 11/24/2013  . Prediabetes 12/14/2013  . Postmenopausal atrophic vaginitis 03/17/2014  . Bipolar disorder with depression ([place]) 06/02/2014  . Family history of renal cancer 10/11/2014  . DDD (degenerative disc disease), lumbar 10/11/2014  . Rhinitis, allergic 10/14/2014  . Near syncope 02/09/2015  . Chronic nasal congestion 03/01/2015  . Vitreous floaters of right eye 03/01/2015  . DDD (degenerative disc disease), cervical 07/05/2015  . Radiculitis of right cervical region 07/06/2015  . Cough, persistent 07/21/2015  . Post-nasal drip 07/21/2015  . Left knee pain 11/09/2015  . Pain in joint, lower leg 11/09/2015  . Plantar fasciitis of left foot  11/09/2015  . Hoarseness 03/09/2016  . Pulmonary nodule 08/08/2016  . Bipolar 1 disorder with moderate mania ([place]) 09/17/2016  . Panic attacks 09/18/2016  . SOB (shortness of breath) 09/18/2016  . Weakness 10/05/2016  . Family history of pancreatic cancer 02/18/2017  . Diarrhea 02/18/2017  . Weight decrease 02/18/2017  . Unintentional weight loss 05/18/2017  . Elevated LDL cholesterol level 05/18/2017  . Hyperlipidemia 05/18/2017  . Irritable bowel syndrome with both constipation and diarrhea 05/18/2017   Resolved Ambulatory Problems    Diagnosis Date Noted  . Sinusitis 09/27/2010  . Depression 09/27/2010  . Bronchitis 12/04/2010  . Cough 06/02/2011  . General medical examination 10/18/2011  . Dizziness 11/21/2011  . Knee pain 11/21/2011  . Low back pain 11/21/2011  . Heel pain 07/14/2012  . Thoracic myofascial strain 03/13/2013  . Sinusitis 04/20/2013  . Cough 04/22/2013  . Rotator cuff disorder 08/01/2013  . Right shoulder pain 10/27/2013  . Adhesive capsulitis 11/02/2013  . Pain in joint, shoulder region 11/24/2013  . Annual physical exam 12/08/2013  . Bipolar disorder ([place]) 04/29/2014  . Right serous otitis media 08/03/2014  . Acute recurrent maxillary sinusitis 10/14/2014  . Right-sided low back pain without sciatica 10/14/2014  . Sinusitis 02/09/2015  . Sacroiliac joint dysfunction of right side 02/25/2015  . Piriformis syndrome 03/01/2015  . Adhesive capsulitis 07/05/2015  . Drug reaction 09/25/2016   Past Medical History:  Diagnosis Date  . Abnormal Pap smear of cervix   . Allergic rhinitis 11/08/2010  . Anxiety   .  Asthma   . Bronchitis   . Diabetes mellitus without complication ([place])   . Herpes simplex of female genitalia   . Hyperlipidemia   . Mitral valve prolapse   . Osteoporosis 11/21/2011  . Urticaria         Review of Systems  All other systems reviewed and are negative.      Objective:   Physical Exam  Constitutional: She is  oriented to person, place, and time. She appears well-developed and well-nourished.  HENT:  Head: Normocephalic and atraumatic.  Cardiovascular: Normal rate, regular rhythm and normal heart sounds.  Pulmonary/Chest: Effort normal and breath sounds normal. She has no wheezes.  No CVA tenderness.   Abdominal: Soft. Bowel sounds are normal. She exhibits no distension and no mass. There is no tenderness. There is no rebound and no guarding.  Neurological: She is alert and oriented to person, place, and time.  Skin: Skin is dry.  Psychiatric: She has a normal mood and affect. Her behavior is normal.          Assessment & Plan:  [name] KitchenMarland KitchenVaretta was seen today for food intolerance.  Diagnoses and all orders for this visit:  Unintentional weight loss -     Food Allergy Profile -     Interpretation:  Pure hypercholesterolemia -     Interpretation:  Irritable bowel syndrome with both constipation and diarrhea -     Interpretation:  Near syncope -     AMBULATORY NON FORMULARY MEDICATION; One touch  Delica for checking glucose up to three times a week for hypoglycemia. -     Interpretation:   Will do food allergy testing.  To start ruling out cancers we could get Pap up to date.  She has colonoscopy/mammogram up to date.  U/s of abdomen has been done with no acute findings. If weight loss or any abdominal pain occurs consider CT of abdomen.  Reassuring that weight is staying stable. Continue to push nutrition and stried to get 2000 calories a day.  Certainly not out of the question that stress is causing weight loss.   Continue to as needed test blood sugar when feel symptomatic. Conttinue to eat frequent meals to avoid any hypoglycemia.

## 2017-05-15 LAB — FOOD ALLERGY PROFILE
CLASS: 0
CLASS: 0
CLASS: 0
CLASS: 0
CLASS: 0
CLASS: 0
CLASS: 0
CLASS: 0
CLASS: 0
CLASS: 0
CLASS: 0
Cashew IgE: <0.1 kU/L
Class: 0
Class: 0
Class: 0
Class: 0
Egg White IgE: <0.1 kU/L
Fish Cod: <0.1 kU/L
Hazelnut: <0.1 kU/L
Milk IgE: <0.1 kU/L
Peanut IgE: <0.1 kU/L
Sesame Seed f10: <0.1 kU/L
Shrimp IgE: <0.1 kU/L
Soybean IgE: <0.1 kU/L
Tuna IgE: <0.1 kU/L
Wheat IgE: <0.1 kU/L

## 2017-05-15 LAB — INTERPRETATION:

## 2017-05-18 ENCOUNTER — Encounter: Payer: Self-pay | Admitting: Physician Assistant

## 2017-05-18 DIAGNOSIS — R634 Abnormal weight loss: Secondary | ICD-10-CM | POA: Insufficient documentation

## 2017-05-18 DIAGNOSIS — K582 Mixed irritable bowel syndrome: Secondary | ICD-10-CM | POA: Insufficient documentation

## 2017-05-18 DIAGNOSIS — E785 Hyperlipidemia, unspecified: Secondary | ICD-10-CM | POA: Insufficient documentation

## 2017-05-18 DIAGNOSIS — E7800 Pure hypercholesterolemia, unspecified: Secondary | ICD-10-CM | POA: Insufficient documentation

## 2017-05-20 ENCOUNTER — Other Ambulatory Visit (HOSPITAL_COMMUNITY): Payer: Self-pay | Admitting: Psychiatry

## 2017-05-28 ENCOUNTER — Other Ambulatory Visit: Payer: Self-pay

## 2017-05-28 ENCOUNTER — Ambulatory Visit (INDEPENDENT_AMBULATORY_CARE_PROVIDER_SITE_OTHER): Payer: Self-pay | Admitting: Psychiatry

## 2017-05-28 ENCOUNTER — Encounter (HOSPITAL_COMMUNITY): Payer: Self-pay | Admitting: Psychiatry

## 2017-05-28 VITALS — BP 122/68 | HR 79 | Ht 66.0 in | Wt 112.2 lb

## 2017-05-28 DIAGNOSIS — F310 Bipolar disorder, current episode hypomanic: Secondary | ICD-10-CM

## 2017-05-28 DIAGNOSIS — Z818 Family history of other mental and behavioral disorders: Secondary | ICD-10-CM

## 2017-05-28 DIAGNOSIS — F419 Anxiety disorder, unspecified: Secondary | ICD-10-CM

## 2017-05-28 DIAGNOSIS — F5102 Adjustment insomnia: Secondary | ICD-10-CM

## 2017-05-28 MED ORDER — GABAPENTIN 100 MG PO CAPS: 100.0000 mg | ORAL_CAPSULE | Freq: Three times a day (TID) | ORAL | 4 refills | Status: DC

## 2017-05-28 NOTE — Progress Notes (Signed)
Patient ID: Kellie [name], female   DOB: 1956/03/14, 61 y.o.   MRN: [id number]   [facility] Follow up Outpatient visit  Kellie [name] [id number] 61 y.o.  05/28/2017 1:17 PM  Chief Complaint:   Follow up for Bipolar  And anxiety.  History of Present Illness:   Patient  Initially presented with racing toughts and cant sleep at night. Gabapentin is keeping her mood and some bowel and she does get agitated at times. Modifying factors are sister sometimes pain and arguments makes her more stressful severity depression is manageable same 7 out of 10. Her sleep is reasonable at times she has seen a shadow or small things cannot have a full figure out. But in the plate at times infrequently but she does not worry too much about it. Anxiety more during evening Worried about age , finances,     Past Psychiatric History/Hospitalization(s) denies  Hospitalization for psychiatric illness: No History of Electroconvulsive Shock Therapy: No Prior Suicide Attempts: No  Medical History; Past Medical History:  Diagnosis Date  . Abnormal Pap smear of cervix   . Allergic rhinitis 11/08/2010  . Anxiety   . Asthma   . Bronchitis   . Diabetes mellitus without complication ([place])   . Herpes simplex of female genitalia   . Hyperlipidemia   . Mitral valve prolapse   . Osteoporosis 11/21/2011  . Urticaria     Allergies: Allergies  Allergen Reactions  . Hydrocodone Nausea And Vomiting  . Abilify [Aripiprazole]     Nausea/vomiting/weakness/shaky  . Acyclovir And Related   . Codeine Nausea And Vomiting  . Morphine And Related Nausea And Vomiting  . Percocet [Oxycodone-Acetaminophen] Nausea And Vomiting and Other (See Comments)    Patient states it makes blood pressure drop too much  . Vicodin [Hydrocodone-Acetaminophen] Nausea And Vomiting    Medications: Outpatient Encounter Medications as of 05/28/2017  Medication Sig  . acyclovir (ZOVIRAX) 200 MG capsule Take 2 capsules  (400 mg total) by mouth 2 (two) times daily.  [name] Kitchen alendronate (FOSAMAX) 70 MG tablet Take 1 tablet (70 mg total) by mouth every 7 (seven) days. Take with a full glass of water on an empty stomach.  . ALPRAZolam (XANAX) 0.5 MG tablet TAKE 1 TABLET ONCE DAILY AS NEEDED FOR ANXIETY.  [name] Kitchen AMBULATORY NON FORMULARY MEDICATION Glucometer with lancets and test strips to test as needed for hypoglycemia.  #20 strip  . AMBULATORY NON FORMULARY MEDICATION One touch  Delica for checking glucose up to three times a week for hypoglycemia.  [name] Kitchen cetirizine (ZYRTEC) 10 MG tablet Take 10 mg by mouth daily.  . Cholecalciferol (VITAMIN D) 2000 units CAPS Take by mouth.  . citalopram (CELEXA) 10 MG tablet Take 1 tablet (10 mg total) by mouth daily.  . famotidine (PEPCID) 10 MG tablet Take 10 mg by mouth 2 (two) times daily.  [name] Kitchen gabapentin (NEURONTIN) 100 MG capsule Take 1 capsule (100 mg total) by mouth 3 (three) times daily.  . montelukast (SINGULAIR) 10 MG tablet Take 1 tablet (10 mg total) by mouth at bedtime.  . mupirocin ointment (BACTROBAN) 2 % Place 1 application into the nose 2 (two) times daily.  . Probiotic Product (PROBIOTIC & ACIDOPHILUS EX ST PO) Take by mouth.  . [DISCONTINUED] gabapentin (NEURONTIN) 100 MG capsule Take 1 capsule (100 mg total) by mouth 2 (two) times daily.   No facility-administered encounter medications on file as of 05/28/2017.      Family History; Family History  Problem Relation Age  of Onset  . Kidney cancer Mother 53  . COPD Mother   . Cancer Mother        renal  . Kidney disease Mother   . Allergic rhinitis Mother   . Asthma Mother   . Heart disease Father   . Allergic rhinitis Father   . COPD Sister   . Depression Sister   . Pancreatic cancer Sister   . Cancer Sister   . Heart disease Brother   . Heart attack Brother   . Heart disease Paternal Uncle   . Diabetes Unknown        neice  . Thyroid disease Unknown        neice  . Depression Maternal Aunt   . Depression  Maternal Grandfather   . Hypothyroidism Sister   . Hypothyroidism Sister   . Colon cancer Neg Hx   . Rectal cancer Neg Hx   . Stomach cancer Neg Hx   . Colon polyps Neg Hx        Labs:  Recent Results (from the past 2160 hour(s))  Sedimentation rate     Status: None   Collection Time: 05/01/17  9:36 AM  Result Value Ref Range   Sed Rate 9 0 - 30 mm/h  C-reactive protein     Status: None   Collection Time: 05/01/17  9:36 AM  Result Value Ref Range   CRP 0.5 <8.0 mg/L  Tissue Transglutaminase Abs,IgG,IgA     Status: None   Collection Time: 05/01/17  9:36 AM  Result Value Ref Range   (tTG) Ab, IgG 1 U/mL    Comment: .        Value      Interpretation        -----      --------------        <6         No Antibody Detected        > or = 6   Antibody Detected .    (tTG) Ab, IgA 1 U/mL    Comment: .        Value      Interpretation        -----      --------------        <4         No Antibody Detected        > or = 4   Antibody Detected .   B12     Status: None   Collection Time: 05/01/17  9:36 AM  Result Value Ref Range   Vitamin B-12 480 200 - 1,100 pg/mL  Folate     Status: None   Collection Time: 05/01/17  9:36 AM  Result Value Ref Range   Folate 18.1 ng/mL    Comment:                            Reference Range                            Low:           <3.4                            Borderline:    3.4-5.4  Normal:        >5.4 .   Lipid Panel w/reflex Direct LDL     Status: Abnormal   Collection Time: 05/01/17  9:36 AM  Result Value Ref Range   Cholesterol 260 (H) <200 mg/dL   HDL 106 >50 mg/dL   Triglycerides 63 <150 mg/dL   LDL Cholesterol (Calc) 139 (H) mg/dL (calc)    Comment: Reference range: <100 . Desirable range <100 mg/dL for primary prevention;   <70 mg/dL for patients with CHD or diabetic patients  with > or = 2 CHD risk factors. [name] Kitchen LDL-C is now calculated using the Martin-Hopkins  calculation, which is a  validated novel method providing  better accuracy than the Friedewald equation in the  estimation of LDL-C.  Cresenciano Genre et al. Annamaria Helling. 4627;035(00): 2061-2068  (http://education.[facility].com/faq/FAQ164)    Total CHOL/HDL Ratio 2.5 <5.0 (calc)   Non-HDL Cholesterol (Calc) 154 (H) <130 mg/dL (calc)    Comment: For patients with diabetes plus 1 major ASCVD risk  factor, treating to a non-HDL-C goal of <100 mg/dL  (LDL-C of <70 mg/dL) is considered a therapeutic  option.   Iodine, Random Urine     Status: None   Collection Time: 05/01/17  9:36 AM  Result Value Ref Range   Iodine, Random Urine 127 34 - 523 mcg/L    Comment: . This test was developed and its analytical performance characteristics have been determined by [place], [place]. It has not been cleared or approved by the U.S. Food and Drug Administration. This assay has been validated pursuant to the CLIA regulations and is used for clinical purposes. [name] Kitchen   VITAMIN D 25 Hydroxy (Vit-D Deficiency, Fractures)     Status: None   Collection Time: 05/01/17  9:36 AM  Result Value Ref Range   Vit D, 25-Hydroxy 36 30 - 100 ng/mL    Comment: Vitamin D Status         25-OH Vitamin D: . Deficiency:                    <20 ng/mL Insufficiency:             20 - 29 ng/mL Optimal:                 > or = 30 ng/mL . For 25-OH Vitamin D testing on patients on  D2-supplementation and patients for whom quantitation  of D2 and D3 fractions is required, the QuestAssureD(TM) 25-OH VIT D, (D2,D3), LC/MS/MS is recommended: order  code [id number] (patients >51yrs). . For more information on this test, go to: http://education.[facility].com/faq/FAQ163 (This link is being provided for  informational/educational purposes only.)   T4, free     Status: None   Collection Time: 05/01/17  9:36 AM  Result Value Ref Range   Free T4 1.1 0.8 - 1.8 ng/dL  T3, free     Status: None   Collection Time: 05/01/17  9:36  AM  Result Value Ref Range   T3, Free 2.8 2.3 - 4.2 pg/mL  Thyroid peroxidase antibody     Status: None   Collection Time: 05/01/17  9:36 AM  Result Value Ref Range   Thyroperoxidase Ab SerPl-aCnc <1 <9 IU/mL  TSH     Status: None   Collection Time: 05/01/17  9:36 AM  Result Value Ref Range   TSH 1.54 0.40 - 4.50 mIU/L  Food Allergy Profile     Status: None   Collection Time: 05/14/17  3:51  PM  Result Value Ref Range   Egg White IgE <0.10 kU/L   Class 0    Peanut IgE <0.10 kU/L   Class 0    Wheat IgE <0.10 kU/L   CLASS 0    Walnut <0.10 kU/L   CLASS 0    Fish Cod <0.10 kU/L   CLASS 0    Milk IgE <0.10 kU/L   Class 0    Soybean IgE <0.10 kU/L   CLASS 0    Shrimp IgE <0.10 kU/L   Class 0    Scallop IgE <0.10 kU/L   CLASS 0    Sesame Seed f10  <0.10 kU/L   CLASS 0    Hazelnut <0.10 kU/L   CLASS 0    Cashew IgE <0.10 kU/L   CLASS 0    Almonds <0.10 kU/L   CLASS 0    Allergen, Salmon, f41 <0.10 kU/L   CLASS 0    Tuna IgE <0.10 kU/L   CLASS 0   Interpretation:     Status: None   Collection Time: 05/14/17  3:51 PM  Result Value Ref Range   Interpretation      Comment: . Specific                        Level of Allergen IGE Class      kU/L             Specific IGE Antibody  -----         ---------        -------------------   0              <0.10           Absent/Undetectable   0/1        0.10-0.34           Very Low Level   1          0.35-0.69           Low Level   2          0.70-3.49           Moderate Level   3          3.50-17.4           High Level   4          17.5-49.9           Very High Level   5            50-100            Very High Level   6              >100            Very High Level . The clinical relevance of allergen results of 0.10-0.34 kU/L are undetermined and intended for  specialist use. . Allergens denoted with a "**" include results using one or more analyte specific reagents. In those cases, the test was developed and its  analytical performance characteristics have been determined by [employer]. It has not been cleared or approved by the U.S. Food and Drug Administration. This assay  has been v alidated pursuant to the [employer]  and is used for clinical purposes.      Mental Status Examination;   Psychiatric Specialty Exam: Physical Exam  Constitutional: She appears well-developed and well-nourished.  Skin: She is not diaphoretic.  Review of Systems  Constitutional: Negative for fever.  Cardiovascular: Negative for chest pain.  Skin: Negative for rash.  Neurological: Negative for tremors and headaches.  Psychiatric/Behavioral: Negative for depression and suicidal ideas.    Blood pressure 122/68, pulse 79, height 5\' 6"  (1.676 m), weight 112 lb 3.2 oz (50.9 kg).Body mass index is 18.11 kg/m.  General Appearance: Casual  Eye Contact::  Fair  Speech:  coherent  Volume:  Normal  Mood: fair  Affect: reactive  Thought Process: clear . No psychosis  Orientation:  Full (Time, Place, and Person)  Thought Content:  Rumination  Suicidal Thoughts:  No  Homicidal Thoughts:  No  Memory:  Immediate;   Fair Recent;   Fair  Judgement:  Fair  Insight:  Shallow  Psychomotor Activity:  Increased  Concentration:  Fair  Recall:  Fair  Akathisia:  Negative  Handed:  Right  AIMS (if indicated):     Assets:  Desire for Improvement Physical Health Transportation  Sleep:        Assessment: Axis I: bipolar disorder II unspecified or depressed type. Anxiety disorder NOS. Insomnia  Axis II: deferred  Axis III:  Past Medical History:  Diagnosis Date  . Abnormal Pap smear of cervix   . Allergic rhinitis 11/08/2010  . Anxiety   . Asthma   . Bronchitis   . Diabetes mellitus without complication ([place])   . Herpes simplex of female genitalia   . Hyperlipidemia   . Mitral valve prolapse   . Osteoporosis 11/21/2011  . Urticaria     Axis IV: psychosocial   Treatment Plan and  Summary:  Bipolar : infrequent agitated. Increase gaba to tid.   GAD: not worse. Continue xanax prn and celexa from primary care  Insomnia: fair. Not worse. Reviewed sleep hygiene In case she is having more frequent hallucinations visual. Need to come early . For now will increae gaba . Follow up with primary care as she eats adequate but has lost weight   Questions addressed FU 4-5 months.  Merian Capron, MD 05/28/2017

## 2017-06-04 ENCOUNTER — Telehealth: Payer: Self-pay | Admitting: *Deleted

## 2017-06-04 ENCOUNTER — Other Ambulatory Visit: Payer: Self-pay | Admitting: Physician Assistant

## 2017-06-04 DIAGNOSIS — Z124 Encounter for screening for malignant neoplasm of cervix: Secondary | ICD-10-CM

## 2017-06-04 DIAGNOSIS — R634 Abnormal weight loss: Secondary | ICD-10-CM

## 2017-06-04 NOTE — Telephone Encounter (Signed)
Pt.notified

## 2017-06-04 NOTE — Telephone Encounter (Signed)
Pt left vm stating that she was supposed to have a referral to an OBGYN in [place] but I don't see one in her chart.  Please advise.

## 2017-06-04 NOTE — Telephone Encounter (Signed)
That is weird. I specifically did this the day I talked her. I placed both endocrinology and gyn referral same day.

## 2017-06-20 ENCOUNTER — Other Ambulatory Visit: Payer: Self-pay | Admitting: Physician Assistant

## 2017-06-20 DIAGNOSIS — R634 Abnormal weight loss: Secondary | ICD-10-CM

## 2017-06-21 ENCOUNTER — Telehealth: Payer: Self-pay | Admitting: *Deleted

## 2017-06-21 DIAGNOSIS — R634 Abnormal weight loss: Secondary | ICD-10-CM

## 2017-06-21 DIAGNOSIS — K582 Mixed irritable bowel syndrome: Secondary | ICD-10-CM

## 2017-06-21 NOTE — Telephone Encounter (Signed)
Labs reordered for this pt due to provider ordering them under the wrong chart.

## 2017-06-23 LAB — FOOD SPECIFIC IGG ALLERGY( ADULT)
CASEIN IGE: 22.5 ug/mL — AB (ref <2.0)
COFFEE (F221) IGG: 2.5 ug/mL — AB (ref <2.0)
Cacao (Chocolate)IgG: 2.9 ug/mL — ABNORMAL HIGH (ref <2.0)
Codfish/Scrod IgG: <2 ug/mL (ref <2.0)
Corn IgG: 9.8 ug/mL — ABNORMAL HIGH (ref <2.0)
Egg white, IgG: 27.3 ug/mL — ABNORMAL HIGH (ref <2.0)
PEANUT (F13) IGG: 6.6 ug/mL — AB (ref <2.0)
TOMATO IGG: 5.7 ug/mL — AB (ref <2.0)
WHEAT IGG: 27.4 ug/mL — AB (ref <2.0)
YEAST (F45) IGG: 2.5 ug/mL — AB (ref <2.0)

## 2017-07-01 ENCOUNTER — Encounter: Payer: Self-pay | Admitting: Adult Health

## 2017-07-01 ENCOUNTER — Ambulatory Visit (INDEPENDENT_AMBULATORY_CARE_PROVIDER_SITE_OTHER): Payer: Medicaid Other | Admitting: Adult Health

## 2017-07-01 ENCOUNTER — Other Ambulatory Visit: Payer: Self-pay

## 2017-07-01 ENCOUNTER — Other Ambulatory Visit (HOSPITAL_COMMUNITY)
Admission: RE | Admit: 2017-07-01 | Discharge: 2017-07-01 | Disposition: A | Discharge status: Home / Self Care | Payer: No Typology Code available for payment source | Source: Ambulatory Visit | Attending: Adult Health | Admitting: Adult Health

## 2017-07-01 VITALS — BP 92/54 | HR 78 | Ht 66.0 in | Wt 110.0 lb

## 2017-07-01 DIAGNOSIS — Z1211 Encounter for screening for malignant neoplasm of colon: Secondary | ICD-10-CM

## 2017-07-01 DIAGNOSIS — Z01419 Encounter for gynecological examination (general) (routine) without abnormal findings: Secondary | ICD-10-CM | POA: Insufficient documentation

## 2017-07-01 DIAGNOSIS — Z1212 Encounter for screening for malignant neoplasm of rectum: Secondary | ICD-10-CM

## 2017-07-01 DIAGNOSIS — Z9889 Other specified postprocedural states: Secondary | ICD-10-CM

## 2017-07-01 LAB — HEMOCCULT GUIAC POC 1CARD (OFFICE): Fecal Occult Blood, POC: NEGATIVE

## 2017-07-01 NOTE — Addendum Note (Signed)
Addended by: Gaylyn Rong A on: 07/01/2017 10:15 AM   Modules accepted: Orders

## 2017-07-01 NOTE — Progress Notes (Signed)
Patient ID: Kellie [name], female   DOB: 1956-06-03, 62 y.o.   MRN: [id number] History of Present Illness: Kellie [name] is a 62 year old white female in for well woman gyn exam and pap. PCP is Kellie [name], [place].    Current Medications, Allergies, Past Medical History, Past Surgical History, Family History and Social History were reviewed in [employer] record.     Review of Systems: Patient denies any headaches, hearing loss, fatigue, blurred vision, shortness of breath, chest pain, abdominal pain, problems with bowel movements, urination, or intercourse(no sex in 12 years). No joint pain or mood swings. Has swallowing issues, feels like food gets stuck, has lost about 9-10 lbs.    Physical Exam:BP (!) 92/54 (BP Location: Right Arm, Patient Position: Sitting, Cuff Size: Normal)   Pulse 78   Ht 5\' 6"  (1.676 m)   Wt 110 lb (49.9 kg)   BMI 17.75 kg/m  General:  Well developed, well nourished, no acute distress Skin:  Warm and dry Neck:  Midline trachea, normal thyroid, good ROM, no lymphadenopathy, no carotid bruits  Lungs; Clear to auscultation bilaterally Breast:  No dominant palpable mass, retraction, or nipple discharge Cardiovascular: Regular rate and rhythm Abdomen:  Soft, non tender, no hepatosplenomegaly Pelvic:  External genitalia is normal in appearance, no lesions.  The vagina is pale with loss of moisture and rugae. Urethra has no lesions or masses. The cervix is smooth and stenotic at the os, pap with HPV performed.  Uterus is felt to be normal size, shape, and contour.  No adnexal masses or tenderness noted.Bladder is non tender, no masses felt. Rectal: Good sphincter tone, no polyps, or hemorrhoids felt.  Hemoccult negative. Extremities/musculoskeletal:  No swelling or varicosities noted, no clubbing or cyanosis Psych:  No mood changes, alert and cooperative,seems happy PHQ 9 score 12, on meds, does suicidal ideations, has seen therapist in the past, and  plans to resume visits.    Impression: 1. Encounter for gynecological examination with Papanicolaou smear of cervix   2. Screening for colorectal cancer   3. History of loop electrical excision procedure (LEEP)       Plan: Physical in 1 year Pap in 3 if normal Labs with PCP Mammogram yearly Colonoscopy per Gi Call GI for F/U regarding swallowing

## 2017-07-03 LAB — CYTOLOGY - PAP
Diagnosis: NEGATIVE
HPV (WINDOPATH): NOT DETECTED

## 2017-07-08 ENCOUNTER — Ambulatory Visit: Payer: Self-pay | Admitting: "Endocrinology

## 2017-07-11 ENCOUNTER — Ambulatory Visit (INDEPENDENT_AMBULATORY_CARE_PROVIDER_SITE_OTHER): Payer: Self-pay | Admitting: Licensed Clinical Social Worker

## 2017-07-11 ENCOUNTER — Encounter (HOSPITAL_COMMUNITY): Payer: Self-pay | Admitting: Licensed Clinical Social Worker

## 2017-07-11 DIAGNOSIS — F310 Bipolar disorder, current episode hypomanic: Secondary | ICD-10-CM
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
## 2017-07-22 ENCOUNTER — Other Ambulatory Visit: Payer: Self-pay | Admitting: Physician Assistant

## 2017-07-30 ENCOUNTER — Encounter: Payer: Self-pay | Admitting: "Endocrinology

## 2017-07-30 ENCOUNTER — Ambulatory Visit (INDEPENDENT_AMBULATORY_CARE_PROVIDER_SITE_OTHER): Payer: Medicaid Other | Admitting: "Endocrinology

## 2017-07-30 VITALS — BP 106/71 | HR 80 | Ht 66.0 in | Wt 108.0 lb

## 2017-07-30 DIAGNOSIS — R634 Abnormal weight loss: Secondary | ICD-10-CM | POA: Diagnosis not present
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
## 2017-08-01 ENCOUNTER — Ambulatory Visit (HOSPITAL_COMMUNITY): Payer: Self-pay | Admitting: Licensed Clinical Social Worker

## 2017-08-12 ENCOUNTER — Emergency Department (HOSPITAL_COMMUNITY)
Admission: EM | Admit: 2017-08-12 | Discharge: 2017-08-13 | Disposition: A | Discharge status: Home / Self Care | Payer: Medicare Other | Attending: Emergency Medicine | Admitting: Emergency Medicine

## 2017-08-12 ENCOUNTER — Emergency Department (HOSPITAL_COMMUNITY): Payer: Medicare Other

## 2017-08-12 ENCOUNTER — Encounter (HOSPITAL_COMMUNITY): Payer: Self-pay | Admitting: Emergency Medicine

## 2017-08-12 ENCOUNTER — Other Ambulatory Visit: Payer: Self-pay

## 2017-08-12 DIAGNOSIS — R05 Cough: Secondary | ICD-10-CM | POA: Diagnosis present

## 2017-08-12 DIAGNOSIS — B9789 Other viral agents as the cause of diseases classified elsewhere: Secondary | ICD-10-CM

## 2017-08-12 DIAGNOSIS — J069 Acute upper respiratory infection, unspecified: Secondary | ICD-10-CM | POA: Diagnosis not present

## 2017-08-12 DIAGNOSIS — Z79899 Other long term (current) drug therapy: Secondary | ICD-10-CM | POA: Insufficient documentation

## 2017-08-12 LAB — RAPID STREP SCREEN (MED CTR MEBANE ONLY): Streptococcus, Group A Screen (Direct): NEGATIVE

## 2017-08-12 NOTE — ED Triage Notes (Signed)
Pt c/o sore throat, cough, runny nose, and states she is sob with exertion. Pt is speaking in complete sentences with no distress.

## 2017-08-13 ENCOUNTER — Telehealth: Payer: Self-pay | Admitting: *Deleted

## 2017-08-13 NOTE — Telephone Encounter (Signed)
Have her come in ASAP to see Dr. Georgina Snell or Dr. Darene Lamer insurance needs current evaluation to order such an expensive task.

## 2017-08-13 NOTE — ED Provider Notes (Signed)
[facility] EMERGENCY DEPARTMENT Provider Note   CSN: [id number] Arrival date & time: 08/12/17  2206     History   Chief Complaint Chief Complaint  Patient presents with  . Cough    HPI Kellie [name] is a 62 y.o. female.  Patient reports sore throat, cough, nasal congestion with runny nose.  Symptoms been ongoing for about 3 days.  She has noticed some shortness of breath associated with her cough.  She has felt like she has had fever and chills.  Tonight she noticed a white patch on her throat, came in to make sure she did not have strep throat.      Past Medical History:  Diagnosis Date  . Abnormal Pap smear of cervix   . Allergic rhinitis 11/08/2010  . Anxiety   . Asthma   . Bronchitis   . Herpes simplex of female genitalia   . Hyperlipidemia   . Mitral valve prolapse   . Osteoporosis 11/21/2011  . Urticaria     Patient Active Problem List   Diagnosis Date Noted  . History of loop electrical excision procedure (LEEP) 07/01/2017  . Unintentional weight loss 05/18/2017  . Elevated LDL cholesterol level 05/18/2017  . Hyperlipidemia 05/18/2017  . Irritable bowel syndrome with both constipation and diarrhea 05/18/2017  . Family history of pancreatic cancer 02/18/2017  . Diarrhea 02/18/2017  . Weight decrease 02/18/2017  . Weakness 10/05/2016  . Panic attacks 09/18/2016  . SOB (shortness of breath) 09/18/2016  . Bipolar 1 disorder with moderate mania ([place]) 09/17/2016  . Pulmonary nodule 08/08/2016  . Hoarseness 03/09/2016  . Left knee pain 11/09/2015  . Pain in joint, lower leg 11/09/2015  . Plantar fasciitis of left foot 11/09/2015  . Cough, persistent 07/21/2015  . Post-nasal drip 07/21/2015  . Radiculitis of right cervical region 07/06/2015  . DDD (degenerative disc disease), cervical 07/05/2015  . Chronic nasal congestion 03/01/2015  . Vitreous floaters of right eye 03/01/2015  . Near syncope 02/09/2015  . Rhinitis, allergic 10/14/2014  . Family  history of renal cancer 10/11/2014  . DDD (degenerative disc disease), lumbar 10/11/2014  . Bipolar disorder with depression ([place]) 06/02/2014  . Postmenopausal atrophic vaginitis 03/17/2014  . Prediabetes 12/14/2013  . Encounter for gynecological examination with Papanicolaou smear of cervix 12/08/2013  . Adhesive capsulitis of right shoulder 11/24/2013  . Chronic periodontal disease 06/30/2013  . Genital herpes 11/05/2012  . Hemorrhoids 11/05/2012  . Borderline intellectual functioning 02/15/2012  . Osteoporosis 11/21/2011  . Allergic rhinitis 11/08/2010  . Insomnia 11/08/2010  . GERD (gastroesophageal reflux disease) 09/27/2010    Past Surgical History:  Procedure Laterality Date  . CLOSED MANIPULATION SHOULDER  7&01/2005   x2, 30 days apart  . CLOSED MANIPULATION SHOULDER  2006   Left  . COLONOSCOPY  09/2012   Medium sized external hemorrhoids, few small divertic in left colon, otherwise normal colon (repeat 10 yrs)  . ESOPHAGOGASTRODUODENOSCOPY  09/2012   Normal (Dr. Ardis Hughs)  . LEEP  11/2004    OB History    Gravida Para Term Preterm AB Living   1 1 1     1    SAB TAB Ectopic Multiple Live Births                   Home Medications    Prior to Admission medications   Medication Sig Start Date End Date Taking? Authorizing Provider  acyclovir (ZOVIRAX) 200 MG capsule Take 2 capsules (400 mg total) by mouth 2 (two) times  daily. 08/08/16   Donella Stade, PA-C  alendronate (FOSAMAX) 70 MG tablet Take 1 tablet (70 mg total) by mouth every 7 (seven) days. Take with a full glass of water on an empty stomach. 12/25/16   Hali Marry, MD  ALPRAZolam Duanne Moron) 0.5 MG tablet TAKE 1 TABLET ONCE DAILY AS NEEDED FOR ANXIETY. 07/22/17   Breeback, Jade L, PA-C  AMBULATORY NON FORMULARY MEDICATION Glucometer with lancets and test strips to test as needed for hypoglycemia.  #20 strip 10/03/16   Breeback, Jade L, PA-C  AMBULATORY NON FORMULARY MEDICATION One touch  Delica for checking  glucose up to three times a week for hypoglycemia. 05/14/17   Breeback, Jade L, PA-C  cetirizine (ZYRTEC) 10 MG tablet Take 10 mg by mouth daily.    [provider]  Cholecalciferol (VITAMIN D) 2000 units CAPS Take by mouth.    [provider]  citalopram (CELEXA) 10 MG tablet TAKE (1) TABLET BY MOUTH ONCE DAILY. 07/22/17   Breeback, Jade L, PA-C  famotidine (PEPCID) 10 MG tablet Take 10 mg by mouth 2 (two) times daily.    [provider]  gabapentin (NEURONTIN) 100 MG capsule Take 1 capsule (100 mg total) by mouth 3 (three) times daily. 05/28/17   Merian Capron, MD  montelukast (SINGULAIR) 10 MG tablet Take 1 tablet (10 mg total) by mouth at bedtime. 09/17/16   Breeback, Royetta Car, PA-C  mupirocin ointment (BACTROBAN) 2 % Place 1 application into the nose 2 (two) times daily. 07/31/16   Breeback, Luvenia Starch L, PA-C  Probiotic Product (PROBIOTIC & ACIDOPHILUS EX ST PO) Take by mouth.    [provider]    Family History Family History  Problem Relation Age of Onset  . Kidney cancer Mother 46  . COPD Mother   . Cancer Mother        renal  . Kidney disease Mother   . Allergic rhinitis Mother   . Asthma Mother   . Heart disease Father   . Allergic rhinitis Father   . COPD Sister   . Depression Sister   . Pancreatic cancer Sister   . Cancer Sister        pancreatic  . Heart disease Brother   . Heart attack Brother   . Heart disease Paternal Uncle   . Diabetes Unknown        neice  . Thyroid disease Unknown        neice  . Depression Maternal Aunt   . Depression Maternal Grandfather   . Hypothyroidism Sister   . Hypothyroidism Sister   . Colon cancer Neg Hx   . Rectal cancer Neg Hx   . Stomach cancer Neg Hx   . Colon polyps Neg Hx     Social History Social History   Tobacco Use  . Smoking status: Never Smoker  . Smokeless tobacco: Never Used  Substance Use Topics  . Alcohol use: No  . Drug use: No     Allergies   Hydrocodone; Abilify  [aripiprazole]; Acyclovir and related; Codeine; Morphine and related; Percocet [oxycodone-acetaminophen]; and Vicodin [hydrocodone-acetaminophen]   Review of Systems Review of Systems  Constitutional: Positive for chills.  HENT: Positive for congestion and sore throat.   Respiratory: Positive for cough.   All other systems reviewed and are negative.    Physical Exam Updated Vital Signs BP 120/78 (BP Location: Right Arm)   Pulse 74   Temp 97.9 F (36.6 C)   Resp 16   Ht 5'  6" (1.676 m)   Wt 49 kg (108 lb)   SpO2 98%   BMI 17.43 kg/m   Physical Exam  Constitutional: She is oriented to person, place, and time. She appears well-developed and well-nourished. No distress.  HENT:  Head: Normocephalic and atraumatic.  Right Ear: Hearing normal.  Left Ear: Hearing normal.  Nose: Nose normal.  Mouth/Throat: Mucous membranes are normal. Posterior oropharyngeal erythema present. No oropharyngeal exudate or tonsillar abscesses.  Eyes: Conjunctivae and EOM are normal. Pupils are equal, round, and reactive to light.  Neck: Normal range of motion. Neck supple.  Cardiovascular: Regular rhythm, S1 normal and S2 normal. Exam reveals no gallop and no friction rub.  No murmur heard. Pulmonary/Chest: Effort normal and breath sounds normal. No respiratory distress. She exhibits no tenderness.  Abdominal: Soft. Normal appearance and bowel sounds are normal. There is no hepatosplenomegaly. There is no tenderness. There is no rebound, no guarding, no tenderness at McBurney's point and negative Murphy's sign. No hernia.  Musculoskeletal: Normal range of motion.  Neurological: She is alert and oriented to person, place, and time. She has normal strength. No cranial nerve deficit or sensory deficit. Coordination normal. GCS eye subscore is 4. GCS verbal subscore is 5. GCS motor subscore is 6.  Skin: Skin is warm, dry and intact. No rash noted. No cyanosis.  Psychiatric: She has a normal mood and affect.  Her speech is normal and behavior is normal. Thought content normal.  Nursing note and vitals reviewed.    ED Treatments / Results  Labs (all labs ordered are listed, but only abnormal results are displayed) Labs Reviewed  RAPID STREP SCREEN (NOT AT [facility])  CULTURE, GROUP A STREP [facility])    EKG  EKG Interpretation None       Radiology Dg Chest 2 View  Result Date: 08/12/2017 CLINICAL DATA:  Cough and shortness of breath EXAM: CHEST  2 VIEW COMPARISON:  Chest CT 12/17/2016 FINDINGS: The heart size and mediastinal contours are within normal limits. Both lungs are clear. The visualized skeletal structures are unremarkable. IMPRESSION: No active cardiopulmonary disease. Electronically Signed   By: Ulyses Jarred M.D.   On: 08/12/2017 22:46    Procedures Procedures (including critical care time)  Medications Ordered in ED Medications - No data to display   Initial Impression / Assessment and Plan / ED Course  I have reviewed the triage vital signs and the nursing notes.  Pertinent labs & imaging results that were available during my care of the patient were reviewed by me and considered in my medical decision making (see chart for details).     She appears well, vital signs normal, no fever.  Chest x-ray is clear.  Lungs are clear on auscultation.  No hypoxia or wheezing.  Rapid strep negative, culture pending.  Patient reassured, treat symptomatically as a viral upper respiratory process.  Final Clinical Impressions(s) / ED Diagnoses   Final diagnoses:  Viral URI with cough    ED Discharge Orders    None       Pollina, Gwenyth Allegra, MD 08/13/17 [phone]

## 2017-08-13 NOTE — Telephone Encounter (Signed)
Pt left vm wanting you to order MRIs of her cervical & lumbar spine.  She stated that she is in "terrible pain & unable to sleep".

## 2017-08-13 NOTE — ED Notes (Signed)
Pt alert & oriented x4, stable gait. Patient  given discharge instructions, paperwork & prescription(s). Patient verbalized understanding. Pt left department w/ no further questions. 

## 2017-08-14 ENCOUNTER — Ambulatory Visit: Payer: Self-pay | Admitting: "Endocrinology

## 2017-08-14 LAB — VITAMIN B12: VITAMIN B 12: 628 pg/mL (ref 200–1100)

## 2017-08-14 LAB — CELIAC DISEASE COMPREHENSIVE PANEL WITH REFLEXES
(tTG) Ab, IgA: 1 U/mL
Immunoglobulin A: 106 mg/dL (ref 81–463)

## 2017-08-14 LAB — VITAMIN D 25 HYDROXY (VIT D DEFICIENCY, FRACTURES): Vit D, 25-Hydroxy: 34 ng/mL (ref 30–100)

## 2017-08-14 LAB — CORTISOL-AM, BLOOD: Cortisol - AM: 13.4 ug/dL

## 2017-08-14 NOTE — Telephone Encounter (Signed)
Called patient and got her scheduled with Dr. Darene Lamer on Feb. 27th . Thanks

## 2017-08-15 ENCOUNTER — Encounter: Payer: Self-pay | Admitting: "Endocrinology

## 2017-08-15 ENCOUNTER — Ambulatory Visit: Payer: Self-pay | Admitting: "Endocrinology

## 2017-08-15 ENCOUNTER — Ambulatory Visit (INDEPENDENT_AMBULATORY_CARE_PROVIDER_SITE_OTHER): Payer: Medicaid Other | Admitting: "Endocrinology

## 2017-08-15 VITALS — BP 115/75 | HR 91 | Ht 66.0 in | Wt 110.0 lb

## 2017-08-15 DIAGNOSIS — R634 Abnormal weight loss: Secondary | ICD-10-CM | POA: Diagnosis not present

## 2017-08-15 LAB — CULTURE, GROUP A STREP (THRC)
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
## 2017-08-20 ENCOUNTER — Ambulatory Visit (INDEPENDENT_AMBULATORY_CARE_PROVIDER_SITE_OTHER): Payer: No Typology Code available for payment source | Admitting: Sports Medicine

## 2017-08-20 DIAGNOSIS — M5030 Other cervical disc degeneration, unspecified cervical region: Secondary | ICD-10-CM

## 2017-08-20 DIAGNOSIS — M5136 Other intervertebral disc degeneration, lumbar region: Secondary | ICD-10-CM

## 2017-08-20 DIAGNOSIS — R634 Abnormal weight loss: Secondary | ICD-10-CM

## 2017-08-20 DIAGNOSIS — M4807 Spinal stenosis, lumbosacral region: Secondary | ICD-10-CM

## 2017-08-20 DIAGNOSIS — M51369 Other intervertebral disc degeneration, lumbar region without mention of lumbar back pain or lower extremity pain: Secondary | ICD-10-CM

## 2017-08-20 MED ORDER — MIRTAZAPINE 15 MG PO TABS: 15.0000 mg | ORAL_TABLET | Freq: Every day | ORAL | 3 refills | Status: DC

## 2017-08-20 MED ORDER — MELOXICAM 15 MG PO TABS
ORAL_TABLET | ORAL | 3 refills | Status: DC
Start: 2017-08-20 — End: 2018-03-10

## 2017-08-20 MED ORDER — PREDNISONE 50 MG PO TABS
ORAL_TABLET | ORAL | 0 refills | Status: DC
Start: 2017-08-20 — End: 2017-10-22

## 2017-08-20 NOTE — Assessment & Plan Note (Signed)
Persistent pain for greater than 6 weeks in spite of conservative measures, adding some more physical therapy, prednisone, MRI. She has already had an x-ray.

## 2017-08-20 NOTE — Assessment & Plan Note (Signed)
Prednisone, MRI. Formal physical therapy.

## 2017-08-20 NOTE — Assessment & Plan Note (Signed)
Only about 10 pounds over the past several years, up-to-date on age-appropriate cancer screening. Labs are unremarkable. Has discussed this with gastroenterology, they recommended nutritionist. She is a bit underweight with her body mass index. We could add some low-dose mirtazapine to help her gain some weight, I also recommended increased resistance training in the gym after physical therapy. Certainly we will be coping a close eye out for precipitation of mania.
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
## 2017-08-21 ENCOUNTER — Encounter: Payer: Self-pay | Admitting: Sports Medicine

## 2017-08-26 ENCOUNTER — Ambulatory Visit (INDEPENDENT_AMBULATORY_CARE_PROVIDER_SITE_OTHER): Payer: No Typology Code available for payment source

## 2017-08-26 DIAGNOSIS — M47892 Other spondylosis, cervical region: Secondary | ICD-10-CM

## 2017-08-26 DIAGNOSIS — M4807 Spinal stenosis, lumbosacral region: Secondary | ICD-10-CM

## 2017-08-26 DIAGNOSIS — M5030 Other cervical disc degeneration, unspecified cervical region: Secondary | ICD-10-CM

## 2017-08-26 DIAGNOSIS — M5116 Intervertebral disc disorders with radiculopathy, lumbar region: Secondary | ICD-10-CM

## 2017-08-26 DIAGNOSIS — R200 Anesthesia of skin: Secondary | ICD-10-CM

## 2017-08-26 DIAGNOSIS — M5136 Other intervertebral disc degeneration, lumbar region: Secondary | ICD-10-CM

## 2017-08-26 DIAGNOSIS — M1288 Other specific arthropathies, not elsewhere classified, other specified site: Secondary | ICD-10-CM

## 2017-09-03 ENCOUNTER — Encounter: Payer: Self-pay | Admitting: Physical Therapy

## 2017-09-03 ENCOUNTER — Ambulatory Visit (INDEPENDENT_AMBULATORY_CARE_PROVIDER_SITE_OTHER): Payer: No Typology Code available for payment source | Admitting: Physical Therapy

## 2017-09-03 DIAGNOSIS — M79601 Pain in right arm: Secondary | ICD-10-CM

## 2017-09-03 DIAGNOSIS — M545 Low back pain, unspecified: Secondary | ICD-10-CM

## 2017-09-03 DIAGNOSIS — G8929 Other chronic pain: Secondary | ICD-10-CM

## 2017-09-03 DIAGNOSIS — R531 Weakness: Secondary | ICD-10-CM

## 2017-09-03 DIAGNOSIS — M79602 Pain in left arm: Secondary | ICD-10-CM

## 2017-09-03 NOTE — Therapy (Signed)
[place], [place], [address] Phone: [phone]   Fax:  [phone]  Physical Therapy Evaluation  Patient Details  Name: Kellie [name] MRN: [id number] Date of Birth: June 21, 1956 Referring Provider: Dr Dianah Field   Encounter Date: 09/03/2017  PT End of Session - 09/03/17 1111    Visit Number  1    Number of Visits  12    Date for PT Re-Evaluation  10/15/17    PT Start Time  1112    PT Stop Time  1153    PT Time Calculation (min)  41 min    Activity Tolerance  Patient limited by fatigue       Past Medical History:  Diagnosis Date  . Abnormal Pap smear of cervix   . Allergic rhinitis 11/08/2010  . Anxiety   . Asthma   . Bronchitis   . Herpes simplex of female genitalia   . Hyperlipidemia   . Mitral valve prolapse   . Osteoporosis 11/21/2011  . Urticaria     Past Surgical History:  Procedure Laterality Date  . CLOSED MANIPULATION SHOULDER  7&01/2005   x2, 30 days apart  . CLOSED MANIPULATION SHOULDER  2006   Left  . COLONOSCOPY  09/2012   Medium sized external hemorrhoids, few small divertic in left colon, otherwise normal colon (repeat 10 yrs)  . ESOPHAGOGASTRODUODENOSCOPY  09/2012   Normal (Dr. Ardis Hughs)  . LEEP  11/2004    There were no vitals filed for this visit.   Subjective Assessment - 09/03/17 1112    Subjective  Pt reports she is not sure what happended however her low back started hurting more before the holidays last year and the neck is intermittent.  She is doing some exercises at home and just joined the gym again.      How long can you walk comfortably?  house hold ambulation     Patient Stated Goals  get where she can do her grocery shopping without pain     Currently in Pain?  Yes    Pain Score  8     Pain Location  Back    Pain Orientation  Mid;Lower    Pain Descriptors / Indicators  Constant;Sharp    Pain Type  Chronic pain    Pain Onset  More than a month ago     Pain Frequency  Constant    Aggravating Factors   lying down any position and sitting on sofa    Pain Relieving Factors  pillow between the legs for sleep    Multiple Pain Sites  Yes neck is ok today, no ryhme or reason for that.          [facility] PT Assessment - 09/03/17 0001      Assessment   Medical Diagnosis  lumbar and cervical DDD    Referring Provider  Dr Dianah Field    Onset Date/Surgical Date  03/06/17    Hand Dominance  Right    Next MD Visit  PRN    Prior Therapy  yes      Precautions   Precautions  Other (comment)    Precaution Comments  osteoporosis      Balance Screen   Has the patient fallen in the past 6 months  No      Prior Function   Level of Independence  Independent    Vocation  On disability      Observation/Other Assessments   Focus on Therapeutic Outcomes (FOTO)  58% limited      Posture/Postural Control   Posture/Postural Control  Postural limitations    Postural Limitations  Decreased lumbar lordosis;Decreased thoracic kyphosis very small framed      ROM / Strength   AROM / PROM / Strength  AROM;Strength      AROM   AROM Assessment Site  Cervical;Shoulder    Right/Left Shoulder  -- WNL except Lt flexion 155    Cervical Flexion  35    Cervical Extension  WNL    Cervical - Right Rotation  50    Cervical - Left Rotation  68      Strength   Overall Strength Comments  upper back 4/5    Strength Assessment Site  Shoulder;Elbow;Hip;Knee;Lumbar    Right/Left Shoulder  -- grossly 4/5     Right/Left Elbow  -- grossly 4+/5    Right/Left Hip  Right;Left    Right Hip Flexion  4+/5    Right Hip Extension  4/5    Right Hip ABduction  4/5    Left Hip Flexion  4+/5    Left Hip Extension  4/5    Left Hip ABduction  4+/5    Right/Left Knee  -- Lt WNL, Rt grossly 5-/5    Lumbar Extension  -- multifidi Lt good, Rt fair      Flexibility   Soft Tissue Assessment /Muscle Length  -- WNL      Palpation   SI assessment   NA    Palpation comment  NA              Objective measurements completed on examination: See above findings.      [facility] Adult PT Treatment/Exercise - 09/03/17 0001      Self-Care   Self-Care  Other Self-Care Comments    Other Self-Care Comments   discussed with patient the importance of her maintaining an exercise program or her problems will keep happening.       Exercises   Exercises  -- performed HEP per handout                  PT Long Term Goals - 09/03/17 1141      PT LONG TERM GOAL #1   Title  I with HEP ( 10/15/17)     Time  6    Period  Weeks    Status  New      PT LONG TERM GOAL #2   Title  report =/> 50% reduction in overall body pain to allow her to push through exercise ( 10/15/17)    Time  6    Period  Weeks    Status  New      PT LONG TERM GOAL #3   Title  improve U/LE strength -/> 5-/5 with minimal pain ( 10/15/17)     Time  6    Period  Weeks    Status  New      PT LONG TERM GOAL #4   Title  improve cervical rotation =/> 70 degrees bilat. ( 10/15/17)     Time  6    Period  Weeks    Status  New      PT LONG TERM GOAL #5   Title  improve FOTO =/< 47% limited (10/15/17)     Time  6    Period  Weeks    Status  New             Plan - 09/03/17 1330  Clinical Impression Statement  62 yo female well known to this therapist from previous treatments.  She has loss weight since last seen and is having back and neck pain.  She had stopped all her exercise and just recently rejoined the gym.  She has general body weakness and fatigues quickly with all exercise.  Pt also reports pain/discomfort with exercise and it was explained that this will happen when an exericse program is restarted.     Clinical Presentation  Evolving    Clinical Decision Making  Low    Rehab Potential  Good    PT Frequency  2x / week    PT Duration  6 weeks    PT Treatment/Interventions  Iontophoresis 4mg /ml Dexamethasone;Manual techniques;Moist Heat;Patient/family  education;Taping;Therapeutic exercise;Cryotherapy;Electrical Stimulation    PT Next Visit Plan  progress overall body strengthening.     Consulted and Agree with Plan of Care  Patient       Patient will benefit from skilled therapeutic intervention in order to improve the following deficits and impairments:  Pain, Decreased strength, Impaired UE functional use  Visit Diagnosis: Weakness generalized - Plan: PT plan of care cert/re-cert  Pain in both upper extremities - Plan: PT plan of care cert/re-cert  Chronic bilateral low back pain without sciatica - Plan: PT plan of care cert/re-cert     Problem List Patient Active Problem List   Diagnosis Date Noted  . History of loop electrical excision procedure (LEEP) 07/01/2017  . Unintentional weight loss 05/18/2017  . Elevated LDL cholesterol level 05/18/2017  . Hyperlipidemia 05/18/2017  . Irritable bowel syndrome with both constipation and diarrhea 05/18/2017  . Family history of pancreatic cancer 02/18/2017  . Diarrhea 02/18/2017  . Loss of weight 02/18/2017  . Weakness 10/05/2016  . Panic attacks 09/18/2016  . SOB (shortness of breath) 09/18/2016  . Bipolar 1 disorder with moderate mania ([place]) 09/17/2016  . Pulmonary nodule 08/08/2016  . Hoarseness 03/09/2016  . Left knee pain 11/09/2015  . Pain in joint, lower leg 11/09/2015  . Plantar fasciitis of left foot 11/09/2015  . Cough, persistent 07/21/2015  . Post-nasal drip 07/21/2015  . DDD (degenerative disc disease), cervical 07/05/2015  . Chronic nasal congestion 03/01/2015  . Vitreous floaters of right eye 03/01/2015  . Near syncope 02/09/2015  . Rhinitis, allergic 10/14/2014  . Family history of renal cancer 10/11/2014  . DDD (degenerative disc disease), lumbar 10/11/2014  . Bipolar disorder with depression ([place]) 06/02/2014  . Postmenopausal atrophic vaginitis 03/17/2014  . Prediabetes 12/14/2013  . Encounter for gynecological examination with Papanicolaou smear of  cervix 12/08/2013  . Adhesive capsulitis of right shoulder 11/24/2013  . Chronic periodontal disease 06/30/2013  . Genital herpes 11/05/2012  . Hemorrhoids 11/05/2012  . Borderline intellectual functioning 02/15/2012  . Osteoporosis 11/21/2011  . Allergic rhinitis 11/08/2010  . Insomnia 11/08/2010  . GERD (gastroesophageal reflux disease) 09/27/2010    Jeral Pinch PT  09/03/2017, 1:37 PM  [facility] [place], [place], [address] Phone: [phone]   Fax:  [phone]  Name: Kellie [name] MRN: [id number] Date of Birth: 1956/05/13

## 2017-09-03 NOTE — Patient Instructions (Addendum)
Over Head Pull: Narrow Grip     [place] [phone]   On back, knees bent, feet flat, band across thighs, elbows straight but relaxed. Pull hands apart (start). Keeping elbows straight, bring arms up and over head, hands toward floor. Keep pull steady on band. Hold momentarily. Return slowly, keeping pull steady, back to start. Repeat _10-20__ times. Band color __red___. Three times a week.   Side Pull: Double Arm   On back, knees bent, feet flat. Arms perpendicular to body, shoulder level, elbows straight but relaxed. Pull arms out to sides, elbows straight. Resistance band comes across collarbones, hands toward floor. Hold momentarily. Slowly return to starting position. Repeat 10-20__ times. Band color __red__. Three times a week.    Shoulder Rotation: Double Arm   On back, knees bent, feet flat, elbows tucked at sides, bent 90, hands palms up. Pull hands apart and down toward floor, keeping elbows near sides. Hold momentarily. Slowly return to starting position. Repeat __10-20_ times. Band color __red__.  Three times a week.   Bridge Pose, One Leg    Bring knee to chest. Roll up from tailbone to bridge pose on supporting leg. Focus on engaging posterior hip muscles. Repeat _10-20___ times each leg. Three times a week.   Single Leg Stretch - if neck gets sore, rest it down and continue with legs.     Lie on back, opposite hand holding knee to chest, other hand on same shin, other leg at 45. Exhale, curling up head and upper torso. Holding curl, inhale and change leg and hand positions. Exhale, changing back. Repeat _10-20___ changes with single breaths. Three times a day.  NOTE: Keep navel to spine, back flat.

## 2017-09-09 ENCOUNTER — Encounter: Payer: Medicare Other | Attending: "Endocrinology | Admitting: Nutrition

## 2017-09-09 ENCOUNTER — Ambulatory Visit (HOSPITAL_COMMUNITY): Payer: Self-pay | Admitting: Licensed Clinical Social Worker

## 2017-09-09 VITALS — Ht 66.0 in | Wt 109.0 lb

## 2017-09-09 DIAGNOSIS — R634 Abnormal weight loss: Secondary | ICD-10-CM

## 2017-09-09 NOTE — Patient Instructions (Addendum)
Goals 1. Follow My Plate eat 3 meals and 3 snacks per day. 2. Avoid lactose products- use Lactaid milk 3. Cut out gluten  Foods and try to get GLuten Free foods.. 4. Take  women one a day vitamin. 5. Try to do yoga, dancing or meditation to help with relaxation Gain 1-2 lbs per month

## 2017-09-09 NOTE — Progress Notes (Signed)
  Medical Nutrition Therapy:  Appt start time: 0800 end time:  0900.   Assessment:  Primary concerns today: Loss of weight. LIves by herself.  Sees lives in [place] recently moved. PMH: Bipolar.  Her triggers are ignorance, stupidity of other people.[name] Kitchen Has been on Gapapentin for a year. Feels she has been hyper most of the time and then has a down day once a week of spurts  Crying. Usually does something constructive to get her mind off of stuff. She desires to weigh about 118 lbs.   Bipolar Dr.Arkiter in [place]. Sees Dr. Dorris Fetch for Endo. Primary Driscilla Grammes. GOing to see someone at GI for her stomach issues.  Eats three full meals and snacks throughout the day.  She says she is checking for parasites because she is paranoid she has them in her nose and in her rectum.Marlana Salvage notes her MD has told her all her labs came back fine and she doesn't have any intestinal issues or parasites in her stool. Denies going out of the county or drinking contaminated water or spoiled foods. IBW for her ht is 120-130's. She is a small frame. She is willing to increase portions, add calories and eat more often to gain weight.  Wt Readings from Last 3 Encounters:  08/20/17 108 lb (49 kg)  08/15/17 110 lb (49.9 kg)  08/12/17 108 lb (49 kg)   Ht Readings from Last 3 Encounters:  08/15/17 _0  (1.676 m)  08/12/17 _1  (1.676 m)  07/30/17 _2  (1.676 m)   There is no height or weight on file to calculate BMI.   Preferred Learning Style:  Visual    No preference indicated    Ready  Change in progress   MEDICATIONS:   DIETARY INTAKE:     24-hr recall:  B ( AM): Sausage, eggs and hamburger bun or banana whole, 2-3 oz-powderd creamer Snk ( AM):  PB on bread or  L ( PM):  2 piece chicken tenders, fries, unsweet tea Snk ( PM):  D ( PM):  [place] steak, mashed potatoes, mac/cheese and banana, 2-3 oz coke, 1 oz ginger ale, water, Snk ( PM):   Beverages: 3 -16 oz of bottles  water  Usual physical activity:  [employer] once a week.  Estimated energy needs: 1800-2000  calories 200  g carbohydrates 135 g protein 50 g fat  Progress Towards Goal(s):  In progress.   Nutritional Diagnosis:  NI-1.4 Inadequate energy intake As related to Being underweight.  As evidenced by BMI 17    Intervention: HIgh Calorie High Protein Diet. Goals 1. Follow My Plate eat 3 meals and 3 snacks per day. 2. Avoid lactose products- use Lactaid milk 3. Cut out gluten  Foods and try to get GLuten Free foods.. 4. Take  women one a day vitamin. 5. Try to do yoga, dancing or meditation to help with relaxation Gain 1-2 lbs per month  Teaching Method Utilized:  Visual Auditory Hands on  Handouts given during visit include:  HIgh Calorie High Protein Diet   Barriers to learning/adherence to lifestyle change: Bipolar  Demonstrated degree of understanding via:  Teach Back   Monitoring/Evaluation:  Dietary intake, exercise, meal planning, and body weight in 1 month(s).

## 2017-09-10 ENCOUNTER — Encounter: Payer: Self-pay | Admitting: Physical Therapy

## 2017-09-10 ENCOUNTER — Ambulatory Visit (INDEPENDENT_AMBULATORY_CARE_PROVIDER_SITE_OTHER): Payer: No Typology Code available for payment source | Admitting: Physical Therapy

## 2017-09-10 DIAGNOSIS — M79602 Pain in left arm: Secondary | ICD-10-CM

## 2017-09-10 DIAGNOSIS — R531 Weakness: Secondary | ICD-10-CM

## 2017-09-10 DIAGNOSIS — M79601 Pain in right arm: Secondary | ICD-10-CM

## 2017-09-10 DIAGNOSIS — M62838 Other muscle spasm: Secondary | ICD-10-CM

## 2017-09-10 DIAGNOSIS — M545 Low back pain, unspecified: Secondary | ICD-10-CM

## 2017-09-10 DIAGNOSIS — M542 Cervicalgia: Secondary | ICD-10-CM

## 2017-09-10 DIAGNOSIS — G8929 Other chronic pain: Secondary | ICD-10-CM

## 2017-09-10 NOTE — Therapy (Signed)
[place], [place], [address] Phone: [phone]   Fax:  [phone]  Physical Therapy Treatment  Patient Details  Name: Kellie [name] MRN: [id number] Date of Birth: [birth year] Referring Provider: Dr Dianah Field   Encounter Date: 09/10/2017  PT End of Session - 09/10/17 0849    Visit Number  2    Number of Visits  12    Date for PT Re-Evaluation  10/15/17    PT Start Time  0849    PT Stop Time  0929    PT Time Calculation (min)  40 min    Activity Tolerance  Patient tolerated treatment well       Past Medical History:  Diagnosis Date  . Abnormal Pap smear of cervix   . Allergic rhinitis 11/08/2010  . Anxiety   . Asthma   . Bronchitis   . Herpes simplex of female genitalia   . Hyperlipidemia   . Mitral valve prolapse   . Osteoporosis 11/21/2011  . Urticaria     Past Surgical History:  Procedure Laterality Date  . CLOSED MANIPULATION SHOULDER  7&01/2005   x2, 30 days apart  . CLOSED MANIPULATION SHOULDER  2006   Left  . COLONOSCOPY  09/2012   Medium sized external hemorrhoids, few small divertic in left colon, otherwise normal colon (repeat 10 yrs)  . ESOPHAGOGASTRODUODENOSCOPY  09/2012   Normal (Dr. Ardis Hughs)  . LEEP  11/2004    There were no vitals filed for this visit.  Subjective Assessment - 09/10/17 0851    Subjective  She's been gong to [employer] and using some of the equipment, mentally she is feeling better however her muscles are sore.    Currently in Pain?  Yes    Pain Score  8     Pain Location  Back    Pain Orientation  Mid;Lower    Pain Descriptors / Indicators  Aching    Pain Type  Chronic pain    Pain Onset  More than a month ago    Pain Frequency  Constant    Aggravating Factors   being still    Pain Relieving Factors  gentle exercise         [facility] PT Assessment - 09/10/17 0001      Assessment   Medical Diagnosis  lumbar and cervical DDD      AROM    Cervical - Right Rotation  58    Cervical - Left Rotation  59                  [facility] Adult PT Treatment/Exercise - 09/10/17 0001      Exercises   Exercises  Lumbar      Lumbar Exercises: Stretches   Double Knee to Chest Stretch  30 seconds    Lower Trunk Rotation  2 reps;20 seconds    Other Lumbar Stretch Exercise  whole body stretch    Other Lumbar Stretch Exercise  childs pose straight and to each side      Lumbar Exercises: Aerobic   Nustep  L5x5' arms and legs      Lumbar Exercises: Standing   Row  Strengthening;20 reps;Theraband in a squat with slight FWD lean    Theraband Level (Row)  Level 2 (Red)      Lumbar Exercises: Supine   Single Leg Bridge  20 reps each side, good form    Large Ball Abdominal Isometric  20 reps    Other  Supine Lumbar Exercises  30 rep head presses into ball off EOB      Lumbar Exercises: Prone   Other Prone Lumbar Exercises  2x10 upper body lifts - very challenging for patient                  PT Long Term Goals - 09/10/17 7322      PT LONG TERM GOAL #1   Title  I with HEP ( 10/15/17)     Status  On-going      PT LONG TERM GOAL #2   Title  report =/> 50% reduction in overall body pain to allow her to push through exercise ( 10/15/17)    Status  On-going more sore now due to adding in more exercise      PT LONG TERM GOAL #3   Title  improve U/LE strength -/> 5-/5 with minimal pain ( 10/15/17)     Status  On-going      PT LONG TERM GOAL #4   Title  improve cervical rotation =/> 70 degrees bilat. ( 10/15/17)     Status  On-going      PT LONG TERM GOAL #5   Title  improve FOTO =/< 47% limited (10/15/17)     Status  On-going            Plan - 09/10/17 0904    Clinical Impression Statement  This is Sharons second visit with [place], she has returned to the gym and using the equipment, goal is 3x/wk. Her back is much weaker than her abdominals, fatigues quicker. No goals met at this time.  She continue with generallized  weakness.     Rehab Potential  Good    PT Treatment/Interventions  Iontophoresis 12m/ml Dexamethasone;Manual techniques;Moist Heat;Patient/family education;Taping;Therapeutic exercise;Cryotherapy;Electrical Stimulation    PT Next Visit Plan  progress overall body strengthening.     Consulted and Agree with Plan of Care  Patient       Patient will benefit from skilled therapeutic intervention in order to improve the following deficits and impairments:  Pain, Decreased strength, Impaired UE functional use  Visit Diagnosis: Pain in both upper extremities  Weakness generalized  Chronic bilateral low back pain without sciatica  Neck pain  Other muscle spasm     Problem List Patient Active Problem List   Diagnosis Date Noted  . History of loop electrical excision procedure (LEEP) 07/01/2017  . Unintentional weight loss 05/18/2017  . Elevated LDL cholesterol level 05/18/2017  . Hyperlipidemia 05/18/2017  . Irritable bowel syndrome with both constipation and diarrhea 05/18/2017  . Family history of pancreatic cancer 02/18/2017  . Diarrhea 02/18/2017  . Loss of weight 02/18/2017  . Weakness 10/05/2016  . Panic attacks 09/18/2016  . SOB (shortness of breath) 09/18/2016  . Bipolar 1 disorder with moderate mania ([place] 09/17/2016  . Pulmonary nodule 08/08/2016  . Hoarseness 03/09/2016  . Left knee pain 11/09/2015  . Pain in joint, lower leg 11/09/2015  . Plantar fasciitis of left foot 11/09/2015  . Cough, persistent 07/21/2015  . Post-nasal drip 07/21/2015  . DDD (degenerative disc disease), cervical 07/05/2015  . Chronic nasal congestion 03/01/2015  . Vitreous floaters of right eye 03/01/2015  . Near syncope 02/09/2015  . Rhinitis, allergic 10/14/2014  . Family history of renal cancer 10/11/2014  . DDD (degenerative disc disease), lumbar 10/11/2014  . Bipolar disorder with depression ([place] 06/02/2014  . Postmenopausal atrophic vaginitis 03/17/2014  . Prediabetes 12/14/2013   . Encounter for gynecological examination with  Papanicolaou smear of cervix 12/08/2013  . Adhesive capsulitis of right shoulder 11/24/2013  . Chronic periodontal disease 06/30/2013  . Genital herpes 11/05/2012  . Hemorrhoids 11/05/2012  . Borderline intellectual functioning 02/15/2012  . Osteoporosis 11/21/2011  . Allergic rhinitis 11/08/2010  . Insomnia 11/08/2010  . GERD (gastroesophageal reflux disease) 09/27/2010    Jeral Pinch PT  09/10/2017, 9:29 AM  [facility] [place], [place], [address] Phone: [phone]   Fax:  [phone]  Name: BILLIEJEAN SCHIMEK MRN: [id number] Date of Birth: 1955/10/26

## 2017-09-12 ENCOUNTER — Ambulatory Visit (INDEPENDENT_AMBULATORY_CARE_PROVIDER_SITE_OTHER): Payer: No Typology Code available for payment source | Admitting: Physical Therapy

## 2017-09-12 DIAGNOSIS — M542 Cervicalgia: Secondary | ICD-10-CM

## 2017-09-12 DIAGNOSIS — M79602 Pain in left arm: Secondary | ICD-10-CM

## 2017-09-12 DIAGNOSIS — G8929 Other chronic pain: Secondary | ICD-10-CM

## 2017-09-12 DIAGNOSIS — M79601 Pain in right arm: Secondary | ICD-10-CM

## 2017-09-12 DIAGNOSIS — M545 Low back pain: Secondary | ICD-10-CM

## 2017-09-12 DIAGNOSIS — R531 Weakness: Secondary | ICD-10-CM

## 2017-09-12 NOTE — Therapy (Signed)
[place], [place], [address] Phone: [phone]   Fax:  [phone]  Physical Therapy Treatment  Patient Details  Name: Kellie [name] MRN: [id number] Date of Birth: 29-Mar-1956 Referring Provider: Dr Dianah Field   Encounter Date: 09/12/2017  PT End of Session - 09/12/17 1204    Visit Number  3    Number of Visits  12    Date for PT Re-Evaluation  10/15/17    PT Start Time  0867    PT Stop Time  1232    PT Time Calculation (min)  47 min    Activity Tolerance  Patient tolerated treatment well    Behavior During Therapy  [facility] for tasks assessed/performed       Past Medical History:  Diagnosis Date  . Abnormal Pap smear of cervix   . Allergic rhinitis 11/08/2010  . Anxiety   . Asthma   . Bronchitis   . Herpes simplex of female genitalia   . Hyperlipidemia   . Mitral valve prolapse   . Osteoporosis 11/21/2011  . Urticaria     Past Surgical History:  Procedure Laterality Date  . CLOSED MANIPULATION SHOULDER  7&01/2005   x2, 30 days apart  . CLOSED MANIPULATION SHOULDER  2006   Left  . COLONOSCOPY  09/2012   Medium sized external hemorrhoids, few small divertic in left colon, otherwise normal colon (repeat 10 yrs)  . ESOPHAGOGASTRODUODENOSCOPY  09/2012   Normal (Dr. Ardis Hughs)  . LEEP  11/2004    There were no vitals filed for this visit.  Subjective Assessment - 09/12/17 1151    Subjective  Pt reports she is sore today.  She used water massage table at gym and thinks it might have aggrivated her neck.      Patient Stated Goals  get where she can do her grocery shopping without pain     Currently in Pain?  Yes    Pain Score  8     Pain Location  Back    Pain Orientation  Lower    Pain Descriptors / Indicators  Aching;Sore    Aggravating Factors   being still     Pain Relieving Factors  gentle exercise.        [facility] Adult PT Treatment/Exercise - 09/12/17 0001      Lumbar Exercises:  Stretches   Double Knee to Chest Stretch  30 seconds;2 reps    Lower Trunk Rotation  2 reps;20 seconds gentle; arms in T    Other Lumbar Stretch Exercise  whole body stretch      Lumbar Exercises: Aerobic   Nustep  L5x 63min (arms and legs) PTA present to discuss progress      Lumbar Exercises: Standing   Other Standing Lumbar Exercises  scap retraction with back against pool noodle and W's x 20 reps       Lumbar Exercises: Supine   Clam  10 reps green band around thighs. core engaged.     Bent Knee Raise  15 reps core engaged.     Single Leg Bridge  20 reps each side, good form    Other Supine Lumbar Exercises  overhead pull with yellow band x 20;  bilat shoulder ER with yellow band x 20 reps      Lumbar Exercises: Prone   Opposite Arm/Leg Raise  Right arm/Left leg;Left arm/Right leg;10 reps core engaged.     Other Prone Lumbar Exercises  axial ext with shoulder ext /  scap retraction x 10; goal posts with axial ext x 10  (some discomfort in LB).       Modalities   Modalities  Moist Heat      Moist Heat Therapy   Number Minutes Moist Heat  15 Minutes during supine therapeutic ex    Moist Heat Location  Lumbar Spine;Cervical                  PT Long Term Goals - 09/10/17 0854      PT LONG TERM GOAL #1   Title  I with HEP ( 10/15/17)     Status  On-going      PT LONG TERM GOAL #2   Title  report =/> 50% reduction in overall body pain to allow her to push through exercise ( 10/15/17)    Status  On-going more sore now due to adding in more exercise      PT LONG TERM GOAL #3   Title  improve U/LE strength -/> 5-/5 with minimal pain ( 10/15/17)     Status  On-going      PT LONG TERM GOAL #4   Title  improve cervical rotation =/> 70 degrees bilat. ( 10/15/17)     Status  On-going      PT LONG TERM GOAL #5   Title  improve FOTO =/< 47% limited (10/15/17)     Status  On-going            Plan - 09/12/17 1246    Clinical Impression Statement  Pt initially  reported pain score of 8/10 in neck and back.  She tolerated prone ext exercises during performance of them, but reported increased pain shortly after.  Pain eased with use of MHP on neck and low back with supine core work; reduced pain rating of 4/10 at end of session. Pt making gradual progress towards goals.     Rehab Potential  Good    PT Frequency  2x / week    PT Duration  6 weeks    PT Treatment/Interventions  Iontophoresis 4mg /ml Dexamethasone;Manual techniques;Moist Heat;Patient/family education;Taping;Therapeutic exercise;Cryotherapy;Electrical Stimulation    PT Next Visit Plan  progress overall body strengthening.     Consulted and Agree with Plan of Care  Patient       Patient will benefit from skilled therapeutic intervention in order to improve the following deficits and impairments:  Pain, Decreased strength, Impaired UE functional use  Visit Diagnosis: Pain in both upper extremities  Weakness generalized  Chronic bilateral low back pain without sciatica  Neck pain     Problem List Patient Active Problem List   Diagnosis Date Noted  . History of loop electrical excision procedure (LEEP) 07/01/2017  . Unintentional weight loss 05/18/2017  . Elevated LDL cholesterol level 05/18/2017  . Hyperlipidemia 05/18/2017  . Irritable bowel syndrome with both constipation and diarrhea 05/18/2017  . Family history of pancreatic cancer 02/18/2017  . Diarrhea 02/18/2017  . Loss of weight 02/18/2017  . Weakness 10/05/2016  . Panic attacks 09/18/2016  . SOB (shortness of breath) 09/18/2016  . Bipolar 1 disorder with moderate mania ([place]) 09/17/2016  . Pulmonary nodule 08/08/2016  . Hoarseness 03/09/2016  . Left knee pain 11/09/2015  . Pain in joint, lower leg 11/09/2015  . Plantar fasciitis of left foot 11/09/2015  . Cough, persistent 07/21/2015  . Post-nasal drip 07/21/2015  . DDD (degenerative disc disease), cervical 07/05/2015  . Chronic nasal congestion 03/01/2015  .  Vitreous floaters of right eye  03/01/2015  . Near syncope 02/09/2015  . Rhinitis, allergic 10/14/2014  . Family history of renal cancer 10/11/2014  . DDD (degenerative disc disease), lumbar 10/11/2014  . Bipolar disorder with depression ([place]) 06/02/2014  . Postmenopausal atrophic vaginitis 03/17/2014  . Prediabetes 12/14/2013  . Encounter for gynecological examination with Papanicolaou smear of cervix 12/08/2013  . Adhesive capsulitis of right shoulder 11/24/2013  . Chronic periodontal disease 06/30/2013  . Genital herpes 11/05/2012  . Hemorrhoids 11/05/2012  . Borderline intellectual functioning 02/15/2012  . Osteoporosis 11/21/2011  . Allergic rhinitis 11/08/2010  . Insomnia 11/08/2010  . GERD (gastroesophageal reflux disease) 09/27/2010   Kerin Perna, PTA 09/12/17 12:55 PM  [place], [place], [address] Phone: [phone]   Fax:  [phone]  Name: Kellie [name] MRN: [id number] Date of Birth: 1955/11/03

## 2017-09-17 ENCOUNTER — Encounter: Payer: Self-pay | Admitting: Physical Therapy

## 2017-09-17 ENCOUNTER — Ambulatory Visit (INDEPENDENT_AMBULATORY_CARE_PROVIDER_SITE_OTHER): Payer: No Typology Code available for payment source | Admitting: Physical Therapy

## 2017-09-17 DIAGNOSIS — M545 Low back pain, unspecified: Secondary | ICD-10-CM

## 2017-09-17 DIAGNOSIS — M62838 Other muscle spasm: Secondary | ICD-10-CM

## 2017-09-17 DIAGNOSIS — M542 Cervicalgia: Secondary | ICD-10-CM

## 2017-09-17 DIAGNOSIS — M79602 Pain in left arm: Secondary | ICD-10-CM

## 2017-09-17 DIAGNOSIS — G8929 Other chronic pain: Secondary | ICD-10-CM

## 2017-09-17 DIAGNOSIS — M79601 Pain in right arm: Secondary | ICD-10-CM

## 2017-09-17 DIAGNOSIS — R531 Weakness: Secondary | ICD-10-CM

## 2017-09-17 NOTE — Therapy (Signed)
Kellie [name], [place], [address] Phone: [phone]   Fax:  [phone]  Physical Therapy Treatment  Patient Details  Name: Kellie [name] MRN: [id number] Date of Birth: 1956/01/22 Referring Provider: Dr Darene Lamer   Encounter Date: 09/17/2017  PT End of Session - 09/17/17 0803    Visit Number  4    Number of Visits  12    Date for PT Re-Evaluation  10/15/17    PT Start Time  0803    PT Stop Time  0856    PT Time Calculation (min)  53 min    Activity Tolerance  Patient tolerated treatment well       Past Medical History:  Diagnosis Date  . Abnormal Pap smear of cervix   . Allergic rhinitis 11/08/2010  . Anxiety   . Asthma   . Bronchitis   . Herpes simplex of female genitalia   . Hyperlipidemia   . Mitral valve prolapse   . Osteoporosis 11/21/2011  . Urticaria     Past Surgical History:  Procedure Laterality Date  . CLOSED MANIPULATION SHOULDER  7&01/2005   x2, 30 days apart  . CLOSED MANIPULATION SHOULDER  2006   Left  . COLONOSCOPY  09/2012   Medium sized external hemorrhoids, few small divertic in left colon, otherwise normal colon (repeat 10 yrs)  . ESOPHAGOGASTRODUODENOSCOPY  09/2012   Normal (Dr. Ardis Hughs)  . LEEP  11/2004    There were no vitals filed for this visit.  Subjective Assessment - 09/17/17 0805    Subjective  Kellie [name] went to the gym yesterday.  She is concerned because her legs and neck hurt. She has been doing seated hip abduct/add - recommend she stop this and perform the standing one.     Patient Stated Goals  get where she can do her grocery shopping without pain     Currently in Pain?  Yes    Pain Score  8     Pain Location  Back    Pain Orientation  Left;Right    Pain Descriptors / Indicators  Sharp    Pain Type  Chronic pain    Pain Onset  More than a month ago    Pain Frequency  Intermittent    Aggravating Factors   not sure    Pain Relieving Factors  gentle excercise          [facility] PT Assessment - 09/17/17 0001      Assessment   Medical Diagnosis  lumbar and cervical DDD    Referring Provider  Dr T    Onset Date/Surgical Date  03/06/17      AROM   Cervical - Right Rotation  57    Cervical - Left Rotation  68      Strength   Strength Assessment Site  Shoulder;Hip    Right/Left Shoulder  -- bilat 5-/5    Right Hip Flexion  5/5    Right Hip Extension  4/5    Right Hip ABduction  -- 5-/5    Left Hip Flexion  5/5 5-/5    Left Hip Extension  4/5    Left Hip ABduction  4+/5            No data recorded       [facility] Adult PT Treatment/Exercise - 09/17/17 0001      Lumbar Exercises: Stretches   Double Knee to Chest Stretch  30 seconds;2 reps    Piriformis Stretch  Left;Right;30 seconds    Other Lumbar Stretch Exercise  lateral neck stetches      Lumbar Exercises: Aerobic   Nustep  L5x 9mn (arms and legs)      Lumbar Exercises: Supine   Single Leg Bridge  10 reps 3 sets with 10# wt, VC for form with wt    Other Supine Lumbar Exercises  gentle head presses      Lumbar Exercises: Quadruped   Other Quadruped Lumbar Exercises  hip ext with black band 2x10      Modalities   Modalities  Moist Heat      Moist Heat Therapy   Number Minutes Moist Heat  15 Minutes    Moist Heat Location  Cervical                  PT Long Term Goals - 09/17/17 0809      PT LONG TERM GOAL #1   Title  I with HEP ( 10/15/17)     Status  On-going      PT LONG TERM GOAL #2   Title  report =/> 50% reduction in overall body pain to allow her to push through exercise ( 10/15/17)    Status  On-going      PT LONG TERM GOAL #3   Title  improve U/LE strength -/> 5-/5 with minimal pain ( 10/15/17)     Status  Partially Met      PT LONG TERM GOAL #4   Title  improve cervical rotation =/> 70 degrees bilat. ( 10/15/17)     Status  On-going making progress to the LT      PT LONG TERM GOAL #5   Title  improve FOTO =/< 47% limited (10/15/17)      Status  On-going            Plan - 09/17/17 0832    Clinical Impression Statement  Kellie [name] making progress, she has partially met one goal.  She is getting stronger in both upper and lower body. Still having some pain however that is chronic and may not improve all the way.        Patient will benefit from skilled therapeutic intervention in order to improve the following deficits and impairments:     Visit Diagnosis: Pain in both upper extremities  Weakness generalized  Chronic bilateral low back pain without sciatica  Neck pain  Other muscle spasm     Problem List Patient Active Problem List   Diagnosis Date Noted  . History of loop electrical excision procedure (LEEP) 07/01/2017  . Unintentional weight loss 05/18/2017  . Elevated LDL cholesterol level 05/18/2017  . Hyperlipidemia 05/18/2017  . Irritable bowel syndrome with both constipation and diarrhea 05/18/2017  . Family history of pancreatic cancer 02/18/2017  . Diarrhea 02/18/2017  . Loss of weight 02/18/2017  . Weakness 10/05/2016  . Panic attacks 09/18/2016  . SOB (shortness of breath) 09/18/2016  . Bipolar 1 disorder with moderate mania ([place] 09/17/2016  . Pulmonary nodule 08/08/2016  . Hoarseness 03/09/2016  . Left knee pain 11/09/2015  . Pain in joint, lower leg 11/09/2015  . Plantar fasciitis of left foot 11/09/2015  . Cough, persistent 07/21/2015  . Post-nasal drip 07/21/2015  . DDD (degenerative disc disease), cervical 07/05/2015  . Chronic nasal congestion 03/01/2015  . Vitreous floaters of right eye 03/01/2015  . Near syncope 02/09/2015  . Rhinitis, allergic 10/14/2014  . Family history of renal cancer 10/11/2014  . DDD (degenerative disc  disease), lumbar 10/11/2014  . Bipolar disorder with depression ([place]) 06/02/2014  . Postmenopausal atrophic vaginitis 03/17/2014  . Prediabetes 12/14/2013  . Encounter for gynecological examination with Papanicolaou smear of cervix 12/08/2013  .  Adhesive capsulitis of right shoulder 11/24/2013  . Chronic periodontal disease 06/30/2013  . Genital herpes 11/05/2012  . Hemorrhoids 11/05/2012  . Borderline intellectual functioning 02/15/2012  . Osteoporosis 11/21/2011  . Allergic rhinitis 11/08/2010  . Insomnia 11/08/2010  . GERD (gastroesophageal reflux disease) 09/27/2010    Kellie [name] PT  09/17/2017, 8:40 AM  [facility] [place], [place], [address] Phone: [phone]   Fax:  [phone]  Name: Kellie [name] MRN: [id number] Date of Birth: 08-31-1955

## 2017-09-18 ENCOUNTER — Ambulatory Visit (INDEPENDENT_AMBULATORY_CARE_PROVIDER_SITE_OTHER): Payer: Self-pay | Admitting: Licensed Clinical Social Worker

## 2017-09-18 DIAGNOSIS — F310 Bipolar disorder, current episode hypomanic: Secondary | ICD-10-CM

## 2017-09-19 ENCOUNTER — Ambulatory Visit (INDEPENDENT_AMBULATORY_CARE_PROVIDER_SITE_OTHER): Payer: No Typology Code available for payment source | Admitting: Physical Therapy

## 2017-09-19 ENCOUNTER — Encounter (HOSPITAL_COMMUNITY): Payer: Self-pay | Admitting: Licensed Clinical Social Worker

## 2017-09-19 ENCOUNTER — Encounter: Payer: Self-pay | Admitting: Physical Therapy

## 2017-09-19 DIAGNOSIS — M545 Low back pain: Secondary | ICD-10-CM

## 2017-09-19 DIAGNOSIS — M79601 Pain in right arm: Secondary | ICD-10-CM

## 2017-09-19 DIAGNOSIS — M79602 Pain in left arm: Secondary | ICD-10-CM

## 2017-09-19 DIAGNOSIS — M542 Cervicalgia: Secondary | ICD-10-CM

## 2017-09-19 DIAGNOSIS — G8929 Other chronic pain: Secondary | ICD-10-CM

## 2017-09-19 DIAGNOSIS — R531 Weakness: Secondary | ICD-10-CM

## 2017-09-19 NOTE — Therapy (Signed)
[place], [place], [address] Phone: [phone]   Fax:  [phone]  Physical Therapy Treatment  Patient Details  Name: Kellie [name] MRN: [id number] Date of Birth: 26-Jun-1955 Referring Provider: Dr Darene Lamer   Encounter Date: 09/19/2017  PT End of Session - 09/19/17 0850    Visit Number  5    Number of Visits  12    Date for PT Re-Evaluation  10/15/17    PT Start Time  0845    PT Stop Time  0930    PT Time Calculation (min)  45 min    Activity Tolerance  Patient tolerated treatment well    Behavior During Therapy  [facility] for tasks assessed/performed       Past Medical History:  Diagnosis Date  . Abnormal Pap smear of cervix   . Allergic rhinitis 11/08/2010  . Anxiety   . Asthma   . Bronchitis   . Herpes simplex of female genitalia   . Hyperlipidemia   . Mitral valve prolapse   . Osteoporosis 11/21/2011  . Urticaria     Past Surgical History:  Procedure Laterality Date  . CLOSED MANIPULATION SHOULDER  7&01/2005   x2, 30 days apart  . CLOSED MANIPULATION SHOULDER  2006   Left  . COLONOSCOPY  09/2012   Medium sized external hemorrhoids, few small divertic in left colon, otherwise normal colon (repeat 10 yrs)  . ESOPHAGOGASTRODUODENOSCOPY  09/2012   Normal (Dr. Ardis Hughs)  . LEEP  11/2004    There were no vitals filed for this visit.  Subjective Assessment - 09/19/17 0850    Subjective  Pt has gone to gym 3x this week and plans to go dancing tonight.  She is sore today but feels like she is getting stronger.       Currently in Pain?  Yes    Pain Score  5     Pain Location  Back    Pain Orientation  Right;Left;Lower    Pain Descriptors / Indicators  Aching    Aggravating Factors   prolonged standing, bending over at sink    Pain Relieving Factors  gentle exercise, heat.        [facility] Adult PT Treatment/Exercise - 09/19/17 0001      Self-Care   Self-Care  Other Self-Care Comments    Other  Self-Care Comments   Pt educated on self massage with ball to lumbar and glute musculature to decrease pain and fascial tightness.   Pt returned demo with VC      Lumbar Exercises: Stretches   Double Knee to Chest Stretch  30 seconds;2 reps    Prone on Elbows Stretch  1 rep;30 seconds    Other Lumbar Stretch Exercise  mid level doorway stretch x 20 sec x 2 reps      Lumbar Exercises: Aerobic   Nustep  L5x 82mn ( legs only)      Lumbar Exercises: Machines for Strengthening   Other Lumbar Machine Exercise  Chest press with 1 plate x 10 reps, shown row machine.  Lat pull down with 2 plates x 10 reps, 2 sets; tricep ext with 1 plate x 10 reps, 2 sets      Lumbar Exercises: Standing   Row  Strengthening;Both;10 reps;Theraband    Theraband Level (Row)  Level 2 (Red)    Other Standing Lumbar Exercises  bicep curls with 4# in hands x 10 reps, 2 sets      Lumbar Exercises:  Supine   Single Leg Bridge  10 reps 3 sets with 10# wt, VC for form with wt    Other Supine Lumbar Exercises  gentle head presses, 5 sec hold, x 10 reps                   PT Long Term Goals - 09/17/17 0809      PT LONG TERM GOAL #1   Title  I with HEP ( 10/15/17)     Status  On-going      PT LONG TERM GOAL #2   Title  report =/> 50% reduction in overall body pain to allow her to push through exercise ( 10/15/17)    Status  On-going      PT LONG TERM GOAL #3   Title  improve U/LE strength -/> 5-/5 with minimal pain ( 10/15/17)     Status  Partially Met      PT LONG TERM GOAL #4   Title  improve cervical rotation =/> 70 degrees bilat. ( 10/15/17)     Status  On-going making progress to the LT      PT LONG TERM GOAL #5   Title  improve FOTO =/< 47% limited (10/15/17)     Status  On-going            Plan - 09/19/17 0941    Clinical Impression Statement  Pt had some increased discomfort in low back with weighted single leg bridge; reduced with KTC stretch and change of position.  Focus was on core  engagement while completing whole body strengthening exercises.  She tolerated all exercises without increase in neck pain.  Progressing towards established goals.  Pt requests to decrease freq to 1x/wk due ot financial concerns.     Rehab Potential  Good    PT Frequency  1x / week chg per pt.     PT Duration  6 weeks    PT Treatment/Interventions  Iontophoresis 69m/ml Dexamethasone;Manual techniques;Moist Heat;Patient/family education;Taping;Therapeutic exercise;Cryotherapy;Electrical Stimulation    PT Next Visit Plan  progress overall body strengthening.     Consulted and Agree with Plan of Care  Patient       Patient will benefit from skilled therapeutic intervention in order to improve the following deficits and impairments:  Pain, Decreased strength, Impaired UE functional use  Visit Diagnosis: Pain in both upper extremities  Weakness generalized  Chronic bilateral low back pain without sciatica  Neck pain     Problem List Patient Active Problem List   Diagnosis Date Noted  . History of loop electrical excision procedure (LEEP) 07/01/2017  . Unintentional weight loss 05/18/2017  . Elevated LDL cholesterol level 05/18/2017  . Hyperlipidemia 05/18/2017  . Irritable bowel syndrome with both constipation and diarrhea 05/18/2017  . Family history of pancreatic cancer 02/18/2017  . Diarrhea 02/18/2017  . Loss of weight 02/18/2017  . Weakness 10/05/2016  . Panic attacks 09/18/2016  . SOB (shortness of breath) 09/18/2016  . Bipolar 1 disorder with moderate mania ([place] 09/17/2016  . Pulmonary nodule 08/08/2016  . Hoarseness 03/09/2016  . Left knee pain 11/09/2015  . Pain in joint, lower leg 11/09/2015  . Plantar fasciitis of left foot 11/09/2015  . Cough, persistent 07/21/2015  . Post-nasal drip 07/21/2015  . DDD (degenerative disc disease), cervical 07/05/2015  . Chronic nasal congestion 03/01/2015  . Vitreous floaters of right eye 03/01/2015  . Near syncope 02/09/2015   . Rhinitis, allergic 10/14/2014  . Family history of renal cancer 10/11/2014  .  DDD (degenerative disc disease), lumbar 10/11/2014  . Bipolar disorder with depression ([place]) 06/02/2014  . Postmenopausal atrophic vaginitis 03/17/2014  . Prediabetes 12/14/2013  . Encounter for gynecological examination with Papanicolaou smear of cervix 12/08/2013  . Adhesive capsulitis of right shoulder 11/24/2013  . Chronic periodontal disease 06/30/2013  . Genital herpes 11/05/2012  . Hemorrhoids 11/05/2012  . Borderline intellectual functioning 02/15/2012  . Osteoporosis 11/21/2011  . Allergic rhinitis 11/08/2010  . Insomnia 11/08/2010  . GERD (gastroesophageal reflux disease) 09/27/2010   Kerin Perna, PTA 09/19/17 9:47 AM  [facility] [place], [place], [address] Phone: [phone]   Fax:  [phone]  Name: Kellie [name] MRN: [id number] Date of Birth: 08-22-1955
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
## 2017-09-24 ENCOUNTER — Encounter: Payer: Self-pay | Admitting: Physical Therapy

## 2017-09-24 ENCOUNTER — Ambulatory Visit (INDEPENDENT_AMBULATORY_CARE_PROVIDER_SITE_OTHER): Payer: No Typology Code available for payment source | Admitting: Physical Therapy

## 2017-09-24 DIAGNOSIS — M79601 Pain in right arm: Secondary | ICD-10-CM

## 2017-09-24 DIAGNOSIS — M545 Low back pain, unspecified: Secondary | ICD-10-CM

## 2017-09-24 DIAGNOSIS — M79602 Pain in left arm: Secondary | ICD-10-CM

## 2017-09-24 DIAGNOSIS — R531 Weakness: Secondary | ICD-10-CM

## 2017-09-24 DIAGNOSIS — G8929 Other chronic pain: Secondary | ICD-10-CM

## 2017-09-24 NOTE — Therapy (Signed)
[facility] [place], [place], [address] Phone: [phone]   Fax:  [phone]  Physical Therapy Treatment  Patient Details  Name: Kellie [name] MRN: [id number] Date of Birth: June 09, 1956 Referring Provider: Dr Darene Lamer   Encounter Date: 09/24/2017  PT End of Session - 09/24/17 0847    Visit Number  6    Number of Visits  12    Date for PT Re-Evaluation  10/15/17    PT Start Time  0847    PT Stop Time  0926    PT Time Calculation (min)  39 min    Activity Tolerance  Patient tolerated treatment well       Past Medical History:  Diagnosis Date  . Abnormal Pap smear of cervix   . Allergic rhinitis 11/08/2010  . Anxiety   . Asthma   . Bronchitis   . Herpes simplex of female genitalia   . Hyperlipidemia   . Mitral valve prolapse   . Osteoporosis 11/21/2011  . Urticaria     Past Surgical History:  Procedure Laterality Date  . CLOSED MANIPULATION SHOULDER  7&01/2005   x2, 30 days apart  . CLOSED MANIPULATION SHOULDER  2006   Left  . COLONOSCOPY  09/2012   Medium sized external hemorrhoids, few small divertic in left colon, otherwise normal colon (repeat 10 yrs)  . ESOPHAGOGASTRODUODENOSCOPY  09/2012   Normal (Dr. Ardis Hughs)  . LEEP  11/2004    There were no vitals filed for this visit.  Subjective Assessment - 09/24/17 0850    Subjective  Went out dancing last week for 3 days. She had onset of bilat lateral LE pain at night since then.     Patient Stated Goals  get where she can do her grocery shopping without pain     Currently in Pain?  Yes    Pain Score  8     Pain Location  Back    Pain Orientation  Right    Pain Descriptors / Indicators  Constant    Pain Type  Chronic pain    Pain Radiating Towards  into the groin    Pain Onset  More than a month ago    Pain Frequency  Intermittent    Aggravating Factors   nothing in particular    Pain Relieving Factors  nothing over the last couple of days     Effect of Pain on Daily Activities  also having major pain in the lateral legs at night that she can't get rid of feels like a toothache         [facility] PT Assessment - 09/24/17 0001      Assessment   Medical Diagnosis  lumbar and cervical DDD      AROM   Cervical - Right Rotation  52    Cervical - Left Rotation  58      Strength   Right Hip Flexion  5/5    Right Hip Extension  -- 5-/5    Right Hip ABduction  5/5    Left Hip Flexion  5/5    Left Hip Extension  5/5    Left Hip ABduction  5/5                   [facility] Adult PT Treatment/Exercise - 09/24/17 0001      Lumbar Exercises: Aerobic   Nustep  L5 x 6' arms and legs      Lumbar Exercises: Supine  Other Supine Lumbar Exercises  15 reps thoracic lifts, pilates table tops with heel taps, the LTR with abdominal control      Lumbar Exercises: Sidelying   Other Sidelying Lumbar Exercises  10 reps each pilates, FWD/BWD kicks, CW/CCW cirlces, FED/BWD taps with arch each side.       Lumbar Exercises: Prone   Other Prone Lumbar Exercises  POE,  5 reps pelvic presses, then 10 hip ext 2x10 pelvic press with bilat knee flex/ext                  PT Long Term Goals - 09/24/17 6387      PT LONG TERM GOAL #1   Title  I with HEP ( 10/15/17)     Status  On-going      PT LONG TERM GOAL #2   Title  report =/> 50% reduction in overall body pain to allow her to push through exercise ( 10/15/17)    Status  On-going      PT LONG TERM GOAL #3   Title  improve U/LE strength -/> 5-/5 with minimal pain ( 10/15/17)     Status  Achieved      PT LONG TERM GOAL #4   Title  improve cervical rotation =/> 70 degrees bilat. ( 10/15/17)       PT LONG TERM GOAL #5   Title  improve FOTO =/< 47% limited (10/15/17)     Status  On-going            Plan - 09/24/17 0917    Clinical Impression Statement  Shakela had some increased pain in her back and legs today after having 3 days in a row where she went out dancing.  This  settled down a little with exercise.  She has met her hip strength, had some decreased motion in her neck.  Overall she is improving and making progress to her goals.  Would benefit from some manual work to her neck to include STM and gentle PROM    Rehab Potential  Good    PT Frequency  1x / week    PT Treatment/Interventions  Iontophoresis 12m/ml Dexamethasone;Manual techniques;Moist Heat;Patient/family education;Taping;Therapeutic exercise;Cryotherapy;Electrical Stimulation    PT Next Visit Plan  STM to neck with gentle PROM in all directions, progress overall body strengthening, modalities PRN.     Consulted and Agree with Plan of Care  Patient       Patient will benefit from skilled therapeutic intervention in order to improve the following deficits and impairments:  Pain, Decreased strength, Impaired UE functional use  Visit Diagnosis: Pain in both upper extremities  Weakness generalized  Chronic bilateral low back pain without sciatica     Problem List Patient Active Problem List   Diagnosis Date Noted  . History of loop electrical excision procedure (LEEP) 07/01/2017  . Unintentional weight loss 05/18/2017  . Elevated LDL cholesterol level 05/18/2017  . Hyperlipidemia 05/18/2017  . Irritable bowel syndrome with both constipation and diarrhea 05/18/2017  . Family history of pancreatic cancer 02/18/2017  . Diarrhea 02/18/2017  . Loss of weight 02/18/2017  . Weakness 10/05/2016  . Panic attacks 09/18/2016  . SOB (shortness of breath) 09/18/2016  . Bipolar 1 disorder with moderate mania ([place] 09/17/2016  . Pulmonary nodule 08/08/2016  . Hoarseness 03/09/2016  . Left knee pain 11/09/2015  . Pain in joint, lower leg 11/09/2015  . Plantar fasciitis of left foot 11/09/2015  . Cough, persistent 07/21/2015  . Post-nasal drip 07/21/2015  .  DDD (degenerative disc disease), cervical 07/05/2015  . Chronic nasal congestion 03/01/2015  . Vitreous floaters of right eye 03/01/2015   . Near syncope 02/09/2015  . Rhinitis, allergic 10/14/2014  . Family history of renal cancer 10/11/2014  . DDD (degenerative disc disease), lumbar 10/11/2014  . Bipolar disorder with depression ([place]) 06/02/2014  . Postmenopausal atrophic vaginitis 03/17/2014  . Prediabetes 12/14/2013  . Encounter for gynecological examination with Papanicolaou smear of cervix 12/08/2013  . Adhesive capsulitis of right shoulder 11/24/2013  . Chronic periodontal disease 06/30/2013  . Genital herpes 11/05/2012  . Hemorrhoids 11/05/2012  . Borderline intellectual functioning 02/15/2012  . Osteoporosis 11/21/2011  . Allergic rhinitis 11/08/2010  . Insomnia 11/08/2010  . GERD (gastroesophageal reflux disease) 09/27/2010    Jeral Pinch PT  09/24/2017, 9:26 AM  [facility] [place], [place], [address] Phone: [phone]   Fax:  [phone]  Name: BRIDIE COLQUHOUN MRN: [id number] Date of Birth: Oct 18, 1955

## 2017-09-26 ENCOUNTER — Encounter: Payer: Self-pay | Admitting: Physical Therapy

## 2017-10-03 ENCOUNTER — Ambulatory Visit (INDEPENDENT_AMBULATORY_CARE_PROVIDER_SITE_OTHER): Payer: No Typology Code available for payment source | Admitting: Physical Therapy

## 2017-10-03 ENCOUNTER — Encounter: Payer: Self-pay | Admitting: Physical Therapy

## 2017-10-03 DIAGNOSIS — M545 Low back pain: Secondary | ICD-10-CM

## 2017-10-03 DIAGNOSIS — R531 Weakness: Secondary | ICD-10-CM

## 2017-10-03 DIAGNOSIS — M79602 Pain in left arm: Secondary | ICD-10-CM

## 2017-10-03 DIAGNOSIS — M79601 Pain in right arm: Secondary | ICD-10-CM

## 2017-10-03 DIAGNOSIS — G8929 Other chronic pain: Secondary | ICD-10-CM

## 2017-10-03 NOTE — Patient Instructions (Signed)
Access Code: [email or web address]  URL: [email or web address]  Date: 10/03/2017  Prepared by: Jeral Pinch   Exercises  Single Leg Sit to Stand with Arms Crossed - 10 reps - 3 sets - 1x daily - 4x weekly

## 2017-10-03 NOTE — Therapy (Signed)
[facility], [place], [address] Phone: [phone]   Fax:  [phone]  Physical Therapy Treatment  Patient Details  Name: Kellie [name] MRN: [id number] Date of Birth: 1956/03/26 Referring Provider: Dr Darene Lamer   Encounter Date: 10/03/2017  PT End of Session - 10/03/17 0844    Visit Number  7    Number of Visits  12    Date for PT Re-Evaluation  10/15/17    PT Start Time  0844    PT Stop Time  0925    PT Time Calculation (min)  41 min    Activity Tolerance  Patient tolerated treatment well       Past Medical History:  Diagnosis Date  . Abnormal Pap smear of cervix   . Allergic rhinitis 11/08/2010  . Anxiety   . Asthma   . Bronchitis   . Herpes simplex of female genitalia   . Hyperlipidemia   . Mitral valve prolapse   . Osteoporosis 11/21/2011  . Urticaria     Past Surgical History:  Procedure Laterality Date  . CLOSED MANIPULATION SHOULDER  7&01/2005   x2, 30 days apart  . CLOSED MANIPULATION SHOULDER  2006   Left  . COLONOSCOPY  09/2012   Medium sized external hemorrhoids, few small divertic in left colon, otherwise normal colon (repeat 10 yrs)  . ESOPHAGOGASTRODUODENOSCOPY  09/2012   Normal (Dr. Ardis Hughs)  . LEEP  11/2004    There were no vitals filed for this visit.  Subjective Assessment - 10/03/17 0847    Subjective  Today is a good day no pain, was sore yesterday. Went to the gym on Tuesday.     Currently in Pain?  No/denies         [facility] PT Assessment - 10/03/17 0001      Assessment   Medical Diagnosis  lumbar and cervical DDD      AROM   Cervical - Right Rotation  56    Cervical - Left Rotation  60                   [facility] Adult PT Treatment/Exercise - 10/03/17 0001      Self-Care   Other Self-Care Comments   self TPR with ball on wall into bilat buttocks and  low back      Lumbar Exercises: Aerobic   UBE (Upper Arm Bike)  L2x5' alt FWD/BWD standing on upside  down bosu       Lumbar Exercises: Standing   Other Standing Lumbar Exercises  single leg sit to stand from mat each side.       Lumbar Exercises: Supine   Basic Lumbar Stabilization  -- whole bolster, with leg raises, presses, opposite arm/leg li                  PT Long Term Goals - 10/03/17 0850      PT LONG TERM GOAL #1   Title  I with HEP ( 10/15/17)     Status  On-going      PT LONG TERM GOAL #2   Title  report =/> 50% reduction in overall body pain to allow her to push through exercise ( 10/15/17)    Status  Achieved as of today, not consistent      PT LONG TERM GOAL #3   Title  improve U/LE strength -/> 5-/5 with minimal pain ( 10/15/17)     Status  Achieved  PT LONG TERM GOAL #4   Title  improve cervical rotation =/> 70 degrees bilat. ( 10/15/17)     Status  On-going      PT LONG TERM GOAL #5   Title  improve FOTO =/< 47% limited (10/15/17)     Status  On-going            Plan - 10/03/17 0916    Clinical Impression Statement  Kellie [name] is having a good day today, responded well to self TPR  in her buttocks. She required VC for form with new exercise, struggled with SLS sit to stands, did better with ex on bolster.  Making steady progress to goals.     Rehab Potential  Good    PT Frequency  1x / week    PT Duration  6 weeks    PT Treatment/Interventions  Iontophoresis 4mg /ml Dexamethasone;Manual techniques;Moist Heat;Patient/family education;Taping;Therapeutic exercise;Cryotherapy;Electrical Stimulation    PT Next Visit Plan  cont with core stability    Consulted and Agree with Plan of Care  Patient       Patient will benefit from skilled therapeutic intervention in order to improve the following deficits and impairments:  Pain, Decreased strength, Impaired UE functional use  Visit Diagnosis: Pain in both upper extremities  Weakness generalized  Chronic bilateral low back pain without sciatica     Problem List Patient Active Problem List    Diagnosis Date Noted  . History of loop electrical excision procedure (LEEP) 07/01/2017  . Unintentional weight loss 05/18/2017  . Elevated LDL cholesterol level 05/18/2017  . Hyperlipidemia 05/18/2017  . Irritable bowel syndrome with both constipation and diarrhea 05/18/2017  . Family history of pancreatic cancer 02/18/2017  . Diarrhea 02/18/2017  . Loss of weight 02/18/2017  . Weakness 10/05/2016  . Panic attacks 09/18/2016  . SOB (shortness of breath) 09/18/2016  . Bipolar 1 disorder with moderate mania ([place]) 09/17/2016  . Pulmonary nodule 08/08/2016  . Hoarseness 03/09/2016  . Left knee pain 11/09/2015  . Pain in joint, lower leg 11/09/2015  . Plantar fasciitis of left foot 11/09/2015  . Cough, persistent 07/21/2015  . Post-nasal drip 07/21/2015  . DDD (degenerative disc disease), cervical 07/05/2015  . Chronic nasal congestion 03/01/2015  . Vitreous floaters of right eye 03/01/2015  . Near syncope 02/09/2015  . Rhinitis, allergic 10/14/2014  . Family history of renal cancer 10/11/2014  . DDD (degenerative disc disease), lumbar 10/11/2014  . Bipolar disorder with depression ([place]) 06/02/2014  . Postmenopausal atrophic vaginitis 03/17/2014  . Prediabetes 12/14/2013  . Encounter for gynecological examination with Papanicolaou smear of cervix 12/08/2013  . Adhesive capsulitis of right shoulder 11/24/2013  . Chronic periodontal disease 06/30/2013  . Genital herpes 11/05/2012  . Hemorrhoids 11/05/2012  . Borderline intellectual functioning 02/15/2012  . Osteoporosis 11/21/2011  . Allergic rhinitis 11/08/2010  . Insomnia 11/08/2010  . GERD (gastroesophageal reflux disease) 09/27/2010    Jeral Pinch PT  10/03/2017, 9:26 AM  [facility] [place], [place], [address] Phone: [phone]   Fax:  [phone]  Name: Kellie [name] MRN: [id number] Date of Birth: 1956-02-12

## 2017-10-04 ENCOUNTER — Ambulatory Visit: Payer: Self-pay | Admitting: Physician Assistant

## 2017-10-07 ENCOUNTER — Encounter: Payer: Self-pay | Admitting: Physical Therapy

## 2017-10-07 ENCOUNTER — Ambulatory Visit: Payer: Self-pay | Admitting: Physician Assistant

## 2017-10-07 ENCOUNTER — Ambulatory Visit (INDEPENDENT_AMBULATORY_CARE_PROVIDER_SITE_OTHER): Payer: Self-pay | Admitting: Physical Therapy

## 2017-10-07 ENCOUNTER — Telehealth: Payer: Self-pay

## 2017-10-07 DIAGNOSIS — G8929 Other chronic pain: Secondary | ICD-10-CM

## 2017-10-07 DIAGNOSIS — M545 Low back pain: Secondary | ICD-10-CM

## 2017-10-07 DIAGNOSIS — M542 Cervicalgia: Secondary | ICD-10-CM

## 2017-10-07 DIAGNOSIS — M79601 Pain in right arm: Secondary | ICD-10-CM

## 2017-10-07 DIAGNOSIS — R531 Weakness: Secondary | ICD-10-CM

## 2017-10-07 DIAGNOSIS — M79602 Pain in left arm: Secondary | ICD-10-CM

## 2017-10-07 NOTE — Therapy (Addendum)
[place], [place], [address] Phone: [phone]   Fax:  [phone]  Physical Therapy Treatment  Patient Details  Name: Kellie [name] MRN: [id number] Date of Birth: [birth year] Referring Provider: Dr Dianah Field   Encounter Date: 10/07/2017  PT End of Session - 10/07/17 0717    Visit Number  8    Number of Visits  12    Date for PT Re-Evaluation  10/15/17    PT Start Time  0717    PT Stop Time  0800    PT Time Calculation (min)  43 min    Activity Tolerance  Patient tolerated treatment well       Past Medical History:  Diagnosis Date  . Abnormal Pap smear of cervix   . Allergic rhinitis 11/08/2010  . Anxiety   . Asthma   . Bronchitis   . Herpes simplex of female genitalia   . Hyperlipidemia   . Mitral valve prolapse   . Osteoporosis 11/21/2011  . Urticaria     Past Surgical History:  Procedure Laterality Date  . CLOSED MANIPULATION SHOULDER  7&01/2005   x2, 30 days apart  . CLOSED MANIPULATION SHOULDER  2006   Left  . COLONOSCOPY  09/2012   Medium sized external hemorrhoids, few small divertic in left colon, otherwise normal colon (repeat 10 yrs)  . ESOPHAGOGASTRODUODENOSCOPY  09/2012   Normal (Dr. Ardis Hughs)  . LEEP  11/2004    There were no vitals filed for this visit.  Subjective Assessment - 10/07/17 0717    Subjective  Kellie [name] is having a little bit of pain today in her back and Rt neck    Patient Stated Goals  get where she can do her grocery shopping without pain     Currently in Pain?  Yes    Pain Score  2     Pain Location  Back    Pain Orientation  Right;Left    Pain Descriptors / Indicators  Aching    Pain Type  Chronic pain    Pain Onset  More than a month ago    Pain Frequency  Intermittent    Aggravating Factors   not sure    Pain Relieving Factors  rest         [facility] PT Assessment - 10/07/17 0001      Assessment   Medical Diagnosis  lumbar and cervical DDD     Referring Provider  Dr Dianah Field    Onset Date/Surgical Date  03/06/17      AROM   Cervical - Right Rotation  54 after manaul work61    Cervical - Left Rotation  58 63 after manual work                   [facility] Adult PT Treatment/Exercise - 10/07/17 0001      Lumbar Exercises: Stretches   Double Knee to Chest Stretch  20 seconds    Lower Trunk Rotation  30 seconds      Lumbar Exercises: Aerobic   UBE (Upper Arm Bike)  L2x5' alt FWD/BWD standing on upside down bosu       Lumbar Exercises: Supine   Clam  15 reps black band, bilat and single VC for form    Bridge with clamshell  20 reps black band around knees, UE diag green band    Other Supine Lumbar Exercises  LE in table top position, 3x10 tricep ext with 3#. , head presses VC  for form      Manual Therapy   Manual Therapy  Joint mobilization;Soft tissue mobilization;Manual Traction    Joint Mobilization  grade II cervical for rotation     Soft tissue mobilization  bilat SCM tight, manual STM to bilat cervical paraspinals    Manual Traction  gentle cervical                  PT Long Term Goals - 10/07/17 0719      PT LONG TERM GOAL #1   Title  I with HEP ( 10/15/17)     Status  On-going      PT LONG TERM GOAL #2   Title  report =/> 50% reduction in overall body pain to allow her to push through exercise ( 10/15/17)    Status  Achieved      PT LONG TERM GOAL #3   Title  improve U/LE strength -/> 5-/5 with minimal pain ( 10/15/17)     Status  Achieved      PT LONG TERM GOAL #4   Title  improve cervical rotation =/> 70 degrees bilat. ( 10/15/17)     Status  On-going      PT LONG TERM GOAL #5   Title  improve FOTO =/< 47% limited (10/15/17)     Status  On-going            Plan - 10/07/17 0754    Clinical Impression Statement  Kellie [name] is getting stronger through her core. She does have some tightness in her neck, responded well to manual work and picked up some motion. No new goals met. Will  focus more on manual work to decrease neck tightness.     Rehab Potential  Good    PT Frequency  1x / week    PT Duration  6 weeks    PT Treatment/Interventions  Iontophoresis 65m/ml Dexamethasone;Manual techniques;Moist Heat;Patient/family education;Taping;Therapeutic exercise;Cryotherapy;Electrical Stimulation    PT Next Visit Plan  manual work to nPraxairand Agree with Plan of Care  Patient       Patient will benefit from skilled therapeutic intervention in order to improve the following deficits and impairments:  Pain, Decreased strength, Impaired UE functional use  Visit Diagnosis: Pain in both upper extremities  Weakness generalized  Chronic bilateral low back pain without sciatica  Neck pain     Problem List Patient Active Problem List   Diagnosis Date Noted  . History of loop electrical excision procedure (LEEP) 07/01/2017  . Unintentional weight loss 05/18/2017  . Elevated LDL cholesterol level 05/18/2017  . Hyperlipidemia 05/18/2017  . Irritable bowel syndrome with both constipation and diarrhea 05/18/2017  . Family history of pancreatic cancer 02/18/2017  . Diarrhea 02/18/2017  . Loss of weight 02/18/2017  . Weakness 10/05/2016  . Panic attacks 09/18/2016  . SOB (shortness of breath) 09/18/2016  . Bipolar 1 disorder with moderate mania ([place] 09/17/2016  . Pulmonary nodule 08/08/2016  . Hoarseness 03/09/2016  . Left knee pain 11/09/2015  . Pain in joint, lower leg 11/09/2015  . Plantar fasciitis of left foot 11/09/2015  . Cough, persistent 07/21/2015  . Post-nasal drip 07/21/2015  . DDD (degenerative disc disease), cervical 07/05/2015  . Chronic nasal congestion 03/01/2015  . Vitreous floaters of right eye 03/01/2015  . Near syncope 02/09/2015  . Rhinitis, allergic 10/14/2014  . Family history of renal cancer 10/11/2014  . DDD (degenerative disc disease), lumbar 10/11/2014  . Bipolar disorder with depression ([facility]  06/02/2014  . Postmenopausal  atrophic vaginitis 03/17/2014  . Prediabetes 12/14/2013  . Encounter for gynecological examination with Papanicolaou smear of cervix 12/08/2013  . Adhesive capsulitis of right shoulder 11/24/2013  . Chronic periodontal disease 06/30/2013  . Genital herpes 11/05/2012  . Hemorrhoids 11/05/2012  . Borderline intellectual functioning 02/15/2012  . Osteoporosis 11/21/2011  . Allergic rhinitis 11/08/2010  . Insomnia 11/08/2010  . GERD (gastroesophageal reflux disease) 09/27/2010    Jeral Pinch PT  10/07/2017, 7:58 AM  [facility] [place], [place], [address] Phone: [phone]   Fax:  [phone]  Name: Kellie [name] MRN: [id number] Date of Birth: [birth year]   PHYSICAL THERAPY DISCHARGE SUMMARY  Visits from Start of Care: 8  Current functional level related to goals / functional outcomes: Pt has good days and bad days as related to her pain and function.    Remaining deficits: See above   Education / Equipment: HEP Plan: Patient agrees to discharge.  Patient goals were partially met. Patient is being discharged due to financial reasons. Patient has been approved for MCD and we are unable to see this in our clinic ?????    Jeral Pinch, PT 11/05/17 10:35 AM

## 2017-10-07 NOTE — Telephone Encounter (Signed)
Pt is too early for refill. Looking at the controlled substance database, she got last refill filled on 09/24/17. She will not be due for next refill until 10/24/17. Pt advised no refill will be written at this time.  Do not see order for stress test. Pt states she was advised it would be done at [facility]? They do not do stress test, only echo's and Holter monitors. Routing to PCP for review on what order is to be placed.

## 2017-10-07 NOTE — Telephone Encounter (Signed)
PT needs refill on Xanax, pharmacy on file. PT has not received a call as of today to schedule her stress test.

## 2017-10-09 NOTE — Telephone Encounter (Signed)
Pt advised and transferred to scheduling.

## 2017-10-09 NOTE — Telephone Encounter (Signed)
I have not seen patient in a while.  Stress test is usually ordered for chest pain. I do not have any documentation of this recently. She needs an office visit.

## 2017-10-10 ENCOUNTER — Other Ambulatory Visit: Payer: Self-pay | Admitting: Physician Assistant

## 2017-10-14 ENCOUNTER — Ambulatory Visit: Payer: Self-pay | Admitting: Nutrition

## 2017-10-14 ENCOUNTER — Encounter: Payer: Self-pay | Admitting: Physical Therapy

## 2017-10-18 ENCOUNTER — Ambulatory Visit (HOSPITAL_COMMUNITY): Payer: Self-pay | Admitting: Licensed Clinical Social Worker

## 2017-10-21 ENCOUNTER — Other Ambulatory Visit: Payer: Self-pay | Admitting: Physician Assistant

## 2017-10-22 ENCOUNTER — Ambulatory Visit (INDEPENDENT_AMBULATORY_CARE_PROVIDER_SITE_OTHER): Payer: No Typology Code available for payment source | Admitting: Physician Assistant

## 2017-10-22 ENCOUNTER — Encounter: Payer: Self-pay | Admitting: Physician Assistant

## 2017-10-22 VITALS — BP 98/51 | HR 73 | Ht 66.0 in | Wt 111.0 lb

## 2017-10-22 DIAGNOSIS — R0602 Shortness of breath: Secondary | ICD-10-CM

## 2017-10-22 DIAGNOSIS — M810 Age-related osteoporosis without current pathological fracture: Secondary | ICD-10-CM

## 2017-10-22 DIAGNOSIS — J301 Allergic rhinitis due to pollen: Secondary | ICD-10-CM

## 2017-10-22 DIAGNOSIS — F419 Anxiety disorder, unspecified: Secondary | ICD-10-CM | POA: Insufficient documentation

## 2017-10-22 DIAGNOSIS — R0789 Other chest pain: Secondary | ICD-10-CM

## 2017-10-22 DIAGNOSIS — A6004 Herpesviral vulvovaginitis: Secondary | ICD-10-CM

## 2017-10-22 DIAGNOSIS — F410 Panic disorder [episodic paroxysmal anxiety] without agoraphobia: Secondary | ICD-10-CM

## 2017-10-22 MED ORDER — ALPRAZOLAM 0.5 MG PO TABS: ORAL_TABLET | ORAL | 5 refills | Status: DC

## 2017-10-22 MED ORDER — ACYCLOVIR 200 MG PO CAPS: 400.0000 mg | ORAL_CAPSULE | Freq: Two times a day (BID) | ORAL | 11 refills | Status: DC

## 2017-10-22 MED ORDER — ALENDRONATE SODIUM 70 MG PO TABS: 70.0000 mg | ORAL_TABLET | ORAL | 11 refills | Status: DC

## 2017-10-22 NOTE — Progress Notes (Signed)
Subjective:    Patient ID: Kellie [name], female    DOB: [birth year], 62 y.o.   MRN: [id number]  HPI  Pt is a 62 yo female who presents to the clinic to follow up on chest pain.   CP started back in June 2018 where she went to [facility]. Where labs, EKG, CTA, troponin were all reassuring and suspected dehydration causing symptoms. She continues to have episodic sharp chest pains that happen mostly at rest but a few times while she is walking. On average having 2 a month. They only last a few seconds. She is not aware of any trigger. She is also SOB when she walks. 07/2016 PFT normal. No wheezing. She does have a lot of sinus and nasal congestion all the time. She feels like she is always coughing stuff up. Went to ENT and could not find anything wrong. She states there is "abesetosis in her apartment".  She also has a lot of food intolerances and wonders if that could be making worse.   Requests some refills on xanax, fosamax, valtrex.   .. Active Ambulatory Problems    Diagnosis Date Noted  . GERD (gastroesophageal reflux disease) 09/27/2010  . Allergic rhinitis 11/08/2010  . Insomnia 11/08/2010  . Osteoporosis 11/21/2011  . Borderline intellectual functioning 02/15/2012  . Genital herpes 11/05/2012  . Hemorrhoids 11/05/2012  . Chronic periodontal disease 06/30/2013  . Adhesive capsulitis of right shoulder 11/24/2013  . Encounter for gynecological examination with Papanicolaou smear of cervix 12/08/2013  . Prediabetes 12/14/2013  . Postmenopausal atrophic vaginitis 03/17/2014  . Bipolar disorder with depression ([place]) 06/02/2014  . Family history of renal cancer 10/11/2014  . DDD (degenerative disc disease), lumbar 10/11/2014  . Rhinitis, allergic 10/14/2014  . Near syncope 02/09/2015  . Chronic nasal congestion 03/01/2015  . Vitreous floaters of right eye 03/01/2015  . DDD (degenerative disc disease), cervical 07/05/2015  . Cough, persistent 07/21/2015  . Post-nasal drip 07/21/2015  .  Left knee pain 11/09/2015  . Pain in joint, lower leg 11/09/2015  . Plantar fasciitis of left foot 11/09/2015  . Hoarseness 03/09/2016  . Pulmonary nodule 08/08/2016  . Bipolar 1 disorder with moderate mania ([place]) 09/17/2016  . Panic attacks 09/18/2016  . SOB (shortness of breath) 09/18/2016  . Weakness 10/05/2016  . Family history of pancreatic cancer 02/18/2017  . Diarrhea 02/18/2017  . Loss of weight 02/18/2017  . Unintentional weight loss 05/18/2017  . Elevated LDL cholesterol level 05/18/2017  . Hyperlipidemia 05/18/2017  . Irritable bowel syndrome with both constipation and diarrhea 05/18/2017  . History of loop electrical excision procedure (LEEP) 07/01/2017  . Atypical chest pain 10/22/2017  . Anxiety 10/22/2017   Resolved Ambulatory Problems    Diagnosis Date Noted  . Sinusitis 09/27/2010  . Depression 09/27/2010  . Bronchitis 12/04/2010  . Cough 06/02/2011  . General medical examination 10/18/2011  . Dizziness 11/21/2011  . Knee pain 11/21/2011  . Low back pain 11/21/2011  . Heel pain 07/14/2012  . Thoracic myofascial strain 03/13/2013  . Sinusitis 04/20/2013  . Cough 04/22/2013  . Rotator cuff disorder 08/01/2013  . Right shoulder pain 10/27/2013  . Adhesive capsulitis 11/02/2013  . Pain in joint, shoulder region 11/24/2013  . Bipolar disorder ([place]) 04/29/2014  . Right serous otitis media 08/03/2014  . Acute recurrent maxillary sinusitis 10/14/2014  . Right-sided low back pain without sciatica 10/14/2014  . Sinusitis 02/09/2015  . Sacroiliac joint dysfunction of right side 02/25/2015  . Piriformis syndrome 03/01/2015  . Adhesive  capsulitis 07/05/2015  . Radiculitis of right cervical region 07/06/2015  . Drug reaction 09/25/2016   Past Medical History:  Diagnosis Date  . Abnormal Pap smear of cervix   . Allergic rhinitis 11/08/2010  . Anxiety   . Asthma   . Bronchitis   . Herpes simplex of female genitalia   . Hyperlipidemia   . Mitral valve prolapse    . Osteoporosis 11/21/2011  . Urticaria        Review of Systems See HPI.     Objective:   Physical Exam  Constitutional: She is oriented to person, place, and time. She appears well-developed and well-nourished.  HENT:  Head: Normocephalic and atraumatic.  Eyes: Pupils are equal, round, and reactive to light. EOM are normal.  Neck: Normal range of motion. Neck supple. No thyromegaly present.  Cardiovascular: Normal rate and regular rhythm.  Pulmonary/Chest: Effort normal and breath sounds normal. She has no wheezes.  Neurological: She is alert and oriented to person, place, and time.          Assessment & Plan:  [name] KitchenMarland KitchenDiagnoses and all orders for this visit:  Atypical chest pain -     Ambulatory referral to Cardiology  SOB (shortness of breath)  Osteoporosis without current pathological fracture, unspecified osteoporosis type -     alendronate (FOSAMAX) 70 MG tablet; Take 1 tablet (70 mg total) by mouth every 7 (seven) days. Take with a full glass of water on an empty stomach.  Herpes simplex vulvovaginitis -     acyclovir (ZOVIRAX) 200 MG capsule; Take 2 capsules (400 mg total) by mouth 2 (two) times daily.  Anxiety -     ALPRAZolam (XANAX) 0.5 MG tablet; TAKE 1 TABLET ONCE DAILY AS NEEDED FOR ANXIETY.  Panic attacks -     ALPRAZolam (XANAX) 0.5 MG tablet; TAKE 1 TABLET ONCE DAILY AS NEEDED FOR ANXIETY.  Non-seasonal allergic rhinitis due to pollen   Unclear etiology of chest pain. Could be a muscle spasm. Pt is worried and I don't necessarily think she needs stress test. Last EKD was 11/2016 with no acute changes.  Pt request cardiology. Referral placed.   Reassured her SOB was likely due to physical deconditioning. Pulse ox showed great stats the whole 6 minutes walking. Last PFT was 07/2016 and normal. She has chronic allergy symptoms and nasal congestion could be also causing some SOB. Discussed flonase. She has already seen ENT. Consider watching dairy and wheat  since on her intolerance list and see if it makes congestion better.   [name] Kitchen.Spent 30 minutes with patient and greater than 50 percent of visit spent counseling patient regarding treatment plan.

## 2017-10-22 NOTE — Patient Instructions (Signed)
Will make referral for cardiology.

## 2017-10-25 ENCOUNTER — Ambulatory Visit (INDEPENDENT_AMBULATORY_CARE_PROVIDER_SITE_OTHER): Payer: Medicare Other | Admitting: Licensed Clinical Social Worker

## 2017-10-25 ENCOUNTER — Encounter (HOSPITAL_COMMUNITY): Payer: Self-pay | Admitting: Licensed Clinical Social Worker

## 2017-10-25 DIAGNOSIS — F310 Bipolar disorder, current episode hypomanic: Secondary | ICD-10-CM

## 2017-10-25 DIAGNOSIS — F3160 Bipolar disorder, current episode mixed, unspecified: Secondary | ICD-10-CM
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
## 2017-11-01 ENCOUNTER — Ambulatory Visit (HOSPITAL_COMMUNITY): Payer: Self-pay | Admitting: Licensed Clinical Social Worker

## 2017-11-05 ENCOUNTER — Encounter: Payer: Medicare Other | Attending: Physician Assistant | Admitting: Nutrition

## 2017-11-05 ENCOUNTER — Encounter: Payer: Self-pay | Admitting: Nutrition

## 2017-11-05 ENCOUNTER — Encounter: Payer: Self-pay | Admitting: Emergency Medicine

## 2017-11-05 ENCOUNTER — Emergency Department
Admission: EM | Admit: 2017-11-05 | Discharge: 2017-11-05 | Disposition: A | Discharge status: Home / Self Care | Payer: Medicare Other | Source: Home / Self Care | Attending: Family Medicine | Admitting: Family Medicine

## 2017-11-05 ENCOUNTER — Other Ambulatory Visit: Payer: Self-pay

## 2017-11-05 ENCOUNTER — Encounter: Payer: No Typology Code available for payment source | Admitting: Physical Therapy

## 2017-11-05 VITALS — Wt 111.9 lb

## 2017-11-05 DIAGNOSIS — Z713 Dietary counseling and surveillance: Secondary | ICD-10-CM | POA: Insufficient documentation

## 2017-11-05 DIAGNOSIS — J069 Acute upper respiratory infection, unspecified: Secondary | ICD-10-CM

## 2017-11-05 DIAGNOSIS — B9789 Other viral agents as the cause of diseases classified elsewhere: Secondary | ICD-10-CM | POA: Diagnosis not present

## 2017-11-05 DIAGNOSIS — R634 Abnormal weight loss: Secondary | ICD-10-CM | POA: Insufficient documentation

## 2017-11-05 DIAGNOSIS — R636 Underweight: Secondary | ICD-10-CM

## 2017-11-05 MED ORDER — BENZONATATE 100 MG PO CAPS: 100.0000 mg | ORAL_CAPSULE | Freq: Three times a day (TID) | ORAL | 0 refills | Status: DC

## 2017-11-05 MED ORDER — AZITHROMYCIN 250 MG PO TABS: 250.0000 mg | ORAL_TABLET | Freq: Every day | ORAL | 0 refills | Status: DC

## 2017-11-05 NOTE — ED Provider Notes (Signed)
[facility]    CSN: [id number] Arrival date & time: 11/05/17  1051     History   Chief Complaint Chief Complaint  Patient presents with  . Cough    wet cough  . Nasal Congestion    HPI Kellie [name] is a 62 y.o. female.   HPI Kellie [name] is a 62 y.o. female presenting to UC with c/o 7 days of worsening productive cough with green sputum, sinus congestion and scratchy throat.  She has taken Mucinex without relief.  Mild chest soreness and SOB with cough.  She does have a hx of asthma but has not tried her albuterol inhaler because she was not sure if she could take with her mucinex. No hx of pneumonia.  Denies fever, chills, n/v/d.   Denies known sick contacts.    Past Medical History:  Diagnosis Date  . Abnormal Pap smear of cervix   . Allergic rhinitis 11/08/2010  . Anxiety   . Asthma   . Bronchitis   . Herpes simplex of female genitalia   . Hyperlipidemia   . Mitral valve prolapse   . Osteoporosis 11/21/2011  . Urticaria     Patient Active Problem List   Diagnosis Date Noted  . Atypical chest pain 10/22/2017  . Anxiety 10/22/2017  . History of loop electrical excision procedure (LEEP) 07/01/2017  . Unintentional weight loss 05/18/2017  . Elevated LDL cholesterol level 05/18/2017  . Hyperlipidemia 05/18/2017  . Irritable bowel syndrome with both constipation and diarrhea 05/18/2017  . Family history of pancreatic cancer 02/18/2017  . Diarrhea 02/18/2017  . Loss of weight 02/18/2017  . Weakness 10/05/2016  . Panic attacks 09/18/2016  . SOB (shortness of breath) 09/18/2016  . Bipolar 1 disorder with moderate mania ([place]) 09/17/2016  . Pulmonary nodule 08/08/2016  . Hoarseness 03/09/2016  . Left knee pain 11/09/2015  . Pain in joint, lower leg 11/09/2015  . Plantar fasciitis of left foot 11/09/2015  . Cough, persistent 07/21/2015  . Post-nasal drip 07/21/2015  . DDD (degenerative disc disease), cervical 07/05/2015  . Chronic nasal congestion  03/01/2015  . Vitreous floaters of right eye 03/01/2015  . Near syncope 02/09/2015  . Rhinitis, allergic 10/14/2014  . Family history of renal cancer 10/11/2014  . DDD (degenerative disc disease), lumbar 10/11/2014  . Bipolar disorder with depression ([place]) 06/02/2014  . Postmenopausal atrophic vaginitis 03/17/2014  . Prediabetes 12/14/2013  . Encounter for gynecological examination with Papanicolaou smear of cervix 12/08/2013  . Adhesive capsulitis of right shoulder 11/24/2013  . Chronic periodontal disease 06/30/2013  . Genital herpes 11/05/2012  . Hemorrhoids 11/05/2012  . Borderline intellectual functioning 02/15/2012  . Osteoporosis 11/21/2011  . Allergic rhinitis 11/08/2010  . Insomnia 11/08/2010  . GERD (gastroesophageal reflux disease) 09/27/2010    Past Surgical History:  Procedure Laterality Date  . CLOSED MANIPULATION SHOULDER  7&01/2005   x2, 30 days apart  . CLOSED MANIPULATION SHOULDER  2006   Left  . COLONOSCOPY  09/2012   Medium sized external hemorrhoids, few small divertic in left colon, otherwise normal colon (repeat 10 yrs)  . ESOPHAGOGASTRODUODENOSCOPY  09/2012   Normal (Dr. Ardis Hughs)  . LEEP  11/2004    OB History    Gravida  1   Para  1   Term  1   Preterm      AB      Living  1     SAB      TAB      Ectopic  Multiple      Live Births               Home Medications    Prior to Admission medications   Medication Sig Start Date End Date Taking? Authorizing Provider  acyclovir (ZOVIRAX) 200 MG capsule Take 2 capsules (400 mg total) by mouth 2 (two) times daily. 10/22/17   Breeback, Royetta Car, PA-C  alendronate (FOSAMAX) 70 MG tablet Take 1 tablet (70 mg total) by mouth every 7 (seven) days. Take with a full glass of water on an empty stomach. 10/22/17   Breeback, Jade L, PA-C  ALPRAZolam (XANAX) 0.5 MG tablet TAKE 1 TABLET ONCE DAILY AS NEEDED FOR ANXIETY. 10/22/17   Breeback, Jade L, PA-C  azithromycin (ZITHROMAX) 250 MG tablet Take 1  tablet (250 mg total) by mouth daily. Take first 2 tablets together, then 1 every day until finished. 11/05/17   Noe Gens, PA-C  benzonatate (TESSALON) 100 MG capsule Take 1-2 capsules (100-200 mg total) by mouth every 8 (eight) hours. 11/05/17   Noe Gens, PA-C  cetirizine (ZYRTEC) 10 MG tablet Take 10 mg by mouth daily.    [provider]  Cholecalciferol (VITAMIN D) 2000 units CAPS Take by mouth.    [provider]  citalopram (CELEXA) 10 MG tablet TAKE (1) TABLET BY MOUTH ONCE DAILY. 07/22/17   Breeback, Jade L, PA-C  famotidine (PEPCID) 10 MG tablet Take 10 mg by mouth 2 (two) times daily.    [provider]  gabapentin (NEURONTIN) 100 MG capsule Take 1 capsule (100 mg total) by mouth 3 (three) times daily. 05/28/17   Merian Capron, MD  meloxicam (MOBIC) 15 MG tablet One tab PO qAM with breakfast for 2 weeks, then daily prn pain. 08/20/17   Silverio Decamp, MD  mirtazapine (REMERON) 15 MG tablet Take 1 tablet (15 mg total) by mouth at bedtime. 08/20/17   Silverio Decamp, MD  montelukast (SINGULAIR) 10 MG tablet Take 1 tablet (10 mg total) by mouth at bedtime. 09/17/16   Breeback, Royetta Car, PA-C  mupirocin ointment (BACTROBAN) 2 % Place 1 application into the nose 2 (two) times daily. 07/31/16   Breeback, Luvenia Starch L, PA-C  Probiotic Product (PROBIOTIC & ACIDOPHILUS EX ST PO) Take by mouth.    [provider]  Pseudoephedrine-Guaifenesin (MUCINEX D MAX STRENGTH PO) Take by mouth every 12 (twelve) hours as needed.    [provider]    Family History Family History  Problem Relation Age of Onset  . Kidney cancer Mother 31  . COPD Mother   . Cancer Mother        renal  . Kidney disease Mother   . Allergic rhinitis Mother   . Asthma Mother   . Heart disease Father   . Allergic rhinitis Father   . COPD Sister   . Depression Sister   . Pancreatic cancer Sister   . Cancer Sister        pancreatic  . Heart disease Brother   . Heart  attack Brother   . Heart disease Paternal Uncle   . Diabetes Unknown        neice  . Thyroid disease Unknown        neice  . Depression Maternal Aunt   . Depression Maternal Grandfather   . Hypothyroidism Sister   . Hypothyroidism Sister   . Colon cancer Neg Hx   . Rectal cancer Neg Hx   . Stomach cancer Neg Hx   .  Colon polyps Neg Hx     Social History Social History   Tobacco Use  . Smoking status: Never Smoker  . Smokeless tobacco: Never Used  Substance Use Topics  . Alcohol use: No  . Drug use: No     Allergies   Hydrocodone; Abilify [aripiprazole]; Acyclovir and related; Codeine; Morphine and related; Percocet [oxycodone-acetaminophen]; and Vicodin [hydrocodone-acetaminophen]   Review of Systems Review of Systems  Constitutional: Negative for chills and fever.  HENT: Positive for congestion, sinus pressure and sore throat. Negative for ear pain, trouble swallowing and voice change.   Respiratory: Positive for cough. Negative for shortness of breath.   Cardiovascular: Negative for chest pain and palpitations.  Gastrointestinal: Negative for abdominal pain, diarrhea, nausea and vomiting.  Musculoskeletal: Negative for arthralgias, back pain and myalgias.  Skin: Negative for rash.  Neurological: Positive for headaches. Negative for dizziness and light-headedness.     Physical Exam Triage Vital Signs ED Triage Vitals [11/05/17 1118]  Enc Vitals Group     BP 103/69     Pulse Rate 68     Resp      Temp 98.3 F (36.8 C)     Temp Source Oral     SpO2 99 %     Weight 111 lb (50.3 kg)     Height 5\' 5"  (1.651 m)     Head Circumference      Peak Flow      Pain Score 7     Pain Loc      Pain Edu?      Excl. in [place]?    No data found.  Updated Vital Signs BP 103/69 (BP Location: Right Arm)   Pulse 68   Temp 98.3 F (36.8 C) (Oral)   Ht 5\' 5"  (1.651 m)   Wt 111 lb (50.3 kg)   SpO2 99%   BMI 18.47 kg/m   Visual Acuity Right Eye Distance:   Left Eye  Distance:   Bilateral Distance:    Right Eye Near:   Left Eye Near:    Bilateral Near:     Physical Exam  Constitutional: She is oriented to person, place, and time. She appears well-developed and well-nourished. No distress.  HENT:  Head: Normocephalic and atraumatic.  Right Ear: Tympanic membrane normal.  Left Ear: Tympanic membrane normal.  Nose: Mucosal edema present. Right sinus exhibits no maxillary sinus tenderness and no frontal sinus tenderness. Left sinus exhibits no maxillary sinus tenderness and no frontal sinus tenderness.  Mouth/Throat: Uvula is midline, oropharynx is clear and moist and mucous membranes are normal.  Eyes: EOM are normal.  Neck: Normal range of motion. Neck supple.  Cardiovascular: Normal rate and regular rhythm.  Pulmonary/Chest: Effort normal and breath sounds normal. No stridor. No respiratory distress. She has no wheezes. She has no rales.  Musculoskeletal: Normal range of motion.  Lymphadenopathy:    She has no cervical adenopathy.  Neurological: She is alert and oriented to person, place, and time.  Skin: Skin is warm and dry. She is not diaphoretic.  Psychiatric: She has a normal mood and affect. Her behavior is normal.  Nursing note and vitals reviewed.    UC Treatments / Results  Labs (all labs ordered are listed, but only abnormal results are displayed) Labs Reviewed - No data to display  EKG None  Radiology No results found.  Procedures Procedures (including critical care time)  Medications Ordered in UC Medications - No data to display  Initial Impression / Assessment and  Plan / UC Course  I have reviewed the triage vital signs and the nursing notes.  Pertinent labs & imaging results that were available during my care of the patient were reviewed by me and considered in my medical decision making (see chart for details).     No evidence of bacterial infection at this time. Symptoms appear viral. Encouraged symptomatic  treatment. Prescription to hold with expiration date for azithromycin. Pt to fill if persistent fever develops or not improving in 1 week.   Home care instructions provided below.    Final Clinical Impressions(s) / UC Diagnoses   Final diagnoses:  Viral URI with cough     Discharge Instructions      You may take 500mg  acetaminophen every 4-6 hours or in combination with ibuprofen 400-600mg  every 6-8 hours as needed for pain, inflammation, and fever.  You may continue to take the over the counter mucinex with the prescribed Tessalon (benzonatate) for cough.  You may also use your albuterol inhaler with these medications if needed.   Be sure to drink at least eight 8oz glasses of water to stay well hydrated and get at least 8 hours of sleep at night, preferably more while sick.   Your symptoms are likely due to a virus such as the common cold, however, if you developing worsening chest congestion with shortness of breath, persistent fever (>100.4*F) for 3 days, or symptoms not improving in 4-5 days, you may fill the antibiotic (azithromycin).  If you do fill the antibiotic,  please take antibiotics as prescribed and be sure to complete entire course even if you start to feel better to ensure infection does not come back.     ED Prescriptions    Medication Sig Dispense Auth. Provider   benzonatate (TESSALON) 100 MG capsule Take 1-2 capsules (100-200 mg total) by mouth every 8 (eight) hours. 21 capsule Gerarda Fraction, Zsofia Prout O, PA-C   azithromycin (ZITHROMAX) 250 MG tablet Take 1 tablet (250 mg total) by mouth daily. Take first 2 tablets together, then 1 every day until finished. 6 tablet Noe Gens, PA-C     Controlled Substance Prescriptions [place] Controlled Substance Registry consulted? Not Applicable   Tyrell Antonio 11/05/17 1247

## 2017-11-05 NOTE — Progress Notes (Signed)
  Medical Nutrition Therapy:  Appt start time: 1330 end time: 1400   Assessment:  Primary concerns today: Loss of weight. LIves by herself. She notes she is eating a lot with meals and snacks and still can't gain any weight. Wt is about the same as it was last month. Eating three meals per day, 3 snacks per day. Stays active working out in gym.  She notes her stomach is better than it was before. She feels some better overall. Taking a MVI daily. Current diet is meeting her needs. She notes she has had some concerns with her heart and she passed out while going to the bathroom. Scheduled to see cardiologist next few weeks. Has strong family history of CVD and lost her bothers to heart attacks. Her sister are on cholesterol meds. She refused to go on cholestrol meds because she doesn't think it will help and doesn't see that it has her siblings any.   Diet is fairly healthy and doesn't eat a lot saturated fat and processed foods. She is willing to increase portions, add calories and eat more often to gain weight.  Wt Readings from Last 3 Encounters:  11/05/17 111 lb 14.4 oz (50.8 kg)  11/05/17 111 lb (50.3 kg)  10/22/17 111 lb (50.3 kg)   Ht Readings from Last 3 Encounters:  11/05/17 5\' 5"  (1.651 m)  10/22/17 5\' 6"  (1.676 m)  09/09/17 5\' 6"  (1.676 m)   Body mass index is 18.62 kg/m.   Preferred Learning Style:  Visual    No preference indicated    Ready  Change in progress   MEDICATIONS:   DIETARY INTAKE:     24-hr recall:  B ( AM): 1 sausage, 1 slice raisin bread toast and grits. Snk ( AM): usually eats a snack pb  L ( PM):  1 pork chop, carrots, tomatoes, peppers, 2 mini potaotes with butter,  Salad  And green beans,  Gingerale, water  Snk ( PM): blueberries and 2 pecan swirls  D ( PM): Spaghetti with meat sauce,  Snk ( PM):   Beverages: 3 -16 oz of bottles water  Usual physical activity:  [employer] once a week.  Estimated energy needs: 1800-2000   calories 200  g carbohydrates 135 g protein 50 g fat  Progress Towards Goal(s):  In progress.   Nutritional Diagnosis:  NI-1.4 Inadequate energy intake As related to Being underweight.  As evidenced by BMI 17    Intervention: HIgh Calorie High Protein Diet.  Goals 1. Keep eating three meal and 3 healthy snacks. 2. Continue drink water 3. Stay active 4. Add calories with healthy fats. Keep appt with cardiologist. Gain 2 lbs per month. Call if you have questions.  Teaching Method Utilized:  Visual Auditory Hands on  Handouts given during visit include:  HIgh Calorie High Protein Diet   Barriers to learning/adherence to lifestyle change: Bipolar  Demonstrated degree of understanding via:  Teach Back   Monitoring/Evaluation:  Dietary intake, exercise, meal planning, and body weight in PRN.[name] Kitchen

## 2017-11-05 NOTE — Patient Instructions (Signed)
Goals 1. Keep eating three meal and 3 healthy snacks. 2. Continue drink water 3. Stay active 4. Add calories with healthy fats. Keep appt with cardiologist. Gain 2 lbs per month. Call if you have questions.

## 2017-11-05 NOTE — Discharge Instructions (Signed)
°  You may take 500mg  acetaminophen every 4-6 hours or in combination with ibuprofen 400-600mg  every 6-8 hours as needed for pain, inflammation, and fever.  You may continue to take the over the counter mucinex with the prescribed Tessalon (benzonatate) for cough.  You may also use your albuterol inhaler with these medications if needed.   Be sure to drink at least eight 8oz glasses of water to stay well hydrated and get at least 8 hours of sleep at night, preferably more while sick.   Your symptoms are likely due to a virus such as the common cold, however, if you developing worsening chest congestion with shortness of breath, persistent fever (>100.4*F) for 3 days, or symptoms not improving in 4-5 days, you may fill the antibiotic (azithromycin).  If you do fill the antibiotic,  please take antibiotics as prescribed and be sure to complete entire course even if you start to feel better to ensure infection does not come back.

## 2017-11-05 NOTE — ED Triage Notes (Signed)
62 y.o female presents today c/o cougha and congestion for 7 days. States she is taking Mucinex with no relief.

## 2017-11-06 ENCOUNTER — Other Ambulatory Visit (HOSPITAL_COMMUNITY): Payer: Self-pay

## 2017-11-06 MED ORDER — GABAPENTIN 100 MG PO CAPS: 100.0000 mg | ORAL_CAPSULE | Freq: Three times a day (TID) | ORAL | 0 refills | Status: DC

## 2017-11-08 ENCOUNTER — Encounter (HOSPITAL_COMMUNITY): Payer: Self-pay | Admitting: Licensed Clinical Social Worker

## 2017-11-08 ENCOUNTER — Ambulatory Visit (INDEPENDENT_AMBULATORY_CARE_PROVIDER_SITE_OTHER): Payer: Medicare Other | Admitting: Licensed Clinical Social Worker

## 2017-11-08 DIAGNOSIS — F310 Bipolar disorder, current episode hypomanic: Secondary | ICD-10-CM
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
## 2017-11-13 ENCOUNTER — Ambulatory Visit (INDEPENDENT_AMBULATORY_CARE_PROVIDER_SITE_OTHER): Payer: Medicare Other | Admitting: Psychiatry

## 2017-11-13 ENCOUNTER — Encounter (HOSPITAL_COMMUNITY): Payer: Self-pay | Admitting: Psychiatry

## 2017-11-13 ENCOUNTER — Telehealth: Payer: Self-pay

## 2017-11-13 ENCOUNTER — Other Ambulatory Visit: Payer: Self-pay

## 2017-11-13 VITALS — BP 102/62 | HR 77 | Ht 66.0 in | Wt 110.0 lb

## 2017-11-13 DIAGNOSIS — F310 Bipolar disorder, current episode hypomanic: Secondary | ICD-10-CM

## 2017-11-13 DIAGNOSIS — Z818 Family history of other mental and behavioral disorders: Secondary | ICD-10-CM

## 2017-11-13 DIAGNOSIS — F5102 Adjustment insomnia: Secondary | ICD-10-CM | POA: Diagnosis not present

## 2017-11-13 DIAGNOSIS — F411 Generalized anxiety disorder: Secondary | ICD-10-CM

## 2017-11-13 MED ORDER — GABAPENTIN 100 MG PO CAPS: 100.0000 mg | ORAL_CAPSULE | Freq: Three times a day (TID) | ORAL | 2 refills | Status: DC

## 2017-11-13 NOTE — Telephone Encounter (Signed)
Does she even need this at 29??

## 2017-11-13 NOTE — Progress Notes (Signed)
Patient ID: Kellie [name], female   DOB: [birth year], 62 y.o.   MRN: [id number]   [place] Follow up Outpatient visit  Kellie [name] [id number] 62 y.o.  11/13/2017 9:02 AM  Chief Complaint:   Follow up for Bipolar  And anxiety.  History of Present Illness:   Patient  Initially presented with racing toughts and cant sleep at night. Gabapentin is keeping her mood and some balance in depression Concern of weight loss. Is on remeron as well. Working with primary care to rule out any bone condition Goes to gym to tone  Also on celexa.  Anxiety more during evening Worried about age , finances and weight loss     Past Psychiatric History/Hospitalization(s) denies  Hospitalization for psychiatric illness: No History of Electroconvulsive Shock Therapy: No Prior Suicide Attempts: No  Medical History; Past Medical History:  Diagnosis Date  . Abnormal Pap smear of cervix   . Allergic rhinitis 11/08/2010  . Anxiety   . Asthma   . Bronchitis   . Herpes simplex of female genitalia   . Hyperlipidemia   . Mitral valve prolapse   . Osteoporosis 11/21/2011  . Urticaria     Allergies: Allergies  Allergen Reactions  . Hydrocodone Nausea And Vomiting  . Abilify [Aripiprazole]     Nausea/vomiting/weakness/shaky  . Acyclovir And Related   . Codeine Nausea And Vomiting  . Morphine And Related Nausea And Vomiting  . Percocet [Oxycodone-Acetaminophen] Nausea And Vomiting and Other (See Comments)    Patient states it makes blood pressure drop too much  . Vicodin [Hydrocodone-Acetaminophen] Nausea And Vomiting    Medications: Outpatient Encounter Medications as of 11/13/2017  Medication Sig  . acyclovir (ZOVIRAX) 200 MG capsule Take 2 capsules (400 mg total) by mouth 2 (two) times daily.  [name] Kitchen alendronate (FOSAMAX) 70 MG tablet Take 1 tablet (70 mg total) by mouth every 7 (seven) days. Take with a full glass of water on an empty stomach.  . ALPRAZolam (XANAX) 0.5 MG  tablet TAKE 1 TABLET ONCE DAILY AS NEEDED FOR ANXIETY.  [name] Kitchen azithromycin (ZITHROMAX) 250 MG tablet Take 1 tablet (250 mg total) by mouth daily. Take first 2 tablets together, then 1 every day until finished.  . benzonatate (TESSALON) 100 MG capsule Take 1-2 capsules (100-200 mg total) by mouth every 8 (eight) hours.  . cetirizine (ZYRTEC) 10 MG tablet Take 10 mg by mouth daily.  . Cholecalciferol (VITAMIN D) 2000 units CAPS Take by mouth.  . citalopram (CELEXA) 10 MG tablet TAKE (1) TABLET BY MOUTH ONCE DAILY.  . famotidine (PEPCID) 10 MG tablet Take 10 mg by mouth 2 (two) times daily.  [name] Kitchen gabapentin (NEURONTIN) 100 MG capsule Take 1 capsule (100 mg total) by mouth 3 (three) times daily.  . meloxicam (MOBIC) 15 MG tablet One tab PO qAM with breakfast for 2 weeks, then daily prn pain.  . mirtazapine (REMERON) 15 MG tablet Take 1 tablet (15 mg total) by mouth at bedtime.  . montelukast (SINGULAIR) 10 MG tablet Take 1 tablet (10 mg total) by mouth at bedtime.  . mupirocin ointment (BACTROBAN) 2 % Place 1 application into the nose 2 (two) times daily.  . Probiotic Product (PROBIOTIC & ACIDOPHILUS EX ST PO) Take by mouth.  . Pseudoephedrine-Guaifenesin (MUCINEX D MAX STRENGTH PO) Take by mouth every 12 (twelve) hours as needed.  . [DISCONTINUED] gabapentin (NEURONTIN) 100 MG capsule Take 1 capsule (100 mg total) by mouth 3 (three) times daily.   No facility-administered encounter medications  on file as of 11/13/2017.      Family History; Family History  Problem Relation Age of Onset  . Kidney cancer Mother 83  . COPD Mother   . Cancer Mother        renal  . Kidney disease Mother   . Allergic rhinitis Mother   . Asthma Mother   . Heart disease Father   . Allergic rhinitis Father   . COPD Sister   . Depression Sister   . Pancreatic cancer Sister   . Cancer Sister        pancreatic  . Heart disease Brother   . Heart attack Brother   . Heart disease Paternal Uncle   . Diabetes Unknown         neice  . Thyroid disease Unknown        neice  . Depression Maternal Aunt   . Depression Maternal Grandfather   . Hypothyroidism Sister   . Hypothyroidism Sister   . Colon cancer Neg Hx   . Rectal cancer Neg Hx   . Stomach cancer Neg Hx   . Colon polyps Neg Hx        Labs:  No results found for this or any previous visit (from the past 2160 hour(s)).   Mental Status Examination;   Psychiatric Specialty Exam: Physical Exam  Constitutional: She appears well-developed and well-nourished.  Skin: She is not diaphoretic.    Review of Systems  Constitutional: Negative for fever.  Cardiovascular: Negative for palpitations.  Skin: Negative for rash.  Neurological: Negative for tremors and headaches.  Psychiatric/Behavioral: Negative for depression and suicidal ideas.    Blood pressure 102/62, pulse 77, height 5\' 6"  (1.676 m), weight 110 lb (49.9 kg).Body mass index is 17.75 kg/m.  General Appearance: Casual  Eye Contact::  Fair  Speech:  coherent  Volume:  Normal  Mood: fair  Affect: reactive  Thought Process: clear . No psychosis  Orientation:  Full (Time, Place, and Person)  Thought Content:  Rumination  Suicidal Thoughts:  No  Homicidal Thoughts:  No  Memory:  Immediate;   Fair Recent;   Fair  Judgement:  Fair  Insight:  Shallow  Psychomotor Activity:  Increased  Concentration:  Fair  Recall:  Fair  Akathisia:  Negative  Handed:  Right  AIMS (if indicated):     Assets:  Desire for Improvement Physical Health Transportation  Sleep:        Assessment: Axis I: bipolar disorder II unspecified or depressed type. Anxiety disorder NOS. Insomnia  Axis II: deferred  Axis III:  Past Medical History:  Diagnosis Date  . Abnormal Pap smear of cervix   . Allergic rhinitis 11/08/2010  . Anxiety   . Asthma   . Bronchitis   . Herpes simplex of female genitalia   . Hyperlipidemia   . Mitral valve prolapse   . Osteoporosis 11/21/2011  . Urticaria     Axis  IV: psychosocial   Treatment Plan and Summary:  Bipolar : less agitated. Concern of weight. Provided suppprotive therapy. Continue gabapentin .work with primary care for cause  GAD: not worse. Continue xanax prn and celexa from primary care  Insomnia: fair. Not worse. Reviewed sleep hygiene Reviewed meds   Questions addressed FU 4-5 months.  Merian Capron, MD 11/13/2017

## 2017-11-13 NOTE — Telephone Encounter (Signed)
PT needs a referral for a bone density test. Please let her know when it's ready.

## 2017-11-13 NOTE — Telephone Encounter (Signed)
Pt had one 12/12/2016 not due until 12/13/2018.

## 2017-11-15 ENCOUNTER — Ambulatory Visit (HOSPITAL_COMMUNITY): Payer: Self-pay | Admitting: Licensed Clinical Social Worker

## 2017-11-22 ENCOUNTER — Ambulatory Visit (INDEPENDENT_AMBULATORY_CARE_PROVIDER_SITE_OTHER): Payer: Medicare Other | Admitting: Licensed Clinical Social Worker

## 2017-11-22 ENCOUNTER — Encounter (HOSPITAL_COMMUNITY): Payer: Self-pay | Admitting: Licensed Clinical Social Worker

## 2017-11-22 DIAGNOSIS — F310 Bipolar disorder, current episode hypomanic: Secondary | ICD-10-CM

## 2017-11-22 DIAGNOSIS — F3160 Bipolar disorder, current episode mixed, unspecified: Secondary | ICD-10-CM
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
## 2017-11-26 ENCOUNTER — Other Ambulatory Visit: Payer: Self-pay | Admitting: Sports Medicine

## 2017-11-26 DIAGNOSIS — M5030 Other cervical disc degeneration, unspecified cervical region: Secondary | ICD-10-CM

## 2017-11-27 ENCOUNTER — Encounter: Payer: Self-pay | Admitting: Physical Therapy

## 2017-11-27 ENCOUNTER — Other Ambulatory Visit: Payer: Self-pay

## 2017-11-27 ENCOUNTER — Ambulatory Visit: Payer: Medicare Other | Attending: Sports Medicine | Admitting: Physical Therapy

## 2017-11-27 DIAGNOSIS — R293 Abnormal posture: Secondary | ICD-10-CM | POA: Insufficient documentation

## 2017-11-27 DIAGNOSIS — M6281 Muscle weakness (generalized): Secondary | ICD-10-CM | POA: Diagnosis present

## 2017-11-27 DIAGNOSIS — M79602 Pain in left arm: Secondary | ICD-10-CM | POA: Diagnosis present

## 2017-11-27 DIAGNOSIS — M545 Low back pain: Secondary | ICD-10-CM | POA: Insufficient documentation

## 2017-11-27 DIAGNOSIS — M542 Cervicalgia: Secondary | ICD-10-CM | POA: Insufficient documentation

## 2017-11-27 DIAGNOSIS — G8929 Other chronic pain: Secondary | ICD-10-CM | POA: Insufficient documentation

## 2017-11-27 DIAGNOSIS — M79601 Pain in right arm: Secondary | ICD-10-CM | POA: Insufficient documentation

## 2017-11-27 NOTE — Therapy (Signed)
[name] [address] Kellie [name] [place], [place], [address] Phone: [phone]   Fax:  [phone]  Physical Therapy Evaluation  Patient Details  Name: Kellie [name] MRN: [id number] Date of Birth: March 28, 1956 Referring Provider: Silverio Decamp, MD   Encounter Date: 11/27/2017  PT End of Session - 11/27/17 1007    Visit Number  1    Date for PT Re-Evaluation  01/22/18    Authorization Type  medicaid    PT Start Time  1007    PT Stop Time  1050    PT Time Calculation (min)  43 min    Activity Tolerance  Patient tolerated treatment well       Past Medical History:  Diagnosis Date  . Abnormal Pap smear of cervix   . Allergic rhinitis 11/08/2010  . Anxiety   . Asthma   . Bronchitis   . Herpes simplex of female genitalia   . Hyperlipidemia   . Mitral valve prolapse   . Osteoporosis 11/21/2011  . Urticaria     Past Surgical History:  Procedure Laterality Date  . CLOSED MANIPULATION SHOULDER  7&01/2005   x2, 30 days apart  . CLOSED MANIPULATION SHOULDER  2006   Left  . COLONOSCOPY  09/2012   Medium sized external hemorrhoids, few small divertic in left colon, otherwise normal colon (repeat 10 yrs)  . ESOPHAGOGASTRODUODENOSCOPY  09/2012   Normal (Dr. Ardis Hughs)  . LEEP  11/2004    There were no vitals filed for this visit.   Subjective Assessment - 11/27/17 1010    Subjective  Pt states she is having more pain in her back today.  States she washed hair in the sink and "forget it".  Haven't had neck pain since last Saturday and was tense on left side    Limitations  Standing;Lifting    How long can you stand comfortably?  over an hour    Currently in Pain?  Yes    Pain Score  8     Pain Location  Back    Pain Orientation  Right;Mid;Left    Pain Descriptors / Indicators  Aching;Tingling;Constant    Pain Type  Chronic pain    Pain Onset  More than a month ago over a year    Pain Frequency  Intermittent    Aggravating Factors   standing, bending forward    Multiple Pain Sites  Yes         [facility] PT Assessment - 11/27/17 0001      Assessment   Medical Diagnosis  M50.30 (ICD-10-CM) - DDD (degenerative disc disease), cervical    Referring Provider  Silverio Decamp, MD    Onset Date/Surgical Date  03/06/17    Prior Therapy  Yes      Precautions   Precautions  None      Restrictions   Weight Bearing Restrictions  No      Balance Screen   Has the patient fallen in the past 6 months  Yes    How many times?  1 feel dizzy over the last year    Has the patient had a decrease in activity level because of a fear of falling?   Yes    Is the patient reluctant to leave their home because of a fear of falling?   Yes      [place] residence    Living Arrangements  Alone      Prior  Function   Level of Independence  Independent      Cognition   Overall Cognitive Status  Within Functional Limits for tasks assessed      Posture/Postural Control   Posture/Postural Control  Postural limitations    Postural Limitations  Rounded Shoulders;Forward head;Increased thoracic kyphosis;Decreased lumbar lordosis c-curve in sitting      AROM   AROM Assessment Site  Cervical;Lumbar;Shoulder    Right/Left Shoulder  -- WFL    Cervical - Right Side Bend  35    Cervical - Left Side Bend  35    Cervical - Right Rotation  55    Cervical - Left Rotation  35    Lumbar Flexion  50% limited    Lumbar Extension  50% limited    Lumbar - Right Side Bend  WFL painful    Lumbar - Left Side Bend  WFL painful    Lumbar - Right Rotation  WFL    Lumbar - Left Rotation  [facility]      Flexibility   Soft Tissue Assessment /Muscle Length  yes    Hamstrings  right 30% limited; left 25% limited      Palpation   Palpation comment  tender and tight at lumbar paraspinals, cervical paraspinals, right suboccipitals, right SCM      Ambulation/Gait   Gait Pattern  Within Functional  Limits                Objective measurements completed on examination: See above findings.      [name] PT Treatment/Exercise - 11/27/17 0001      Self-Care   Other Self-Care Comments   posture             PT Education - 11/27/17 1059    Education Details   Access Code: [email or web address]     Person(s) Educated  Patient    Methods  Explanation;Demonstration;Handout    Comprehension  Verbalized understanding;Returned demonstration       PT Short Term Goals - 11/27/17 1115      PT SHORT TERM GOAL #1   Title  ind with initial HEP    Time  4    Period  Weeks    Status  New    Target Date  12/25/17      PT SHORT TERM GOAL #2   Title  reports 25% reduction of pain during typical day    Time  4    Period  Weeks    Status  New    Target Date  12/25/17        PT Long Term Goals - 11/27/17 1103      PT LONG TERM GOAL #1   Title  I with HEP    Baseline  does not know; has not learned    Time  8    Period  Weeks    Status  New    Target Date  01/22/18      PT LONG TERM GOAL #2   Title  report =/> 50% reduction in overall body pain to allow her to return to exercise at the gym    Baseline  8/10 pain    Time  8    Period  Weeks    Status  New    Target Date  01/22/18      PT LONG TERM GOAL #3   Title  improve bilateral LE strength to at least 4+/5 MMT with minimal pain    Baseline  hip extension,  flexion, abd, add are 4-/5 with some pain    Time  8    Period  Weeks    Status  New    Target Date  01/22/18      PT LONG TERM GOAL #4   Title  improve cervical rotation =/> 60 degrees bilat    Baseline  Rt rotation 55 degrees; Lt rotation 35 degrees    Time  8    Status  New    Target Date  01/22/18      PT LONG TERM GOAL #5   Title  demonstrate ability to maintain upright posture throughout full treatment session due to improved strength and body awareness    Baseline  needs education and cues for sitting upright    Time  8    Period  Weeks     Status  New    Target Date  01/22/18             Plan - 11/27/17 1116    Clinical Impression Statement  Pt presents to skilled PT due to chronic pain in back and neck that has been ongoing for a year. She has been to PT and has since had flare up of her pain.  Pt has posture abnormalities . Pt has decreased cervical and lumbar ROM as stated above.  Pt has muscle tightness lumbar and cervical regions as mentioned above.  Pt has weakness noted throughout bilateral LE.  Pt will benefit from skillled PT in order to address impairments and manage pain so she can return to maximum function.    History and Personal Factors relevant to plan of care:  chronic pain, DDD    Clinical Presentation  Unstable    Clinical Presentation due to:  pain is unstable and unpredictable    Clinical Decision Making  Low    Rehab Potential  Good    PT Frequency  1x / week    PT Duration  8 weeks    PT Treatment/Interventions  Iontophoresis 4mg /ml Dexamethasone;Manual techniques;Moist Heat;Patient/family education;Taping;Therapeutic exercise;Cryotherapy;Electrical Stimulation;ADLs/Self Care Home Management;Therapeutic activities;Neuromuscular re-education;Passive range of motion;Dry needling    PT Next Visit Plan  modalities and STM for pain management, posture and core strength progression    PT Home Exercise Plan   Access Code: [email or web address]     Consulted and Agree with Plan of Care  Patient       Patient will benefit from skilled therapeutic intervention in order to improve the following deficits and impairments:  Pain, Postural dysfunction, Increased muscle spasms, Decreased strength  Visit Diagnosis: Abnormal posture - Plan: PT plan of care cert/re-cert  Muscle weakness (generalized) - Plan: PT plan of care cert/re-cert  Chronic bilateral low back pain, with sciatica presence unspecified - Plan: PT plan of care cert/re-cert  Cervicalgia - Plan: PT plan of care cert/re-cert     Problem List Patient  Active Problem List   Diagnosis Date Noted  . Atypical chest pain 10/22/2017  . Anxiety 10/22/2017  . History of loop electrical excision procedure (LEEP) 07/01/2017  . Unintentional weight loss 05/18/2017  . Elevated LDL cholesterol level 05/18/2017  . Hyperlipidemia 05/18/2017  . Irritable bowel syndrome with both constipation and diarrhea 05/18/2017  . Family history of pancreatic cancer 02/18/2017  . Diarrhea 02/18/2017  . Loss of weight 02/18/2017  . Weakness 10/05/2016  . Panic attacks 09/18/2016  . SOB (shortness of breath) 09/18/2016  . Bipolar 1 disorder with moderate mania ([place]) 09/17/2016  . Pulmonary nodule 08/08/2016  .  Hoarseness 03/09/2016  . Left knee pain 11/09/2015  . Pain in joint, lower leg 11/09/2015  . Plantar fasciitis of left foot 11/09/2015  . Cough, persistent 07/21/2015  . Post-nasal drip 07/21/2015  . DDD (degenerative disc disease), cervical 07/05/2015  . Chronic nasal congestion 03/01/2015  . Vitreous floaters of right eye 03/01/2015  . Near syncope 02/09/2015  . Rhinitis, allergic 10/14/2014  . Family history of renal cancer 10/11/2014  . DDD (degenerative disc disease), lumbar 10/11/2014  . Bipolar disorder with depression ([place]) 06/02/2014  . Postmenopausal atrophic vaginitis 03/17/2014  . Prediabetes 12/14/2013  . Encounter for gynecological examination with Papanicolaou smear of cervix 12/08/2013  . Adhesive capsulitis of right shoulder 11/24/2013  . Chronic periodontal disease 06/30/2013  . Genital herpes 11/05/2012  . Hemorrhoids 11/05/2012  . Borderline intellectual functioning 02/15/2012  . Osteoporosis 11/21/2011  . Allergic rhinitis 11/08/2010  . Insomnia 11/08/2010  . GERD (gastroesophageal reflux disease) 09/27/2010    Zannie Cove, PT 11/27/2017, 11:37 AM  [name] [address], [place], [place], [address] Phone: [phone]   Fax:  [phone]  Name: ABREA HENLE MRN: [id number] Date of Birth: [birth year]

## 2017-11-27 NOTE — Patient Instructions (Signed)
Access Code: [email or web address]  URL: [email or web address]  Date: 11/27/2017  Prepared by: Lovett Calender   Patient Education  Office Posture  Sway Back Posture

## 2017-12-09 ENCOUNTER — Ambulatory Visit: Payer: Medicare Other | Admitting: Physical Therapy

## 2017-12-09 ENCOUNTER — Encounter: Payer: Self-pay | Admitting: Physical Therapy

## 2017-12-09 DIAGNOSIS — M545 Low back pain: Secondary | ICD-10-CM | POA: Diagnosis not present

## 2017-12-09 DIAGNOSIS — G8929 Other chronic pain: Secondary | ICD-10-CM

## 2017-12-09 DIAGNOSIS — M6281 Muscle weakness (generalized): Secondary | ICD-10-CM | POA: Diagnosis not present

## 2017-12-09 DIAGNOSIS — M542 Cervicalgia: Secondary | ICD-10-CM

## 2017-12-09 DIAGNOSIS — R293 Abnormal posture: Secondary | ICD-10-CM

## 2017-12-09 DIAGNOSIS — M79601 Pain in right arm: Secondary | ICD-10-CM | POA: Diagnosis not present

## 2017-12-09 NOTE — Patient Instructions (Signed)
Access Code: [email or web address]  URL: [email or web address]  Date: 12/09/2017  Prepared by: Elly Modena   Exercises  Clamshell with Resistance - 10-15 reps - 2 sets - 2x daily - 7x weekly  Hooklying Isometric Hip Flexion - 10 reps - 3 sets - 1x daily - 7x weekly  Bridge with Resistance - 10 reps - 3 sets - 1x daily - 7x weekly  Squat with Resistance at Thighs - 10 reps - 3 sets - 1x daily - 7x weekly  Patient Education  Office Posture  Sway Back Posture   [facility] [address], [place], [place] [address] Phone # [phone] Fax [phone]

## 2017-12-09 NOTE — Therapy (Signed)
[facility] [address] [place], [place], [address] Phone: [phone]   Fax:  [phone]  Physical Therapy Treatment  Patient Details  Name: Kellie [name] MRN: [id number] Date of Birth: [birth year] Referring Provider: Silverio Decamp, MD   Encounter Date: 12/09/2017  PT End of Session - 12/09/17 0848    Visit Number  2    Date for PT Re-Evaluation  01/22/18    Authorization Type  medicaid    PT Start Time  0802    PT Stop Time  2025    PT Time Calculation (min)  45 min    Activity Tolerance  Patient tolerated treatment well;No increased pain    Behavior During Therapy  WFL for tasks assessed/performed       Past Medical History:  Diagnosis Date  . Abnormal Pap smear of cervix   . Allergic rhinitis 11/08/2010  . Anxiety   . Asthma   . Bronchitis   . Herpes simplex of female genitalia   . Hyperlipidemia   . Mitral valve prolapse   . Osteoporosis 11/21/2011  . Urticaria     Past Surgical History:  Procedure Laterality Date  . CLOSED MANIPULATION SHOULDER  7&01/2005   x2, 30 days apart  . CLOSED MANIPULATION SHOULDER  2006   Left  . COLONOSCOPY  09/2012   Medium sized external hemorrhoids, few small divertic in left colon, otherwise normal colon (repeat 10 yrs)  . ESOPHAGOGASTRODUODENOSCOPY  09/2012   Normal (Dr. Ardis Hughs)  . LEEP  11/2004    There were no vitals filed for this visit.  Subjective Assessment - 12/09/17 0805    Subjective  Pt reports that her main concern is her low back. She thinks that what they were doing at therapy 2 months ago was really helping. She has not been very active the past 1-2 weeks.    Limitations  Standing;Lifting    How long can you stand comfortably?  over an hour    Currently in Pain?  Yes    Pain Score  -- 12, no visual signs of distress    Pain Location  Back    Pain Orientation  Right;Left;Mid    Pain Descriptors / Indicators  Aching;Tightness    Pain  Onset  More than a month ago over a year    Pain Frequency  Intermittent    Aggravating Factors   standing for a long time; alot of house work    Pain Relieving Factors  laying on side with pillow between knees     Multiple Pain Sites  No                       [facility] Adult PT Treatment/Exercise - 12/09/17 0001      Self-Care   Other Self-Care Comments   use of lumbar roll, set up and importance of its use throughout the day       Exercises   Exercises  Lumbar      Lumbar Exercises: Aerobic   UBE (Upper Arm Bike)  L1 x2 min forward/backward while sitting on red physioball, therapist cuing for upright posture and abdominal bracing       Lumbar Exercises: Standing   Other Standing Lumbar Exercises  BLE squat with UE support on countertop and red TB around knees, x10 reps (therapist cuing to decrease knee valgus deviation)      Lumbar Exercises: Supine   Clam  20 reps  Other Supine Lumbar Exercises  bridge with ball squeeze x15 reps, bridge with blue TB around knees x10 reps     Other Supine Lumbar Exercises  hooklying chin tuck with horizontal abduction x10 reps with red TB              PT Education - 12/09/17 0829    Education Details  updated HEP [facility]); importance of HEP adherence regularly in decreasing pain and preventing fearful behavior for movement; use of lumbar roll while sitting to improve posture     Person(s) Educated  Patient    Methods  Explanation;Handout;Demonstration    Comprehension  Verbalized understanding;Returned demonstration       PT Short Term Goals - 12/09/17 0853      PT SHORT TERM GOAL #1   Title  ind with initial HEP    Time  4    Period  Weeks    Status  On-going      PT SHORT TERM GOAL #2   Title  reports 25% reduction of pain during typical day    Time  4    Period  Weeks    Status  On-going        PT Long Term Goals - 11/27/17 1103      PT LONG TERM GOAL #1   Title  I with HEP    Baseline  does not  know; has not learned    Time  8    Period  Weeks    Status  New    Target Date  01/22/18      PT LONG TERM GOAL #2   Title  report =/> 50% reduction in overall body pain to allow her to return to exercise at the gym    Baseline  8/10 pain    Time  8    Period  Weeks    Status  New    Target Date  01/22/18      PT LONG TERM GOAL #3   Title  improve bilateral LE strength to at least 4+/5 MMT with minimal pain    Baseline  hip extension, flexion, abd, add are 4-/5 with some pain    Time  8    Period  Weeks    Status  New    Target Date  01/22/18      PT LONG TERM GOAL #4   Title  improve cervical rotation =/> 60 degrees bilat    Baseline  Rt rotation 55 degrees; Lt rotation 35 degrees    Time  8    Status  New    Target Date  01/22/18      PT LONG TERM GOAL #5   Title  demonstrate ability to maintain upright posture throughout full treatment session due to improved strength and body awareness    Baseline  needs education and cues for sitting upright    Time  8    Period  Weeks    Status  New    Target Date  01/22/18            Plan - 12/09/17 0848    Clinical Impression Statement  Pt presented today with 12 out of 10 pain in her low back. Completed therex to promote pain free movement and increase LE strength. Pt demonstrated no signs of physical distress throughout the session. Updates were made to her HEP and she was instucted in set up and use of a lumbar roll for additional support while sitting. Pt  reported improvements in her pain end of session to "less than 10/10". Will continue to encourage activity participation and decrease pain.     Rehab Potential  Good    PT Frequency  1x / week    PT Duration  8 weeks    PT Treatment/Interventions  Iontophoresis 4mg /ml Dexamethasone;Manual techniques;Moist Heat;Patient/family education;Taping;Therapeutic exercise;Cryotherapy;Electrical Stimulation;ADLs/Self Care Home Management;Therapeutic activities;Neuromuscular  re-education;Passive range of motion;Dry needling    PT Next Visit Plan  f/u on lumbar pillow; focus on movement of the lumbar spine, strengthening of core and hips (supine abdominal/UE work and sidelying hip strength); modalities last resort with focus on encouraging pain free movement and activity participation    PT Home Exercise Plan   Access Code: [email or web address]     Consulted and Agree with Plan of Care  Patient       Patient will benefit from skilled therapeutic intervention in order to improve the following deficits and impairments:  Pain, Postural dysfunction, Increased muscle spasms, Decreased strength  Visit Diagnosis: Abnormal posture  Muscle weakness (generalized)  Chronic bilateral low back pain, with sciatica presence unspecified  Cervicalgia     Problem List Patient Active Problem List   Diagnosis Date Noted  . Atypical chest pain 10/22/2017  . Anxiety 10/22/2017  . History of loop electrical excision procedure (LEEP) 07/01/2017  . Unintentional weight loss 05/18/2017  . Elevated LDL cholesterol level 05/18/2017  . Hyperlipidemia 05/18/2017  . Irritable bowel syndrome with both constipation and diarrhea 05/18/2017  . Family history of pancreatic cancer 02/18/2017  . Diarrhea 02/18/2017  . Loss of weight 02/18/2017  . Weakness 10/05/2016  . Panic attacks 09/18/2016  . SOB (shortness of breath) 09/18/2016  . Bipolar 1 disorder with moderate mania ([place]) 09/17/2016  . Pulmonary nodule 08/08/2016  . Hoarseness 03/09/2016  . Left knee pain 11/09/2015  . Pain in joint, lower leg 11/09/2015  . Plantar fasciitis of left foot 11/09/2015  . Cough, persistent 07/21/2015  . Post-nasal drip 07/21/2015  . DDD (degenerative disc disease), cervical 07/05/2015  . Chronic nasal congestion 03/01/2015  . Vitreous floaters of right eye 03/01/2015  . Near syncope 02/09/2015  . Rhinitis, allergic 10/14/2014  . Family history of renal cancer 10/11/2014  . DDD (degenerative disc  disease), lumbar 10/11/2014  . Bipolar disorder with depression ([place]) 06/02/2014  . Postmenopausal atrophic vaginitis 03/17/2014  . Prediabetes 12/14/2013  . Encounter for gynecological examination with Papanicolaou smear of cervix 12/08/2013  . Adhesive capsulitis of right shoulder 11/24/2013  . Chronic periodontal disease 06/30/2013  . Genital herpes 11/05/2012  . Hemorrhoids 11/05/2012  . Borderline intellectual functioning 02/15/2012  . Osteoporosis 11/21/2011  . Allergic rhinitis 11/08/2010  . Insomnia 11/08/2010  . GERD (gastroesophageal reflux disease) 09/27/2010   8:57 AM,12/09/17 Sherol Dade PT, DPT [facility] [address] [place], [place], [address] Phone: [phone]   Fax:  [phone]  Name: Kellie [name] MRN: [id number] Date of Birth: 03-04-1956
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
## 2017-12-11 ENCOUNTER — Encounter

## 2017-12-11 ENCOUNTER — Encounter: Payer: Self-pay | Admitting: Cardiology

## 2017-12-11 ENCOUNTER — Ambulatory Visit (INDEPENDENT_AMBULATORY_CARE_PROVIDER_SITE_OTHER): Payer: Medicare Other | Admitting: Cardiology

## 2017-12-11 VITALS — BP 106/58 | HR 82 | Ht 66.0 in | Wt 112.0 lb

## 2017-12-11 DIAGNOSIS — R072 Precordial pain: Secondary | ICD-10-CM

## 2017-12-11 DIAGNOSIS — R0600 Dyspnea, unspecified: Secondary | ICD-10-CM

## 2017-12-11 DIAGNOSIS — I341 Nonrheumatic mitral (valve) prolapse: Secondary | ICD-10-CM | POA: Diagnosis not present

## 2017-12-11 NOTE — Patient Instructions (Signed)
Medication Instructions:   No change  Testing/Procedures:  Your physician has requested that you have an echocardiogram. Echocardiography is a painless test that uses sound waves to create images of your heart. It provides your doctor with information about the size and shape of your heart and how well your heart's chambers and valves are working. This procedure takes approximately one hour. There are no restrictions for this procedure.   Your physician has requested that you have an exercise tolerance test. For further information please visit [email or web address]. Please also follow instruction sheet, as given.    Follow-Up:  Your physician recommends that you schedule a follow-up appointment in: AS NEEDED PENDING TEST RESULTS    Exercise Stress Electrocardiogram An exercise stress electrocardiogram is a test to check how blood flows to your heart. It is done to find areas of poor blood flow. You will need to walk on a treadmill for this test. The electrocardiogram will record your heartbeat when you are at rest and when you are exercising. What happens before the procedure?  Do not have drinks with caffeine or foods with caffeine for 24 hours before the test, or as told by your doctor. This includes coffee, tea (even decaf tea), sodas, chocolate, and cocoa.  Follow your doctor's instructions about eating and drinking before the test.  Ask your doctor what medicines you should or should not take before the test. Take your medicines with water unless told by your doctor not to.  If you use an inhaler, bring it with you to the test.  Bring a snack to eat after the test.  Do not  smoke for 4 hours before the test.  Do not put lotions, powders, creams, or oils on your chest before the test.  Wear comfortable shoes and clothing. What happens during the procedure?  You will have patches put on your chest. Small areas of your chest may need to be shaved. Wires will be connected to the  patches.  Your heart rate will be watched while you are resting and while you are exercising.  You will walk on the treadmill. The treadmill will slowly get faster to raise your heart rate.  The test will take about 1-2 hours. What happens after the procedure?  Your heart rate and blood pressure will be watched after the test.  You may return to your normal diet, activities, and medicines or as told by your doctor. This information is not intended to replace advice given to you by your health care provider. Make sure you discuss any questions you have with your health care provider. Document Released: 11/28/2007 Document Revised: 02/08/2016 Document Reviewed: 02/16/2013 Elsevier Interactive Patient Education  [employer].

## 2017-12-18 ENCOUNTER — Ambulatory Visit: Payer: Medicare Other | Admitting: Physical Therapy

## 2017-12-18 DIAGNOSIS — M6281 Muscle weakness (generalized): Secondary | ICD-10-CM | POA: Diagnosis not present

## 2017-12-18 DIAGNOSIS — M542 Cervicalgia: Secondary | ICD-10-CM | POA: Diagnosis not present

## 2017-12-18 DIAGNOSIS — R293 Abnormal posture: Secondary | ICD-10-CM

## 2017-12-18 DIAGNOSIS — M545 Low back pain: Secondary | ICD-10-CM | POA: Diagnosis not present

## 2017-12-18 DIAGNOSIS — G8929 Other chronic pain: Secondary | ICD-10-CM

## 2017-12-18 DIAGNOSIS — M79601 Pain in right arm: Secondary | ICD-10-CM

## 2017-12-18 DIAGNOSIS — M79602 Pain in left arm: Secondary | ICD-10-CM

## 2017-12-18 NOTE — Therapy (Signed)
[facility] [address] [place], [place], [address] Phone: [phone]   Fax:  [phone]  Physical Therapy Treatment  Patient Details  Name: Kellie [name] MRN: [id number] Date of Birth: July 19, 1955 Referring Provider: Silverio Decamp, MD   Encounter Date: 12/18/2017  PT End of Session - 12/18/17 0847    Visit Number  3    Date for PT Re-Evaluation  01/22/18    Authorization Type  medicaid    PT Start Time  0804    PT Stop Time  0845    PT Time Calculation (min)  41 min    Activity Tolerance  Patient tolerated treatment well;No increased pain    Behavior During Therapy  WFL for tasks assessed/performed       Past Medical History:  Diagnosis Date  . Abnormal Pap smear of cervix   . Allergic rhinitis 11/08/2010  . Anxiety   . Asthma   . Bronchitis   . Herpes simplex of female genitalia   . Hyperlipidemia   . Mitral valve prolapse   . Osteoporosis 11/21/2011  . Urticaria     Past Surgical History:  Procedure Laterality Date  . CLOSED MANIPULATION SHOULDER  7&01/2005   x2, 30 days apart  . CLOSED MANIPULATION SHOULDER  2006   Left  . COLONOSCOPY  09/2012   Medium sized external hemorrhoids, few small divertic in left colon, otherwise normal colon (repeat 10 yrs)  . ESOPHAGOGASTRODUODENOSCOPY  09/2012   Normal (Dr. Ardis Hughs)  . LEEP  11/2004    There were no vitals filed for this visit.  Subjective Assessment - 12/18/17 0808    Subjective  Pt reports that she had her disability hearing and it was "stressful". She has been working on her exercises. No pain at the moment.     Limitations  Standing;Lifting    How long can you stand comfortably?  over an hour    Currently in Pain?  No/denies    Pain Onset  More than a month ago over a year                       [facility] PT Treatment/Exercise - 12/18/17 0001      Self-Care   Other Self-Care Comments   set up and use of lumbar roll  at home in various chairs       Lumbar Exercises: Stretches   Single Knee to Chest Stretch  5 reps;10 seconds;Right;Left    Double Knee to Chest Stretch  2 reps;20 seconds      Lumbar Exercises: Standing   Other Standing Lumbar Exercises  straight leg extension with green TB x15 reps each; hamstring curl with 2.5# ankle weigth x20 reps each      Lumbar Exercises: Sidelying   Clam  15 reps;Both;Limitations    Clam Limitations  green TB    Hip Abduction  Both;10 reps;Limitations    Hip Abduction Limitations  green TB             PT Education - 12/18/17 0847    Education Details  addressed questions regarding lumbar roll set up; updated HEP    Person(s) Educated  Patient    Methods  Explanation;Verbal cues;Handout    Comprehension  Returned demonstration;Verbalized understanding       PT Short Term Goals - 12/09/17 0853      PT SHORT TERM GOAL #1   Title  ind with initial HEP  Time  4    Period  Weeks    Status  On-going      PT SHORT TERM GOAL #2   Title  reports 25% reduction of pain during typical day    Time  4    Period  Weeks    Status  On-going        PT Long Term Goals - 11/27/17 1103      PT LONG TERM GOAL #1   Title  I with HEP    Baseline  does not know; has not learned    Time  8    Period  Weeks    Status  New    Target Date  01/22/18      PT LONG TERM GOAL #2   Title  report =/> 50% reduction in overall body pain to allow her to return to exercise at the gym    Baseline  8/10 pain    Time  8    Period  Weeks    Status  New    Target Date  01/22/18      PT LONG TERM GOAL #3   Title  improve bilateral LE strength to at least 4+/5 MMT with minimal pain    Baseline  hip extension, flexion, abd, add are 4-/5 with some pain    Time  8    Period  Weeks    Status  New    Target Date  01/22/18      PT LONG TERM GOAL #4   Title  improve cervical rotation =/> 60 degrees bilat    Baseline  Rt rotation 55 degrees; Lt rotation 35 degrees     Time  8    Status  New    Target Date  01/22/18      PT LONG TERM GOAL #5   Title  demonstrate ability to maintain upright posture throughout full treatment session due to improved strength and body awareness    Baseline  needs education and cues for sitting upright    Time  8    Period  Weeks    Status  New    Target Date  01/22/18            Plan - 12/18/17 0847    Clinical Impression Statement  Pt arrived today without pain. She has been consistently completing her HEP, although notes some difficulty with setting up her lumbar roll throughout the day. Therapist reviewed this with the pt and she demonstrated full understanding by the end of the session. Completed exercise to increase hip strength and she was able to complete with intermittent cuing from the therapist. End of session, updates were made to her HEP to address improvements in strength, and she reported no increase in pain.    Rehab Potential  Good    PT Frequency  1x / week    PT Duration  8 weeks    PT Treatment/Interventions  Iontophoresis 4mg /ml Dexamethasone;Manual techniques;Moist Heat;Patient/family education;Taping;Therapeutic exercise;Cryotherapy;Electrical Stimulation;ADLs/Self Care Home Management;Therapeutic activities;Neuromuscular re-education;Passive range of motion;Dry needling    PT Next Visit Plan  f/u on lumbar pillow; strengthening of core and hips (supine abdominal/UE work and sidelying hip strength); modalities last resort with focus on encouraging pain free movement and activity participation    PT Home Exercise Plan   Access Code: [email or web address]     Consulted and Agree with Plan of Care  Patient       Patient will benefit from skilled therapeutic intervention in  order to improve the following deficits and impairments:  Pain, Postural dysfunction, Increased muscle spasms, Decreased strength  Visit Diagnosis: Abnormal posture  Muscle weakness (generalized)  Chronic bilateral low back pain, with  sciatica presence unspecified  Cervicalgia  Pain in both upper extremities     Problem List Patient Active Problem List   Diagnosis Date Noted  . Atypical chest pain 10/22/2017  . Anxiety 10/22/2017  . History of loop electrical excision procedure (LEEP) 07/01/2017  . Unintentional weight loss 05/18/2017  . Elevated LDL cholesterol level 05/18/2017  . Hyperlipidemia 05/18/2017  . Irritable bowel syndrome with both constipation and diarrhea 05/18/2017  . Family history of pancreatic cancer 02/18/2017  . Diarrhea 02/18/2017  . Loss of weight 02/18/2017  . Weakness 10/05/2016  . Panic attacks 09/18/2016  . SOB (shortness of breath) 09/18/2016  . Bipolar 1 disorder with moderate mania ([place]) 09/17/2016  . Pulmonary nodule 08/08/2016  . Hoarseness 03/09/2016  . Left knee pain 11/09/2015  . Pain in joint, lower leg 11/09/2015  . Plantar fasciitis of left foot 11/09/2015  . Cough, persistent 07/21/2015  . Post-nasal drip 07/21/2015  . DDD (degenerative disc disease), cervical 07/05/2015  . Chronic nasal congestion 03/01/2015  . Vitreous floaters of right eye 03/01/2015  . Near syncope 02/09/2015  . Rhinitis, allergic 10/14/2014  . Family history of renal cancer 10/11/2014  . DDD (degenerative disc disease), lumbar 10/11/2014  . Bipolar disorder with depression ([place]) 06/02/2014  . Postmenopausal atrophic vaginitis 03/17/2014  . Prediabetes 12/14/2013  . Encounter for gynecological examination with Papanicolaou smear of cervix 12/08/2013  . Adhesive capsulitis of right shoulder 11/24/2013  . Chronic periodontal disease 06/30/2013  . Genital herpes 11/05/2012  . Hemorrhoids 11/05/2012  . Borderline intellectual functioning 02/15/2012  . Osteoporosis 11/21/2011  . Allergic rhinitis 11/08/2010  . Insomnia 11/08/2010  . GERD (gastroesophageal reflux disease) 09/27/2010   8:52 AM,12/18/17 Sherol Dade PT, DPT [facility] [address] [place], [place], [address] Phone: [phone]   Fax:  [phone]  Name: KYISHA FOWLE MRN: [id number] Date of Birth: 1955/12/07

## 2017-12-23 ENCOUNTER — Ambulatory Visit: Payer: Medicare Other | Attending: Sports Medicine | Admitting: Physical Therapy

## 2017-12-23 ENCOUNTER — Encounter: Payer: Self-pay | Admitting: Physical Therapy

## 2017-12-23 DIAGNOSIS — R293 Abnormal posture: Secondary | ICD-10-CM | POA: Diagnosis not present

## 2017-12-23 DIAGNOSIS — G8929 Other chronic pain: Secondary | ICD-10-CM

## 2017-12-23 DIAGNOSIS — M545 Low back pain: Secondary | ICD-10-CM | POA: Diagnosis present

## 2017-12-23 DIAGNOSIS — M6281 Muscle weakness (generalized): Secondary | ICD-10-CM | POA: Diagnosis present

## 2017-12-23 DIAGNOSIS — M542 Cervicalgia: Secondary | ICD-10-CM | POA: Insufficient documentation

## 2017-12-23 NOTE — Therapy (Signed)
[facility] [address] [place], [place], [address] Phone: [phone]   Fax:  [phone]  Physical Therapy Treatment/discharge  Patient Details  Name: Kellie [name] MRN: [id number] Date of Birth: [birth year] Referring Provider: Silverio Decamp, MD   Encounter Date: 12/23/2017  PT End of Session - 12/23/17 0912    Visit Number  4    Date for PT Re-Evaluation  01/22/18    Authorization Type  medicaid    PT Start Time  0835    PT Stop Time  0911    PT Time Calculation (min)  36 min    Activity Tolerance  Patient tolerated treatment well;No increased pain    Behavior During Therapy  WFL for tasks assessed/performed       Past Medical History:  Diagnosis Date  . Abnormal Pap smear of cervix   . Allergic rhinitis 11/08/2010  . Anxiety   . Asthma   . Bronchitis   . Herpes simplex of female genitalia   . Hyperlipidemia   . Mitral valve prolapse   . Osteoporosis 11/21/2011  . Urticaria     Past Surgical History:  Procedure Laterality Date  . CLOSED MANIPULATION SHOULDER  7&01/2005   x2, 30 days apart  . CLOSED MANIPULATION SHOULDER  2006   Left  . COLONOSCOPY  09/2012   Medium sized external hemorrhoids, few small divertic in left colon, otherwise normal colon (repeat 10 yrs)  . ESOPHAGOGASTRODUODENOSCOPY  09/2012   Normal (Dr. Ardis Hughs)  . LEEP  11/2004    There were no vitals filed for this visit.  Subjective Assessment - 12/23/17 0837    Subjective  Pt reports that she is doing her exercises at home. She still has back pain, but thinks it is a little better. She has noticed it is less often. She notices her pain mostly when standing for about 10-15 min or bending over.     Limitations  Standing;Lifting    How long can you stand comfortably?  over an hour    Currently in Pain?  No/denies    Pain Onset  More than a month ago over a year         [facility] PT Assessment - 12/23/17 0001      Assessment    Medical Diagnosis  M50.30 (ICD-10-CM) - DDD (degenerative disc disease), cervical    Onset Date/Surgical Date  03/06/17    Prior Therapy  Yes      Precautions   Precautions  None      Restrictions   Weight Bearing Restrictions  No      Balance Screen   Has the patient fallen in the past 6 months  Yes    How many times?  1    Has the patient had a decrease in activity level because of a fear of falling?   Yes    Is the patient reluctant to leave their home because of a fear of falling?   Yes      [place] residence    Living Arrangements  Alone      Prior Function   Level of Independence  Independent      Cognition   Overall Cognitive Status  Within Functional Limits for tasks assessed      Posture/Postural Control   Posture/Postural Control  No significant limitations    Postural Limitations  -- c-curve in sitting      AROM  Cervical - Right Side Bend  45    Cervical - Left Side Bend  45    Cervical - Right Rotation  60    Cervical - Left Rotation  60    Lumbar Flexion  --    Lumbar Extension  50% limited    Lumbar - Right Side Bend  WFL painful    Lumbar - Left Side Bend  WFL painful    Lumbar - Right Rotation  WFL    Lumbar - Left Rotation  [facility]      Flexibility   Soft Tissue Assessment /Muscle Length  yes    Hamstrings  WNL      Palpation   Palpation comment  non tender with palpation along lumbar paraspinals and gluteals       Ambulation/Gait   Gait Pattern  Within Functional Limits                   [facility] Adult PT Treatment/Exercise - 12/23/17 0001      Self-Care   Other Self-Care Comments   use of lumbar roll       Lumbar Exercises: Standing   Row  Both;5 reps green TB for HEP technique     Other Standing Lumbar Exercises  side stepping with red TB around feet, BUE support on countertop x2 rounds Lt/Rt    Other Standing Lumbar Exercises  BUE pressdown with red TB x10 reps      Lumbar Exercises:  Supine   Other Supine Lumbar Exercises  deadbug x5 reps Lt and Rt             PT Education - 12/23/17 0911    Education Details  technique with therex ; updates to HEP and benefits of progressing sets/resistance to further increase her activity tolerance    Person(s) Educated  Patient    Methods  Explanation;Handout    Comprehension  Verbalized understanding;Returned demonstration       PT Short Term Goals - 12/23/17 0844      PT SHORT TERM GOAL #1   Title  ind with initial HEP    Time  4    Period  Weeks    Status  Achieved      PT SHORT TERM GOAL #2   Title  reports 25% reduction of pain during typical day    Baseline  50% improved     Time  4    Period  Weeks    Status  Achieved        PT Long Term Goals - 12/23/17 0845      PT LONG TERM GOAL #1   Title  I with HEP    Baseline  reviewed last visit    Time  8    Period  Weeks    Status  Achieved      PT LONG TERM GOAL #2   Title  report =/> 50% reduction in overall body pain to allow her to return to exercise at the gym    Baseline  50% improved     Time  8    Period  Weeks    Status  Achieved      PT LONG TERM GOAL #3   Title  improve bilateral LE strength to at least 4+/5 MMT with minimal pain    Baseline  hip abduction 5/5 MMT, hip extension 4+/5    Time  8    Period  Weeks    Status  Achieved  PT LONG TERM GOAL #4   Title  improve cervical rotation =/> 60 degrees bilat    Baseline  rotation 60 deg     Time  8    Status  Achieved      PT LONG TERM GOAL #5   Title  demonstrate ability to maintain upright posture throughout full treatment session due to improved strength and body awareness    Baseline  no cuing needed    Time  8    Period  Weeks    Status  Achieved            Plan - 12/23/17 0913    Clinical Impression Statement  Pt was discharged this visit having met all of her short and long term goals since beginning PT. She reports atleast 50% improvement in her low back  pain, with it primarily bothering her if she does alot of house work or stands still for long periods of time. She demonstrates atleast 4/5 MMT of the BLE and her cervical rotation active ROM is atleast 60 deg and pain free. She has been compliant with her HEP and is comfortable completing a detailed program at her home and gym moving forward. She is agreeable with d/c at this time which will allow her to continue improving trunk and LE strength, endurance and flexibility.    Rehab Potential  Good    PT Frequency  1x / week    PT Duration  8 weeks    PT Treatment/Interventions  Iontophoresis 37m/ml Dexamethasone;Manual techniques;Moist Heat;Patient/family education;Taping;Therapeutic exercise;Cryotherapy;Electrical Stimulation;ADLs/Self Care Home Management;Therapeutic activities;Neuromuscular re-education;Passive range of motion;Dry needling    PT Next Visit Plan  d/c home     PT Home Exercise Plan   Access Code: [email or web address]    Consulted and Agree with Plan of Care  Patient       Patient will benefit from skilled therapeutic intervention in order to improve the following deficits and impairments:  Pain, Postural dysfunction, Increased muscle spasms, Decreased strength  Visit Diagnosis: Abnormal posture  Muscle weakness (generalized)  Chronic bilateral low back pain, with sciatica presence unspecified  Cervicalgia     Problem List Patient Active Problem List   Diagnosis Date Noted  . Atypical chest pain 10/22/2017  . Anxiety 10/22/2017  . History of loop electrical excision procedure (LEEP) 07/01/2017  . Unintentional weight loss 05/18/2017  . Elevated LDL cholesterol level 05/18/2017  . Hyperlipidemia 05/18/2017  . Irritable bowel syndrome with both constipation and diarrhea 05/18/2017  . Family history of pancreatic cancer 02/18/2017  . Diarrhea 02/18/2017  . Loss of weight 02/18/2017  . Weakness 10/05/2016  . Panic attacks 09/18/2016  . SOB (shortness of breath) 09/18/2016   . Bipolar 1 disorder with moderate mania ([place] 09/17/2016  . Pulmonary nodule 08/08/2016  . Hoarseness 03/09/2016  . Left knee pain 11/09/2015  . Pain in joint, lower leg 11/09/2015  . Plantar fasciitis of left foot 11/09/2015  . Cough, persistent 07/21/2015  . Post-nasal drip 07/21/2015  . DDD (degenerative disc disease), cervical 07/05/2015  . Chronic nasal congestion 03/01/2015  . Vitreous floaters of right eye 03/01/2015  . Near syncope 02/09/2015  . Rhinitis, allergic 10/14/2014  . Family history of renal cancer 10/11/2014  . DDD (degenerative disc disease), lumbar 10/11/2014  . Bipolar disorder with depression ([place] 06/02/2014  . Postmenopausal atrophic vaginitis 03/17/2014  . Prediabetes 12/14/2013  . Encounter for gynecological examination with Papanicolaou smear of cervix 12/08/2013  . Adhesive capsulitis of right shoulder 11/24/2013  .  Chronic periodontal disease 06/30/2013  . Genital herpes 11/05/2012  . Hemorrhoids 11/05/2012  . Borderline intellectual functioning 02/15/2012  . Osteoporosis 11/21/2011  . Allergic rhinitis 11/08/2010  . Insomnia 11/08/2010  . GERD (gastroesophageal reflux disease) 09/27/2010    PHYSICAL THERAPY DISCHARGE SUMMARY  Visits from Start of Care: 3  Current functional level related to goals / functional outcomes: See above for more details    Remaining deficits: See above for more details    Education / Equipment: See above for more details  Plan: Patient agrees to discharge.  Patient goals were met. Patient is being discharged due to meeting the stated rehab goals.  ?????       9:24 AM,12/23/17 [place], [place] [facility] [place]  [facility] [address], [place], [place], [address] Phone: [phone]   Fax:  [phone]  Name: Kellie [name] MRN: [id number] Date of Birth: March 08, 1956

## 2017-12-23 NOTE — Patient Instructions (Signed)
Access Code: [email or web address]  URL: [email or web address]  Date: 12/23/2017  Prepared by: Elly Modena   Exercises  Clamshell with Resistance - 15 reps - 2 sets - 2x daily - 7x weekly  Bridge with Resistance - 10 reps - 3 sets - 1x daily - 7x weekly  Squat with Resistance at Thighs - 10 reps - 3 sets - 1x daily - 7x weekly  Standing Shoulder Row with Anchored Resistance - 10 reps - 3 sets - 1x daily - 7x weekly  Side Stepping with Counter Support - 10 reps - 3 sets - 1x daily - 7x weekly  Supine Dead Bug with Leg Extension - 10 reps - 3 sets - 1x daily - 7x weekly  Supine Lower Trunk Rotation - 10 reps - 3 sets - 1x daily - 7x weekly  Single Arm Shoulder Extension with Anchored Resistance - 10 reps - 3 sets - 1x daily - 7x weekly  Patient Education  Office Posture  Sway Back Posture   [facility] [address] [place], [place] [address] Phone # [phone] Fax [phone]

## 2017-12-24 ENCOUNTER — Other Ambulatory Visit (HOSPITAL_COMMUNITY): Payer: Self-pay

## 2018-01-03 ENCOUNTER — Ambulatory Visit (INDEPENDENT_AMBULATORY_CARE_PROVIDER_SITE_OTHER): Payer: Medicare Other | Admitting: Licensed Clinical Social Worker

## 2018-01-03 ENCOUNTER — Encounter (HOSPITAL_COMMUNITY): Payer: Self-pay | Admitting: Licensed Clinical Social Worker

## 2018-01-03 DIAGNOSIS — F310 Bipolar disorder, current episode hypomanic: Secondary | ICD-10-CM | POA: Diagnosis not present
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
## 2018-01-07 ENCOUNTER — Ambulatory Visit (HOSPITAL_COMMUNITY): Payer: Medicare Other | Attending: Cardiovascular Disease

## 2018-01-07 ENCOUNTER — Ambulatory Visit (INDEPENDENT_AMBULATORY_CARE_PROVIDER_SITE_OTHER): Payer: Medicare Other

## 2018-01-07 ENCOUNTER — Other Ambulatory Visit: Payer: Self-pay

## 2018-01-07 DIAGNOSIS — I080 Rheumatic disorders of both mitral and aortic valves: Secondary | ICD-10-CM | POA: Insufficient documentation

## 2018-01-07 DIAGNOSIS — R072 Precordial pain: Secondary | ICD-10-CM

## 2018-01-07 DIAGNOSIS — R0600 Dyspnea, unspecified: Secondary | ICD-10-CM | POA: Insufficient documentation

## 2018-01-07 DIAGNOSIS — E785 Hyperlipidemia, unspecified: Secondary | ICD-10-CM | POA: Insufficient documentation

## 2018-01-07 DIAGNOSIS — Z8249 Family history of ischemic heart disease and other diseases of the circulatory system: Secondary | ICD-10-CM | POA: Insufficient documentation

## 2018-01-07 DIAGNOSIS — J45909 Unspecified asthma, uncomplicated: Secondary | ICD-10-CM | POA: Insufficient documentation

## 2018-01-07 DIAGNOSIS — I341 Nonrheumatic mitral (valve) prolapse: Secondary | ICD-10-CM | POA: Diagnosis not present

## 2018-01-07 LAB — EXERCISE TOLERANCE TEST
CSEPED: 8 min
CSEPHR: 92 %
CSEPPHR: 146 {beats}/min
Estimated workload: 10.1 METS
Exercise duration (sec): 0 s
MPHR: 157 {beats}/min
RPE: 18
Rest HR: 63 {beats}/min

## 2018-01-10 ENCOUNTER — Encounter: Payer: Self-pay | Admitting: Physician Assistant

## 2018-01-10 ENCOUNTER — Ambulatory Visit (INDEPENDENT_AMBULATORY_CARE_PROVIDER_SITE_OTHER): Payer: Medicare Other | Admitting: Physician Assistant

## 2018-01-10 VITALS — BP 105/58 | HR 74 | Ht 66.0 in | Wt 110.0 lb

## 2018-01-10 DIAGNOSIS — I341 Nonrheumatic mitral (valve) prolapse: Secondary | ICD-10-CM | POA: Insufficient documentation

## 2018-01-10 DIAGNOSIS — Z820 Family history of epilepsy and other diseases of the nervous system: Secondary | ICD-10-CM

## 2018-01-10 DIAGNOSIS — Z131 Encounter for screening for diabetes mellitus: Secondary | ICD-10-CM

## 2018-01-10 DIAGNOSIS — K640 First degree hemorrhoids: Secondary | ICD-10-CM | POA: Diagnosis not present

## 2018-01-10 DIAGNOSIS — Z13220 Encounter for screening for lipoid disorders: Secondary | ICD-10-CM | POA: Diagnosis not present

## 2018-01-10 DIAGNOSIS — I351 Nonrheumatic aortic (valve) insufficiency: Secondary | ICD-10-CM | POA: Diagnosis not present

## 2018-01-10 DIAGNOSIS — G478 Other sleep disorders: Secondary | ICD-10-CM | POA: Diagnosis not present

## 2018-01-10 DIAGNOSIS — K219 Gastro-esophageal reflux disease without esophagitis: Secondary | ICD-10-CM

## 2018-01-10 DIAGNOSIS — R0683 Snoring: Secondary | ICD-10-CM | POA: Diagnosis not present

## 2018-01-10 DIAGNOSIS — Z0000 Encounter for general adult medical examination without abnormal findings: Secondary | ICD-10-CM | POA: Diagnosis not present

## 2018-01-10 MED ORDER — HYDROCORTISONE ACE-PRAMOXINE 1-1 % RE FOAM
1.0000 | Freq: Two times a day (BID) | RECTAL | 1 refills | Status: DC
Start: 2018-01-10 — End: 2020-01-19

## 2018-01-10 NOTE — Progress Notes (Signed)
Subjective:    Patient ID: Kellie [name], female    DOB: 20-May-1956, 62 y.o.   MRN: [id number]  HPI  Pt is a 62 yo female with bipolar uncontrolled  who presents to the clinic to discuss multiple questions.   She had never heard from cardiac testing her results. She would like to go over these.   She is concerned about sleep apnea. She has a family hx of this with her son and sister. She snores and wakes up feeling tired and not rested.   She has hx of hemrrhoids. Her constipation is bad right now. Questions next step. Not taking anything. She is using OTC prep H for hemorrhoids.   .. Active Ambulatory Problems    Diagnosis Date Noted  . GERD (gastroesophageal reflux disease) 09/27/2010  . Allergic rhinitis 11/08/2010  . Insomnia 11/08/2010  . Osteoporosis 11/21/2011  . Borderline intellectual functioning 02/15/2012  . Genital herpes 11/05/2012  . Hemorrhoids 11/05/2012  . Chronic periodontal disease 06/30/2013  . Adhesive capsulitis of right shoulder 11/24/2013  . Encounter for gynecological examination with Papanicolaou smear of cervix 12/08/2013  . Prediabetes 12/14/2013  . Postmenopausal atrophic vaginitis 03/17/2014  . Bipolar disorder with depression ([place]) 06/02/2014  . Family history of renal cancer 10/11/2014  . DDD (degenerative disc disease), lumbar 10/11/2014  . Rhinitis, allergic 10/14/2014  . Near syncope 02/09/2015  . Chronic nasal congestion 03/01/2015  . Vitreous floaters of right eye 03/01/2015  . DDD (degenerative disc disease), cervical 07/05/2015  . Cough, persistent 07/21/2015  . Post-nasal drip 07/21/2015  . Left knee pain 11/09/2015  . Pain in joint, lower leg 11/09/2015  . Plantar fasciitis of left foot 11/09/2015  . Hoarseness 03/09/2016  . Pulmonary nodule 08/08/2016  . Bipolar 1 disorder with moderate mania ([place]) 09/17/2016  . Panic attacks 09/18/2016  . SOB (shortness of breath) 09/18/2016  . Weakness 10/05/2016  . Family history of  pancreatic cancer 02/18/2017  . Diarrhea 02/18/2017  . Loss of weight 02/18/2017  . Unintentional weight loss 05/18/2017  . Elevated LDL cholesterol level 05/18/2017  . Hyperlipidemia 05/18/2017  . Irritable bowel syndrome with both constipation and diarrhea 05/18/2017  . History of loop electrical excision procedure (LEEP) 07/01/2017  . Atypical chest pain 10/22/2017  . Anxiety 10/22/2017  . Aortic insufficiency 01/10/2018  . Non-restorative sleep 01/10/2018  . Snoring 01/10/2018   Resolved Ambulatory Problems    Diagnosis Date Noted  . Sinusitis 09/27/2010  . Depression 09/27/2010  . Bronchitis 12/04/2010  . Cough 06/02/2011  . General medical examination 10/18/2011  . Dizziness 11/21/2011  . Knee pain 11/21/2011  . Low back pain 11/21/2011  . Heel pain 07/14/2012  . Thoracic myofascial strain 03/13/2013  . Sinusitis 04/20/2013  . Cough 04/22/2013  . Rotator cuff disorder 08/01/2013  . Right shoulder pain 10/27/2013  . Adhesive capsulitis 11/02/2013  . Pain in joint, shoulder region 11/24/2013  . Bipolar disorder ([place]) 04/29/2014  . Right serous otitis media 08/03/2014  . Acute recurrent maxillary sinusitis 10/14/2014  . Right-sided low back pain without sciatica 10/14/2014  . Sinusitis 02/09/2015  . Sacroiliac joint dysfunction of right side 02/25/2015  . Piriformis syndrome 03/01/2015  . Adhesive capsulitis 07/05/2015  . Radiculitis of right cervical region 07/06/2015  . Drug reaction 09/25/2016  . Mitral valve prolapse 01/10/2018   Past Medical History:  Diagnosis Date  . Abnormal Pap smear of cervix   . Allergic rhinitis 11/08/2010  . Anxiety   . Asthma   .  Bronchitis   . Herpes simplex of female genitalia   . Hyperlipidemia   . Mitral valve prolapse   . Osteoporosis 11/21/2011  . Urticaria      Review of Systems    see HPI.  Objective:   Physical Exam  Constitutional: She is oriented to person, place, and time. She appears well-developed and  well-nourished.  HENT:  Head: Normocephalic and atraumatic.  Cardiovascular: Normal rate and regular rhythm.  No murmur heard. Pulmonary/Chest: Effort normal and breath sounds normal.  Neurological: She is alert and oriented to person, place, and time.  Psychiatric: She has a normal mood and affect. Her behavior is normal.  declines rectal exam today. She does not want lancing even if they need it.         Assessment & Plan:  [name] KitchenMarland KitchenFaryal was seen today for hemorrhoids.  Diagnoses and all orders for this visit:  Non-restorative sleep -     Split night study  Nonrheumatic aortic valve insufficiency  Snoring -     Split night study  Screening for lipid disorders -     Lipid Panel w/reflex Direct LDL  Screening for diabetes mellitus -     COMPLETE METABOLIC PANEL WITH GFR -     Hemoglobin A1c  Preventative health care -     Lipid Panel w/reflex Direct LDL -     COMPLETE METABOLIC PANEL WITH GFR -     TSH -     Hemoglobin A1c  Grade I hemorrhoids -     hydrocortisone-pramoxine (PROCTOFOAM-HC) rectal foam; Place 1 applicator rectally 2 (two) times daily.  Family history of sleep apnea -     Split night study  Gastroesophageal reflux disease without esophagitis   Printed off echo and stress test results. Given reassurance that everything looked great.  Requested fasting labs. Ordered today.   Discussed constipation with miralax 1-2 capfuls up to twice a day until stooling regularly. Consider colace for stool softening. Hydrocortisone cream given for hemorrhoids.   Due to non restorative sleep, snoring, and family hx will order sleep study. Pt is not obese and does not have the size of her neck risk factor.

## 2018-01-10 NOTE — Patient Instructions (Signed)
miralax 1 capful in 4 oz of gatorrade.    Hemorrhoids Hemorrhoids are swollen veins in and around the rectum or anus. There are two types of hemorrhoids:  Internal hemorrhoids. These occur in the veins that are just inside the rectum. They may poke through to the outside and become irritated and painful.  External hemorrhoids. These occur in the veins that are outside of the anus and can be felt as a painful swelling or hard lump near the anus.  Most hemorrhoids do not cause serious problems, and they can be managed with home treatments such as diet and lifestyle changes. If home treatments do not help your symptoms, procedures can be done to shrink or remove the hemorrhoids. What are the causes? This condition is caused by increased pressure in the anal area. This pressure may result from various things, including:  Constipation.  Straining to have a bowel movement.  Diarrhea.  Pregnancy.  Obesity.  Sitting for long periods of time.  Heavy lifting or other activity that causes you to strain.  Anal sex.  What are the signs or symptoms? Symptoms of this condition include:  Pain.  Anal itching or irritation.  Rectal bleeding.  Leakage of stool (feces).  Anal swelling.  One or more lumps around the anus.  How is this diagnosed? This condition can often be diagnosed through a visual exam. Other exams or tests may also be done, such as:  Examination of the rectal area with a gloved hand (digital rectal exam).  Examination of the anal canal using a small tube (anoscope).  A blood test, if you have lost a significant amount of blood.  A test to look inside the colon (sigmoidoscopy or colonoscopy).  How is this treated? This condition can usually be treated at home. However, various procedures may be done if dietary changes, lifestyle changes, and other home treatments do not help your symptoms. These procedures can help make the hemorrhoids smaller or remove them  completely. Some of these procedures involve surgery, and others do not. Common procedures include:  Rubber band ligation. Rubber bands are placed at the base of the hemorrhoids to cut off the blood supply to them.  Sclerotherapy. Medicine is injected into the hemorrhoids to shrink them.  Infrared coagulation. A type of light energy is used to get rid of the hemorrhoids.  Hemorrhoidectomy surgery. The hemorrhoids are surgically removed, and the veins that supply them are tied off.  Stapled hemorrhoidopexy surgery. A circular stapling device is used to remove the hemorrhoids and use staples to cut off the blood supply to them.  Follow these instructions at home: Eating and drinking  Eat foods that have a lot of fiber in them, such as whole grains, beans, nuts, fruits, and vegetables. Ask your health care provider about taking products that have added fiber (fiber supplements).  Drink enough fluid to keep your urine clear or pale yellow. Managing pain and swelling  Take warm sitz baths for 20 minutes, 3-4 times a day to ease pain and discomfort.  If directed, apply ice to the affected area. Using ice packs between sitz baths may be helpful. ? Put ice in a plastic bag. ? Place a towel between your skin and the bag. ? Leave the ice on for 20 minutes, 2-3 times a day. General instructions  Take over-the-counter and prescription medicines only as told by your health care provider.  Use medicated creams or suppositories as told.  Exercise regularly.  Go to the bathroom when you  have the urge to have a bowel movement. Do not wait.  Avoid straining to have bowel movements.  Keep the anal area dry and clean. Use wet toilet paper or moist towelettes after a bowel movement.  Do not sit on the toilet for long periods of time. This increases blood pooling and pain. Contact a health care provider if:  You have increasing pain and swelling that are not controlled by treatment or  medicine.  You have uncontrolled bleeding.  You have difficulty having a bowel movement, or you are unable to have a bowel movement.  You have pain or inflammation outside the area of the hemorrhoids. This information is not intended to replace advice given to you by your health care provider. Make sure you discuss any questions you have with your health care provider. Document Released: 06/08/2000 Document Revised: 11/09/2015 Document Reviewed: 02/23/2015 Elsevier Interactive Patient Education  [employer].

## 2018-01-13 ENCOUNTER — Encounter: Payer: Self-pay | Admitting: Physician Assistant

## 2018-01-13 ENCOUNTER — Telehealth: Payer: Self-pay | Admitting: Physician Assistant

## 2018-01-13 NOTE — Telephone Encounter (Signed)
Printed will send referral - CF

## 2018-01-13 NOTE — Telephone Encounter (Signed)
Ordered sleep study. Please print for me. She is ok with lab or at home. Which ever insurance pays for.

## 2018-01-14 LAB — HEMOGLOBIN A1C
HEMOGLOBIN A1C: 5.6 %{Hb} (ref <5.7)
Mean Plasma Glucose: 114 (calc)
eAG (mmol/L): 6.3 (calc)

## 2018-01-14 LAB — COMPLETE METABOLIC PANEL WITH GFR
AG Ratio: 2.2 (calc) (ref 1.0–2.5)
ALKALINE PHOSPHATASE (APISO): 50 U/L (ref 33–130)
ALT: 12 U/L (ref 6–29)
AST: 18 U/L (ref 10–35)
Albumin: 4.8 g/dL (ref 3.6–5.1)
BILIRUBIN TOTAL: 0.5 mg/dL (ref 0.2–1.2)
BUN: 17 mg/dL (ref 7–25)
CHLORIDE: 102 mmol/L (ref 98–110)
CO2: 31 mmol/L (ref 20–32)
CREATININE: 0.79 mg/dL (ref 0.50–0.99)
Calcium: 10.1 mg/dL (ref 8.6–10.4)
GFR, Est African American: 92 mL/min/{1.73_m2} (ref >=60)
GFR, Est Non African American: 80 mL/min/{1.73_m2} (ref >=60)
GLUCOSE: 90 mg/dL (ref 65–99)
Globulin: 2.2 g/dL (calc) (ref 1.9–3.7)
Potassium: 4.5 mmol/L (ref 3.5–5.3)
SODIUM: 139 mmol/L (ref 135–146)
Total Protein: 7 g/dL (ref 6.1–8.1)

## 2018-01-14 LAB — LIPID PANEL W/REFLEX DIRECT LDL
CHOL/HDL RATIO: 2.2 (calc) (ref <5.0)
Cholesterol: 230 mg/dL — ABNORMAL HIGH (ref <200)
HDL: 103 mg/dL (ref >50)
LDL CHOLESTEROL (CALC): 114 mg/dL — AB
Non-HDL Cholesterol (Calc): 127 mg/dL (calc) (ref <130)
TRIGLYCERIDES: 52 mg/dL (ref <150)

## 2018-01-14 LAB — TSH: TSH: 3.26 m[IU]/L (ref 0.40–4.50)

## 2018-01-20 NOTE — Progress Notes (Signed)
Call pt: cholesterol much better. Great job. HDL is fabulous. Glucose. Kidney, liver are in normal range. a1c shows a slight increase towards prediabetes but still in normal range. Overall labs look GREAT.

## 2018-01-31 ENCOUNTER — Ambulatory Visit (HOSPITAL_COMMUNITY): Payer: Self-pay | Admitting: Licensed Clinical Social Worker

## 2018-02-03 ENCOUNTER — Ambulatory Visit: Payer: Medicare Other | Attending: Physician Assistant | Admitting: Neurology

## 2018-02-03 DIAGNOSIS — G478 Other sleep disorders: Secondary | ICD-10-CM | POA: Diagnosis not present

## 2018-02-03 DIAGNOSIS — G4733 Obstructive sleep apnea (adult) (pediatric): Secondary | ICD-10-CM | POA: Insufficient documentation

## 2018-02-03 DIAGNOSIS — R0683 Snoring: Secondary | ICD-10-CM | POA: Insufficient documentation

## 2018-02-03 DIAGNOSIS — Z820 Family history of epilepsy and other diseases of the nervous system: Secondary | ICD-10-CM | POA: Diagnosis not present

## 2018-02-08 ENCOUNTER — Encounter (HOSPITAL_COMMUNITY): Payer: Self-pay | Admitting: Emergency Medicine

## 2018-02-08 ENCOUNTER — Emergency Department (HOSPITAL_COMMUNITY): Payer: Medicare Other

## 2018-02-08 ENCOUNTER — Other Ambulatory Visit: Payer: Self-pay

## 2018-02-08 ENCOUNTER — Emergency Department (HOSPITAL_COMMUNITY)
Admission: EM | Admit: 2018-02-08 | Discharge: 2018-02-08 | Disposition: A | Discharge status: Home / Self Care | Payer: Medicare Other | Attending: Emergency Medicine | Admitting: Emergency Medicine

## 2018-02-08 DIAGNOSIS — R4183 Borderline intellectual functioning: Secondary | ICD-10-CM | POA: Insufficient documentation

## 2018-02-08 DIAGNOSIS — R1030 Lower abdominal pain, unspecified: Secondary | ICD-10-CM | POA: Diagnosis present

## 2018-02-08 DIAGNOSIS — Z79899 Other long term (current) drug therapy: Secondary | ICD-10-CM | POA: Diagnosis not present

## 2018-02-08 DIAGNOSIS — R55 Syncope and collapse: Secondary | ICD-10-CM | POA: Insufficient documentation

## 2018-02-08 DIAGNOSIS — K5732 Diverticulitis of large intestine without perforation or abscess without bleeding: Secondary | ICD-10-CM | POA: Diagnosis not present

## 2018-02-08 DIAGNOSIS — J45909 Unspecified asthma, uncomplicated: Secondary | ICD-10-CM | POA: Insufficient documentation

## 2018-02-08 DIAGNOSIS — R51 Headache: Secondary | ICD-10-CM | POA: Insufficient documentation

## 2018-02-08 LAB — CBC WITH DIFFERENTIAL/PLATELET
BASOS ABS: 0 10*3/uL (ref 0.0–0.1)
BASOS PCT: 0 %
Eosinophils Absolute: 0.1 10*3/uL (ref 0.0–0.7)
Eosinophils Relative: 1 %
HEMATOCRIT: 36.2 % (ref 36.0–46.0)
Hemoglobin: 12.1 g/dL (ref 12.0–15.0)
LYMPHS PCT: 12 %
Lymphs Abs: 0.9 10*3/uL (ref 0.7–4.0)
MCH: 31 pg (ref 26.0–34.0)
MCHC: 33.4 g/dL (ref 30.0–36.0)
MCV: 92.8 fL (ref 78.0–100.0)
Monocytes Absolute: 0.7 10*3/uL (ref 0.1–1.0)
Monocytes Relative: 9 %
NEUTROS ABS: 5.8 10*3/uL (ref 1.7–7.7)
Neutrophils Relative %: 78 %
PLATELETS: 270 10*3/uL (ref 150–400)
RBC: 3.9 MIL/uL (ref 3.87–5.11)
RDW: 12.6 % (ref 11.5–15.5)
WBC: 7.5 10*3/uL (ref 4.0–10.5)

## 2018-02-08 LAB — COMPREHENSIVE METABOLIC PANEL
ALBUMIN: 3.7 g/dL (ref 3.5–5.0)
ALT: 11 U/L (ref 0–44)
AST: 16 U/L (ref 15–41)
Alkaline Phosphatase: 39 U/L (ref 38–126)
Anion gap: 5 (ref 5–15)
BUN: 15 mg/dL (ref 8–23)
CHLORIDE: 105 mmol/L (ref 98–111)
CO2: 30 mmol/L (ref 22–32)
CREATININE: 0.76 mg/dL (ref 0.44–1.00)
Calcium: 9 mg/dL (ref 8.9–10.3)
GFR calc Af Amer: >60 mL/min (ref >60)
GLUCOSE: 94 mg/dL (ref 70–99)
Potassium: 4.3 mmol/L (ref 3.5–5.1)
Sodium: 140 mmol/L (ref 135–145)
Total Bilirubin: 0.6 mg/dL (ref 0.3–1.2)
Total Protein: 6.6 g/dL (ref 6.5–8.1)

## 2018-02-08 LAB — TROPONIN I: Troponin I: <0.03 ng/mL (ref <0.03)

## 2018-02-08 LAB — POC OCCULT BLOOD, ED: Fecal Occult Bld: NEGATIVE

## 2018-02-08 LAB — LIPASE, BLOOD: LIPASE: 32 U/L (ref 11–51)

## 2018-02-08 LAB — I-STAT CG4 LACTIC ACID, ED: Lactic Acid, Venous: 0.97 mmol/L (ref 0.5–1.9)

## 2018-02-08 MED ORDER — METRONIDAZOLE 500 MG PO TABS: 500.0000 mg | ORAL_TABLET | Freq: Three times a day (TID) | ORAL | 0 refills | Status: DC

## 2018-02-08 MED ORDER — CIPROFLOXACIN HCL 250 MG PO TABS
500.0000 mg | ORAL_TABLET | Freq: Once | ORAL | Status: AC
  Administered 2018-02-08: 500 mg via ORAL
  Filled 2018-02-08: qty 2

## 2018-02-08 MED ORDER — IOPAMIDOL (ISOVUE-300) INJECTION 61%
100.0000 mL | Freq: Once | INTRAVENOUS | Status: AC | PRN
  Administered 2018-02-08: 100 mL via INTRAVENOUS

## 2018-02-08 MED ORDER — CIPROFLOXACIN HCL 500 MG PO TABS: 500.0000 mg | ORAL_TABLET | Freq: Two times a day (BID) | ORAL | 0 refills | Status: DC

## 2018-02-08 MED ORDER — HYDROCODONE-ACETAMINOPHEN 5-325 MG PO TABS: ORAL_TABLET | ORAL | 0 refills | Status: DC

## 2018-02-08 MED ORDER — SODIUM CHLORIDE 0.9 % IV BOLUS
1000.0000 mL | Freq: Once | INTRAVENOUS | Status: AC
  Administered 2018-02-08: 1000 mL via INTRAVENOUS

## 2018-02-08 MED ORDER — PROMETHAZINE HCL 12.5 MG PO TABS: 12.5000 mg | ORAL_TABLET | Freq: Four times a day (QID) | ORAL | 0 refills | Status: DC | PRN

## 2018-02-08 MED ORDER — IOPAMIDOL (ISOVUE-300) INJECTION 61%
30.0000 mL | Freq: Once | INTRAVENOUS | Status: AC | PRN
  Administered 2018-02-08: 30 mL via ORAL

## 2018-02-08 MED ORDER — METRONIDAZOLE 500 MG PO TABS
500.0000 mg | ORAL_TABLET | Freq: Once | ORAL | Status: AC
Start: 2018-02-08 — End: 2018-02-08
  Administered 2018-02-08: 500 mg via ORAL
  Filled 2018-02-08: qty 1

## 2018-02-08 NOTE — ED Provider Notes (Signed)
[facility] EMERGENCY DEPARTMENT Provider Note   CSN: [id number] Arrival date & time: 02/08/18  4627     History   Chief Complaint Chief Complaint  Patient presents with  . Abdominal Pain    HPI Kellie [name] is a 62 y.o. female.  Patient here with abdominal pain as well as syncopal episode.  States she been having intermittent lower abdominal pain for the past several days that reminds her of diverticulitis.  His pain is been coming and going but she felt well when she went to bed last night.  She woke up with this sensation be to have a bowel movement and went to the bathroom.  She began to feel lightheaded, dizzy and sweaty while in the toilet.  She was not able to have a bowel movement or urinate.  She try to make it back to bed but lost consciousness and fell on the floor.  And does not remember how she got there.  Unsure if she hit her head.  Complains of ongoing lower abdominal pain.  Denies nausea, vomiting, fever, diarrhea, blood in stool.  No vaginal bleeding or discharge.  No pain with urination or blood in the urine.  No chest pain or shortness of breath.  States she has passed out one time in the past after starting a new medication abilify.  Denies any cardiac history.  Patient had a tooth pulled about 4 days ago but states she is been eating and drinking well.  The history is provided by the patient.  Abdominal Pain   Associated symptoms include nausea and headaches. Pertinent negatives include fever, diarrhea, vomiting, constipation, dysuria, hematuria, arthralgias and myalgias.    Past Medical History:  Diagnosis Date  . Abnormal Pap smear of cervix   . Allergic rhinitis 11/08/2010  . Anxiety   . Asthma   . Bronchitis   . Herpes simplex of female genitalia   . Hyperlipidemia   . Mitral valve prolapse   . Osteoporosis 11/21/2011  . Urticaria     Patient Active Problem List   Diagnosis Date Noted  . Aortic insufficiency 01/10/2018  . Non-restorative sleep  01/10/2018  . Snoring 01/10/2018  . Atypical chest pain 10/22/2017  . Anxiety 10/22/2017  . History of loop electrical excision procedure (LEEP) 07/01/2017  . Unintentional weight loss 05/18/2017  . Elevated LDL cholesterol level 05/18/2017  . Hyperlipidemia 05/18/2017  . Irritable bowel syndrome with both constipation and diarrhea 05/18/2017  . Family history of pancreatic cancer 02/18/2017  . Diarrhea 02/18/2017  . Loss of weight 02/18/2017  . Weakness 10/05/2016  . Panic attacks 09/18/2016  . SOB (shortness of breath) 09/18/2016  . Bipolar 1 disorder with moderate mania ([place]) 09/17/2016  . Pulmonary nodule 08/08/2016  . Hoarseness 03/09/2016  . Left knee pain 11/09/2015  . Pain in joint, lower leg 11/09/2015  . Plantar fasciitis of left foot 11/09/2015  . Cough, persistent 07/21/2015  . Post-nasal drip 07/21/2015  . DDD (degenerative disc disease), cervical 07/05/2015  . Chronic nasal congestion 03/01/2015  . Vitreous floaters of right eye 03/01/2015  . Near syncope 02/09/2015  . Rhinitis, allergic 10/14/2014  . Family history of renal cancer 10/11/2014  . DDD (degenerative disc disease), lumbar 10/11/2014  . Bipolar disorder with depression ([place]) 06/02/2014  . Postmenopausal atrophic vaginitis 03/17/2014  . Prediabetes 12/14/2013  . Encounter for gynecological examination with Papanicolaou smear of cervix 12/08/2013  . Adhesive capsulitis of right shoulder 11/24/2013  . Chronic periodontal disease 06/30/2013  . Genital  herpes 11/05/2012  . Hemorrhoids 11/05/2012  . Borderline intellectual functioning 02/15/2012  . Osteoporosis 11/21/2011  . Allergic rhinitis 11/08/2010  . Insomnia 11/08/2010  . GERD (gastroesophageal reflux disease) 09/27/2010    Past Surgical History:  Procedure Laterality Date  . CLOSED MANIPULATION SHOULDER  7&01/2005   x2, 30 days apart  . CLOSED MANIPULATION SHOULDER  2006   Left  . COLONOSCOPY  09/2012   Medium sized external hemorrhoids,  few small divertic in left colon, otherwise normal colon (repeat 10 yrs)  . ESOPHAGOGASTRODUODENOSCOPY  09/2012   Normal (Dr. Ardis Hughs)  . LEEP  11/2004     OB History    Gravida  1   Para  1   Term  1   Preterm      AB      Living  1     SAB      TAB      Ectopic      Multiple      Live Births               Home Medications    Prior to Admission medications   Medication Sig Start Date End Date Taking? Authorizing Provider  acyclovir (ZOVIRAX) 200 MG capsule Take 2 capsules (400 mg total) by mouth 2 (two) times daily. 10/22/17   Breeback, Royetta Car, PA-C  alendronate (FOSAMAX) 70 MG tablet Take 1 tablet (70 mg total) by mouth every 7 (seven) days. Take with a full glass of water on an empty stomach. 10/22/17   Breeback, Jade L, PA-C  ALPRAZolam (XANAX) 0.5 MG tablet TAKE 1 TABLET ONCE DAILY AS NEEDED FOR ANXIETY. 10/22/17   Breeback, Jade L, PA-C  cetirizine (ZYRTEC) 10 MG tablet Take 10 mg by mouth daily.    [provider]  Cholecalciferol (VITAMIN D) 2000 units CAPS Take by mouth.    [provider]  citalopram (CELEXA) 10 MG tablet TAKE (1) TABLET BY MOUTH ONCE DAILY. 07/22/17   Breeback, Jade L, PA-C  gabapentin (NEURONTIN) 100 MG capsule Take 1 capsule (100 mg total) by mouth 3 (three) times daily. 11/13/17   Merian Capron, MD  hydrocortisone-pramoxine [facility]) rectal foam Place 1 applicator rectally 2 (two) times daily. 01/10/18   Breeback, Royetta Car, PA-C  meloxicam (MOBIC) 15 MG tablet One tab PO qAM with breakfast for 2 weeks, then daily prn pain. 08/20/17   Silverio Decamp, MD  mirtazapine (REMERON) 15 MG tablet Take 1 tablet (15 mg total) by mouth at bedtime. 08/20/17   Silverio Decamp, MD  omeprazole (PRILOSEC) 20 MG capsule Take 20 mg by mouth daily.    [provider]  Probiotic Product (PROBIOTIC & ACIDOPHILUS EX ST PO) Take by mouth.    [provider]  Pseudoephedrine-Guaifenesin (MUCINEX D MAX STRENGTH PO)  Take by mouth every 12 (twelve) hours as needed.    [provider]    Family History Family History  Problem Relation Age of Onset  . Kidney cancer Mother 27  . COPD Mother   . Cancer Mother        renal  . Kidney disease Mother   . Allergic rhinitis Mother   . Asthma Mother   . Heart disease Father   . Allergic rhinitis Father   . COPD Sister   . Depression Sister   . Pancreatic cancer Sister   . Cancer Sister        pancreatic  . Heart disease Brother   . Heart attack Brother   .  Heart disease Paternal Uncle   . Diabetes Unknown        neice  . Thyroid disease Unknown        neice  . Depression Maternal Aunt   . Depression Maternal Grandfather   . Hypothyroidism Sister   . Hypothyroidism Sister   . Colon cancer Neg Hx   . Rectal cancer Neg Hx   . Stomach cancer Neg Hx   . Colon polyps Neg Hx     Social History Social History   Tobacco Use  . Smoking status: Never Smoker  . Smokeless tobacco: Never Used  Substance Use Topics  . Alcohol use: No  . Drug use: No     Allergies   Hydrocodone; Abilify [aripiprazole]; Codeine; Morphine and related; Percocet [oxycodone-acetaminophen]; and Vicodin [hydrocodone-acetaminophen]   Review of Systems Review of Systems  Constitutional: Positive for activity change, appetite change and fatigue. Negative for fever.  HENT: Negative for congestion and rhinorrhea.   Respiratory: Negative for cough, chest tightness and shortness of breath.   Cardiovascular: Negative for chest pain.  Gastrointestinal: Positive for abdominal pain and nausea. Negative for constipation, diarrhea and vomiting.  Genitourinary: Negative for dysuria, hematuria, vaginal bleeding and vaginal discharge.  Musculoskeletal: Negative for arthralgias and myalgias.  Skin: Negative for rash.  Neurological: Positive for dizziness, syncope, light-headedness and headaches. Negative for weakness.    all other systems are negative except as noted in  the HPI and PMH.   Physical Exam Updated Vital Signs BP 100/67 (BP Location: Right Arm)   Pulse 72   Temp 98.1 F (36.7 C) (Oral)   Resp 18   Ht 5\' 6"  (1.676 m)   Wt 50.3 kg   SpO2 100%   BMI 17.92 kg/m   Physical Exam  Constitutional: She is oriented to person, place, and time. She appears well-developed and well-nourished. No distress.  Nontoxic appearing  HENT:  Head: Normocephalic and atraumatic.  Mouth/Throat: Oropharynx is clear and moist. No oropharyngeal exudate.  Eyes: Pupils are equal, round, and reactive to light. Conjunctivae and EOM are normal.  Neck: Normal range of motion. Neck supple.  No meningismus.  Cardiovascular: Normal rate, regular rhythm, normal heart sounds and intact distal pulses.  No murmur heard. Pulmonary/Chest: Effort normal and breath sounds normal. No respiratory distress.  Abdominal: Soft. There is tenderness. There is guarding. There is no rebound.  Suprapubic and left lower quadrant tenderness with voluntary guarding and rebound  Genitourinary:  Genitourinary Comments: Chaperone present, no gross blood, no hemorrhoids  Musculoskeletal: Normal range of motion. She exhibits no edema or tenderness.  No CVA tenderness  Neurological: She is alert and oriented to person, place, and time. No cranial nerve deficit. She exhibits normal muscle tone. Coordination normal.  No ataxia on finger to nose bilaterally. No pronator drift. 5/5 strength throughout. CN 2-12 intact.Equal grip strength. Sensation intact.   Skin: Skin is warm.  Psychiatric: She has a normal mood and affect. Her behavior is normal.  Nursing note and vitals reviewed.    ED Treatments / Results  Labs (all labs ordered are listed, but only abnormal results are displayed) Labs Reviewed  CBC WITH DIFFERENTIAL/PLATELET  COMPREHENSIVE METABOLIC PANEL  LIPASE, BLOOD  TROPONIN I  TROPONIN I  I-STAT CG4 LACTIC ACID, ED  POC OCCULT BLOOD, ED  I-STAT CG4 LACTIC ACID, ED     EKG EKG Interpretation  Date/Time:  Saturday February 08 2018 05:36:45 EDT Ventricular Rate:  67 PR Interval:    QRS Duration: 101 QT  Interval:  387 QTC Calculation: 409 R Axis:   73 Text Interpretation:  Sinus rhythm RSR' in V1 or V2, probably normal variant No significant change was found Confirmed by Ezequiel Essex [phone]) on 02/08/2018 5:42:10 AM   Radiology Ct Head Wo Contrast  Result Date: 02/08/2018 CLINICAL DATA:  Patient became disoriented and passed out. Did not strike head. Encephalopathy. EXAM: CT HEAD WITHOUT CONTRAST TECHNIQUE: Contiguous axial images were obtained from the base of the skull through the vertex without intravenous contrast. COMPARISON:  MRI brain 08/16/2014 FINDINGS: Brain: No evidence of acute infarction, hemorrhage, hydrocephalus, extra-axial collection or mass lesion/mass effect. Vascular: Mild intracranial arterial calcifications are present. Skull: Calvarium appears intact. Sinuses/Orbits: Paranasal sinuses and mastoid air cells are clear. Other: None. IMPRESSION: No acute intracranial abnormality. Electronically Signed   By: Lucienne Capers M.D.   On: 02/08/2018 06:47    Procedures Procedures (including critical care time)  Medications Ordered in ED Medications  sodium chloride 0.9 % bolus 1,000 mL (1,000 mLs Intravenous New Bag/Given 02/08/18 0610)  sodium chloride 0.9 % bolus 1,000 mL (has no administration in time range)     Initial Impression / Assessment and Plan / ED Course  I have reviewed the triage vital signs and the nursing notes.  Pertinent labs & imaging results that were available during my care of the patient were reviewed by me and considered in my medical decision making (see chart for details).    Intermittent lower abdominal pain for several days similar to prior history of type colitis.  Syncopal episode today after trying to use the bathroom preceded by dizziness, lightheadedness and diaphoresis.  No chest pain.  EKG is  sinus rhythm without evidence of Brugada or prolonged QT.  Initial blood pressure of 79 systolic appears spurious.  Subsequent readings have been in the 390Z systolic.  Labs are reassuring.  Hemoccult negative.  Lactate normal.  CBC normal.  CT scan will be obtained to evaluate for diverticulitis or other intra-abdominal process.  Patient appears well and nontoxic.  CT head is negative.  EKG is sinus rhythm.  Second troponin will be obtained at 9 AM.  Disposition pending CT scan results. Dr. Thurnell Garbe to assume care at shift change.   Final Clinical Impressions(s) / ED Diagnoses   Final diagnoses:  Lower abdominal pain  Syncope, unspecified syncope type    ED Discharge Orders    None       Weslynn Ke, Annie Main, MD 02/08/18 [phone]

## 2018-02-08 NOTE — ED Notes (Signed)
Water and crackers given to pt and instructed to let RN know if she has any n/v or abdominal pain

## 2018-02-08 NOTE — ED Triage Notes (Signed)
Pt states she woke up to go to the BR and having severe lower mid abd pain, pt states she "started sweating, became disoriented and passed out", pt denies hitting head

## 2018-02-08 NOTE — ED Provider Notes (Addendum)
Pt received at sign out with CT A/P pending, 2nd trop pending. Pt presented with c/o lower abd pain for several days, had syncopal episode while urinating on toilet PTA. CT with mild acute diverticulitis. 2nd trop normal. WBC count and lactic acid normal. Pt remains afebrile. Pt has tol PO well while in the ED without N/V. Pt states she has had diverticulitis previously and would prefer to go home (vs admit). Low risk syncope score. Tx abx, pain/nausea meds, f/u PMD and GI MD. Dx and testing d/w pt and family.  Questions answered.  Verb understanding, agreeable to d/c home with outpt f/u.   BP 110/68   Pulse 65   Temp 98.1 F (36.7 C) (Oral)   Resp 16   Ht 5\' 6"  (1.676 m)   Wt 50.3 kg   SpO2 100%   BMI 17.92 kg/m    Results for orders placed or performed during the hospital encounter of 02/08/18  CBC with Differential/Platelet  Result Value Ref Range   WBC 7.5 4.0 - 10.5 K/uL   RBC 3.90 3.87 - 5.11 MIL/uL   Hemoglobin 12.1 12.0 - 15.0 g/dL   HCT 36.2 36.0 - 46.0 %   MCV 92.8 78.0 - 100.0 fL   MCH 31.0 26.0 - 34.0 pg   MCHC 33.4 30.0 - 36.0 g/dL   RDW 12.6 11.5 - 15.5 %   Platelets 270 150 - 400 K/uL   Neutrophils Relative % 78 %   Neutro Abs 5.8 1.7 - 7.7 K/uL   Lymphocytes Relative 12 %   Lymphs Abs 0.9 0.7 - 4.0 K/uL   Monocytes Relative 9 %   Monocytes Absolute 0.7 0.1 - 1.0 K/uL   Eosinophils Relative 1 %   Eosinophils Absolute 0.1 0.0 - 0.7 K/uL   Basophils Relative 0 %   Basophils Absolute 0.0 0.0 - 0.1 K/uL  Comprehensive metabolic panel  Result Value Ref Range   Sodium 140 135 - 145 mmol/L   Potassium 4.3 3.5 - 5.1 mmol/L   Chloride 105 98 - 111 mmol/L   CO2 30 22 - 32 mmol/L   Glucose, Bld 94 70 - 99 mg/dL   BUN 15 8 - 23 mg/dL   Creatinine, Ser 0.76 0.44 - 1.00 mg/dL   Calcium 9.0 8.9 - 10.3 mg/dL   Total Protein 6.6 6.5 - 8.1 g/dL   Albumin 3.7 3.5 - 5.0 g/dL   AST 16 15 - 41 U/L   ALT 11 0 - 44 U/L   Alkaline Phosphatase 39 38 - 126 U/L   Total  Bilirubin 0.6 0.3 - 1.2 mg/dL   GFR calc non Af Amer >60 >60 mL/min   GFR calc Af Amer >60 >60 mL/min   Anion gap 5 5 - 15  Lipase, blood  Result Value Ref Range   Lipase 32 11 - 51 U/L  Troponin I  Result Value Ref Range   Troponin I <0.03 <0.03 ng/mL  Troponin I  Result Value Ref Range   Troponin I <0.03 <0.03 ng/mL  I-Stat CG4 Lactic Acid, ED  Result Value Ref Range   Lactic Acid, Venous 0.97 0.5 - 1.9 mmol/L  POC occult blood, ED Provider will collect  Result Value Ref Range   Fecal Occult Bld NEGATIVE NEGATIVE   Ct Head Wo Contrast Result Date: 02/08/2018 CLINICAL DATA:  Patient became disoriented and passed out. Did not strike head. Encephalopathy. EXAM: CT HEAD WITHOUT CONTRAST TECHNIQUE: Contiguous axial images were obtained from the base of the skull  through the vertex without intravenous contrast. COMPARISON:  MRI brain 08/16/2014 FINDINGS: Brain: No evidence of acute infarction, hemorrhage, hydrocephalus, extra-axial collection or mass lesion/mass effect. Vascular: Mild intracranial arterial calcifications are present. Skull: Calvarium appears intact. Sinuses/Orbits: Paranasal sinuses and mastoid air cells are clear. Other: None. IMPRESSION: No acute intracranial abnormality. Electronically Signed   By: Lucienne Capers M.D.   On: 02/08/2018 06:47   Ct Abdomen Pelvis W Contrast Result Date: 02/08/2018 CLINICAL DATA:  Severe mid abdominal pain with diaphoresis since this morning. Syncope. EXAM: CT ABDOMEN AND PELVIS WITH CONTRAST TECHNIQUE: Multidetector CT imaging of the abdomen and pelvis was performed using the standard protocol following bolus administration of intravenous contrast. CONTRAST:  12mL ISOVUE-300 IOPAMIDOL (ISOVUE-300) INJECTION 61%, 38mL ISOVUE-300 IOPAMIDOL (ISOVUE-300) INJECTION 61% COMPARISON:  Chest CT 08/08/2016 and 12/17/2016 FINDINGS: Lower chest: 2-3 mm nodular density over the lingula unchanged. Hepatobiliary: There are a few subcentimeter hypodensities  too small to characterize but likely cysts or hemangiomas. The gallbladder and biliary tree are within normal. Pancreas: Normal. Spleen: Normal. Adrenals/Urinary Tract: Adrenal glands are normal. Kidneys are normal in size without nephrolithiasis or focal mass. There is mild symmetric prominence of the intrarenal collecting system and renal pelvis. Ureters are normal. Bladder is within normal. Stomach/Bowel: Stomach and small bowel are normal. Appendix is normal. There is diverticulosis of the colon. There is mild wall thickening with mild adjacent inflammatory change/free fluid involving a short segment of the sigmoid colon in the left pelvis. This is likely due to an inflamed diverticula compatible with mild acute diverticulitis. No evidence of perforation or abscess. Vascular/Lymphatic: Normal. Reproductive: Uterus is retroverted. There is a 9 mm fibroid over the fundus. Ovaries unremarkable. Other: None. Musculoskeletal: Mild degenerative change of the spine with disc disease at the L4-5 level. IMPRESSION: Diverticulosis of the colon with evidence of mild acute diverticulitis involving a short segment of sigmoid colon in the left pelvis. No diverticular abscess or perforation. Few subcentimeter liver hypodensities too small to characterize but likely cysts or hemangiomas. Retroverted uterus with 9 mm fundal fibroid. Electronically Signed   By: Marin Olp M.D.   On: 02/08/2018 09:08          Francine Graven, DO 02/08/18 1108

## 2018-02-08 NOTE — Discharge Instructions (Addendum)
Take the prescriptions as directed.  Eat a liquid diet for the next few days, slowly increase to a bland diet, and advance to your regular diet slowly as you can tolerate it.  Call your regular medical doctor and your GI doctor on Monday to schedule a follow up appointment this week.  Return to the Emergency Department immediately if not improving (or even worsening) despite taking the medicines as prescribed, any black or bloody stool or vomit, if you develop a fever over "101," or for any other concerns.

## 2018-02-12 NOTE — Procedures (Signed)
[place] A. Merlene Laughter, MD     [email or web address]             NOCTURNAL POLYSOMNOGRAPHY   LOCATION: [facility]   Patient Name: Kellie [name], Kellie [name] Date: 02/03/2018 Gender: Female D.O.B: 15-Oct-1955 Age (years): 61 Referring Provider: [name] Height (inches): 66 Interpreting Physician: Phillips Odor MD, ABSM Weight (lbs): 110 RPSGT: Rosebud Poles BMI: 18 MRN: [id number] Neck Size: 14.50 <br> <br> CLINICAL INFORMATION Sleep Study Type: NPSG    Indication for sleep study: N/A    Epworth Sleepiness Score: 9    SLEEP STUDY TECHNIQUE As per the AASM Manual for the Scoring of Sleep and Associated Events v2.3 (April 2016) with a hypopnea requiring 4% desaturations.  The channels recorded and monitored were frontal, central and occipital EEG, electrooculogram (EOG), submentalis EMG (chin), nasal and oral airflow, thoracic and abdominal wall motion, anterior tibialis EMG, snore microphone, electrocardiogram, and pulse oximetry.  MEDICATIONS Medications self-administered by patient taken the night of the study : N/A  Current Outpatient Medications:  .  acyclovir (ZOVIRAX) 200 MG capsule, Take 2 capsules (400 mg total) by mouth 2 (two) times daily., Disp: 60 capsule, Rfl: 11 .  alendronate (FOSAMAX) 70 MG tablet, Take 1 tablet (70 mg total) by mouth every 7 (seven) days. Take with a full glass of water on an empty stomach., Disp: 4 tablet, Rfl: 11 .  ALPRAZolam (XANAX) 0.5 MG tablet, TAKE 1 TABLET ONCE DAILY AS NEEDED FOR ANXIETY., Disp: 30 tablet, Rfl: 5 .  cetirizine (ZYRTEC) 10 MG tablet, Take 10 mg by mouth daily., Disp: , Rfl:  .  Cholecalciferol (VITAMIN D) 2000 units CAPS, Take by mouth., Disp: , Rfl:  .  ciprofloxacin (CIPRO) 500 MG tablet, Take 1 tablet (500 mg total) by mouth 2 (two) times daily., Disp: 14 tablet, Rfl: 0 .  citalopram (CELEXA) 10 MG tablet, TAKE (1) TABLET BY MOUTH ONCE DAILY., Disp: 30 tablet, Rfl: 5 .  gabapentin  (NEURONTIN) 100 MG capsule, Take 1 capsule (100 mg total) by mouth 3 (three) times daily., Disp: 90 capsule, Rfl: 2 .  HYDROcodone-acetaminophen (NORCO/VICODIN) 5-325 MG tablet, 1 or 2 tabs PO q8 hours prn pain, Disp: 12 tablet, Rfl: 0 .  hydrocortisone-pramoxine (PROCTOFOAM-HC) rectal foam, Place 1 applicator rectally 2 (two) times daily., Disp: 10 g, Rfl: 1 .  meloxicam (MOBIC) 15 MG tablet, One tab PO qAM with breakfast for 2 weeks, then daily prn pain., Disp: 30 tablet, Rfl: 3 .  metroNIDAZOLE (FLAGYL) 500 MG tablet, Take 1 tablet (500 mg total) by mouth 3 (three) times daily., Disp: 21 tablet, Rfl: 0 .  mirtazapine (REMERON) 15 MG tablet, Take 1 tablet (15 mg total) by mouth at bedtime., Disp: 30 tablet, Rfl: 3 .  omeprazole (PRILOSEC) 20 MG capsule, Take 20 mg by mouth daily., Disp: , Rfl:  .  Probiotic Product (PROBIOTIC & ACIDOPHILUS EX ST PO), Take by mouth., Disp: , Rfl:  .  promethazine (PHENERGAN) 12.5 MG tablet, Take 1 tablet (12.5 mg total) by mouth every 6 (six) hours as needed for nausea or vomiting., Disp: 8 tablet, Rfl: 0 .  Pseudoephedrine-Guaifenesin (MUCINEX D MAX STRENGTH PO), Take by mouth every 12 (twelve) hours as needed., Disp: , Rfl:     SLEEP ARCHITECTURE The study was initiated at 10:09:41 PM and ended at 5:09:17 AM.  Sleep onset time was 1.3 minutes and the sleep efficiency was 92.1%%. The total sleep time was 386.3 minutes.  Stage REM latency was 95.5 minutes.  The patient  spent 4.7%% of the night in stage N1 sleep, 54.5%% in stage N2 sleep, 22.1%% in stage N3 and 18.7% in REM.  Alpha intrusion was absent.  Supine sleep was 55.09%.  RESPIRATORY PARAMETERS The overall apnea/hypopnea index (AHI) was 3.7 per hour. There were 21 total apneas, including 17 obstructive, 0 central and 4 mixed apneas. There were 3 hypopneas and 1 RERAs.  The AHI during Stage REM sleep was 16.6 per hour.  AHI while supine was 6.8 per hour.  The mean oxygen saturation was 96.4%.  The minimum SpO2 during sleep was 90.0%.  moderate snoring was noted during this study.  CARDIAC DATA The 2 lead EKG demonstrated sinus rhythm. The mean heart rate was 57.7 beats per minute. Other EKG findings include: None. LEG MOVEMENT DATA The index was not generated by the computer but is estimated to be moderate in severity.  IMPRESSIONS  Moderate periodic limb movement disorder sleep is documented. Otherwise this recording is unremarkable.  Delano Metz, MD Diplomate, American Board of Sleep Medicine.  ELECTRONICALLY SIGNED ON:  02/12/2018, 2:06 PM [place] PH: [phone]   FX: [phone] [place]

## 2018-02-14 ENCOUNTER — Encounter (HOSPITAL_COMMUNITY): Payer: Self-pay | Admitting: Licensed Clinical Social Worker

## 2018-02-14 ENCOUNTER — Ambulatory Visit (INDEPENDENT_AMBULATORY_CARE_PROVIDER_SITE_OTHER): Payer: Medicare Other | Admitting: Licensed Clinical Social Worker

## 2018-02-14 DIAGNOSIS — F310 Bipolar disorder, current episode hypomanic: Secondary | ICD-10-CM | POA: Diagnosis not present
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
## 2018-03-08 ENCOUNTER — Other Ambulatory Visit: Payer: Self-pay | Admitting: Physician Assistant

## 2018-03-08 ENCOUNTER — Other Ambulatory Visit (HOSPITAL_COMMUNITY): Payer: Self-pay | Admitting: Psychiatry

## 2018-03-10 ENCOUNTER — Ambulatory Visit (INDEPENDENT_AMBULATORY_CARE_PROVIDER_SITE_OTHER): Payer: Medicare Other | Admitting: Physician Assistant

## 2018-03-10 ENCOUNTER — Encounter: Payer: Self-pay | Admitting: Physician Assistant

## 2018-03-10 VITALS — BP 104/64 | HR 76 | Ht 66.0 in | Wt 108.0 lb

## 2018-03-10 DIAGNOSIS — G4762 Sleep related leg cramps: Secondary | ICD-10-CM | POA: Diagnosis not present

## 2018-03-10 DIAGNOSIS — G4761 Periodic limb movement disorder: Secondary | ICD-10-CM

## 2018-03-10 DIAGNOSIS — R239 Unspecified skin changes: Secondary | ICD-10-CM | POA: Diagnosis not present

## 2018-03-10 DIAGNOSIS — M5030 Other cervical disc degeneration, unspecified cervical region: Secondary | ICD-10-CM

## 2018-03-10 DIAGNOSIS — R634 Abnormal weight loss: Secondary | ICD-10-CM

## 2018-03-10 DIAGNOSIS — F5101 Primary insomnia: Secondary | ICD-10-CM | POA: Diagnosis not present

## 2018-03-10 MED ORDER — MIRTAZAPINE 15 MG PO TABS: 15.0000 mg | ORAL_TABLET | Freq: Every day | ORAL | 3 refills | Status: DC

## 2018-03-10 MED ORDER — MELOXICAM 15 MG PO TABS: ORAL_TABLET | ORAL | 5 refills | Status: DC

## 2018-03-10 NOTE — Progress Notes (Signed)
l °

## 2018-03-10 NOTE — Patient Instructions (Addendum)
Referral to dermatology.  Start remeron.   Leg Cramps Leg cramps occur when a muscle or muscles tighten and you have no control over this tightening (involuntary muscle contraction). Muscle cramps can develop in any muscle, but the most common place is in the calf muscles of the leg. Those cramps can occur during exercise or when you are at rest. Leg cramps are painful, and they may last for a few seconds to a few minutes. Cramps may return several times before they finally stop. Usually, leg cramps are not caused by a serious medical problem. In many cases, the cause is not known. Some common causes include:  Overexertion.  Overuse from repetitive motions, or doing the same thing over and over.  Remaining in a certain position for a long period of time.  Improper preparation, form, or technique while performing a sport or an activity.  Dehydration.  Injury.  Side effects of some medicines.  Abnormally low levels of the salts and ions in your blood (electrolytes), especially potassium and calcium. These levels could be low if you are taking water pills (diuretics) or if you are pregnant.  Follow these instructions at home: Watch your condition for any changes. Taking the following actions may help to lessen any discomfort that you are feeling:  Stay well-hydrated. Drink enough fluid to keep your urine clear or pale yellow.  Try massaging, stretching, and relaxing the affected muscle. Do this for several minutes at a time.  For tight or tense muscles, use a warm towel, heating pad, or hot shower water directed to the affected area.  If you are sore or have pain after a cramp, applying ice to the affected area may relieve discomfort. ? Put ice in a plastic bag. ? Place a towel between your skin and the bag. ? Leave the ice on for 20 minutes, 2-3 times per day.  Avoid strenuous exercise for several days if you have been having frequent leg cramps.  Make sure that your diet  includes the essential minerals for your muscles to work normally.  Take medicines only as directed by your health care provider.  Contact a health care provider if:  Your leg cramps get more severe or more frequent, or they do not improve over time.  Your foot becomes cold, numb, or blue. This information is not intended to replace advice given to you by your health care provider. Make sure you discuss any questions you have with your health care provider. Document Released: 07/19/2004 Document Revised: 11/17/2015 Document Reviewed: 05/19/2014 Elsevier Interactive Patient Education  2018 [employer].  Muscle Cramps and Spasms Muscle cramps and spasms are when muscles tighten by themselves. They usually get better within minutes. Muscle cramps are painful. They are usually stronger and last longer than muscle spasms. Muscle spasms may or may not be painful. They can last a few seconds or much longer. Follow these instructions at home:  Drink enough fluid to keep your pee (urine) clear or pale yellow.  Massage, stretch, and relax the muscle.  If directed, apply heat to tight or tense muscles as often as told by your doctor. Use the heat source that your doctor recommends. ? Place a towel between your skin and the heat source. ? Leave the heat on for 20-30 minutes. ? Take off the heat if your skin turns bright red. This is especially important if you are unable to feel pain, heat, or cold. You may have a greater risk of getting burned.  If directed,  put ice on the affected area. This may help if you are sore or have pain after a cramp or spasm. ? Put ice in a plastic bag. ? Place a towel between your skin and the bag. ? Leave the ice on for 20 minutes, 2-3 times a day.  Take over-the-counter and prescription medicines only as told by your doctor.  Pay attention to any changes in your symptoms. Contact a doctor if:  Your cramps or spasms get worse or happen more often.  Your  cramps or spasms do not get better with time. This information is not intended to replace advice given to you by your health care provider. Make sure you discuss any questions you have with your health care provider. Document Released: 05/24/2008 Document Revised: 07/13/2015 Document Reviewed: 03/15/2015 Elsevier Interactive Patient Education  2018 [employer].

## 2018-03-13 DIAGNOSIS — G4761 Periodic limb movement disorder: Secondary | ICD-10-CM | POA: Insufficient documentation

## 2018-03-13 DIAGNOSIS — G4762 Sleep related leg cramps: Secondary | ICD-10-CM | POA: Insufficient documentation

## 2018-03-13 DIAGNOSIS — H02831 Dermatochalasis of right upper eyelid: Secondary | ICD-10-CM | POA: Insufficient documentation

## 2018-03-13 DIAGNOSIS — R239 Unspecified skin changes: Secondary | ICD-10-CM | POA: Insufficient documentation

## 2018-03-13 NOTE — Progress Notes (Signed)
Subjective:    Patient ID: Kellie [name], female    DOB: 03-Oct-1955, 62 y.o.   MRN: [id number]  HPI Pt is a 62 yo female with bipolar, GERD, cervical and lumbar DDD who presents to the clinic to go over sleep study.   She is also having more upper to mid back pain. She has cervical DDD. Her muscles get tight and then she has problems. PT helped in the past. Request PT to be ordered. No radicular symptoms. Takes mobic daily.   Pt still struggles to sleep. Never tried remeron. Pt has a lot of leg cramps every night. Admits to not drinking enough water.   Pt request dermatologist for "skin changes".   .. Active Ambulatory Problems    Diagnosis Date Noted  . GERD (gastroesophageal reflux disease) 09/27/2010  . Allergic rhinitis 11/08/2010  . Insomnia 11/08/2010  . Osteoporosis 11/21/2011  . Borderline intellectual functioning 02/15/2012  . Genital herpes 11/05/2012  . Hemorrhoids 11/05/2012  . Chronic periodontal disease 06/30/2013  . Adhesive capsulitis of right shoulder 11/24/2013  . Encounter for gynecological examination with Papanicolaou smear of cervix 12/08/2013  . Prediabetes 12/14/2013  . Postmenopausal atrophic vaginitis 03/17/2014  . Bipolar disorder with depression ([place]) 06/02/2014  . Family history of renal cancer 10/11/2014  . DDD (degenerative disc disease), lumbar 10/11/2014  . Rhinitis, allergic 10/14/2014  . Near syncope 02/09/2015  . Chronic nasal congestion 03/01/2015  . Vitreous floaters of right eye 03/01/2015  . DDD (degenerative disc disease), cervical 07/05/2015  . Cough, persistent 07/21/2015  . Post-nasal drip 07/21/2015  . Left knee pain 11/09/2015  . Pain in joint, lower leg 11/09/2015  . Plantar fasciitis of left foot 11/09/2015  . Hoarseness 03/09/2016  . Pulmonary nodule 08/08/2016  . Bipolar 1 disorder with moderate mania ([place]) 09/17/2016  . Panic attacks 09/18/2016  . SOB (shortness of breath) 09/18/2016  . Weakness 10/05/2016  . Family  history of pancreatic cancer 02/18/2017  . Diarrhea 02/18/2017  . Loss of weight 02/18/2017  . Unintentional weight loss 05/18/2017  . Elevated LDL cholesterol level 05/18/2017  . Hyperlipidemia 05/18/2017  . Irritable bowel syndrome with both constipation and diarrhea 05/18/2017  . History of loop electrical excision procedure (LEEP) 07/01/2017  . Atypical chest pain 10/22/2017  . Anxiety 10/22/2017  . Aortic insufficiency 01/10/2018  . Non-restorative sleep 01/10/2018  . Snoring 01/10/2018  . Recent skin changes 03/13/2018   Resolved Ambulatory Problems    Diagnosis Date Noted  . Sinusitis 09/27/2010  . Depression 09/27/2010  . Bronchitis 12/04/2010  . Cough 06/02/2011  . General medical examination 10/18/2011  . Dizziness 11/21/2011  . Knee pain 11/21/2011  . Low back pain 11/21/2011  . Heel pain 07/14/2012  . Thoracic myofascial strain 03/13/2013  . Sinusitis 04/20/2013  . Cough 04/22/2013  . Rotator cuff disorder 08/01/2013  . Right shoulder pain 10/27/2013  . Adhesive capsulitis 11/02/2013  . Pain in joint, shoulder region 11/24/2013  . Bipolar disorder ([place]) 04/29/2014  . Right serous otitis media 08/03/2014  . Acute recurrent maxillary sinusitis 10/14/2014  . Right-sided low back pain without sciatica 10/14/2014  . Sinusitis 02/09/2015  . Sacroiliac joint dysfunction of right side 02/25/2015  . Piriformis syndrome 03/01/2015  . Adhesive capsulitis 07/05/2015  . Radiculitis of right cervical region 07/06/2015  . Drug reaction 09/25/2016  . Mitral valve prolapse 01/10/2018   Past Medical History:  Diagnosis Date  . Abnormal Pap smear of cervix   . Allergic rhinitis 11/08/2010  .  Asthma   . Herpes simplex of female genitalia   . Urticaria      Review of Systems  All other systems reviewed and are negative.      Objective:   Physical Exam  Constitutional: She is oriented to person, place, and time. She appears well-developed and well-nourished.  HENT:   Head: Normocephalic and atraumatic.  Cardiovascular: Normal rate, regular rhythm and normal heart sounds.  Pulmonary/Chest: Effort normal and breath sounds normal.  Musculoskeletal:  Lots of paraspinal, rhomboid, trapzius tightness.   Neurological: She is alert and oriented to person, place, and time.  Psychiatric: She has a normal mood and affect. Her behavior is normal.          Assessment & Plan:  [name] KitchenMarland KitchenDiagnoses and all orders for this visit:  Periodic limb movement  DDD (degenerative disc disease), cervical -     meloxicam (MOBIC) 15 MG tablet; One tab PO qAM with breakfast for 2 weeks, then daily prn pain. -     Ambulatory referral to Physical Therapy  Unintentional weight loss -     mirtazapine (REMERON) 15 MG tablet; Take 1 tablet (15 mg total) by mouth at bedtime.  Recent skin changes -     Ambulatory referral to Dermatology  Nocturnal leg cramps  Primary insomnia -     mirtazapine (REMERON) 15 MG tablet; Take 1 tablet (15 mg total) by mouth at bedtime.    Sleep results: Moderate periodic limb movement disorder sleep is documented. Otherwise this recording is unremarkable. ressured pt she did not need CPAP. Discussed medications she would like to continue with magnesium for now. Could try reqiup.  HO given on cramp. STAY hydrated.   For sleep start remeron.  PT really helped with DDD. She would like another round and then she wants to make sure she keeps her with exercises on her own.   Referral for skin changes for dermatology.   Follow up in 2-3 months.

## 2018-03-14 ENCOUNTER — Encounter (HOSPITAL_COMMUNITY): Payer: Self-pay | Admitting: Psychiatry

## 2018-03-14 ENCOUNTER — Other Ambulatory Visit: Payer: Self-pay | Admitting: Physician Assistant

## 2018-03-14 ENCOUNTER — Ambulatory Visit (INDEPENDENT_AMBULATORY_CARE_PROVIDER_SITE_OTHER): Payer: Medicare Other | Admitting: Psychiatry

## 2018-03-14 VITALS — BP 104/62 | HR 76 | Ht 66.0 in | Wt 109.0 lb

## 2018-03-14 DIAGNOSIS — Z1231 Encounter for screening mammogram for malignant neoplasm of breast: Secondary | ICD-10-CM

## 2018-03-14 DIAGNOSIS — F310 Bipolar disorder, current episode hypomanic: Secondary | ICD-10-CM | POA: Diagnosis not present

## 2018-03-14 DIAGNOSIS — Z818 Family history of other mental and behavioral disorders: Secondary | ICD-10-CM | POA: Diagnosis not present

## 2018-03-14 DIAGNOSIS — F5102 Adjustment insomnia: Secondary | ICD-10-CM | POA: Diagnosis not present

## 2018-03-14 DIAGNOSIS — F411 Generalized anxiety disorder: Secondary | ICD-10-CM

## 2018-03-14 MED ORDER — GABAPENTIN 100 MG PO CAPS
ORAL_CAPSULE | ORAL | 4 refills | Status: DC
Start: 2018-03-14 — End: 2018-09-09

## 2018-03-14 MED ORDER — CITALOPRAM HYDROBROMIDE 10 MG PO TABS: ORAL_TABLET | ORAL | 3 refills | Status: DC

## 2018-03-14 NOTE — Progress Notes (Signed)
Patient ID: Kellie [name], female   DOB: 26-Jun-1955, 62 y.o.   MRN: [id number]   [facility] Follow up Outpatient visit  Kellie [name] [id number] 63 y.o.  03/14/2018 10:33 AM  Chief Complaint:   Follow up for Bipolar  And anxiety.  History of Present Illness:   Doing fair. Mood not subdued and less cycling. Tolerating meds. remeron has helped apetite but still not gaining weight  Gabapentin helps mood, bipolar and some anxiety May take herself off from xanax  Goes to gym to tone  Also on celexa.  Anxiety more during evening Worried about age, finances    Past Psychiatric History/Hospitalization(s) denies  Hospitalization for psychiatric illness: No History of Electroconvulsive Shock Therapy: No Prior Suicide Attempts: No  Medical History; Past Medical History:  Diagnosis Date  . Abnormal Pap smear of cervix   . Allergic rhinitis 11/08/2010  . Anxiety   . Asthma   . Bronchitis   . Herpes simplex of female genitalia   . Hyperlipidemia   . Mitral valve prolapse   . Osteoporosis 11/21/2011  . Urticaria     Allergies: Allergies  Allergen Reactions  . Hydrocodone Nausea And Vomiting  . Abilify [Aripiprazole]     Nausea/vomiting/weakness/shaky  . Codeine Nausea And Vomiting  . Morphine And Related Nausea And Vomiting  . Percocet [Oxycodone-Acetaminophen] Nausea And Vomiting and Other (See Comments)    Patient states it makes blood pressure drop too much  . Vicodin [Hydrocodone-Acetaminophen] Nausea And Vomiting    Medications: Outpatient Encounter Medications as of 03/14/2018  Medication Sig  . acyclovir (ZOVIRAX) 200 MG capsule Take 2 capsules (400 mg total) by mouth 2 (two) times daily.  [name] Kitchen alendronate (FOSAMAX) 70 MG tablet Take 1 tablet (70 mg total) by mouth every 7 (seven) days. Take with a full glass of water on an empty stomach.  . ALPRAZolam (XANAX) 0.5 MG tablet TAKE 1 TABLET ONCE DAILY AS NEEDED FOR ANXIETY.  . cetirizine (ZYRTEC) 10 MG  tablet Take 10 mg by mouth daily.  . Cholecalciferol (VITAMIN D) 2000 units CAPS Take by mouth.  . citalopram (CELEXA) 10 MG tablet TAKE (1) TABLET BY MOUTH ONCE DAILY.  [name] Kitchen gabapentin (NEURONTIN) 100 MG capsule TAKE (1) CAPSULE BY MOUTH THREE TIMES DAILY.  . hydrocortisone-pramoxine (PROCTOFOAM-HC) rectal foam Place 1 applicator rectally 2 (two) times daily.  . Magnesium 250 MG TABS Take 1 tablet by mouth daily.  . meloxicam (MOBIC) 15 MG tablet One tab PO qAM with breakfast for 2 weeks, then daily prn pain.  . mirtazapine (REMERON) 15 MG tablet Take 1 tablet (15 mg total) by mouth at bedtime.  [name] Kitchen omeprazole (PRILOSEC) 20 MG capsule Take 20 mg by mouth daily.  . Probiotic Product (PROBIOTIC & ACIDOPHILUS EX ST PO) Take by mouth.  . promethazine (PHENERGAN) 12.5 MG tablet Take 1 tablet (12.5 mg total) by mouth every 6 (six) hours as needed for nausea or vomiting.  . [DISCONTINUED] citalopram (CELEXA) 10 MG tablet TAKE (1) TABLET BY MOUTH ONCE DAILY.  . [DISCONTINUED] gabapentin (NEURONTIN) 100 MG capsule TAKE (1) CAPSULE BY MOUTH THREE TIMES DAILY.  [name] Kitchen Pseudoephedrine-Guaifenesin (MUCINEX D MAX STRENGTH PO) Take by mouth every 12 (twelve) hours as needed.   No facility-administered encounter medications on file as of 03/14/2018.      Family History; Family History  Problem Relation Age of Onset  . Kidney cancer Mother 71  . COPD Mother   . Cancer Mother        renal  .  Kidney disease Mother   . Allergic rhinitis Mother   . Asthma Mother   . Heart disease Father   . Allergic rhinitis Father   . COPD Sister   . Depression Sister   . Pancreatic cancer Sister   . Cancer Sister        pancreatic  . Heart disease Brother   . Heart attack Brother   . Heart disease Paternal Uncle   . Diabetes Unknown        neice  . Thyroid disease Unknown        neice  . Depression Maternal Aunt   . Depression Maternal Grandfather   . Hypothyroidism Sister   . Hypothyroidism Sister   . Colon cancer  Neg Hx   . Rectal cancer Neg Hx   . Stomach cancer Neg Hx   . Colon polyps Neg Hx        Labs:  Recent Results (from the past 2160 hour(s))  Exercise Tolerance Test     Status: None   Collection Time: 01/07/18 11:28 AM  Result Value Ref Range   Rest HR 63 bpm   Rest BP 113/72 mmHg   RPE 18    Exercise duration (sec) 0 sec   Percent HR 92 %   Exercise duration (min) 8 min   Estimated workload 10.1 METS   Peak HR 146 bpm   Peak BP 133/62 mmHg   MPHR 157 bpm  Lipid Panel w/reflex Direct LDL     Status: Abnormal   Collection Time: 01/13/18  8:13 AM  Result Value Ref Range   Cholesterol 230 (H) <200 mg/dL   HDL 103 >50 mg/dL   Triglycerides 52 <150 mg/dL   LDL Cholesterol (Calc) 114 (H) mg/dL (calc)    Comment: Reference range: <100 . Desirable range <100 mg/dL for primary prevention;   <70 mg/dL for patients with CHD or diabetic patients  with > or = 2 CHD risk factors. [name] Kitchen LDL-C is now calculated using the Martin-Hopkins  calculation, which is a validated novel method providing  better accuracy than the Friedewald equation in the  estimation of LDL-C.  Cresenciano Genre et al. Annamaria Helling. 0037;048(88): 2061-2068  (http://education.[employer].com/faq/FAQ164)    Total CHOL/HDL Ratio 2.2 <5.0 (calc)   Non-HDL Cholesterol (Calc) 127 <130 mg/dL (calc)    Comment: For patients with diabetes plus 1 major ASCVD risk  factor, treating to a non-HDL-C goal of <100 mg/dL  (LDL-C of <70 mg/dL) is considered a therapeutic  option.   COMPLETE METABOLIC PANEL WITH GFR     Status: None   Collection Time: 01/13/18  8:13 AM  Result Value Ref Range   Glucose, Bld 90 65 - 99 mg/dL    Comment: .            Fasting reference interval .    BUN 17 7 - 25 mg/dL   Creat 0.79 0.50 - 0.99 mg/dL    Comment: For patients >88 years of age, the reference limit for Creatinine is approximately 13% higher for people identified as African-American. .    GFR, Est Non African American 80 > OR = 60  mL/min/1.57m   GFR, Est African American 92 > OR = 60 mL/min/1.732m  BUN/Creatinine Ratio NOT APPLICABLE 6 - 22 (calc)   Sodium 139 135 - 146 mmol/L   Potassium 4.5 3.5 - 5.3 mmol/L   Chloride 102 98 - 110 mmol/L   CO2 31 20 - 32 mmol/L   Calcium 10.1 8.6 - 10.4  mg/dL   Total Protein 7.0 6.1 - 8.1 g/dL   Albumin 4.8 3.6 - 5.1 g/dL   Globulin 2.2 1.9 - 3.7 g/dL (calc)   AG Ratio 2.2 1.0 - 2.5 (calc)   Total Bilirubin 0.5 0.2 - 1.2 mg/dL   Alkaline phosphatase (APISO) 50 33 - 130 U/L   AST 18 10 - 35 U/L   ALT 12 6 - 29 U/L  TSH     Status: None   Collection Time: 01/13/18  8:13 AM  Result Value Ref Range   TSH 3.26 0.40 - 4.50 mIU/L  Hemoglobin A1c     Status: None   Collection Time: 01/13/18  8:13 AM  Result Value Ref Range   Hgb A1c MFr Bld 5.6 <5.7 % of total Hgb    Comment: For the purpose of screening for the presence of diabetes: . <5.7%       Consistent with the absence of diabetes 5.7-6.4%    Consistent with increased risk for diabetes             (prediabetes) > or =6.5%  Consistent with diabetes . This assay result is consistent with a decreased risk of diabetes. . Currently, no consensus exists regarding use of hemoglobin A1c for diagnosis of diabetes in children. . According to American Diabetes Association (ADA) guidelines, hemoglobin A1c <7.0% represents optimal control in non-pregnant diabetic patients. Different metrics may apply to specific patient populations.  Standards of Medical Care in Diabetes(ADA). .    Mean Plasma Glucose 114 (calc)   eAG (mmol/L) 6.3 (calc)  CBC with Differential/Platelet     Status: None   Collection Time: 02/08/18  5:59 AM  Result Value Ref Range   WBC 7.5 4.0 - 10.5 K/uL   RBC 3.90 3.87 - 5.11 MIL/uL   Hemoglobin 12.1 12.0 - 15.0 g/dL   HCT 36.2 36.0 - 46.0 %   MCV 92.8 78.0 - 100.0 fL   MCH 31.0 26.0 - 34.0 pg   MCHC 33.4 30.0 - 36.0 g/dL   RDW 12.6 11.5 - 15.5 %   Platelets 270 150 - 400 K/uL   Neutrophils  Relative % 78 %   Neutro Abs 5.8 1.7 - 7.7 K/uL   Lymphocytes Relative 12 %   Lymphs Abs 0.9 0.7 - 4.0 K/uL   Monocytes Relative 9 %   Monocytes Absolute 0.7 0.1 - 1.0 K/uL   Eosinophils Relative 1 %   Eosinophils Absolute 0.1 0.0 - 0.7 K/uL   Basophils Relative 0 %   Basophils Absolute 0.0 0.0 - 0.1 K/uL    Comment: Performed at [facility], [address] [place], [place] [address]  Comprehensive metabolic panel     Status: None   Collection Time: 02/08/18  5:59 AM  Result Value Ref Range   Sodium 140 135 - 145 mmol/L   Potassium 4.3 3.5 - 5.1 mmol/L   Chloride 105 98 - 111 mmol/L   CO2 30 22 - 32 mmol/L   Glucose, Bld 94 70 - 99 mg/dL   BUN 15 8 - 23 mg/dL   Creatinine, Ser 0.76 0.44 - 1.00 mg/dL   Calcium 9.0 8.9 - 10.3 mg/dL   Total Protein 6.6 6.5 - 8.1 g/dL   Albumin 3.7 3.5 - 5.0 g/dL   AST 16 15 - 41 U/L   ALT 11 0 - 44 U/L   Alkaline Phosphatase 39 38 - 126 U/L   Total Bilirubin 0.6 0.3 - 1.2 mg/dL   GFR calc non  Af Amer >60 >60 mL/min   GFR calc Af Amer >60 >60 mL/min    Comment: (NOTE) The eGFR has been calculated using the CKD EPI equation. This calculation has not been validated in all clinical situations. eGFR's persistently <60 mL/min signify possible Chronic Kidney Disease.    Anion gap 5 5 - 15    Comment: Performed at [facility], [address] [place], [place] [address]  Lipase, blood     Status: None   Collection Time: 02/08/18  5:59 AM  Result Value Ref Range   Lipase 32 11 - 51 U/L    Comment: Performed at [facility], [address], [place], [place] [address]  Troponin I     Status: None   Collection Time: 02/08/18  5:59 AM  Result Value Ref Range   Troponin I <0.03 <0.03 ng/mL    Comment: Performed at [facility], [address] [place], [place] [address]  I-Stat CG4 Lactic Acid, ED     Status: None   Collection Time: 02/08/18  6:07 AM  Result Value Ref Range   Lactic Acid, Venous 0.97 0.5 - 1.9 mmol/L  POC occult blood, ED Provider  will collect     Status: None   Collection Time: 02/08/18  6:08 AM  Result Value Ref Range   Fecal Occult Bld NEGATIVE NEGATIVE  Troponin I     Status: None   Collection Time: 02/08/18  9:10 AM  Result Value Ref Range   Troponin I <0.03 <0.03 ng/mL    Comment: Performed at [facility], [address], [place], [place] [address]     Mental Status Examination;   Psychiatric Specialty Exam: Physical Exam  Constitutional: She appears well-developed and well-nourished.  Skin: She is not diaphoretic.    Review of Systems  Constitutional: Negative for fever.  Cardiovascular: Negative for chest pain.  Skin: Negative for rash.  Neurological: Negative for tremors and headaches.  Psychiatric/Behavioral: Negative for depression and suicidal ideas.    Blood pressure 104/62, pulse 76, height 5' 6" (1.676 m), weight 109 lb (49.4 kg), SpO2 96 %.Body mass index is 17.59 kg/m.  General Appearance: Casual  Eye Contact::  Fair  Speech:  coherent  Volume:  Normal  Mood: fair  Affect: reactive  Thought Process: clear . No psychosis  Orientation:  Full (Time, Place, and Person)  Thought Content:  Rumination  Suicidal Thoughts:  No  Homicidal Thoughts:  No  Memory:  Immediate;   Fair Recent;   Fair  Judgement:  Fair  Insight:  Shallow  Psychomotor Activity:  Increased  Concentration:  Fair  Recall:  Fair  Akathisia:  Negative  Handed:  Right  AIMS (if indicated):     Assets:  Desire for Improvement Physical Health Transportation  Sleep:        Assessment: Axis I: bipolar disorder II unspecified or depressed type. Anxiety disorder NOS. Insomnia  Axis II: deferred  Axis III:  Past Medical History:  Diagnosis Date  . Abnormal Pap smear of cervix   . Allergic rhinitis 11/08/2010  . Anxiety   . Asthma   . Bronchitis   . Herpes simplex of female genitalia   . Hyperlipidemia   . Mitral valve prolapse   . Osteoporosis 11/21/2011  . Urticaria     Axis IV:  psychosocial   Treatment Plan and Summary:  Bipolar :some balanced, continue gabapentin.  GAD: not worse. Continue celexa, gabapentin  Insomnia: fair. Continue remeron.. Work on sleep hygiene Reviewed meds  Questions addressed FU 4-5 months.  Merian Capron, MD 03/14/2018

## 2018-03-19 ENCOUNTER — Telehealth: Payer: Self-pay

## 2018-03-19 DIAGNOSIS — Z1231 Encounter for screening mammogram for malignant neoplasm of breast: Secondary | ICD-10-CM

## 2018-03-19 DIAGNOSIS — M79604 Pain in right leg: Secondary | ICD-10-CM

## 2018-03-19 DIAGNOSIS — M79605 Pain in left leg: Principal | ICD-10-CM

## 2018-03-19 MED ORDER — FLUTICASONE PROPIONATE 50 MCG/ACT NA SUSP: 2.0000 | Freq: Every day | NASAL | 6 refills | Status: DC

## 2018-03-19 NOTE — Telephone Encounter (Signed)
Kellie [name] called this morning saying that she would like a prescription for Flonase sent to her pharmacy if that was possible. Her allergies have been bothering her. She also states that her legs are still feeling very sore. She states she has done everything you told her to do, but her legs are still hurting. Please advise. Thanks!

## 2018-03-19 NOTE — Telephone Encounter (Signed)
Flonase sent.  Would you like to try requip for restless leg and see if helps with cramps as well?

## 2018-03-20 ENCOUNTER — Encounter: Payer: Self-pay | Admitting: Physical Therapy

## 2018-03-20 ENCOUNTER — Ambulatory Visit: Payer: Medicare Other | Attending: Physician Assistant | Admitting: Physical Therapy

## 2018-03-20 DIAGNOSIS — M6281 Muscle weakness (generalized): Secondary | ICD-10-CM | POA: Insufficient documentation

## 2018-03-20 DIAGNOSIS — M545 Low back pain: Secondary | ICD-10-CM | POA: Insufficient documentation

## 2018-03-20 DIAGNOSIS — M546 Pain in thoracic spine: Secondary | ICD-10-CM | POA: Diagnosis not present

## 2018-03-20 DIAGNOSIS — R293 Abnormal posture: Secondary | ICD-10-CM | POA: Diagnosis not present

## 2018-03-20 NOTE — Therapy (Signed)
[facility] [address], [place], [place], [address] Phone: [phone]   Fax:  [phone]  Physical Therapy Evaluation  Patient Details  Name: Kellie [name] MRN: [id number] Date of Birth: 1955/09/27 Referring Provider: [name], PA-C   Encounter Date: 03/20/2018  PT End of Session - 03/20/18 0923    Visit Number  1    Date for PT Re-Evaluation  05/01/18    Authorization Type  Medicare A and B    PT Start Time  0846    PT Stop Time  0925    PT Time Calculation (min)  39 min    Activity Tolerance  No increased pain;Patient tolerated treatment well    Behavior During Therapy  [facility] for tasks assessed/performed       Past Medical History:  Diagnosis Date  . Abnormal Pap smear of cervix   . Allergic rhinitis 11/08/2010  . Anxiety   . Asthma   . Bronchitis   . Herpes simplex of female genitalia   . Hyperlipidemia   . Mitral valve prolapse   . Osteoporosis 11/21/2011  . Urticaria     Past Surgical History:  Procedure Laterality Date  . CLOSED MANIPULATION SHOULDER  7&01/2005   x2, 30 days apart  . CLOSED MANIPULATION SHOULDER  2006   Left  . COLONOSCOPY  09/2012   Medium sized external hemorrhoids, few small divertic in left colon, otherwise normal colon (repeat 10 yrs)  . ESOPHAGOGASTRODUODENOSCOPY  09/2012   Normal (Dr. Ardis Hughs)  . LEEP  11/2004    There were no vitals filed for this visit.   Subjective Assessment - 03/20/18 0849    Subjective  Pt reports that she has been having low back pain for several weeks. She has not been keeping up with her exercises as much as she normally does because she just doesn't have the energy.     Pertinent History  anxiety, asthma     Patient Stated Goals  decrease back pain     Currently in Pain?  Yes    Pain Score  8     Pain Location  Thoracic   down to lumbar spine    Pain Orientation  Mid;Lower    Pain Descriptors / Indicators  Aching;Sore    Pain Type   Other (Comment)   acute on chronic    Pain Radiating Towards  none     Pain Onset  1 to 4 weeks ago    Pain Frequency  Constant    Aggravating Factors   squatting and bending over, lifting case of water     Pain Relieving Factors  rest and Aleve    Effect of Pain on Daily Activities  difficulty with some daily activity          [facility] PT Assessment - 03/20/18 0001      Assessment   Medical Diagnosis  DDD    Referring Provider  [name], PA-C    Onset Date/Surgical Date  --   3-4 weeks ago   Prior Therapy  ~2 months ago for same       Precautions   Precautions  None      Balance Screen   Has the patient fallen in the past 6 months  No    Has the patient had a decrease in activity level because of a fear of falling?   No    Is the patient reluctant to leave their home because of a fear  of falling?   No      Home Film/video editor residence    Living Arrangements  Alone      Prior Function   Vocation  On disability      ROM / Strength   AROM / PROM / Strength  AROM;Strength      AROM   AROM Assessment Site  Lumbar;Thoracic    Lumbar Flexion  limited 50%, pt reports dizziness    Lumbar Extension  limited 75% pain end range in low back     Lumbar - Right Rotation  limited 75%, pulling opposite side    Lumbar - Left Rotation  3 weeks ago    Thoracic - Right Rotation  30 deg     Thoracic - Left Rotation  20 deg      Strength   Strength Assessment Site  Hip;Knee;Ankle    Right/Left Hip  Right;Left    Right Hip Flexion  3/5    Right Hip Extension  3/5    Right Hip ABduction  3+/5    Left Hip Flexion  3/5    Left Hip Extension  4/5    Left Hip ABduction  3+/5    Right/Left Knee  Right;Left    Right Knee Flexion  4/5    Right Knee Extension  5/5    Left Knee Flexion  4/5    Left Knee Extension  5/5    Right/Left Ankle  Right;Left    Right Ankle Dorsiflexion  5/5    Left Ankle Dorsiflexion  5/5      Flexibility   Soft Tissue  Assessment /Muscle Length  yes    Hamstrings  WNL    Quadriceps  WNL      Palpation   Palpation comment  tenderness along lumbar and thoracic paraspinals, hypomobile throughout       Ambulation/Gait   Gait Comments  (+) Lt foot inversion, (+) arch collapse BLE      High Level Balance   High Level Balance Comments  SLS Lt (+) trendelenburg, 10 sec and increased posture sway, Rt (+) trendelenburg, 5 sec                Objective measurements completed on examination: See above findings.      [facility] Adult PT Treatment/Exercise - 03/20/18 0001      Exercises   Exercises  Lumbar      Lumbar Exercises: Stretches   Other Lumbar Stretch Exercise  thoracic rotation open book stretch x10 reps each       Lumbar Exercises: Supine   Bridge  15 reps    Other Supine Lumbar Exercises  hooklying low trunk rotation x10 reps each side             PT Education - 03/20/18 1033    Education Details  eval findings/POC; implemented and reviewed HEP    Person(s) Educated  Patient    Methods  Explanation;Verbal cues;Handout    Comprehension  Verbalized understanding;Returned demonstration       PT Short Term Goals - 03/20/18 1038      PT SHORT TERM GOAL #1   Title  Pt will be independent with her initial HEP to improve flexibility and decrease pain.     Time  3    Period  Weeks    Status  New    Target Date  04/10/18      PT SHORT TERM GOAL #2   Title  Pt  will report atleast 25% reduction in her pain with daily activity.     Time  3    Period  Weeks    Status  New      PT SHORT TERM GOAL #3   Title  Pt will demo proper set up and use of lumbar roll while sitting, to improve posture and body mechanics throughout the day.    Time  3    Period  Weeks    Status  New        PT Long Term Goals - 03/20/18 1039      PT LONG TERM GOAL #1   Title  Pt will be independent with her advanced HEP to allow for safe transition to a gym program for maintenance after d/c.      Time  6    Period  Weeks    Status  New    Target Date  05/01/18      PT LONG TERM GOAL #2   Title  Pt will report atleast 50% reduction in her mid/low back pain to allow her to return to exercise at the gym.    Time  6    Period  Weeks    Status  New      PT LONG TERM GOAL #3   Title  Pt will have improved BLE strength to atleast 4/5 MMT which will improve her efficiency with activity at home.    Time  6    Period  Weeks    Status  New      PT LONG TERM GOAL #4   Title  Pt will have improved thoracic rotation ROM to atleast 45 deg bilaterally, which will improve her ability to complete daily tasks without compensation in the cervical and lumbar spine.     Time  6    Period  Weeks    Status  New             Plan - 03/20/18 6440    Clinical Impression Statement  Pt is a pleasant 62 y.o F referred to OPPT with complaints of recent exacerbation of mid and low back pain. She has had OPPT in the past with good results, but recent health issues made it difficult for her to maintain her HEP. She does demonstrate limitations in thoracic and lumbar mobility, decreased hip strength and limited single leg stability. She would benefit from skilled PT to address her limitations listed above, decrease back pain and facilitate her transition back to a regular gym/home program.    History and Personal Factors relevant to plan of care:  chronic back pain     Clinical Presentation  Stable    Clinical Presentation due to:  gradually worsened after inactivity     Clinical Decision Making  Low    Rehab Potential  Good    PT Frequency  2x / week    PT Duration  6 weeks    PT Treatment/Interventions  ADLs/Self Care Home Management;Moist Heat;Electrical Stimulation;Cryotherapy;Therapeutic activities;Therapeutic exercise;Patient/family education;Manual techniques;Passive range of motion;Taping;Dry needling;Neuromuscular re-education    PT Next Visit Plan  progress lumbar/thoracic mobility; hip IR  mobility; hip strengthening; lifting mechanics review    PT Home Exercise Plan  [email or web address]     Consulted and Agree with Plan of Care  Patient       Patient will benefit from skilled therapeutic intervention in order to improve the following deficits and impairments:  Decreased activity tolerance, Decreased strength, Decreased safety awareness, Impaired flexibility,  Postural dysfunction, Pain, Improper body mechanics, Decreased range of motion, Decreased endurance, Decreased balance, Decreased mobility, Increased muscle spasms, Hypomobility  Visit Diagnosis: Pain in thoracic spine  Bilateral low back pain, unspecified chronicity, with sciatica presence unspecified  Muscle weakness (generalized)  Abnormal posture     Problem List Patient Active Problem List   Diagnosis Date Noted  . Recent skin changes 03/13/2018  . Periodic limb movement 03/13/2018  . Nocturnal leg cramps 03/13/2018  . Aortic insufficiency 01/10/2018  . Non-restorative sleep 01/10/2018  . Snoring 01/10/2018  . Atypical chest pain 10/22/2017  . Anxiety 10/22/2017  . History of loop electrical excision procedure (LEEP) 07/01/2017  . Unintentional weight loss 05/18/2017  . Elevated LDL cholesterol level 05/18/2017  . Hyperlipidemia 05/18/2017  . Irritable bowel syndrome with both constipation and diarrhea 05/18/2017  . Family history of pancreatic cancer 02/18/2017  . Diarrhea 02/18/2017  . Loss of weight 02/18/2017  . Weakness 10/05/2016  . Panic attacks 09/18/2016  . SOB (shortness of breath) 09/18/2016  . Bipolar 1 disorder with moderate mania ([place]) 09/17/2016  . Pulmonary nodule 08/08/2016  . Hoarseness 03/09/2016  . Left knee pain 11/09/2015  . Pain in joint, lower leg 11/09/2015  . Plantar fasciitis of left foot 11/09/2015  . Cough, persistent 07/21/2015  . Post-nasal drip 07/21/2015  . DDD (degenerative disc disease), cervical 07/05/2015  . Chronic nasal congestion 03/01/2015  . Vitreous floaters  of right eye 03/01/2015  . Near syncope 02/09/2015  . Rhinitis, allergic 10/14/2014  . Family history of renal cancer 10/11/2014  . DDD (degenerative disc disease), lumbar 10/11/2014  . Bipolar disorder with depression ([place]) 06/02/2014  . Postmenopausal atrophic vaginitis 03/17/2014  . Prediabetes 12/14/2013  . Encounter for gynecological examination with Papanicolaou smear of cervix 12/08/2013  . Adhesive capsulitis of right shoulder 11/24/2013  . Chronic periodontal disease 06/30/2013  . Genital herpes 11/05/2012  . Hemorrhoids 11/05/2012  . Borderline intellectual functioning 02/15/2012  . Osteoporosis 11/21/2011  . Allergic rhinitis 11/08/2010  . Insomnia 11/08/2010  . GERD (gastroesophageal reflux disease) 09/27/2010   10:44 AM,03/20/18 Sherol Dade PT, DPT [facility] [address] [place], [place], [address] Phone: [phone]   Fax:  [phone]  Name: Kellie [name] MRN: [id number] Date of Birth: 03/07/1956

## 2018-03-21 NOTE — Telephone Encounter (Signed)
Kellie [name] said she would like to try the requip for her legs, but she is very concerned as the pain has gotten worse in her legs. She also states that her legs have been swelling more recently. She said that she is taking the Vitamin D, magnesium, and she is staying hydrated along with going to physical therapy to help with her back pain. She was wondering if it would be a good idea to get a scan done on her legs to help rule out a possible blood clot, as she is worried about blood clots being the issue? Please advise.

## 2018-03-24 NOTE — Telephone Encounter (Signed)
Blood clots are in one leg not 2 legs. They cause swelling/redness/tenderness in one leg. If anything it could be time to do another ABI to look at artery flow. Last ABI was a few years ago and normal. We should like to repeat this?

## 2018-03-25 NOTE — Telephone Encounter (Signed)
Done. Order placed.

## 2018-03-25 NOTE — Telephone Encounter (Signed)
Called patient and she said she would like to have the ABI done again if that is possible. She was wondering if it would be done here or at another facility? Thanks!

## 2018-03-26 ENCOUNTER — Other Ambulatory Visit: Payer: Self-pay | Admitting: Physician Assistant

## 2018-03-26 DIAGNOSIS — R252 Cramp and spasm: Secondary | ICD-10-CM

## 2018-03-26 NOTE — Progress Notes (Signed)
EMG's ordered when it needed to be ABI's.

## 2018-03-27 ENCOUNTER — Ambulatory Visit: Payer: Medicare Other | Attending: Sports Medicine | Admitting: Physical Therapy

## 2018-03-27 ENCOUNTER — Encounter: Payer: Self-pay | Admitting: Physical Therapy

## 2018-03-27 DIAGNOSIS — M6281 Muscle weakness (generalized): Secondary | ICD-10-CM | POA: Insufficient documentation

## 2018-03-27 DIAGNOSIS — M542 Cervicalgia: Secondary | ICD-10-CM | POA: Insufficient documentation

## 2018-03-27 DIAGNOSIS — M546 Pain in thoracic spine: Secondary | ICD-10-CM | POA: Diagnosis not present

## 2018-03-27 DIAGNOSIS — R293 Abnormal posture: Secondary | ICD-10-CM | POA: Insufficient documentation

## 2018-03-27 NOTE — Patient Instructions (Signed)
Access Code: [email or web address]  URL: [email or web address]  Date: 03/27/2018  Prepared by: Sherol Dade   Exercises  90/90 Lower Trunk Rotation - 10 reps - 2x daily - 7x weekly  Sidelying Thoracic Lumbar Rotation - 10 reps - 2x daily - 7x weekly  Single Leg Bridge with Leg Supported - 5 reps - 2-3 sets - 1x daily - 7x weekly  Supine Bridge on Wall - 10-15 reps - 2 sets - 1x daily - 7x weekly     [facility] [address] [place], [place] [address] Phone # [phone] Fax [phone]

## 2018-03-27 NOTE — Therapy (Signed)
[facility] [address] [place], [place], [address] Phone: [phone]   Fax:  [phone]  Physical Therapy Treatment  Patient Details  Name: Kellie [name] MRN: [id number] Date of Birth: 1956/03/21 Referring Provider (PT): [name], [place]   Encounter Date: 03/27/2018  PT End of Session - 03/27/18 1142    Visit Number  2    Date for PT Re-Evaluation  05/01/18    Authorization Type  Medicare A and B    PT Start Time  1101    PT Stop Time  1142    PT Time Calculation (min)  41 min    Activity Tolerance  No increased pain;Patient tolerated treatment well    Behavior During Therapy  [facility] for tasks assessed/performed       Past Medical History:  Diagnosis Date  . Abnormal Pap smear of cervix   . Allergic rhinitis 11/08/2010  . Anxiety   . Asthma   . Bronchitis   . Herpes simplex of female genitalia   . Hyperlipidemia   . Mitral valve prolapse   . Osteoporosis 11/21/2011  . Urticaria     Past Surgical History:  Procedure Laterality Date  . CLOSED MANIPULATION SHOULDER  7&01/2005   x2, 30 days apart  . CLOSED MANIPULATION SHOULDER  2006   Left  . COLONOSCOPY  09/2012   Medium sized external hemorrhoids, few small divertic in left colon, otherwise normal colon (repeat 10 yrs)  . ESOPHAGOGASTRODUODENOSCOPY  09/2012   Normal (Dr. Ardis Hughs)  . LEEP  11/2004    There were no vitals filed for this visit.  Subjective Assessment - 03/27/18 1103    Subjective  Pt reports that things are going well. She was only able to complete her HEP yesterday. She has low back pain and knee and ankle pain currently.     Pertinent History  anxiety, asthma     Patient Stated Goals  decrease back pain     Currently in Pain?  Yes    Pain Score  8     Pain Location  S[place]rum    Pain Orientation  Right;Lower    Pain Descriptors / Indicators  Aching;Sore    Pain Type  --   acute on chronic   Pain Radiating Towards  none     Pain  Onset  1 to 4 weeks ago    Pain Frequency  Constant    Aggravating Factors   squatting and bending over, lifting case of water     Pain Relieving Factors  rest and Aleve     Effect of Pain on Daily Activities  pt able to do everything she needs to do                        [facility] Adult PT Treatment/Exercise - 03/27/18 0001      Self-Care   Self-Care  Lifting    Lifting  proper form with lifting objects from the floor; pt demonstration of proper technique x5 reps with PT verbal cuing       Exercises   Exercises  Knee/Hip;Lumbar      Lumbar Exercises: Supine   Dead Bug  10 reps    Dead Bug Limitations  cues for proper technique     Single Leg Bridge  --    Bridge with [employer] Limitations  --      Lumbar Exercises: Sidelying   Other Sidelying Lumbar Exercises  thoracic  open book stretch x15 reps each side       Knee/Hip Exercises: Stretches   Other Knee/Hip Stretches  glute stretch supine 2x20 sec each       Knee/Hip Exercises: Standing   Other Standing Knee Exercises  free squat x20 reps       Knee/Hip Exercises: Supine   Bridges  Both;2 sets;10 reps    Bridges Limitations  LE on 8" box with knees bent x2 sets, knees flexed x2 sets     Single Leg Bridge  Left;Right;5 reps;Other (comment);2 sets   leg lock, x2 sets each      Knee/Hip Exercises: Sidelying   Hip ABduction  Left;Right;2 sets;10 reps    Hip ABduction Limitations  2#, cues for proper form              PT Education - 03/27/18 1146    Education Details  technique with therex; lifting mechanics; updated HEP    Person(s) Educated  Patient    Methods  Explanation;Handout;Verbal cues    Comprehension  Verbalized understanding;Returned demonstration       PT Short Term Goals - 03/20/18 1038      PT SHORT TERM GOAL #1   Title  Pt will be independent with her initial HEP to improve flexibility and decrease pain.     Time  3    Period  Weeks    Status  New    Target Date  04/10/18       PT SHORT TERM GOAL #2   Title  Pt will report atleast 25% reduction in her pain with daily activity.     Time  3    Period  Weeks    Status  New      PT SHORT TERM GOAL #3   Title  Pt will demo proper set up and use of lumbar roll while sitting, to improve posture and body mechanics throughout the day.    Time  3    Period  Weeks    Status  New        PT Long Term Goals - 03/20/18 1039      PT LONG TERM GOAL #1   Title  Pt will be independent with her advanced HEP to allow for safe transition to a gym program for maintenance after d/c.     Time  6    Period  Weeks    Status  New    Target Date  05/01/18      PT LONG TERM GOAL #2   Title  Pt will report atleast 50% reduction in her mid/low back pain to allow her to return to exercise at the gym.    Time  6    Period  Weeks    Status  New      PT LONG TERM GOAL #3   Title  Pt will have improved BLE strength to atleast 4/5 MMT which will improve her efficiency with activity at home.    Time  6    Period  Weeks    Status  New      PT LONG TERM GOAL #4   Title  Pt will have improved thoracic rotation ROM to atleast 45 deg bilaterally, which will improve her ability to complete daily tasks without compensation in the cervical and lumbar spine.     Time  6    Period  Weeks    Status  New  Plan - 03/27/18 1143    Clinical Impression Statement  Pt arrived having been able to complete her HEP only one day since her evaluation. She had reports of 8/10 pain, but there were no physical signs of distress, and she was able to complete all exercises during her session without increase in pain. Focused on HEP progressions and bridging to increase gluteal strength. Therapist also reviewed her technique with squatting and lifting at home to decrease strain on the low back. Pt did verbalize understanding of this end of today's session. Will continue with current POC.    Rehab Potential  Good    PT Frequency  2x / week     PT Duration  6 weeks    PT Treatment/Interventions  ADLs/Self Care Home Management;Moist Heat;Electrical Stimulation;Cryotherapy;Therapeutic activities;Therapeutic exercise;Patient/family education;Manual techniques;Passive range of motion;Taping;Dry needling;Neuromuscular re-education    PT Next Visit Plan  f/u on HEP; hip IR/ER mobility and strength; hip extensor strengthening    PT Home Exercise Plan  [email or web address]     Consulted and Agree with Plan of Care  Patient       Patient will benefit from skilled therapeutic intervention in order to improve the following deficits and impairments:  Decreased activity tolerance, Decreased strength, Decreased safety awareness, Impaired flexibility, Postural dysfunction, Pain, Improper body mechanics, Decreased range of motion, Decreased endurance, Decreased balance, Decreased mobility, Increased muscle spasms, Hypomobility  Visit Diagnosis: Pain in thoracic spine  Muscle weakness (generalized)  Abnormal posture  Cervicalgia     Problem List Patient Active Problem List   Diagnosis Date Noted  . Recent skin changes 03/13/2018  . Periodic limb movement 03/13/2018  . Nocturnal leg cramps 03/13/2018  . Aortic insufficiency 01/10/2018  . Non-restorative sleep 01/10/2018  . Snoring 01/10/2018  . Atypical chest pain 10/22/2017  . Anxiety 10/22/2017  . History of loop electrical excision procedure (LEEP) 07/01/2017  . Unintentional weight loss 05/18/2017  . Elevated LDL cholesterol level 05/18/2017  . Hyperlipidemia 05/18/2017  . Irritable bowel syndrome with both constipation and diarrhea 05/18/2017  . Family history of pancreatic cancer 02/18/2017  . Diarrhea 02/18/2017  . Loss of weight 02/18/2017  . Weakness 10/05/2016  . Panic attacks 09/18/2016  . SOB (shortness of breath) 09/18/2016  . Bipolar 1 disorder with moderate mania ([place]) 09/17/2016  . Pulmonary nodule 08/08/2016  . Hoarseness 03/09/2016  . Left knee pain 11/09/2015  . Pain in  joint, lower leg 11/09/2015  . Plantar fasciitis of left foot 11/09/2015  . Cough, persistent 07/21/2015  . Post-nasal drip 07/21/2015  . DDD (degenerative disc disease), cervical 07/05/2015  . Chronic nasal congestion 03/01/2015  . Vitreous floaters of right eye 03/01/2015  . Near syncope 02/09/2015  . Rhinitis, allergic 10/14/2014  . Family history of renal cancer 10/11/2014  . DDD (degenerative disc disease), lumbar 10/11/2014  . Bipolar disorder with depression ([place]) 06/02/2014  . Postmenopausal atrophic vaginitis 03/17/2014  . Prediabetes 12/14/2013  . Encounter for gynecological examination with Papanicolaou smear of cervix 12/08/2013  . Adhesive capsulitis of right shoulder 11/24/2013  . Chronic periodontal disease 06/30/2013  . Genital herpes 11/05/2012  . Hemorrhoids 11/05/2012  . Borderline intellectual functioning 02/15/2012  . Osteoporosis 11/21/2011  . Allergic rhinitis 11/08/2010  . Insomnia 11/08/2010  . GERD (gastroesophageal reflux disease) 09/27/2010     11:47 AM,03/27/18 Sherol Dade PT, DPT [place] [facility] [address], [place], [place], [address] Phone: [phone]  Fax:  [phone]  Name: LILYGRACE RODICK MRN: [id number] Date of Birth: 01/04/1956

## 2018-04-01 ENCOUNTER — Ambulatory Visit: Payer: Medicare Other | Admitting: Physical Therapy

## 2018-04-01 ENCOUNTER — Encounter: Payer: Self-pay | Admitting: Physical Therapy

## 2018-04-01 DIAGNOSIS — M6281 Muscle weakness (generalized): Secondary | ICD-10-CM

## 2018-04-01 DIAGNOSIS — R293 Abnormal posture: Secondary | ICD-10-CM | POA: Diagnosis not present

## 2018-04-01 DIAGNOSIS — M546 Pain in thoracic spine: Secondary | ICD-10-CM | POA: Diagnosis not present

## 2018-04-01 DIAGNOSIS — M542 Cervicalgia: Secondary | ICD-10-CM | POA: Diagnosis not present

## 2018-04-01 NOTE — Therapy (Signed)
[facility] [address], [place], [place], [address] Phone: [phone]   Fax:  [phone]  Physical Therapy Treatment  Patient Details  Name: Kellie [name] MRN: [id number] Date of Birth: Jan 20, 1956 Referring Provider (PT): [name], [place]   Encounter Date: 04/01/2018  PT End of Session - 04/01/18 1223    Visit Number  3    Date for PT Re-Evaluation  05/01/18    Authorization Type  Medicare A and B    PT Start Time  1146    PT Stop Time  1225    PT Time Calculation (min)  39 min    Activity Tolerance  No increased pain;Patient tolerated treatment well    Behavior During Therapy  [facility] for tasks assessed/performed       Past Medical History:  Diagnosis Date  . Abnormal Pap smear of cervix   . Allergic rhinitis 11/08/2010  . Anxiety   . Asthma   . Bronchitis   . Herpes simplex of female genitalia   . Hyperlipidemia   . Mitral valve prolapse   . Osteoporosis 11/21/2011  . Urticaria     Past Surgical History:  Procedure Laterality Date  . CLOSED MANIPULATION SHOULDER  7&01/2005   x2, 30 days apart  . CLOSED MANIPULATION SHOULDER  2006   Left  . COLONOSCOPY  09/2012   Medium sized external hemorrhoids, few small divertic in left colon, otherwise normal colon (repeat 10 yrs)  . ESOPHAGOGASTRODUODENOSCOPY  09/2012   Normal (Dr. Ardis Hughs)  . LEEP  11/2004    There were no vitals filed for this visit.  Subjective Assessment - 04/01/18 1149    Subjective  Pt reports that she has been trying to complete her HEP. She does not report any pain upon arrival.     Pertinent History  anxiety, asthma     Patient Stated Goals  decrease back pain     Currently in Pain?  Other (Comment)   none noted at arrival   Pain Onset  1 to 4 weeks ago                       [facility] Adult PT Treatment/Exercise - 04/01/18 0001      Self-Care   Lifting  Pt lifting #15 box x5 rep, therapist cuing for proper  alignment/LE position      Exercises   Exercises  Knee/Hip;Lumbar      Lumbar Exercises: Aerobic   Nustep  intervals L1 up to L4 every minute, x6 min encouraged to maintain SPM above 60 for LE endurance      Lumbar Exercises: Supine   Bent Knee Raise Limitations  bent knee 90 deg hold with LE tap x5 reps each with and without UE support on table       Lumbar Exercises: Quadruped   Madcat/Old Horse  15 reps    Madcat/Old Horse Limitations  visual cues    Straight Leg Raise  10 reps    Straight Leg Raises Limitations  trendelenburg on Lt, heavy cuing to improve technique       Knee/Hip Exercises: Machines for Strengthening   Total Gym Leg Press  Seat 5, BLE #110 2x10 reps       Knee/Hip Exercises: Standing   Other Standing Knee Exercises  BLE front squat with #10 dumbbell x10 reps, squat hold with rows 2x10 reps (4# dumbbells)       Knee/Hip Exercises: Sidelying   Clams  x10 reps each with green TB              PT Education - 04/01/18 1222    Education Details  lifting mechanics; technique with therex    Person(s) Educated  Patient    Methods  Explanation;Tactile cues;Verbal cues    Comprehension  Verbalized understanding;Returned demonstration       PT Short Term Goals - 04/01/18 1224      PT SHORT TERM GOAL #1   Title  Pt will be independent with her initial HEP to improve flexibility and decrease pain.     Time  3    Period  Weeks    Status  Partially Met      PT SHORT TERM GOAL #2   Title  Pt will report atleast 25% reduction in her pain with daily activity.     Time  3    Period  Weeks    Status  On-going      PT SHORT TERM GOAL #3   Title  Pt will demo proper set up and use of lumbar roll while sitting, to improve posture and body mechanics throughout the day.    Time  3    Period  Weeks    Status  Achieved        PT Long Term Goals - 04/01/18 1224      PT LONG TERM GOAL #1   Title  Pt will be independent with her advanced HEP to allow for safe  transition to a gym program for maintenance after d/c.     Time  6    Period  Weeks    Status  New      PT LONG TERM GOAL #2   Title  Pt will report atleast 50% reduction in her mid/low back pain to allow her to return to exercise at the gym.    Time  6    Period  Weeks    Status  New      PT LONG TERM GOAL #3   Title  Pt will have improved BLE strength to atleast 4/5 MMT which will improve her efficiency with activity at home.    Time  6    Period  Weeks    Status  New      PT LONG TERM GOAL #4   Title  Pt will have improved thoracic rotation ROM to atleast 45 deg bilaterally, which will improve her ability to complete daily tasks without compensation in the cervical and lumbar spine.     Time  6    Period  Weeks    Status  New            Plan - 04/01/18 1234    Clinical Impression Statement  Pt has increased HEP adherence since her last session. She has no pain reported upon arrival and this was reported during today's exercises. Pt required review of proper lifting mechanics and demonstrated good understanding without increase in low back pain with this. Pt had increased difficulty with quadruped LE extension secondary to both trunk and hip strength limitations but was able to correct with therapist tactile cues. Will continue to follow up with lifting mechanics in future sessions, as this is a primary cause of her back discomfort throughout the day.    Rehab Potential  Good    PT Frequency  2x / week    PT Duration  6 weeks    PT Treatment/Interventions  ADLs/Self Care Home Management;Moist  Heat;Electrical Stimulation;Cryotherapy;Therapeutic activities;Therapeutic exercise;Patient/family education;Manual techniques;Passive range of motion;Taping;Dry needling;Neuromuscular re-education    PT Next Visit Plan  f/u lifting mechanics; hip IR/ER mobility and strength; hip extensor strengthening    PT Home Exercise Plan  [email or web address]     Consulted and Agree with Plan of Care  Patient        Patient will benefit from skilled therapeutic intervention in order to improve the following deficits and impairments:  Decreased activity tolerance, Decreased strength, Decreased safety awareness, Impaired flexibility, Postural dysfunction, Pain, Improper body mechanics, Decreased range of motion, Decreased endurance, Decreased balance, Decreased mobility, Increased muscle spasms, Hypomobility  Visit Diagnosis: Pain in thoracic spine  Muscle weakness (generalized)  Abnormal posture     Problem List Patient Active Problem List   Diagnosis Date Noted  . Recent skin changes 03/13/2018  . Periodic limb movement 03/13/2018  . Nocturnal leg cramps 03/13/2018  . Aortic insufficiency 01/10/2018  . Non-restorative sleep 01/10/2018  . Snoring 01/10/2018  . Atypical chest pain 10/22/2017  . Anxiety 10/22/2017  . History of loop electrical excision procedure (LEEP) 07/01/2017  . Unintentional weight loss 05/18/2017  . Elevated LDL cholesterol level 05/18/2017  . Hyperlipidemia 05/18/2017  . Irritable bowel syndrome with both constipation and diarrhea 05/18/2017  . Family history of pancreatic cancer 02/18/2017  . Diarrhea 02/18/2017  . Loss of weight 02/18/2017  . Weakness 10/05/2016  . Panic attacks 09/18/2016  . SOB (shortness of breath) 09/18/2016  . Bipolar 1 disorder with moderate mania ([place]) 09/17/2016  . Pulmonary nodule 08/08/2016  . Hoarseness 03/09/2016  . Left knee pain 11/09/2015  . Pain in joint, lower leg 11/09/2015  . Plantar fasciitis of left foot 11/09/2015  . Cough, persistent 07/21/2015  . Post-nasal drip 07/21/2015  . DDD (degenerative disc disease), cervical 07/05/2015  . Chronic nasal congestion 03/01/2015  . Vitreous floaters of right eye 03/01/2015  . Near syncope 02/09/2015  . Rhinitis, allergic 10/14/2014  . Family history of renal cancer 10/11/2014  . DDD (degenerative disc disease), lumbar 10/11/2014  . Bipolar disorder with depression ([place])  06/02/2014  . Postmenopausal atrophic vaginitis 03/17/2014  . Prediabetes 12/14/2013  . Encounter for gynecological examination with Papanicolaou smear of cervix 12/08/2013  . Adhesive capsulitis of right shoulder 11/24/2013  . Chronic periodontal disease 06/30/2013  . Genital herpes 11/05/2012  . Hemorrhoids 11/05/2012  . Borderline intellectual functioning 02/15/2012  . Osteoporosis 11/21/2011  . Allergic rhinitis 11/08/2010  . Insomnia 11/08/2010  . GERD (gastroesophageal reflux disease) 09/27/2010   1:13 PM,04/01/18 Sherol Dade PT, DPT [place] [facility] [address] [place], [place], [address] Phone: [phone]   Fax:  [phone]  Name: Kellie [name] MRN: [id number] Date of Birth: 06-12-1956

## 2018-04-02 ENCOUNTER — Encounter (HOSPITAL_COMMUNITY): Payer: Self-pay

## 2018-04-02 ENCOUNTER — Other Ambulatory Visit: Payer: Self-pay | Admitting: Physician Assistant

## 2018-04-02 DIAGNOSIS — I739 Peripheral vascular disease, unspecified: Secondary | ICD-10-CM

## 2018-04-03 ENCOUNTER — Ambulatory Visit (INDEPENDENT_AMBULATORY_CARE_PROVIDER_SITE_OTHER): Payer: Medicare Other

## 2018-04-03 DIAGNOSIS — Z1231 Encounter for screening mammogram for malignant neoplasm of breast: Secondary | ICD-10-CM

## 2018-04-03 NOTE — Telephone Encounter (Signed)
Call pt: normal mammogram. Follow up in 1 year.

## 2018-04-08 ENCOUNTER — Ambulatory Visit: Payer: Medicare Other | Admitting: Physical Therapy

## 2018-04-08 ENCOUNTER — Encounter: Payer: Self-pay | Admitting: Physical Therapy

## 2018-04-08 DIAGNOSIS — R293 Abnormal posture: Secondary | ICD-10-CM

## 2018-04-08 DIAGNOSIS — M546 Pain in thoracic spine: Secondary | ICD-10-CM

## 2018-04-08 DIAGNOSIS — M6281 Muscle weakness (generalized): Secondary | ICD-10-CM

## 2018-04-08 DIAGNOSIS — M542 Cervicalgia: Secondary | ICD-10-CM

## 2018-04-08 NOTE — Therapy (Signed)
[facility] [address], [place], [place], [address] Phone: [phone]   Fax:  [phone]  Physical Therapy Treatment  Patient Details  Name: Kellie [name] MRN: [id number] Date of Birth: 01/18/1956 Referring Provider (PT): [name], [place]   Encounter Date: 04/08/2018  PT End of Session - 04/08/18 1056    Visit Number  4    Date for PT Re-Evaluation  05/01/18    Authorization Type  Medicare A and B    PT Start Time  8315    PT Stop Time  1057    PT Time Calculation (min)  42 min    Activity Tolerance  No increased pain;Patient tolerated treatment well    Behavior During Therapy  [facility] for tasks assessed/performed       Past Medical History:  Diagnosis Date  . Abnormal Pap smear of cervix   . Allergic rhinitis 11/08/2010  . Anxiety   . Asthma   . Bronchitis   . Herpes simplex of female genitalia   . Hyperlipidemia   . Mitral valve prolapse   . Osteoporosis 11/21/2011  . Urticaria     Past Surgical History:  Procedure Laterality Date  . CLOSED MANIPULATION SHOULDER  7&01/2005   x2, 30 days apart  . CLOSED MANIPULATION SHOULDER  2006   Left  . COLONOSCOPY  09/2012   Medium sized external hemorrhoids, few small divertic in left colon, otherwise normal colon (repeat 10 yrs)  . ESOPHAGOGASTRODUODENOSCOPY  09/2012   Normal (Dr. Ardis Hughs)  . LEEP  11/2004    There were no vitals filed for this visit.  Subjective Assessment - 04/08/18 1019    Subjective  Pt reports that things are going well. No pain currently, but she did have some fatigue on her back following a trip to the grocery store.     Pertinent History  anxiety, asthma     Patient Stated Goals  decrease back pain     Currently in Pain?  No/denies    Pain Onset  1 to 4 weeks ago                       [facility] Adult PT Treatment/Exercise - 04/08/18 0001      Exercises   Exercises  Lumbar      Lumbar Exercises: Standing   Row   Right;Left;10 reps   x2 sets, technique for LE/posture      Lumbar Exercises: Seated   Hip Flexion on Ball  Both;10 reps    Hip Flexion on Ball Limitations  seated on firm surface     Sit to Stand  15 reps    Sit to Stand Limitations  without UE support     Other Seated Lumbar Exercises  anterior/posterior pelvic tilt x15 reps       Lumbar Exercises: Supine   Single Leg Bridge  15 reps      Lumbar Exercises: Quadruped   Madcat/Old Horse  15 reps    Madcat/Old Horse Limitations  visual cues      Knee/Hip Exercises: Stretches   Other Knee/Hip Stretches  glute stretch supine 2x20 sec each       Knee/Hip Exercises: Machines for Strengthening   Total Gym Leg Press  seat 6, BLE #120 2x15 reps              PT Education - 04/08/18 1040    Education Details  technique with therex    Person(s) Educated  Patient    Methods  Explanation;Verbal cues;Tactile cues    Comprehension  Returned demonstration;Verbalized understanding       PT Short Term Goals - 04/01/18 1224      PT SHORT TERM GOAL #1   Title  Pt will be independent with her initial HEP to improve flexibility and decrease pain.     Time  3    Period  Weeks    Status  Partially Met      PT SHORT TERM GOAL #2   Title  Pt will report atleast 25% reduction in her pain with daily activity.     Time  3    Period  Weeks    Status  On-going      PT SHORT TERM GOAL #3   Title  Pt will demo proper set up and use of lumbar roll while sitting, to improve posture and body mechanics throughout the day.    Time  3    Period  Weeks    Status  Achieved        PT Long Term Goals - 04/01/18 1224      PT LONG TERM GOAL #1   Title  Pt will be independent with her advanced HEP to allow for safe transition to a gym program for maintenance after d/c.     Time  6    Period  Weeks    Status  New      PT LONG TERM GOAL #2   Title  Pt will report atleast 50% reduction in her mid/low back pain to allow her to return to exercise  at the gym.    Time  6    Period  Weeks    Status  New      PT LONG TERM GOAL #3   Title  Pt will have improved BLE strength to atleast 4/5 MMT which will improve her efficiency with activity at home.    Time  6    Period  Weeks    Status  New      PT LONG TERM GOAL #4   Title  Pt will have improved thoracic rotation ROM to atleast 45 deg bilaterally, which will improve her ability to complete daily tasks without compensation in the cervical and lumbar spine.     Time  6    Period  Weeks    Status  New            Plan - 04/08/18 1056    Clinical Impression Statement  Pt is making progress, reporting no pain upon arrival and noting consistent HEP adherence over the past week. She plans to start back at the gym next week to further progress her strength and activity tolerance. Therapist ensured she was able to set up/complete leg press machine for carry over at the gym and she did do this with minimal cues. Progressed trunk strengthening to an upright position and pt had good understanding of this. Will continue with current POC.     Rehab Potential  Good    PT Frequency  2x / week    PT Duration  6 weeks    PT Treatment/Interventions  ADLs/Self Care Home Management;Moist Heat;Electrical Stimulation;Cryotherapy;Therapeutic activities;Therapeutic exercise;Patient/family education;Manual techniques;Passive range of motion;Taping;Dry needling;Neuromuscular re-education    PT Next Visit Plan  f/u lifting mechanics; hip IR/ER mobility and strength; hip extensor strengthening    PT Home Exercise Plan  [id number]     [id number] and Agree with Plan of Care  Patient       Patient will benefit from skilled therapeutic intervention in order to improve the following deficits and impairments:  Decreased activity tolerance, Decreased strength, Decreased safety awareness, Impaired flexibility, Postural dysfunction, Pain, Improper body mechanics, Decreased range of motion, Decreased endurance,  Decreased balance, Decreased mobility, Increased muscle spasms, Hypomobility  Visit Diagnosis: Pain in thoracic spine  Muscle weakness (generalized)  Abnormal posture  Cervicalgia     Problem List Patient Active Problem List   Diagnosis Date Noted  . Recent skin changes 03/13/2018  . Periodic limb movement 03/13/2018  . Nocturnal leg cramps 03/13/2018  . Aortic insufficiency 01/10/2018  . Non-restorative sleep 01/10/2018  . Snoring 01/10/2018  . Atypical chest pain 10/22/2017  . Anxiety 10/22/2017  . History of loop electrical excision procedure (LEEP) 07/01/2017  . Unintentional weight loss 05/18/2017  . Elevated LDL cholesterol level 05/18/2017  . Hyperlipidemia 05/18/2017  . Irritable bowel syndrome with both constipation and diarrhea 05/18/2017  . Family history of pancreatic cancer 02/18/2017  . Diarrhea 02/18/2017  . Loss of weight 02/18/2017  . Weakness 10/05/2016  . Panic attacks 09/18/2016  . SOB (shortness of breath) 09/18/2016  . Bipolar 1 disorder with moderate mania ([place]) 09/17/2016  . Pulmonary nodule 08/08/2016  . Hoarseness 03/09/2016  . Left knee pain 11/09/2015  . Pain in joint, lower leg 11/09/2015  . Plantar fasciitis of left foot 11/09/2015  . Cough, persistent 07/21/2015  . Post-nasal drip 07/21/2015  . DDD (degenerative disc disease), cervical 07/05/2015  . Chronic nasal congestion 03/01/2015  . Vitreous floaters of right eye 03/01/2015  . Near syncope 02/09/2015  . Rhinitis, allergic 10/14/2014  . Family history of renal cancer 10/11/2014  . DDD (degenerative disc disease), lumbar 10/11/2014  . Bipolar disorder with depression ([place]) 06/02/2014  . Postmenopausal atrophic vaginitis 03/17/2014  . Prediabetes 12/14/2013  . Encounter for gynecological examination with Papanicolaou smear of cervix 12/08/2013  . Adhesive capsulitis of right shoulder 11/24/2013  . Chronic periodontal disease 06/30/2013  . Genital herpes 11/05/2012  .  Hemorrhoids 11/05/2012  . Borderline intellectual functioning 02/15/2012  . Osteoporosis 11/21/2011  . Allergic rhinitis 11/08/2010  . Insomnia 11/08/2010  . GERD (gastroesophageal reflux disease) 09/27/2010     11:00 AM,04/08/18 Sherol Dade PT, DPT [place] [facility] [address], [place], [place], [address] Phone: [phone]   Fax:  [phone]  Name: NEKESHIA LENHARDT MRN: [id number] Date of Birth: [birth year]

## 2018-04-10 ENCOUNTER — Encounter: Payer: Self-pay | Admitting: Physical Therapy

## 2018-04-10 ENCOUNTER — Ambulatory Visit: Payer: Medicare Other | Admitting: Physical Therapy

## 2018-04-10 DIAGNOSIS — M542 Cervicalgia: Secondary | ICD-10-CM | POA: Diagnosis not present

## 2018-04-10 DIAGNOSIS — R293 Abnormal posture: Secondary | ICD-10-CM

## 2018-04-10 DIAGNOSIS — M6281 Muscle weakness (generalized): Secondary | ICD-10-CM

## 2018-04-10 DIAGNOSIS — M546 Pain in thoracic spine: Secondary | ICD-10-CM | POA: Diagnosis not present

## 2018-04-10 NOTE — Therapy (Signed)
[facility] [address] [place], [place], [address] Phone: [phone]   Fax:  [phone]  Physical Therapy Treatment  Patient Details  Name: Kellie [name] MRN: [id number] Date of Birth: 1956-01-10 Referring Provider (PT): [name], [place]   Encounter Date: 04/10/2018  PT End of Session - 04/10/18 0909    Visit Number  5    Date for PT Re-Evaluation  05/01/18    Authorization Type  Medicare A and B    PT Start Time  0845    PT Stop Time  0925    PT Time Calculation (min)  40 min    Activity Tolerance  No increased pain;Patient tolerated treatment well    Behavior During Therapy  [facility] for tasks assessed/performed       Past Medical History:  Diagnosis Date  . Abnormal Pap smear of cervix   . Allergic rhinitis 11/08/2010  . Anxiety   . Asthma   . Bronchitis   . Herpes simplex of female genitalia   . Hyperlipidemia   . Mitral valve prolapse   . Osteoporosis 11/21/2011  . Urticaria     Past Surgical History:  Procedure Laterality Date  . CLOSED MANIPULATION SHOULDER  7&01/2005   x2, 30 days apart  . CLOSED MANIPULATION SHOULDER  2006   Left  . COLONOSCOPY  09/2012   Medium sized external hemorrhoids, few small divertic in left colon, otherwise normal colon (repeat 10 yrs)  . ESOPHAGOGASTRODUODENOSCOPY  09/2012   Normal (Dr. Ardis Hughs)  . LEEP  11/2004    There were no vitals filed for this visit.  Subjective Assessment - 04/10/18 0848    Subjective  Pt states that she has no pain currently.     Pertinent History  anxiety, asthma     Patient Stated Goals  decrease back pain     Currently in Pain?  No/denies    Pain Onset  1 to 4 weeks ago                       [facility] Adult PT Treatment/Exercise - 04/10/18 0001      Exercises   Exercises  Other Exercises;Knee/Hip    Other Exercises   lat pull down 2x10 reps with #30   8 reps for second set      Lumbar Exercises: Seated   Other  Seated Lumbar Exercises  abdominal brace with yellow TB UE diagonal x10 reps each; abdominal brace with UE horizontal abduction x15 reps with yellow TB       Knee/Hip Exercises: Machines for Strengthening   Total Gym Leg Press  seat 6, BLE #125 2x15 reps      Knee/Hip Exercises: Standing   Hip Abduction  Both;2 sets;10 reps;Knee straight;Stengthening    Abduction Limitations  yellow TB around feet     Other Standing Knee Exercises  Lt and Rt hip IR/ER with overpressure x15 reps each on 2nd step     Other Standing Knee Exercises  hip hike/lower from 6" step 2x10 reps each LE      Knee/Hip Exercises: Supine   Single Leg Bridge  Right;Strengthening;Left;1 set;10 reps   leg lock bridge            PT Education - 04/10/18 0909    Education Details  technique with therex    Person(s) Educated  Patient    Methods  Explanation;Verbal cues;Demonstration    Comprehension  Verbalized understanding;Returned demonstration  PT Short Term Goals - 04/10/18 0909      PT SHORT TERM GOAL #1   Title  Pt will be independent with her initial HEP to improve flexibility and decrease pain.     Time  3    Period  Weeks    Status  Achieved      PT SHORT TERM GOAL #2   Title  Pt will report atleast 25% reduction in her pain with daily activity.     Baseline  pain is 40% improved     Time  3    Period  Weeks    Status  Achieved      PT SHORT TERM GOAL #3   Title  Pt will demo proper set up and use of lumbar roll while sitting, to improve posture and body mechanics throughout the day.    Time  3    Period  Weeks    Status  Achieved        PT Long Term Goals - 04/01/18 1224      PT LONG TERM GOAL #1   Title  Pt will be independent with her advanced HEP to allow for safe transition to a gym program for maintenance after d/c.     Time  6    Period  Weeks    Status  New      PT LONG TERM GOAL #2   Title  Pt will report atleast 50% reduction in her mid/low back pain to allow her to  return to exercise at the gym.    Time  6    Period  Weeks    Status  New      PT LONG TERM GOAL #3   Title  Pt will have improved BLE strength to atleast 4/5 MMT which will improve her efficiency with activity at home.    Time  6    Period  Weeks    Status  New      PT LONG TERM GOAL #4   Title  Pt will have improved thoracic rotation ROM to atleast 45 deg bilaterally, which will improve her ability to complete daily tasks without compensation in the cervical and lumbar spine.     Time  6    Period  Weeks    Status  New            Plan - 04/10/18 0915    Clinical Impression Statement  Pt is making progress towards her goals, meeting all short term goals at this point in her POC. She feels that her pain is 40% improved and not as intense throughout the day. Continued this session with therex to promote hip mobility, hip strength and trunk strength. Pt was able to complete all exercises with minor increase in low back fatigue. This improved with verbal cues. Will continue with current POC.    Rehab Potential  Good    PT Frequency  2x / week    PT Duration  6 weeks    PT Treatment/Interventions  ADLs/Self Care Home Management;Moist Heat;Electrical Stimulation;Cryotherapy;Therapeutic activities;Therapeutic exercise;Patient/family education;Manual techniques;Passive range of motion;Taping;Dry needling;Neuromuscular re-education    PT Next Visit Plan  f/u lifting mechanics; hip IR/ER mobility and strength; hip extensor strengthening    PT Home Exercise Plan  [email or web address]     Consulted and Agree with Plan of Care  Patient       Patient will benefit from skilled therapeutic intervention in order to improve the following deficits and impairments:  Decreased activity tolerance, Decreased strength, Decreased safety awareness, Impaired flexibility, Postural dysfunction, Pain, Improper body mechanics, Decreased range of motion, Decreased endurance, Decreased balance, Decreased mobility, Increased  muscle spasms, Hypomobility  Visit Diagnosis: Pain in thoracic spine  Muscle weakness (generalized)  Abnormal posture  Cervicalgia     Problem List Patient Active Problem List   Diagnosis Date Noted  . Recent skin changes 03/13/2018  . Periodic limb movement 03/13/2018  . Nocturnal leg cramps 03/13/2018  . Aortic insufficiency 01/10/2018  . Non-restorative sleep 01/10/2018  . Snoring 01/10/2018  . Atypical chest pain 10/22/2017  . Anxiety 10/22/2017  . History of loop electrical excision procedure (LEEP) 07/01/2017  . Unintentional weight loss 05/18/2017  . Elevated LDL cholesterol level 05/18/2017  . Hyperlipidemia 05/18/2017  . Irritable bowel syndrome with both constipation and diarrhea 05/18/2017  . Family history of pancreatic cancer 02/18/2017  . Diarrhea 02/18/2017  . Loss of weight 02/18/2017  . Weakness 10/05/2016  . Panic attacks 09/18/2016  . SOB (shortness of breath) 09/18/2016  . Bipolar 1 disorder with moderate mania ([place]) 09/17/2016  . Pulmonary nodule 08/08/2016  . Hoarseness 03/09/2016  . Left knee pain 11/09/2015  . Pain in joint, lower leg 11/09/2015  . Plantar fasciitis of left foot 11/09/2015  . Cough, persistent 07/21/2015  . Post-nasal drip 07/21/2015  . DDD (degenerative disc disease), cervical 07/05/2015  . Chronic nasal congestion 03/01/2015  . Vitreous floaters of right eye 03/01/2015  . Near syncope 02/09/2015  . Rhinitis, allergic 10/14/2014  . Family history of renal cancer 10/11/2014  . DDD (degenerative disc disease), lumbar 10/11/2014  . Bipolar disorder with depression ([place]) 06/02/2014  . Postmenopausal atrophic vaginitis 03/17/2014  . Prediabetes 12/14/2013  . Encounter for gynecological examination with Papanicolaou smear of cervix 12/08/2013  . Adhesive capsulitis of right shoulder 11/24/2013  . Chronic periodontal disease 06/30/2013  . Genital herpes 11/05/2012  . Hemorrhoids 11/05/2012  . Borderline intellectual  functioning 02/15/2012  . Osteoporosis 11/21/2011  . Allergic rhinitis 11/08/2010  . Insomnia 11/08/2010  . GERD (gastroesophageal reflux disease) 09/27/2010   9:27 AM,04/10/18 Sherol Dade PT, DPT [place] [facility] [address] [place], [place], [address] Phone: [phone]   Fax:  [phone]  Name: Kellie [name] MRN: [id number] Date of Birth: 06-04-1956

## 2018-04-14 ENCOUNTER — Ambulatory Visit (HOSPITAL_COMMUNITY): Payer: Self-pay | Admitting: Licensed Clinical Social Worker

## 2018-04-15 ENCOUNTER — Ambulatory Visit: Payer: Medicare Other | Admitting: Physical Therapy

## 2018-04-15 DIAGNOSIS — M546 Pain in thoracic spine: Secondary | ICD-10-CM | POA: Diagnosis not present

## 2018-04-15 DIAGNOSIS — R293 Abnormal posture: Secondary | ICD-10-CM

## 2018-04-15 DIAGNOSIS — M6281 Muscle weakness (generalized): Secondary | ICD-10-CM

## 2018-04-15 DIAGNOSIS — M542 Cervicalgia: Secondary | ICD-10-CM

## 2018-04-15 NOTE — Therapy (Signed)
[facility] [address], [place], [place], [address] Phone: [phone]   Fax:  [phone]  Physical Therapy Treatment  Patient Details  Name: Kellie [name] MRN: [id number] Date of Birth: 1956/06/25 Referring Provider (PT): [name], [place]   Encounter Date: 04/15/2018  PT End of Session - 04/15/18 0906    Visit Number  6    Date for PT Re-Evaluation  05/01/18    Authorization Type  Medicare A and B    PT Start Time  0845    PT Stop Time  8242    PT Time Calculation (min)  40 min    Activity Tolerance  No increased pain;Patient tolerated treatment well    Behavior During Therapy  [facility] for tasks assessed/performed       Past Medical History:  Diagnosis Date  . Abnormal Pap smear of cervix   . Allergic rhinitis 11/08/2010  . Anxiety   . Asthma   . Bronchitis   . Herpes simplex of female genitalia   . Hyperlipidemia   . Mitral valve prolapse   . Osteoporosis 11/21/2011  . Urticaria     Past Surgical History:  Procedure Laterality Date  . CLOSED MANIPULATION SHOULDER  7&01/2005   x2, 30 days apart  . CLOSED MANIPULATION SHOULDER  2006   Left  . COLONOSCOPY  09/2012   Medium sized external hemorrhoids, few small divertic in left colon, otherwise normal colon (repeat 10 yrs)  . ESOPHAGOGASTRODUODENOSCOPY  09/2012   Normal (Dr. Ardis Hughs)  . LEEP  11/2004    There were no vitals filed for this visit.  Subjective Assessment - 04/15/18 0848    Subjective  Pt states that she is doing well. She has no pain currently.    Pertinent History  anxiety, asthma     Patient Stated Goals  decrease back pain     Currently in Pain?  No/denies    Pain Onset  1 to 4 weeks ago                       [facility] Adult PT Treatment/Exercise - 04/15/18 0001      Self-Care   Lifting  lifting box from underneath shelf x7 reps, PT providing cues for proper technique and importance of keeping box close to body      Exercises   Other Exercises   lat pull down 2x10 reps with #30      Lumbar Exercises: Standing   Row  Strengthening;Both;15 reps   2 sets, #25 power tower   Other Standing Lumbar Exercises  BUE pressdown with red TB 2x15 reps       Knee/Hip Exercises: Machines for Strengthening   Total Gym Leg Press  seat 5, BLE #90 x20 reps for warmup; BLE #125 2x15 reps       Knee/Hip Exercises: Standing   Hip Abduction  Both;2 sets;10 reps;Knee straight;Stengthening    Abduction Limitations  yellow TB around feet     Other Standing Knee Exercises  half kneel to stand with LE on bolster x10 reps, 1 UE support       Knee/Hip Exercises: Supine   Bridges Limitations  BLE bridge with back on mat table, 8# dumbbell across pelvis for additional resistance             PT Education - 04/15/18 0855    Education Details  technique with therex    Person(s) Educated  Patient    Methods  Explanation;Verbal cues    Comprehension  Verbalized understanding;Returned demonstration       PT Short Term Goals - 04/10/18 0909      PT SHORT TERM GOAL #1   Title  Pt will be independent with her initial HEP to improve flexibility and decrease pain.     Time  3    Period  Weeks    Status  Achieved      PT SHORT TERM GOAL #2   Title  Pt will report atleast 25% reduction in her pain with daily activity.     Baseline  pain is 40% improved     Time  3    Period  Weeks    Status  Achieved      PT SHORT TERM GOAL #3   Title  Pt will demo proper set up and use of lumbar roll while sitting, to improve posture and body mechanics throughout the day.    Time  3    Period  Weeks    Status  Achieved        PT Long Term Goals - 04/01/18 1224      PT LONG TERM GOAL #1   Title  Pt will be independent with her advanced HEP to allow for safe transition to a gym program for maintenance after d/c.     Time  6    Period  Weeks    Status  New      PT LONG TERM GOAL #2   Title  Pt will report atleast 50%  reduction in her mid/low back pain to allow her to return to exercise at the gym.    Time  6    Period  Weeks    Status  New      PT LONG TERM GOAL #3   Title  Pt will have improved BLE strength to atleast 4/5 MMT which will improve her efficiency with activity at home.    Time  6    Period  Weeks    Status  New      PT LONG TERM GOAL #4   Title  Pt will have improved thoracic rotation ROM to atleast 45 deg bilaterally, which will improve her ability to complete daily tasks without compensation in the cervical and lumbar spine.     Time  6    Period  Weeks    Status  New            Plan - 04/15/18 5643    Clinical Impression Statement  Today continued with therex progressions to improve LE/trunk strength. She was able to complete lat pulldowns this session for the complete set of 10 repetitions. She does demonstrate improved understanding of proper weight lifting mechanics with the equipment reviewed up to this point in her POC. No back pain was reported during this session and she was introduced to lunges with intermittent cuing required for proper mechanics. Will continue with current POC and therex progressions to increase pt's activity participation and transition safely to a gym program.     Rehab Potential  Good    PT Frequency  2x / week    PT Duration  6 weeks    PT Treatment/Interventions  ADLs/Self Care Home Management;Moist Heat;Electrical Stimulation;Cryotherapy;Therapeutic activities;Therapeutic exercise;Patient/family education;Manual techniques;Passive range of motion;Taping;Dry needling;Neuromuscular re-education    PT Next Visit Plan  f/u lifting mechanics; hip IR/ER mobility and strength; hip extensor strengthening    PT Home Exercise Plan  [email or web address]  Consulted and Agree with Plan of Care  Patient       Patient will benefit from skilled therapeutic intervention in order to improve the following deficits and impairments:  Decreased activity tolerance, Decreased  strength, Decreased safety awareness, Impaired flexibility, Postural dysfunction, Pain, Improper body mechanics, Decreased range of motion, Decreased endurance, Decreased balance, Decreased mobility, Increased muscle spasms, Hypomobility  Visit Diagnosis: Pain in thoracic spine  Muscle weakness (generalized)  Abnormal posture  Cervicalgia     Problem List Patient Active Problem List   Diagnosis Date Noted  . Recent skin changes 03/13/2018  . Periodic limb movement 03/13/2018  . Nocturnal leg cramps 03/13/2018  . Aortic insufficiency 01/10/2018  . Non-restorative sleep 01/10/2018  . Snoring 01/10/2018  . Atypical chest pain 10/22/2017  . Anxiety 10/22/2017  . History of loop electrical excision procedure (LEEP) 07/01/2017  . Unintentional weight loss 05/18/2017  . Elevated LDL cholesterol level 05/18/2017  . Hyperlipidemia 05/18/2017  . Irritable bowel syndrome with both constipation and diarrhea 05/18/2017  . Family history of pancreatic cancer 02/18/2017  . Diarrhea 02/18/2017  . Loss of weight 02/18/2017  . Weakness 10/05/2016  . Panic attacks 09/18/2016  . SOB (shortness of breath) 09/18/2016  . Bipolar 1 disorder with moderate mania ([place]) 09/17/2016  . Pulmonary nodule 08/08/2016  . Hoarseness 03/09/2016  . Left knee pain 11/09/2015  . Pain in joint, lower leg 11/09/2015  . Plantar fasciitis of left foot 11/09/2015  . Cough, persistent 07/21/2015  . Post-nasal drip 07/21/2015  . DDD (degenerative disc disease), cervical 07/05/2015  . Chronic nasal congestion 03/01/2015  . Vitreous floaters of right eye 03/01/2015  . Near syncope 02/09/2015  . Rhinitis, allergic 10/14/2014  . Family history of renal cancer 10/11/2014  . DDD (degenerative disc disease), lumbar 10/11/2014  . Bipolar disorder with depression ([place]) 06/02/2014  . Postmenopausal atrophic vaginitis 03/17/2014  . Prediabetes 12/14/2013  . Encounter for gynecological examination with Papanicolaou smear  of cervix 12/08/2013  . Adhesive capsulitis of right shoulder 11/24/2013  . Chronic periodontal disease 06/30/2013  . Genital herpes 11/05/2012  . Hemorrhoids 11/05/2012  . Borderline intellectual functioning 02/15/2012  . Osteoporosis 11/21/2011  . Allergic rhinitis 11/08/2010  . Insomnia 11/08/2010  . GERD (gastroesophageal reflux disease) 09/27/2010   9:28 AM,04/15/18 Sherol Dade PT, DPT [facility] [address] [place], [place], [address] Phone: [phone]   Fax:  [phone]  Name: VANNIA POLA MRN: [id number] Date of Birth: Dec 13, 1955

## 2018-04-17 ENCOUNTER — Encounter: Payer: Self-pay | Admitting: Physical Therapy

## 2018-04-17 ENCOUNTER — Ambulatory Visit (INDEPENDENT_AMBULATORY_CARE_PROVIDER_SITE_OTHER): Payer: Medicare Other

## 2018-04-17 DIAGNOSIS — I739 Peripheral vascular disease, unspecified: Secondary | ICD-10-CM

## 2018-04-17 NOTE — Progress Notes (Signed)
Call pt: right and left normal circulation in both legs and feet with no evidence of peripheral artery disease.

## 2018-04-23 DIAGNOSIS — D485 Neoplasm of uncertain behavior of skin: Secondary | ICD-10-CM | POA: Diagnosis not present

## 2018-04-23 DIAGNOSIS — L219 Seborrheic dermatitis, unspecified: Secondary | ICD-10-CM | POA: Diagnosis not present

## 2018-04-23 DIAGNOSIS — B351 Tinea unguium: Secondary | ICD-10-CM | POA: Diagnosis not present

## 2018-04-23 DIAGNOSIS — D229 Melanocytic nevi, unspecified: Secondary | ICD-10-CM | POA: Diagnosis not present

## 2018-04-24 ENCOUNTER — Ambulatory Visit: Payer: Medicare Other | Admitting: Physical Therapy

## 2018-04-24 ENCOUNTER — Telehealth: Payer: Self-pay

## 2018-04-24 NOTE — Telephone Encounter (Signed)
Kellie [name] called today saying that when she had her last mammogram, the detailed report on her MyChart said that she has dense breasts. I did call and notify her of the results when they first came in, but she was looking back over everything, and the dense breast tissue does concern her. She was wondering if any further testing could or should be done to help ease her about the dense breast tissue? Thanks!

## 2018-04-28 ENCOUNTER — Encounter (HOSPITAL_COMMUNITY): Payer: Self-pay | Admitting: Licensed Clinical Social Worker

## 2018-04-28 ENCOUNTER — Ambulatory Visit (INDEPENDENT_AMBULATORY_CARE_PROVIDER_SITE_OTHER): Payer: Medicare Other | Admitting: Licensed Clinical Social Worker

## 2018-04-28 DIAGNOSIS — F310 Bipolar disorder, current episode hypomanic: Secondary | ICD-10-CM
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
## 2018-04-28 NOTE — Telephone Encounter (Signed)
Can we call breast clinic and discuss concern of patient and see if they would advise any further imaging? Pt is worried because of dense breast the mammogram is not as sensitive.   She certainly could watch caffeine usage as well for dense breast.

## 2018-04-29 ENCOUNTER — Encounter: Payer: Self-pay | Admitting: Physical Therapy

## 2018-04-29 ENCOUNTER — Ambulatory Visit: Payer: Medicare Other | Attending: Sports Medicine | Admitting: Physical Therapy

## 2018-04-29 DIAGNOSIS — M6281 Muscle weakness (generalized): Secondary | ICD-10-CM | POA: Diagnosis present

## 2018-04-29 DIAGNOSIS — R293 Abnormal posture: Secondary | ICD-10-CM | POA: Insufficient documentation

## 2018-04-29 DIAGNOSIS — M546 Pain in thoracic spine: Secondary | ICD-10-CM | POA: Diagnosis not present

## 2018-04-29 DIAGNOSIS — M542 Cervicalgia: Secondary | ICD-10-CM | POA: Insufficient documentation

## 2018-04-29 NOTE — Therapy (Signed)
[facility] [address] [place], [place], [address] Phone: [phone]   Fax:  [phone]  Physical Therapy Treatment  Patient Details  Name: Kellie [name] MRN: [id number] Date of Birth: 1955-09-20 Referring Provider (PT): [name], [place]   Encounter Date: 04/29/2018  PT End of Session - 04/29/18 0913    Visit Number  7    Date for PT Re-Evaluation  05/01/18    Authorization Type  Medicare A and B    PT Start Time  0845    PT Stop Time  3149    PT Time Calculation (min)  40 min    Activity Tolerance  No increased pain;Patient tolerated treatment well    Behavior During Therapy  [facility] for tasks assessed/performed       Past Medical History:  Diagnosis Date  . Abnormal Pap smear of cervix   . Allergic rhinitis 11/08/2010  . Anxiety   . Asthma   . Bronchitis   . Herpes simplex of female genitalia   . Hyperlipidemia   . Mitral valve prolapse   . Osteoporosis 11/21/2011  . Urticaria     Past Surgical History:  Procedure Laterality Date  . CLOSED MANIPULATION SHOULDER  7&01/2005   x2, 30 days apart  . CLOSED MANIPULATION SHOULDER  2006   Left  . COLONOSCOPY  09/2012   Medium sized external hemorrhoids, few small divertic in left colon, otherwise normal colon (repeat 10 yrs)  . ESOPHAGOGASTRODUODENOSCOPY  09/2012   Normal (Dr. Ardis Hughs)  . LEEP  11/2004    There were no vitals filed for this visit.  Subjective Assessment - 04/29/18 0846    Subjective  Pt reports that she has been doing great. She has had no issues with her low back, but her ankles have been bothering her.     Pertinent History  anxiety, asthma     Patient Stated Goals  decrease back pain     Currently in Pain?  No/denies    Pain Onset  1 to 4 weeks ago                       [facility] Adult PT Treatment/Exercise - 04/29/18 0001      Exercises   Exercises  Lumbar    Other Exercises   lat pull down 2x10 reps with #30       Lumbar Exercises: Aerobic   Elliptical  L2, ramp 5       Knee/Hip Exercises: Machines for Strengthening   Total Gym Leg Press  seat 5, BLE #125 2x15      Knee/Hip Exercises: Standing   Hip Abduction  Stengthening;2 sets;15 reps    Abduction Limitations  yellow TB around feet     Other Standing Knee Exercises  standing firehydrants with yellow TB 2x10 reps each (BUE support)     Other Standing Knee Exercises  forward step up 6" with foam pad 2x15 reps without UE support       Knee/Hip Exercises: Sidelying   Hip ABduction  Left;Right;20 reps;1 set    Hip ABduction Limitations  yellow TB around knees              PT Education - 04/29/18 0912    Education Details  technique with therex; parameters of exercise progression    Person(s) Educated  Patient    Methods  Explanation;Verbal cues    Comprehension  Verbalized understanding;Returned demonstration  PT Short Term Goals - 04/10/18 0909      PT SHORT TERM GOAL #1   Title  Pt will be independent with her initial HEP to improve flexibility and decrease pain.     Time  3    Period  Weeks    Status  Achieved      PT SHORT TERM GOAL #2   Title  Pt will report atleast 25% reduction in her pain with daily activity.     Baseline  pain is 40% improved     Time  3    Period  Weeks    Status  Achieved      PT SHORT TERM GOAL #3   Title  Pt will demo proper set up and use of lumbar roll while sitting, to improve posture and body mechanics throughout the day.    Time  3    Period  Weeks    Status  Achieved        PT Long Term Goals - 04/29/18 2703      PT LONG TERM GOAL #1   Title  Pt will be independent with her advanced HEP to allow for safe transition to a gym program for maintenance after d/c.     Time  6    Period  Weeks    Status  New      PT LONG TERM GOAL #2   Title  Pt will report atleast 50% reduction in her mid/low back pain to allow her to return to exercise at the gym.    Time  6    Period  Weeks     Status  New      PT LONG TERM GOAL #3   Title  Pt will have improved BLE strength to atleast 4/5 MMT which will improve her efficiency with activity at home.    Time  6    Period  Weeks    Status  New      PT LONG TERM GOAL #4   Title  Pt will have improved thoracic rotation ROM to atleast 45 deg bilaterally, which will improve her ability to complete daily tasks without compensation in the cervical and lumbar spine.     Time  6    Period  Weeks    Status  New            Plan - 04/29/18 0931    Clinical Impression Statement  Pt is doing well, reporting no low back pain over the past week. She was able to help her sister move over the weekend without increase in pain which is an improvement from several weeks ago. Session continued with focus on therex to promote hip abduction and extension strength, and therapist discussed parameters for exercise with soon return to the gym. Pt's HEP was updated this session and pt demonstrated full understanding, will plan for reassessment and possible d/c next visit to transition pt to her advanced HEP.     Rehab Potential  Good    PT Frequency  2x / week    PT Duration  6 weeks    PT Treatment/Interventions  ADLs/Self Care Home Management;Moist Heat;Electrical Stimulation;Cryotherapy;Therapeutic activities;Therapeutic exercise;Patient/family education;Manual techniques;Passive range of motion;Taping;Dry needling;Neuromuscular re-education    PT Next Visit Plan  reassessment, d/c with advanced HEP    PT Home Exercise Plan  [email or web address]     Consulted and Agree with Plan of Care  Patient       Patient will benefit from skilled  therapeutic intervention in order to improve the following deficits and impairments:  Decreased activity tolerance, Decreased strength, Decreased safety awareness, Impaired flexibility, Postural dysfunction, Pain, Improper body mechanics, Decreased range of motion, Decreased endurance, Decreased balance, Decreased mobility,  Increased muscle spasms, Hypomobility  Visit Diagnosis: Pain in thoracic spine  Muscle weakness (generalized)  Abnormal posture     Problem List Patient Active Problem List   Diagnosis Date Noted  . Recent skin changes 03/13/2018  . Periodic limb movement 03/13/2018  . Nocturnal leg cramps 03/13/2018  . Aortic insufficiency 01/10/2018  . Non-restorative sleep 01/10/2018  . Snoring 01/10/2018  . Atypical chest pain 10/22/2017  . Anxiety 10/22/2017  . History of loop electrical excision procedure (LEEP) 07/01/2017  . Unintentional weight loss 05/18/2017  . Elevated LDL cholesterol level 05/18/2017  . Hyperlipidemia 05/18/2017  . Irritable bowel syndrome with both constipation and diarrhea 05/18/2017  . Family history of pancreatic cancer 02/18/2017  . Diarrhea 02/18/2017  . Loss of weight 02/18/2017  . Weakness 10/05/2016  . Panic attacks 09/18/2016  . SOB (shortness of breath) 09/18/2016  . Bipolar 1 disorder with moderate mania ([place]) 09/17/2016  . Pulmonary nodule 08/08/2016  . Hoarseness 03/09/2016  . Left knee pain 11/09/2015  . Pain in joint, lower leg 11/09/2015  . Plantar fasciitis of left foot 11/09/2015  . Cough, persistent 07/21/2015  . Post-nasal drip 07/21/2015  . DDD (degenerative disc disease), cervical 07/05/2015  . Chronic nasal congestion 03/01/2015  . Vitreous floaters of right eye 03/01/2015  . Near syncope 02/09/2015  . Rhinitis, allergic 10/14/2014  . Family history of renal cancer 10/11/2014  . DDD (degenerative disc disease), lumbar 10/11/2014  . Bipolar disorder with depression ([place]) 06/02/2014  . Postmenopausal atrophic vaginitis 03/17/2014  . Prediabetes 12/14/2013  . Encounter for gynecological examination with Papanicolaou smear of cervix 12/08/2013  . Adhesive capsulitis of right shoulder 11/24/2013  . Chronic periodontal disease 06/30/2013  . Genital herpes 11/05/2012  . Hemorrhoids 11/05/2012  . Borderline intellectual functioning  02/15/2012  . Osteoporosis 11/21/2011  . Allergic rhinitis 11/08/2010  . Insomnia 11/08/2010  . GERD (gastroesophageal reflux disease) 09/27/2010   9:34 AM,04/29/18 Sherol Dade PT, DPT [place] [facility] [address] [place], [place], [address] Phone: [phone]   Fax:  [phone]  Name: Kellie [name] MRN: [id number] Date of Birth: June 17, 1956

## 2018-04-30 ENCOUNTER — Ambulatory Visit (INDEPENDENT_AMBULATORY_CARE_PROVIDER_SITE_OTHER): Payer: Medicare Other | Admitting: Physician Assistant

## 2018-04-30 ENCOUNTER — Encounter: Payer: Self-pay | Admitting: Physician Assistant

## 2018-04-30 VITALS — BP 103/69 | HR 75 | Temp 98.6°F | Ht 66.0 in | Wt 111.0 lb

## 2018-04-30 DIAGNOSIS — J069 Acute upper respiratory infection, unspecified: Secondary | ICD-10-CM

## 2018-04-30 DIAGNOSIS — Z136 Encounter for screening for cardiovascular disorders: Secondary | ICD-10-CM

## 2018-04-30 DIAGNOSIS — R922 Inconclusive mammogram: Secondary | ICD-10-CM | POA: Diagnosis not present

## 2018-04-30 MED ORDER — METHYLPREDNISOLONE SODIUM SUCC 125 MG IJ SOLR
125.0000 mg | Freq: Once | INTRAMUSCULAR | Status: AC
  Administered 2018-04-30: 125 mg via INTRAMUSCULAR

## 2018-04-30 NOTE — Progress Notes (Signed)
Subjective:    Patient ID: Kellie [name], female    DOB: 09-Apr-1956, 62 y.o.   MRN: [id number]  HPI  Pt is a 62 yo female with asthma and allergic rhinitis who presents to the clinic to discuss sinus congestion/pressue/drainage and cough for last week. Throat feels irritated and ears have a lot of pressure and feel "hot". No fever, chills, body aches. Cough is not productive.   She had a recent mammogram and showed dense breast. She is concerned and wants to know if there is any other imaging we can do to be sure her breast are fine.   She also request carotid doppler. Her sister had some stenosis and she wants to know her risk.   .. Active Ambulatory Problems    Diagnosis Date Noted  . GERD (gastroesophageal reflux disease) 09/27/2010  . Allergic rhinitis 11/08/2010  . Insomnia 11/08/2010  . Osteoporosis 11/21/2011  . Borderline intellectual functioning 02/15/2012  . Genital herpes 11/05/2012  . Hemorrhoids 11/05/2012  . Chronic periodontal disease 06/30/2013  . Adhesive capsulitis of right shoulder 11/24/2013  . Encounter for gynecological examination with Papanicolaou smear of cervix 12/08/2013  . Prediabetes 12/14/2013  . Postmenopausal atrophic vaginitis 03/17/2014  . Bipolar disorder with depression ([place]) 06/02/2014  . Family history of renal cancer 10/11/2014  . DDD (degenerative disc disease), lumbar 10/11/2014  . Rhinitis, allergic 10/14/2014  . Near syncope 02/09/2015  . Chronic nasal congestion 03/01/2015  . Vitreous floaters of right eye 03/01/2015  . DDD (degenerative disc disease), cervical 07/05/2015  . Cough, persistent 07/21/2015  . Post-nasal drip 07/21/2015  . Left knee pain 11/09/2015  . Pain in joint, lower leg 11/09/2015  . Plantar fasciitis of left foot 11/09/2015  . Hoarseness 03/09/2016  . Pulmonary nodule 08/08/2016  . Bipolar 1 disorder with moderate mania ([place]) 09/17/2016  . Panic attacks 09/18/2016  . SOB (shortness of breath) 09/18/2016  .  Weakness 10/05/2016  . Family history of pancreatic cancer 02/18/2017  . Diarrhea 02/18/2017  . Loss of weight 02/18/2017  . Unintentional weight loss 05/18/2017  . Elevated LDL cholesterol level 05/18/2017  . Hyperlipidemia 05/18/2017  . Irritable bowel syndrome with both constipation and diarrhea 05/18/2017  . History of loop electrical excision procedure (LEEP) 07/01/2017  . Atypical chest pain 10/22/2017  . Anxiety 10/22/2017  . Aortic insufficiency 01/10/2018  . Non-restorative sleep 01/10/2018  . Snoring 01/10/2018  . Recent skin changes 03/13/2018  . Periodic limb movement 03/13/2018  . Nocturnal leg cramps 03/13/2018   Resolved Ambulatory Problems    Diagnosis Date Noted  . Sinusitis 09/27/2010  . Depression 09/27/2010  . Bronchitis 12/04/2010  . Cough 06/02/2011  . General medical examination 10/18/2011  . Dizziness 11/21/2011  . Knee pain 11/21/2011  . Low back pain 11/21/2011  . Heel pain 07/14/2012  . Thoracic myofascial strain 03/13/2013  . Sinusitis 04/20/2013  . Cough 04/22/2013  . Rotator cuff disorder 08/01/2013  . Right shoulder pain 10/27/2013  . Adhesive capsulitis 11/02/2013  . Pain in joint, shoulder region 11/24/2013  . Bipolar disorder ([place]) 04/29/2014  . Right serous otitis media 08/03/2014  . Acute recurrent maxillary sinusitis 10/14/2014  . Right-sided low back pain without sciatica 10/14/2014  . Sinusitis 02/09/2015  . Sacroiliac joint dysfunction of right side 02/25/2015  . Piriformis syndrome 03/01/2015  . Adhesive capsulitis 07/05/2015  . Radiculitis of right cervical region 07/06/2015  . Drug reaction 09/25/2016  . Mitral valve prolapse 01/10/2018   Past Medical History:  Diagnosis  Date  . Abnormal Pap smear of cervix   . Allergic rhinitis 11/08/2010  . Asthma   . Herpes simplex of female genitalia   . Urticaria        Review of Systems  All other systems reviewed and are negative.      Objective:   Physical Exam   Constitutional: She is oriented to person, place, and time. She appears well-developed and well-nourished.  HENT:  Head: Normocephalic and atraumatic.  Right Ear: External ear normal.  Left Ear: External ear normal.  TM"s clear bilaterally.  Oropharynx with some clear PND. No tonsil enlargement or exudate.  Bilateral nares swollen and red.  Tenderness over maxillary sinuses.   Eyes: Conjunctivae are normal. Right eye exhibits no discharge. Left eye exhibits no discharge.  Cardiovascular: Normal rate and regular rhythm.  Pulmonary/Chest: Effort normal and breath sounds normal. She has no wheezes.  Lymphadenopathy:    She has no cervical adenopathy.  Neurological: She is alert and oriented to person, place, and time.  Skin: No rash noted.  Psychiatric: She has a normal mood and affect. Her behavior is normal.          Assessment & Plan:  [name] KitchenMarland KitchenDiagnoses and all orders for this visit:  URI with cough and congestion -     methylPREDNISolone sodium succinate (SOLU-MEDROL) 125 mg/2 mL injection 125 mg  Encounter for screening for stenosis of carotid artery -     US Carotid Duplex Bilateral  Dense breast tissue on mammogram   Appears like patient has some allergies or viral infection. Solumedrol given today. Discussed symptomatic care. HO given. Start using atrovent again. Rest and hydrate.   Ordered carotid doppler at her request. No hx of CV event. LDL 114.   Called imaging and they got back to [place]. Dense breast is not a reason for any additional imaging and she will have to pay out of pocket if she would like more imaging. Pt understands. She does not want any imaging ordered.

## 2018-04-30 NOTE — Telephone Encounter (Signed)
[facility] in [place]. Spoke with [place] and they are going to check with clinical supervisor to see if there are any additional screenings that are done. Per protocol, the only time additional imaging is ordered or needed is if pt is having specific breast issues, in which case they would do a diagnostic mammogram (which would cost the patient money because it would go towards deductible).   Awaiting call back from clinical supervisor

## 2018-04-30 NOTE — Telephone Encounter (Signed)
Kellie [name]. From breast center called to advise that she and the reading provider spoke to pt and let her know that screening bilateral U/S is not protocol,  Since only risk factor at this time is dense breasts.   Will call and let pt know she can call to see if other imaging location will do screening bilateral U/S or if she wants to have a breast MRI, she can have provider order that but more than likely this will NOT be covered by insurance.  Giving patient these options and information to call office is she is okay to pay out of pocket for further imaging

## 2018-04-30 NOTE — Patient Instructions (Signed)
Upper Respiratory Infection, Adult Most upper respiratory infections (URIs) are caused by a virus. A URI affects the nose, throat, and upper air passages. The most common type of URI is often called "the common cold." Follow these instructions at home:  Take medicines only as told by your doctor.  Gargle warm saltwater or take cough drops to comfort your throat as told by your doctor.  Use a warm mist humidifier or inhale steam from a shower to increase air moisture. This may make it easier to breathe.  Drink enough fluid to keep your pee (urine) clear or pale yellow.  Eat soups and other clear broths.  Have a healthy diet.  Rest as needed.  Go back to work when your fever is gone or your doctor says it is okay. ? You may need to stay home longer to avoid giving your URI to others. ? You can also wear a face mask and wash your hands often to prevent spread of the virus.  Use your inhaler more if you have asthma.  Do not use any tobacco products, including cigarettes, chewing tobacco, or electronic cigarettes. If you need help quitting, ask your doctor. Contact a doctor if:  You are getting worse, not better.  Your symptoms are not helped by medicine.  You have chills.  You are getting more short of breath.  You have brown or red mucus.  You have yellow or brown discharge from your nose.  You have pain in your face, especially when you bend forward.  You have a fever.  You have puffy (swollen) neck glands.  You have pain while swallowing.  You have white areas in the back of your throat. Get help right away if:  You have very bad or constant: ? Headache. ? Ear pain. ? Pain in your forehead, behind your eyes, and over your cheekbones (sinus pain). ? Chest pain.  You have long-lasting (chronic) lung disease and any of the following: ? Wheezing. ? Long-lasting cough. ? Coughing up blood. ? A change in your usual mucus.  You have a stiff neck.  You have  changes in your: ? Vision. ? Hearing. ? Thinking. ? Mood. This information is not intended to replace advice given to you by your health care provider. Make sure you discuss any questions you have with your health care provider. Document Released: 11/28/2007 Document Revised: 02/12/2016 Document Reviewed: 09/16/2013 Elsevier Interactive Patient Education  2018 Elsevier Inc.  

## 2018-05-01 ENCOUNTER — Ambulatory Visit: Payer: Medicare Other | Admitting: Physical Therapy

## 2018-05-01 ENCOUNTER — Encounter: Payer: Self-pay | Admitting: Physical Therapy

## 2018-05-01 DIAGNOSIS — M542 Cervicalgia: Secondary | ICD-10-CM

## 2018-05-01 DIAGNOSIS — R293 Abnormal posture: Secondary | ICD-10-CM

## 2018-05-01 DIAGNOSIS — M546 Pain in thoracic spine: Secondary | ICD-10-CM | POA: Diagnosis not present

## 2018-05-01 DIAGNOSIS — M6281 Muscle weakness (generalized): Secondary | ICD-10-CM | POA: Diagnosis not present

## 2018-05-01 NOTE — Therapy (Addendum)
[facility] [address], [place], [place], [address] Phone: [phone]   Fax:  [phone]  Physical Therapy Treatment/Discharge  Patient Details  Name: Kellie [name] MRN: [id number] Date of Birth: 1956-04-11 Referring Provider (PT): [name], [place]   Encounter Date: 05/01/2018  PT End of Session - 05/01/18 0936    Visit Number  8    Date for PT Re-Evaluation  05/01/18    Authorization Type  Medicare A and B    PT Start Time  4174    PT Stop Time  0814   pt requested end early due to not feeling well    PT Time Calculation (min)  19 min    Activity Tolerance  No increased pain;Patient tolerated treatment well    Behavior During Therapy  [facility] for tasks assessed/performed       Past Medical History:  Diagnosis Date  . Abnormal Pap smear of cervix   . Allergic rhinitis 11/08/2010  . Anxiety   . Asthma   . Bronchitis   . Herpes simplex of female genitalia   . Hyperlipidemia   . Mitral valve prolapse   . Osteoporosis 11/21/2011  . Urticaria     Past Surgical History:  Procedure Laterality Date  . CLOSED MANIPULATION SHOULDER  7&01/2005   x2, 30 days apart  . CLOSED MANIPULATION SHOULDER  2006   Left  . COLONOSCOPY  09/2012   Medium sized external hemorrhoids, few small divertic in left colon, otherwise normal colon (repeat 10 yrs)  . ESOPHAGOGASTRODUODENOSCOPY  09/2012   Normal (Dr. Ardis Hughs)  . LEEP  11/2004    There were no vitals filed for this visit.  Subjective Assessment - 05/01/18 0932    Subjective  Pt states that things are going well. She has no issues with her back, other than her ankles. She has not been able to get to the gym due to her sickness, but plans to go next week.     Pertinent History  anxiety, asthma     Patient Stated Goals  decrease back pain     Currently in Pain?  No/denies    Pain Onset  1 to 4 weeks ago         [facility] PT Assessment - 05/01/18 0001      Assessment    Medical Diagnosis  DDD    Referring Provider (PT)  [name], PA-C    Onset Date/Surgical Date  --   3-4 weeks ago   Prior Therapy  ~2 months ago for same       Precautions   Precautions  None      Balance Screen   Has the patient fallen in the past 6 months  No    Has the patient had a decrease in activity level because of a fear of falling?   No    Is the patient reluctant to leave their home because of a fear of falling?   No      Home Film/video editor residence    Living Arrangements  Alone      Prior Function   Vocation  On disability      Sensation   Additional Comments  denies numbness/tingling       AROM   Lumbar Flexion  pain free    Lumbar Extension  pain free    Lumbar - Right Rotation  pain free    Lumbar - Left Rotation  pain  free    Thoracic - Right Rotation  30 deg     Thoracic - Left Rotation  20 deg      Strength   Right Hip Flexion  5/5    Right Hip Extension  5/5    Right Hip ABduction  4+/5    Left Hip Flexion  5/5    Left Hip Extension  5/5    Left Hip ABduction  4+/5    Right Knee Flexion  5/5    Right Knee Extension  5/5    Left Knee Flexion  5/5    Left Knee Extension  5/5    Right Ankle Dorsiflexion  5/5    Left Ankle Dorsiflexion  5/5      Flexibility   Soft Tissue Assessment /Muscle Length  yes    Hamstrings  WNL    Quadriceps  WNL      Palpation   Palpation comment  non tender along thoracic and lumbar paraspinals       Ambulation/Gait   Gait Comments  --      High Level Balance   High Level Balance Comments  SLS: Lt 15 sec, Rt 15 sec                            PT Education - 05/01/18 0935    Education Details  updated HEP    Person(s) Educated  Patient    Methods  Explanation;Handout    Comprehension  Returned demonstration;Verbalized understanding       PT Short Term Goals - 05/01/18 0946      PT SHORT TERM GOAL #1   Title  Pt will be independent with her initial HEP  to improve flexibility and decrease pain.     Time  3    Period  Weeks    Status  Achieved      PT SHORT TERM GOAL #2   Title  Pt will report atleast 25% reduction in her pain with daily activity.     Baseline  90% improved     Time  3    Period  Weeks    Status  Achieved      PT SHORT TERM GOAL #3   Title  Pt will demo proper set up and use of lumbar roll while sitting, to improve posture and body mechanics throughout the day.    Time  3    Period  Weeks    Status  Achieved        PT Long Term Goals - 05/01/18 0946      PT LONG TERM GOAL #1   Title  Pt will be independent with her advanced HEP to allow for safe transition to a gym program for maintenance after d/c.     Time  6    Period  Weeks    Status  Achieved      PT LONG TERM GOAL #2   Title  Pt will report atleast 50% reduction in her mid/low back pain to allow her to return to exercise at the gym.    Baseline  90% improved     Time  6    Period  Weeks    Status  Achieved      PT LONG TERM GOAL #3   Title  Pt will have improved BLE strength to atleast 4/5 MMT which will improve her efficiency with activity at home.    Baseline  4+/5  MMT hip abduction    Time  6    Period  Weeks    Status  Achieved      PT LONG TERM GOAL #4   Title  Pt will have improved thoracic rotation ROM to atleast 45 deg bilaterally, which will improve her ability to complete daily tasks without compensation in the cervical and lumbar spine.     Time  6    Period  Weeks    Status  Achieved            Plan - 05/01/18 0955    Clinical Impression Statement  Pt was discharged this visit having met all of her short and long term goals since starting PT. She has had no low back pain for the past 2 weeks and has been completing her HEP regularly to progress her LE/trunk strength. She feels she is atleast 90% improved and she is able to practice proper lifting mechanics at the store. She has pain free lumbar active ROM, and her hip  strength is near 5/5 MMT. Some remaining limitations in hip abductor/extensor strength will continue to improve with further HEP adherence and plans to start her prior gym program. Pt is pleased with her progress and agrees with PT d/c at this time.    Rehab Potential  Good    PT Frequency  2x / week    PT Duration  6 weeks    PT Treatment/Interventions  ADLs/Self Care Home Management;Moist Heat;Electrical Stimulation;Cryotherapy;Therapeutic activities;Therapeutic exercise;Patient/family education;Manual techniques;Passive range of motion;Taping;Dry needling;Neuromuscular re-education    PT Next Visit Plan  d/c home    PT Home Exercise Plan  [id number]     Consulted and Agree with Plan of Care  Patient       Patient will benefit from skilled therapeutic intervention in order to improve the following deficits and impairments:  Decreased activity tolerance, Decreased strength, Decreased safety awareness, Impaired flexibility, Postural dysfunction, Pain, Improper body mechanics, Decreased range of motion, Decreased endurance, Decreased balance, Decreased mobility, Increased muscle spasms, Hypomobility  Visit Diagnosis: Pain in thoracic spine  Muscle weakness (generalized)  Abnormal posture  Cervicalgia     Problem List Patient Active Problem List   Diagnosis Date Noted  . Recent skin changes 03/13/2018  . Periodic limb movement 03/13/2018  . Nocturnal leg cramps 03/13/2018  . Aortic insufficiency 01/10/2018  . Non-restorative sleep 01/10/2018  . Snoring 01/10/2018  . Atypical chest pain 10/22/2017  . Anxiety 10/22/2017  . History of loop electrical excision procedure (LEEP) 07/01/2017  . Unintentional weight loss 05/18/2017  . Elevated LDL cholesterol level 05/18/2017  . Hyperlipidemia 05/18/2017  . Irritable bowel syndrome with both constipation and diarrhea 05/18/2017  . Family history of pancreatic cancer 02/18/2017  . Diarrhea 02/18/2017  . Loss of weight 02/18/2017  .  Weakness 10/05/2016  . Panic attacks 09/18/2016  . SOB (shortness of breath) 09/18/2016  . Bipolar 1 disorder with moderate mania ([place]) 09/17/2016  . Pulmonary nodule 08/08/2016  . Hoarseness 03/09/2016  . Left knee pain 11/09/2015  . Pain in joint, lower leg 11/09/2015  . Plantar fasciitis of left foot 11/09/2015  . Cough, persistent 07/21/2015  . Post-nasal drip 07/21/2015  . DDD (degenerative disc disease), cervical 07/05/2015  . Chronic nasal congestion 03/01/2015  . Vitreous floaters of right eye 03/01/2015  . Near syncope 02/09/2015  . Rhinitis, allergic 10/14/2014  . Family history of renal cancer 10/11/2014  . DDD (degenerative disc disease), lumbar 10/11/2014  . Bipolar disorder with depression (  [place]) 06/02/2014  . Postmenopausal atrophic vaginitis 03/17/2014  . Prediabetes 12/14/2013  . Encounter for gynecological examination with Papanicolaou smear of cervix 12/08/2013  . Adhesive capsulitis of right shoulder 11/24/2013  . Chronic periodontal disease 06/30/2013  . Genital herpes 11/05/2012  . Hemorrhoids 11/05/2012  . Borderline intellectual functioning 02/15/2012  . Osteoporosis 11/21/2011  . Allergic rhinitis 11/08/2010  . Insomnia 11/08/2010  . GERD (gastroesophageal reflux disease) 09/27/2010   PHYSICAL THERAPY DISCHARGE SUMMARY  Visits from Start of Care: 8  Current functional level related to goals / functional outcomes: See above for more details    Remaining deficits: See above for more details    Education / Equipment: See above for more details   Plan: Patient agrees to discharge.  Patient goals were met. Patient is being discharged due to meeting the stated rehab goals.  ?????          10:00 AM,05/01/18 [place], [place] at [place]  [facility] [address] [place], [place], [address] Phone: [phone]   Fax:   [phone]  Name: Kellie [name] MRN: [id number] Date of Birth: [birth year]

## 2018-05-01 NOTE — Telephone Encounter (Signed)
Options given to patient, she declines further imaging at this time.

## 2018-05-02 ENCOUNTER — Encounter: Payer: Self-pay | Admitting: Physician Assistant

## 2018-05-02 ENCOUNTER — Telehealth: Payer: Self-pay

## 2018-05-02 DIAGNOSIS — R05 Cough: Secondary | ICD-10-CM

## 2018-05-02 DIAGNOSIS — R053 Chronic cough: Secondary | ICD-10-CM

## 2018-05-02 MED ORDER — AZITHROMYCIN 250 MG PO TABS: ORAL_TABLET | ORAL | 0 refills | Status: DC

## 2018-05-02 MED ORDER — PREDNISONE 50 MG PO TABS: ORAL_TABLET | ORAL | 0 refills | Status: DC

## 2018-05-02 MED ORDER — BENZONATATE 200 MG PO CAPS: 200.0000 mg | ORAL_CAPSULE | Freq: Three times a day (TID) | ORAL | 0 refills | Status: DC | PRN

## 2018-05-02 NOTE — Assessment & Plan Note (Addendum)
Acute bronchitis resistant to conservative measures for over a week, adding azithromycin, prednisone, Tessalon Perles. Go ahead and also get a chest x-ray, she should get it done as soon as she can.

## 2018-05-02 NOTE — Telephone Encounter (Signed)
No problem, sounds like acute bronchitis, adding azithromycin, prednisone.  Tessalon Perles for cough.  Go ahead and also get a chest x-ray, she should get it done as soon as she can.

## 2018-05-02 NOTE — Telephone Encounter (Signed)
Kellie [name] is one of Jade's patients. She came into the office earlier this week for a cold and cough. We gave an injection of Solu-medrol, and she was told to follow-up later in the week if not any better. Patient called today saying that her symptoms are still bad. She is coughing up green mucus and has lost her voice. She was wanting to see if there is anything we can call out to help her? Thanks!

## 2018-05-05 ENCOUNTER — Ambulatory Visit (INDEPENDENT_AMBULATORY_CARE_PROVIDER_SITE_OTHER): Payer: Medicare Other

## 2018-05-05 DIAGNOSIS — R05 Cough: Secondary | ICD-10-CM

## 2018-05-05 DIAGNOSIS — R053 Chronic cough: Secondary | ICD-10-CM

## 2018-05-05 NOTE — Telephone Encounter (Signed)
Pt advised. She got Rx's this weekend, she has been taking them. She will get xray today.

## 2018-05-08 ENCOUNTER — Telehealth: Payer: Self-pay

## 2018-05-08 MED ORDER — ESOMEPRAZOLE MAGNESIUM 40 MG PO PACK: 40.0000 mg | PACK | Freq: Every day | ORAL | 3 refills | Status: DC

## 2018-05-08 NOTE — Telephone Encounter (Signed)
Sent nexium. Do not take with omeprazole. It is also on your list so I don't want you to take together.

## 2018-05-08 NOTE — Telephone Encounter (Signed)
Kellie [name] called this morning saying that she is having bad GERD symptoms, and was wanting to know if we could call her out some nexium for her to take? Just wanted to ask you and see. Thanks!

## 2018-05-09 ENCOUNTER — Telehealth: Payer: Self-pay | Admitting: Family Medicine

## 2018-05-09 ENCOUNTER — Ambulatory Visit (HOSPITAL_BASED_OUTPATIENT_CLINIC_OR_DEPARTMENT_OTHER)
Admission: RE | Admit: 2018-05-09 | Discharge: 2018-05-09 | Disposition: A | Discharge status: Home / Self Care | Payer: Medicare Other | Source: Ambulatory Visit | Attending: Physician Assistant | Admitting: Physician Assistant

## 2018-05-09 DIAGNOSIS — Z136 Encounter for screening for cardiovascular disorders: Secondary | ICD-10-CM | POA: Insufficient documentation

## 2018-05-09 DIAGNOSIS — I6523 Occlusion and stenosis of bilateral carotid arteries: Secondary | ICD-10-CM | POA: Insufficient documentation

## 2018-05-09 DIAGNOSIS — E785 Hyperlipidemia, unspecified: Secondary | ICD-10-CM | POA: Diagnosis not present

## 2018-05-09 NOTE — Progress Notes (Signed)
Carotid doppler with very minimal plaque. Great news.

## 2018-05-09 NOTE — Telephone Encounter (Signed)
I called the patient to let her know about results and then the patient stated that she was feeling worse and she had a cold in her chest and Jade had to call something in today. She was very rude and hateful on the phone when I advised her that she could go online and preform an E-visit or she could be seen at Urgent Care.  She then asked to speak with Luvenia Starch and Myriam Jacobson which I advised her that they were both out of the office. She then stated you are going to send something into the pharmacy before the weeked because I can't wait all weekend to get some relief. I then re-emphasized to her the E-visit and Urgent Care. I also advised Since she was having new symptoms she would need to be seen in office. I attempted to get her scheduled and she stated " I'm calling Jade on Monday and we will get this resolved because she knows what I need and its Doxycycline and this gets me better since I have cold in my chest". The call was disconnected.  Dr. Madilyn Fireman was made aware and she knows the history.

## 2018-05-21 ENCOUNTER — Telehealth: Payer: Self-pay

## 2018-05-21 MED ORDER — PANTOPRAZOLE SODIUM 40 MG PO TBEC: 40.0000 mg | DELAYED_RELEASE_TABLET | Freq: Every day | ORAL | 1 refills | Status: DC

## 2018-05-21 NOTE — Telephone Encounter (Signed)
Ok to send 40mg  #90 with 1 refill.

## 2018-05-21 NOTE — Telephone Encounter (Signed)
RX sent, pt advised

## 2018-05-21 NOTE — Telephone Encounter (Signed)
Pt called- Nexium has not been working as well for her lately. She started taking some Protonix 40 that a friend had and it seems to be working much better.   OK to change RX and send to pharmacy?

## 2018-05-27 ENCOUNTER — Other Ambulatory Visit: Payer: Self-pay | Admitting: Physician Assistant

## 2018-05-27 DIAGNOSIS — F419 Anxiety disorder, unspecified: Secondary | ICD-10-CM

## 2018-05-27 DIAGNOSIS — F410 Panic disorder [episodic paroxysmal anxiety] without agoraphobia: Secondary | ICD-10-CM

## 2018-06-02 ENCOUNTER — Ambulatory Visit (HOSPITAL_COMMUNITY): Payer: Self-pay | Admitting: Licensed Clinical Social Worker

## 2018-06-27 ENCOUNTER — Other Ambulatory Visit: Payer: Self-pay | Admitting: Physician Assistant

## 2018-06-27 DIAGNOSIS — F419 Anxiety disorder, unspecified: Secondary | ICD-10-CM

## 2018-06-27 DIAGNOSIS — F410 Panic disorder [episodic paroxysmal anxiety] without agoraphobia: Secondary | ICD-10-CM

## 2018-06-27 NOTE — Telephone Encounter (Signed)
Requesting RF on Xanax  Last RF sent 05/27/18 for #30 no RF  Due for follow up   RX pended, please send if appropriate

## 2018-07-01 ENCOUNTER — Other Ambulatory Visit: Payer: Self-pay | Admitting: Physician Assistant

## 2018-07-01 DIAGNOSIS — R634 Abnormal weight loss: Secondary | ICD-10-CM

## 2018-07-01 DIAGNOSIS — F5101 Primary insomnia: Secondary | ICD-10-CM

## 2018-07-17 ENCOUNTER — Ambulatory Visit (INDEPENDENT_AMBULATORY_CARE_PROVIDER_SITE_OTHER): Payer: Medicare HMO | Admitting: Licensed Clinical Social Worker

## 2018-07-17 ENCOUNTER — Encounter (HOSPITAL_COMMUNITY): Payer: Self-pay | Admitting: Licensed Clinical Social Worker

## 2018-07-17 DIAGNOSIS — F310 Bipolar disorder, current episode hypomanic: Secondary | ICD-10-CM
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
## 2018-07-23 ENCOUNTER — Other Ambulatory Visit (HOSPITAL_COMMUNITY): Payer: Self-pay | Admitting: Psychiatry

## 2018-08-14 ENCOUNTER — Other Ambulatory Visit: Payer: Self-pay | Admitting: Physician Assistant

## 2018-08-18 ENCOUNTER — Ambulatory Visit (HOSPITAL_COMMUNITY): Payer: Medicare HMO | Admitting: Licensed Clinical Social Worker

## 2018-09-02 ENCOUNTER — Ambulatory Visit (INDEPENDENT_AMBULATORY_CARE_PROVIDER_SITE_OTHER): Payer: Medicare HMO | Admitting: Physician Assistant

## 2018-09-02 ENCOUNTER — Encounter: Payer: Self-pay | Admitting: Physician Assistant

## 2018-09-02 VITALS — BP 101/56 | HR 84 | Temp 98.2°F | Wt 114.4 lb

## 2018-09-02 DIAGNOSIS — J329 Chronic sinusitis, unspecified: Secondary | ICD-10-CM | POA: Diagnosis not present

## 2018-09-02 DIAGNOSIS — M25561 Pain in right knee: Secondary | ICD-10-CM | POA: Diagnosis not present

## 2018-09-02 DIAGNOSIS — R0981 Nasal congestion: Secondary | ICD-10-CM | POA: Diagnosis not present

## 2018-09-02 DIAGNOSIS — M25562 Pain in left knee: Secondary | ICD-10-CM

## 2018-09-02 DIAGNOSIS — R05 Cough: Secondary | ICD-10-CM | POA: Diagnosis not present

## 2018-09-02 DIAGNOSIS — M810 Age-related osteoporosis without current pathological fracture: Secondary | ICD-10-CM

## 2018-09-02 DIAGNOSIS — R0602 Shortness of breath: Secondary | ICD-10-CM | POA: Diagnosis not present

## 2018-09-02 DIAGNOSIS — R059 Cough, unspecified: Secondary | ICD-10-CM

## 2018-09-02 DIAGNOSIS — L219 Seborrheic dermatitis, unspecified: Secondary | ICD-10-CM

## 2018-09-02 DIAGNOSIS — K2100 Gastro-esophageal reflux disease with esophagitis, without bleeding: Secondary | ICD-10-CM

## 2018-09-02 DIAGNOSIS — G8929 Other chronic pain: Secondary | ICD-10-CM

## 2018-09-02 DIAGNOSIS — N898 Other specified noninflammatory disorders of vagina: Secondary | ICD-10-CM | POA: Diagnosis not present

## 2018-09-02 DIAGNOSIS — F310 Bipolar disorder, current episode hypomanic: Secondary | ICD-10-CM

## 2018-09-02 MED ORDER — ESTROGENS, CONJUGATED 0.625 MG/GM VA CREA: TOPICAL_CREAM | VAGINAL | 12 refills | Status: DC

## 2018-09-02 MED ORDER — PANTOPRAZOLE SODIUM 40 MG PO TBEC: DELAYED_RELEASE_TABLET | ORAL | 3 refills | Status: DC

## 2018-09-02 MED ORDER — KETOCONAZOLE 2 % EX SHAM: 1.0000 "application " | MEDICATED_SHAMPOO | CUTANEOUS | 1 refills | Status: DC

## 2018-09-02 MED ORDER — ALENDRONATE SODIUM 70 MG PO TABS: 70.0000 mg | ORAL_TABLET | ORAL | 11 refills | Status: DC

## 2018-09-02 MED ORDER — ALBUTEROL SULFATE HFA 108 (90 BASE) MCG/ACT IN AERS: 2.0000 | INHALATION_SPRAY | Freq: Four times a day (QID) | RESPIRATORY_TRACT | 0 refills | Status: DC | PRN

## 2018-09-02 NOTE — Patient Instructions (Signed)
Seborrheic Dermatitis, Adult  Seborrheic dermatitis is a skin disease that causes red, scaly patches. It usually occurs on the scalp, and it is often called dandruff. The patches may appear on other parts of the body. Skin patches tend to appear where there are many oil glands in the skin. Areas of the body that are commonly affected include:   Scalp.   Skin folds of the body.   Ears.   Eyebrows.   Neck.   Face.   Armpits.   The bearded area of men's faces.  The condition may come and go for no known reason, and it is often long-lasting (chronic).  What are the causes?  The cause of this condition is not known.  What increases the risk?  This condition is more likely to develop in people who:   Have certain conditions, such as:  ? HIV (human immunodeficiency virus).  ? AIDS (acquired immunodeficiency syndrome).  ? Parkinson disease.  ? Mood disorders, such as depression.   Are 40-60 years old.  What are the signs or symptoms?  Symptoms of this condition include:   Thick scales on the scalp.   Redness on the face or in the armpits.   Skin that is flaky. The flakes may be white or yellow.   Skin that seems oily or dry but is not helped with moisturizers.   Itching or burning in the affected areas.  How is this diagnosed?  This condition is diagnosed with a medical history and physical exam. A sample of your skin may be tested (skin biopsy). You may need to see a skin specialist (dermatologist).  How is this treated?  There is no cure for this condition, but treatment can help to manage the symptoms. You may get treatment to remove scales, lower the risk of skin infection, and reduce swelling or itching. Treatment may include:   Creams that reduce swelling and irritation (steroids).   Creams that reduce skin yeast.   Medicated shampoo, soaps, moisturizing creams, or ointments.   Medicated moisturizing creams or ointments.  Follow these instructions at home:   Apply over-the-counter and prescription  medicines only as told by your health care provider.   Use any medicated shampoo, soaps, skin creams, or ointments only as told by your health care provider.   Keep all follow-up visits as told by your health care provider. This is important.  Contact a health care provider if:   Your symptoms do not improve with treatment.   Your symptoms get worse.   You have new symptoms.  This information is not intended to replace advice given to you by your health care provider. Make sure you discuss any questions you have with your health care provider.  Document Released: 06/11/2005 Document Revised: 12/30/2015 Document Reviewed: 09/29/2015  Elsevier Interactive Patient Education  2019 [employer] Inc[employer]

## 2018-09-02 NOTE — Progress Notes (Signed)
Subjective:    Patient ID: Kellie [name], female    DOB: December 11, 1955, 63 y.o.   MRN: [id number]  HPI Pt is a 63 yo female who presents to the clinic for follow up.   .. Active Ambulatory Problems    Diagnosis Date Noted  . GERD (gastroesophageal reflux disease) 09/27/2010  . Allergic rhinitis 11/08/2010  . Insomnia 11/08/2010  . Osteoporosis 11/21/2011  . Borderline intellectual functioning 02/15/2012  . Genital herpes 11/05/2012  . Hemorrhoids 11/05/2012  . Chronic periodontal disease 06/30/2013  . Adhesive capsulitis of right shoulder 11/24/2013  . Encounter for gynecological examination with Papanicolaou smear of cervix 12/08/2013  . Prediabetes 12/14/2013  . Postmenopausal atrophic vaginitis 03/17/2014  . Bipolar disorder with depression ([place]) 06/02/2014  . Family history of renal cancer 10/11/2014  . DDD (degenerative disc disease), lumbar 10/11/2014  . Rhinitis, allergic 10/14/2014  . Near syncope 02/09/2015  . Chronic nasal congestion 03/01/2015  . Vitreous floaters of right eye 03/01/2015  . DDD (degenerative disc disease), cervical 07/05/2015  . Cough, persistent 07/21/2015  . Post-nasal drip 07/21/2015  . Left knee pain 11/09/2015  . Chronic pain of both knees 11/09/2015  . Plantar fasciitis of left foot 11/09/2015  . Hoarseness 03/09/2016  . Pulmonary nodule 08/08/2016  . Bipolar 1 disorder with moderate mania ([place]) 09/17/2016  . Panic attacks 09/18/2016  . SOB (shortness of breath) 09/18/2016  . Weakness 10/05/2016  . Family history of pancreatic cancer 02/18/2017  . Diarrhea 02/18/2017  . Loss of weight 02/18/2017  . Unintentional weight loss 05/18/2017  . Elevated LDL cholesterol level 05/18/2017  . Hyperlipidemia 05/18/2017  . Irritable bowel syndrome with both constipation and diarrhea 05/18/2017  . History of loop electrical excision procedure (LEEP) 07/01/2017  . Atypical chest pain 10/22/2017  . Anxiety 10/22/2017  . Aortic insufficiency  01/10/2018  . Non-restorative sleep 01/10/2018  . Snoring 01/10/2018  . Recent skin changes 03/13/2018  . Periodic limb movement 03/13/2018  . Nocturnal leg cramps 03/13/2018  . Vaginal dryness 09/02/2018   Resolved Ambulatory Problems    Diagnosis Date Noted  . Sinusitis 09/27/2010  . Depression 09/27/2010  . Bronchitis 12/04/2010  . Cough 06/02/2011  . General medical examination 10/18/2011  . Dizziness 11/21/2011  . Knee pain 11/21/2011  . Low back pain 11/21/2011  . Heel pain 07/14/2012  . Thoracic myofascial strain 03/13/2013  . Sinusitis 04/20/2013  . Cough 04/22/2013  . Rotator cuff disorder 08/01/2013  . Right shoulder pain 10/27/2013  . Adhesive capsulitis 11/02/2013  . Pain in joint, shoulder region 11/24/2013  . Bipolar disorder ([place]) 04/29/2014  . Right serous otitis media 08/03/2014  . Acute recurrent maxillary sinusitis 10/14/2014  . Right-sided low back pain without sciatica 10/14/2014  . Sinusitis 02/09/2015  . Sacroiliac joint dysfunction of right side 02/25/2015  . Piriformis syndrome 03/01/2015  . Adhesive capsulitis 07/05/2015  . Radiculitis of right cervical region 07/06/2015  . Drug reaction 09/25/2016  . Mitral valve prolapse 01/10/2018   Past Medical History:  Diagnosis Date  . Abnormal Pap smear of cervix   . Allergic rhinitis 11/08/2010  . Asthma   . Herpes simplex of female genitalia   . Urticaria    Pt comes in with list.   She wants referral for her frequent sinus infections and nasal congestion. She has seen ENT before in the past but wants second opinion. She always feels congested. She has flonase and uses it. She is on zyrtec daily.   She has some  fine scales over scalp. Itchy at times. Noticed for last few months.   She would like to restart estrogen cream for vaginal dryness.   She has ongoing chronic knee pain from arthritis. She needs handicap paperwork so she can avoid walking long distances.        Review of  Systems See HPI.     Objective:   Physical Exam Vitals signs reviewed.  Constitutional:      Appearance: Normal appearance.  HENT:     Head: Normocephalic and atraumatic.     Right Ear: Tympanic membrane normal.     Left Ear: Tympanic membrane normal.     Nose: Congestion present.     Mouth/Throat:     Mouth: Mucous membranes are moist.     Pharynx: Oropharynx is clear.  Cardiovascular:     Rate and Rhythm: Normal rate and regular rhythm.     Pulses: Normal pulses.  Pulmonary:     Effort: Pulmonary effort is normal.  Lymphadenopathy:     Cervical: No cervical adenopathy.  Neurological:     General: No focal deficit present.     Mental Status: She is alert and oriented to person, place, and time.           Assessment & Plan:  [name] KitchenMarland KitchenDiagnoses and all orders for this visit:  Recurrent sinusitis -     Ambulatory referral to ENT  Osteoporosis without current pathological fracture, unspecified osteoporosis type -     alendronate (FOSAMAX) 70 MG tablet; Take 1 tablet (70 mg total) by mouth every 7 (seven) days. Take with a full glass of water on an empty stomach.  Seborrheic dermatitis -     ketoconazole (NIZORAL) 2 % shampoo; Apply 1 application topically 2 (two) times a week.  Cough  Vaginal dryness -     conjugated estrogens (PREMARIN) vaginal cream; One application three times a week.  Chronic pain of both knees  Gastroesophageal reflux disease with esophagitis -     pantoprazole (PROTONIX) 40 MG tablet; TAKE (1) TABLET BY MOUTH ONCE DAILY.  SOB (shortness of breath) -     albuterol (PROVENTIL HFA;VENTOLIN HFA) 108 (90 Base) MCG/ACT inhaler; Inhale 2 puffs into the lungs every 6 (six) hours as needed for wheezing or shortness of breath.  Chronic nasal congestion -     Ambulatory referral to ENT   Referral made to ENT. Continue flonase and sinus rinses.   Restarted premarin for vaginal dryness.   Shampoo given for seb dermatitis.   Filled out handicap for  chronic knee pain.   Bone density up to date. Needed fosamax.   Pt declined tdap and shingles. Pt aware of risk.

## 2018-09-08 ENCOUNTER — Other Ambulatory Visit: Payer: Self-pay

## 2018-09-08 ENCOUNTER — Ambulatory Visit (INDEPENDENT_AMBULATORY_CARE_PROVIDER_SITE_OTHER): Payer: Medicare HMO | Admitting: Licensed Clinical Social Worker

## 2018-09-08 ENCOUNTER — Encounter (HOSPITAL_COMMUNITY): Payer: Self-pay | Admitting: Licensed Clinical Social Worker

## 2018-09-08 DIAGNOSIS — F310 Bipolar disorder, current episode hypomanic: Secondary | ICD-10-CM

## 2018-09-08 DIAGNOSIS — F3160 Bipolar disorder, current episode mixed, unspecified: Secondary | ICD-10-CM
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
## 2018-09-09 ENCOUNTER — Other Ambulatory Visit: Payer: Self-pay

## 2018-09-09 ENCOUNTER — Encounter (HOSPITAL_COMMUNITY): Payer: Self-pay | Admitting: Psychiatry

## 2018-09-09 ENCOUNTER — Ambulatory Visit (INDEPENDENT_AMBULATORY_CARE_PROVIDER_SITE_OTHER): Payer: Medicare HMO | Admitting: Psychiatry

## 2018-09-09 VITALS — BP 112/68 | HR 72 | Ht 66.0 in | Wt 114.0 lb

## 2018-09-09 DIAGNOSIS — R634 Abnormal weight loss: Secondary | ICD-10-CM

## 2018-09-09 DIAGNOSIS — F310 Bipolar disorder, current episode hypomanic: Secondary | ICD-10-CM

## 2018-09-09 DIAGNOSIS — F5101 Primary insomnia: Secondary | ICD-10-CM

## 2018-09-09 DIAGNOSIS — F5102 Adjustment insomnia: Secondary | ICD-10-CM

## 2018-09-09 DIAGNOSIS — F411 Generalized anxiety disorder: Secondary | ICD-10-CM | POA: Diagnosis not present

## 2018-09-09 MED ORDER — CITALOPRAM HYDROBROMIDE 10 MG PO TABS: ORAL_TABLET | ORAL | 4 refills | Status: DC

## 2018-09-09 MED ORDER — GABAPENTIN 100 MG PO CAPS: ORAL_CAPSULE | ORAL | 4 refills | Status: DC

## 2018-09-09 NOTE — Progress Notes (Signed)
Patient ID: Kellie [name], female   DOB: 1955/09/19, 63 y.o.   MRN: [id number]   [facility] Follow up Outpatient visit  Kellie [name] [id number] 63 y.o.  09/09/2018 10:28 AM  Chief Complaint:   Follow up for Bipolar  And anxiety.  History of Present Illness:   Doing fair. Tolerating meds. Gain some weight, going to gym, remeron has helped. Gabapentin helps mood, bipolar and some anxiety Takes xanax from primary care but keeping dose low for sleep   Anxiety more during evening Worries are less except the regular.     Past Psychiatric History/Hospitalization(s) denies  Hospitalization for psychiatric illness: No History of Electroconvulsive Shock Therapy: No Prior Suicide Attempts: No  Medical History; Past Medical History:  Diagnosis Date  . Abnormal Pap smear of cervix   . Allergic rhinitis 11/08/2010  . Anxiety   . Asthma   . Bronchitis   . Herpes simplex of female genitalia   . Hyperlipidemia   . Mitral valve prolapse   . Osteoporosis 11/21/2011  . Urticaria     Allergies: Allergies  Allergen Reactions  . Hydrocodone Nausea And Vomiting  . Abilify [Aripiprazole]     Nausea/vomiting/weakness/shaky  . Codeine Nausea And Vomiting  . Morphine And Related Nausea And Vomiting  . Percocet [Oxycodone-Acetaminophen] Nausea And Vomiting and Other (See Comments)    Patient states it makes blood pressure drop too much  . Vicodin [Hydrocodone-Acetaminophen] Nausea And Vomiting    Medications: Outpatient Encounter Medications as of 09/09/2018  Medication Sig  . acyclovir (ZOVIRAX) 200 MG capsule Take 2 capsules (400 mg total) by mouth 2 (two) times daily.  [name] Kitchen albuterol (PROVENTIL HFA;VENTOLIN HFA) 108 (90 Base) MCG/ACT inhaler Inhale 2 puffs into the lungs every 6 (six) hours as needed for wheezing or shortness of breath.  [name] Kitchen alendronate (FOSAMAX) 70 MG tablet Take 1 tablet (70 mg total) by mouth every 7 (seven) days. Take with a full glass of water on an  empty stomach.  . ALPRAZolam (XANAX) 0.5 MG tablet TAKE 1 TABLET ONCE DAILY FOR ANXIETY.  . cetirizine (ZYRTEC) 10 MG tablet Take 10 mg by mouth daily.  . Cholecalciferol (VITAMIN D) 2000 units CAPS Take by mouth.  . citalopram (CELEXA) 10 MG tablet Take one a day  . conjugated estrogens (PREMARIN) vaginal cream One application three times a week.  . fluticasone (FLONASE) 50 MCG/ACT nasal spray Place 2 sprays into the nose daily.  . fluticasone (FLONASE) 50 MCG/ACT nasal spray Place 2 sprays into both nostrils daily.  [name] Kitchen gabapentin (NEURONTIN) 100 MG capsule TAKE (1) CAPSULE BY MOUTH THREE TIMES DAILY.  . hydrocortisone-pramoxine (PROCTOFOAM-HC) rectal foam Place 1 applicator rectally 2 (two) times daily.  [name] Kitchen ketoconazole (NIZORAL) 2 % shampoo Apply 1 application topically 2 (two) times a week.  . Magnesium 250 MG TABS Take 1 tablet by mouth daily.  . meloxicam (MOBIC) 15 MG tablet One tab PO qAM with breakfast for 2 weeks, then daily prn pain.  . mirtazapine (REMERON) 15 MG tablet TAKE (1) TABLET BY MOUTH AT BEDTIME.  [name] Kitchen omeprazole (PRILOSEC) 20 MG capsule Take 20 mg by mouth daily.  . pantoprazole (PROTONIX) 40 MG tablet TAKE (1) TABLET BY MOUTH ONCE DAILY.  . Probiotic Product (PROBIOTIC & ACIDOPHILUS EX ST PO) Take by mouth.  . promethazine (PHENERGAN) 12.5 MG tablet Take 1 tablet (12.5 mg total) by mouth every 6 (six) hours as needed for nausea or vomiting.  . Pseudoephedrine-Guaifenesin (MUCINEX D MAX STRENGTH PO) Take by mouth  every 12 (twelve) hours as needed.  . [DISCONTINUED] citalopram (CELEXA) 10 MG tablet TAKE (1) TABLET BY MOUTH ONCE DAILY.  . [DISCONTINUED] gabapentin (NEURONTIN) 100 MG capsule TAKE (1) CAPSULE BY MOUTH THREE TIMES DAILY.   No facility-administered encounter medications on file as of 09/09/2018.      Family History; Family History  Problem Relation Age of Onset  . Kidney cancer Mother 30  . COPD Mother   . Cancer Mother        renal  . Kidney disease  Mother   . Allergic rhinitis Mother   . Asthma Mother   . Heart disease Father   . Allergic rhinitis Father   . COPD Sister   . Depression Sister   . Pancreatic cancer Sister   . Cancer Sister        pancreatic  . Heart disease Brother   . Heart attack Brother   . Heart disease Paternal Uncle   . Diabetes Other        neice  . Thyroid disease Other        neice  . Depression Maternal Aunt   . Depression Maternal Grandfather   . Hypothyroidism Sister   . Hypothyroidism Sister   . Colon cancer Neg Hx   . Rectal cancer Neg Hx   . Stomach cancer Neg Hx   . Colon polyps Neg Hx        Labs:  No results found for this or any previous visit (from the past 2160 hour(s)).   Mental Status Examination;   Psychiatric Specialty Exam: Physical Exam  Constitutional: She appears well-developed and well-nourished.  Skin: She is not diaphoretic.    Review of Systems  Constitutional: Negative for fever.  Cardiovascular: Negative for palpitations.  Skin: Negative for rash.  Neurological: Negative for tremors and headaches.  Psychiatric/Behavioral: Negative for depression and suicidal ideas.    Blood pressure 112/68, pulse 72, height 5\' 6"  (1.676 m), weight 114 lb (51.7 kg).Body mass index is 18.4 kg/m.  General Appearance: Casual  Eye Contact::  Fair  Speech:  coherent  Volume:  Normal  Mood: fair  Affect: reactive  Thought Process: clear . No psychosis  Orientation:  Full (Time, Place, and Person)  Thought Content:  Rumination  Suicidal Thoughts:  No  Homicidal Thoughts:  No  Memory:  Immediate;   Fair Recent;   Fair  Judgement:  Fair  Insight:  Shallow  Psychomotor Activity:  Increased  Concentration:  Fair  Recall:  Fair  Akathisia:  Negative  Handed:  Right  AIMS (if indicated):     Assets:  Desire for Improvement Physical Health Transportation  Sleep:        Assessment: Axis I: bipolar disorder II unspecified or depressed type. Anxiety disorder NOS.  Insomnia  Axis II: deferred  Axis III:  Past Medical History:  Diagnosis Date  . Abnormal Pap smear of cervix   . Allergic rhinitis 11/08/2010  . Anxiety   . Asthma   . Bronchitis   . Herpes simplex of female genitalia   . Hyperlipidemia   . Mitral valve prolapse   . Osteoporosis 11/21/2011  . Urticaria     Axis IV: psychosocial   Treatment Plan and Summary:  Bipolar :doing fair. Continue gabapentin GAD: fair. Continue celexa, gabapentin Insomnia: fair. Continue remeron.. Work on sleep hygiene Reviewed meds   Questions addressed FU 4-5 months.  Merian Capron, MD 09/09/2018

## 2018-09-10 ENCOUNTER — Telehealth: Payer: Medicare HMO | Admitting: Nurse Practitioner

## 2018-09-10 ENCOUNTER — Telehealth: Payer: Self-pay | Admitting: Physician Assistant

## 2018-09-10 DIAGNOSIS — R110 Nausea: Secondary | ICD-10-CM | POA: Diagnosis not present

## 2018-09-10 DIAGNOSIS — R1111 Vomiting without nausea: Secondary | ICD-10-CM | POA: Diagnosis not present

## 2018-09-10 DIAGNOSIS — R112 Nausea with vomiting, unspecified: Secondary | ICD-10-CM

## 2018-09-10 DIAGNOSIS — R509 Fever, unspecified: Secondary | ICD-10-CM | POA: Diagnosis not present

## 2018-09-10 DIAGNOSIS — R531 Weakness: Secondary | ICD-10-CM | POA: Diagnosis not present

## 2018-09-10 MED ORDER — ONDANSETRON HCL 4 MG PO TABS: 4.0000 mg | ORAL_TABLET | Freq: Three times a day (TID) | ORAL | 0 refills | Status: DC | PRN

## 2018-09-10 NOTE — Progress Notes (Signed)
We are sorry that you are not feeling well. Here is how we plan to help!  Based on what you have shared with me it looks like you have a Virus that is irritating your GI tract.  Vomiting is the forceful emptying of a portion of the stomach's content through the mouth.  Although nausea and vomiting can make you feel miserable, it's important to remember that these are not diseases, but rather symptoms of an underlying illness.  When we treat short term symptoms, we always caution that any symptoms that persist should be fully evaluated in a medical office.  I have prescribed a medication that will help alleviate your symptoms and allow you to stay hydrated:  Zofran 4 mg 1 tablet every 8 hours as needed for nausea and vomiting  HOME CARE:  Drink clear liquids.  This is very important! Dehydration (the lack of fluid) can lead to a serious complication.  Start off with 1 tablespoon every 5 minutes for 8 hours.  You may begin eating bland foods after 8 hours without vomiting.  Start with saltine crackers, white bread, rice, mashed potatoes, applesauce.  After 48 hours on a bland diet, you may resume a normal diet.  Try to go to sleep.  Sleep often empties the stomach and relieves the need to vomit.  GET HELP RIGHT AWAY IF:   Your symptoms do not improve or worsen within 2 days after treatment.  You have a fever for over 3 days.  You cannot keep down fluids after trying the medication.  MAKE SURE YOU:   Understand these instructions.  Will watch your condition.  Will get help right away if you are not doing well or get worse.   Thank you for choosing an e-visit. Your e-visit answers were reviewed by a board certified advanced clinical practitioner to complete your personal care plan. Depending upon the condition, your plan could have included both over the counter or prescription medications. Please review your pharmacy choice. Be sure that the pharmacy you have chosen is open so  that you can pick up your prescription now.  If there is a problem you may message your provider in [place] to have the prescription routed to another pharmacy. Your safety is important to [place]. If you have drug allergies check your prescription carefully.  For the next 24 hours, you can use MyChart to ask questions about today's visit, request a non-urgent call back, or ask for a work or school excuse from your e-visit provider. You will get an e-mail in the next two days asking about your experience. I hope that your e-visit has been valuable and will speed your recovery.  5 minutes spent reviewing and documenting in chart.

## 2018-09-10 NOTE — Telephone Encounter (Signed)
Patient sister called and stated that she was throwing up and having chills. She has not been able to keep anything down but water. She was offered to go to the ER or Urgent Care and declined. She was offered an appointment with another provider tomorrow and declined. She reports she was very weak and was not able to go out due to her weakness. She is going to do an E-visit and she will call the office if her symptoms get worse. FYI.

## 2018-09-11 NOTE — Telephone Encounter (Signed)
Patient had some nausea medication but she could not remember what it was and she took some and felt better.

## 2018-09-11 NOTE — Telephone Encounter (Signed)
Does she have any anti nausea medications at home? If not we can send her zofran. Vomiting reassuring not covid which she might be concerned about. Did she eat anything undercook or new? Keep [place] updated.

## 2018-09-14 ENCOUNTER — Telehealth: Payer: Medicare HMO | Admitting: Family

## 2018-09-14 DIAGNOSIS — R109 Unspecified abdominal pain: Secondary | ICD-10-CM

## 2018-09-14 NOTE — Progress Notes (Signed)
Based on what you shared with me, I feel your condition warrants further evaluation and I recommend that you be seen for a face to face office visit.  Given your symptoms of abdominal pain and it looks like you completed an Evisit a few days ago about nausea and vomiting, you need to be seen face to face to be evaluated.    NOTE: If you entered your credit card information for this eVisit, you will not be charged. You may see a "hold" on your card for the $35 but that hold will drop off and you will not have a charge processed.  If you are having a true medical emergency please call 911.  If you need an urgent face to face visit, [place] has four urgent care centers for your convenience.    PLEASE NOTE: THE [facility] LOCATIONS AND URGENT CARE CLINICS DO NOT HAVE THE TESTING FOR CORONAVIRUS COVID19 AVAILABLE.  IF YOU FEEL YOU NEED THIS TEST YOU MUST HAVE AN ORDER TO GO TO A TESTING LOCATION FROM YOUR PROVIDER OR FROM A SCREENING E-VISIT     [email or web address] to reserve your spot online an avoid wait times  [facility] [address], [address] [place], [place] [address] 8 am to 8 pm Monday-Friday 10 am to 4 pm Saturday-Sunday *Across the street from [employer]  [place], [address] 8 am to 5 pm Monday-Friday * In the [facility] on the [facility]   The following sites will take your insurance:  . [facility] [place] a Provider at this Location  [address] [place], [place] [address] . 10 am to 8 pm Monday-Friday . 12 pm to 8 pm Saturday-Sunday   . [facility] at [place] a Provider at this Location  [address], [place], [place] [address] . 8 am to 8 pm Monday-Friday . 9 am to 6 pm Saturday . 11 am to 6 pm Sunday   . [facility] at [place] Get Driving Directions  [address] [place], [place] [address] . 8 am to 8 pm Monday-Friday . 8 am to 4 pm Saturday-Sunday   Your e-visit answers were reviewed by a board certified advanced clinical practitioner to complete your personal care plan.  Thank you for using e-Visits.

## 2018-09-15 ENCOUNTER — Telehealth (INDEPENDENT_AMBULATORY_CARE_PROVIDER_SITE_OTHER): Payer: Medicare HMO | Admitting: Physician Assistant

## 2018-09-15 ENCOUNTER — Telehealth: Payer: Self-pay

## 2018-09-15 ENCOUNTER — Other Ambulatory Visit: Payer: Self-pay

## 2018-09-15 VITALS — BP 107/56 | HR 78 | Temp 97.1°F

## 2018-09-15 DIAGNOSIS — Z8719 Personal history of other diseases of the digestive system: Secondary | ICD-10-CM

## 2018-09-15 DIAGNOSIS — R1032 Left lower quadrant pain: Secondary | ICD-10-CM | POA: Diagnosis not present

## 2018-09-15 DIAGNOSIS — R112 Nausea with vomiting, unspecified: Secondary | ICD-10-CM | POA: Diagnosis not present

## 2018-09-15 MED ORDER — AMOXICILLIN-POT CLAVULANATE 875-125 MG PO TABS: 1.0000 | ORAL_TABLET | Freq: Two times a day (BID) | ORAL | 0 refills | Status: DC

## 2018-09-15 NOTE — Telephone Encounter (Signed)
Tell her I will call her (virtual visit) with me today 4:30. So I can ask some questions. Make sure she has tempature/BP/HR.

## 2018-09-15 NOTE — Telephone Encounter (Signed)
Scheduled

## 2018-09-15 NOTE — Patient Instructions (Signed)
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
## 2018-09-15 NOTE — Telephone Encounter (Signed)
Pt left a message very irritated that when she tried to do an e-visit for diverticulitis, she was advised to be seen face-to-face. Pt states that she is NOT going to come into office and she REFUSES to be seen.   States she is constipated and in pain due to having a diverticulitis flare after eating some peanuts. Pt states she is drinking plenty of water and denied any nausea, vomiting, or diarrhea. Pt reports being in "massive pain" and requests that [facility] just send over RX for Cipro & Flagyl.   Jade, please advise

## 2018-09-16 DIAGNOSIS — Z8719 Personal history of other diseases of the digestive system: Secondary | ICD-10-CM | POA: Insufficient documentation

## 2018-09-22 ENCOUNTER — Other Ambulatory Visit: Payer: Self-pay | Admitting: Physician Assistant

## 2018-09-22 DIAGNOSIS — F419 Anxiety disorder, unspecified: Secondary | ICD-10-CM

## 2018-09-22 DIAGNOSIS — F410 Panic disorder [episodic paroxysmal anxiety] without agoraphobia: Secondary | ICD-10-CM

## 2018-10-13 ENCOUNTER — Encounter (HOSPITAL_COMMUNITY): Payer: Self-pay | Admitting: Licensed Clinical Social Worker

## 2018-10-13 ENCOUNTER — Other Ambulatory Visit: Payer: Self-pay

## 2018-10-13 ENCOUNTER — Ambulatory Visit (INDEPENDENT_AMBULATORY_CARE_PROVIDER_SITE_OTHER): Payer: Medicare HMO | Admitting: Licensed Clinical Social Worker

## 2018-10-13 DIAGNOSIS — F310 Bipolar disorder, current episode hypomanic: Secondary | ICD-10-CM | POA: Diagnosis not present
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
## 2018-10-23 ENCOUNTER — Other Ambulatory Visit: Payer: Self-pay | Admitting: Physician Assistant

## 2018-10-23 DIAGNOSIS — M5030 Other cervical disc degeneration, unspecified cervical region: Secondary | ICD-10-CM

## 2018-10-23 DIAGNOSIS — F419 Anxiety disorder, unspecified: Secondary | ICD-10-CM

## 2018-10-23 DIAGNOSIS — F410 Panic disorder [episodic paroxysmal anxiety] without agoraphobia: Secondary | ICD-10-CM

## 2018-10-23 NOTE — Telephone Encounter (Signed)
Requesting refills on Xanax and Meloxicam  Last OV 09/15/18  Last meloxicam RX sent 03/10/18 for #30 with 5 RF  Last Xanax RF sent 09/22/18 for #30 no RF  Both RX pended, please review

## 2018-12-01 ENCOUNTER — Other Ambulatory Visit: Payer: Self-pay | Admitting: Physician Assistant

## 2018-12-01 DIAGNOSIS — A6004 Herpesviral vulvovaginitis: Secondary | ICD-10-CM

## 2018-12-18 ENCOUNTER — Ambulatory Visit (INDEPENDENT_AMBULATORY_CARE_PROVIDER_SITE_OTHER): Payer: Medicare HMO | Admitting: Otolaryngology

## 2018-12-18 DIAGNOSIS — R0982 Postnasal drip: Secondary | ICD-10-CM

## 2018-12-18 DIAGNOSIS — J343 Hypertrophy of nasal turbinates: Secondary | ICD-10-CM

## 2018-12-18 DIAGNOSIS — H608X3 Other otitis externa, bilateral: Secondary | ICD-10-CM

## 2018-12-18 DIAGNOSIS — R490 Dysphonia: Secondary | ICD-10-CM | POA: Diagnosis not present

## 2018-12-22 DIAGNOSIS — H02834 Dermatochalasis of left upper eyelid: Secondary | ICD-10-CM | POA: Diagnosis not present

## 2018-12-22 DIAGNOSIS — H5203 Hypermetropia, bilateral: Secondary | ICD-10-CM | POA: Diagnosis not present

## 2018-12-22 DIAGNOSIS — H11153 Pinguecula, bilateral: Secondary | ICD-10-CM | POA: Diagnosis not present

## 2018-12-22 DIAGNOSIS — H25013 Cortical age-related cataract, bilateral: Secondary | ICD-10-CM | POA: Diagnosis not present

## 2018-12-22 DIAGNOSIS — H524 Presbyopia: Secondary | ICD-10-CM | POA: Diagnosis not present

## 2018-12-22 DIAGNOSIS — H2513 Age-related nuclear cataract, bilateral: Secondary | ICD-10-CM | POA: Diagnosis not present

## 2018-12-22 DIAGNOSIS — H52203 Unspecified astigmatism, bilateral: Secondary | ICD-10-CM | POA: Diagnosis not present

## 2018-12-22 DIAGNOSIS — H02831 Dermatochalasis of right upper eyelid: Secondary | ICD-10-CM | POA: Diagnosis not present

## 2019-01-05 ENCOUNTER — Telehealth: Payer: Self-pay | Admitting: Physician Assistant

## 2019-01-05 ENCOUNTER — Encounter: Payer: Self-pay | Admitting: Neurology

## 2019-01-05 NOTE — Telephone Encounter (Signed)
I actually have not done any telephone physicals.  I have had a couple people ask but I told them that had they had to call their insurance to find out it is covered and if it is then I would just focus on your prevention and leave the exam blank.

## 2019-01-05 NOTE — Telephone Encounter (Signed)
Are you coding for telephone physicals even if not doing a physical exam just under preventive care?

## 2019-01-05 NOTE — Telephone Encounter (Signed)
This encounter was created in error - please disregard.

## 2019-01-05 NOTE — Telephone Encounter (Signed)
See Dr. Wylene Men. Pt needs to check with insurance. If they pay we can.

## 2019-01-05 NOTE — Telephone Encounter (Signed)
Patient made aware. She wants to cancel for now until she can call and check on what her insurance covers.

## 2019-01-05 NOTE — Telephone Encounter (Signed)
Patient wanted to know if she could do a telephone physical. Please advise.

## 2019-01-06 ENCOUNTER — Encounter: Payer: Medicare HMO | Admitting: Physician Assistant

## 2019-01-14 ENCOUNTER — Ambulatory Visit (INDEPENDENT_AMBULATORY_CARE_PROVIDER_SITE_OTHER): Payer: Medicare HMO | Admitting: Physician Assistant

## 2019-01-14 ENCOUNTER — Encounter: Payer: Self-pay | Admitting: Physician Assistant

## 2019-01-14 VITALS — Ht 66.0 in | Wt 115.0 lb

## 2019-01-14 DIAGNOSIS — M722 Plantar fascial fibromatosis: Secondary | ICD-10-CM | POA: Diagnosis not present

## 2019-01-14 DIAGNOSIS — M25572 Pain in left ankle and joints of left foot: Secondary | ICD-10-CM | POA: Diagnosis not present

## 2019-01-14 DIAGNOSIS — M25571 Pain in right ankle and joints of right foot: Secondary | ICD-10-CM

## 2019-01-14 DIAGNOSIS — G8929 Other chronic pain: Secondary | ICD-10-CM | POA: Diagnosis not present

## 2019-01-14 MED ORDER — DICLOFENAC SODIUM 1 % TD GEL: 4.0000 g | Freq: Four times a day (QID) | TRANSDERMAL | 1 refills | Status: DC

## 2019-01-14 NOTE — Progress Notes (Signed)
Right ankle, heels both feet. Pain. No sores/injuries/abrasions.

## 2019-01-14 NOTE — Progress Notes (Signed)
Patient ID: Kellie [name], female   DOB: [birth year], 63 y.o.   MRN: [id number] .[name] KitchenVirtual Visit via Telephone Note  I connected with Kellie [name] on 01/14/19 at 11:10 AM EDT by telephone and verified that I am speaking with the correct person using two identifiers.  Location: Patient: home Provider: home   I discussed the limitations, risks, security and privacy concerns of performing an evaluation and management service by telephone and the availability of in person appointments. I also discussed with the patient that there may be a patient responsible charge related to this service. The patient expressed understanding and agreed to proceed.   History of Present Illness: Pt is a 63 yo female with hx of bilateral heel pain who calls in due to now bilateral ankle pain. She has used ice, NSaIDS, exercise with plantar fasciitis and symptoms come and go. The ankle pain is now really bothering her. Right seems to be worse than left. She would like referral.    .. Active Ambulatory Problems    Diagnosis Date Noted  . GERD (gastroesophageal reflux disease) 09/27/2010  . Allergic rhinitis 11/08/2010  . Insomnia 11/08/2010  . Osteoporosis 11/21/2011  . Borderline intellectual functioning 02/15/2012  . Genital herpes 11/05/2012  . Hemorrhoids 11/05/2012  . Chronic periodontal disease 06/30/2013  . Adhesive capsulitis of right shoulder 11/24/2013  . Encounter for gynecological examination with Papanicolaou smear of cervix 12/08/2013  . Prediabetes 12/14/2013  . Postmenopausal atrophic vaginitis 03/17/2014  . Bipolar disorder with depression ([place]) 06/02/2014  . Family history of renal cancer 10/11/2014  . DDD (degenerative disc disease), lumbar 10/11/2014  . Rhinitis, allergic 10/14/2014  . Near syncope 02/09/2015  . Chronic nasal congestion 03/01/2015  . Vitreous floaters of right eye 03/01/2015  . DDD (degenerative disc disease), cervical 07/05/2015  . Cough, persistent 07/21/2015  .  Post-nasal drip 07/21/2015  . Left knee pain 11/09/2015  . Chronic pain of both knees 11/09/2015  . Plantar fasciitis, bilateral 11/09/2015  . Hoarseness 03/09/2016  . Pulmonary nodule 08/08/2016  . Bipolar 1 disorder with moderate mania ([place]) 09/17/2016  . Panic attacks 09/18/2016  . SOB (shortness of breath) 09/18/2016  . Weakness 10/05/2016  . Family history of pancreatic cancer 02/18/2017  . Diarrhea 02/18/2017  . Loss of weight 02/18/2017  . Unintentional weight loss 05/18/2017  . Elevated LDL cholesterol level 05/18/2017  . Hyperlipidemia 05/18/2017  . Irritable bowel syndrome with both constipation and diarrhea 05/18/2017  . History of loop electrical excision procedure (LEEP) 07/01/2017  . Atypical chest pain 10/22/2017  . Anxiety 10/22/2017  . Aortic insufficiency 01/10/2018  . Non-restorative sleep 01/10/2018  . Snoring 01/10/2018  . Recent skin changes 03/13/2018  . Periodic limb movement 03/13/2018  . Nocturnal leg cramps 03/13/2018  . Vaginal dryness 09/02/2018  . History of diverticulitis 09/16/2018  . Chronic pain of both ankles 01/15/2019   Resolved Ambulatory Problems    Diagnosis Date Noted  . Sinusitis 09/27/2010  . Depression 09/27/2010  . Bronchitis 12/04/2010  . Cough 06/02/2011  . General medical examination 10/18/2011  . Dizziness 11/21/2011  . Knee pain 11/21/2011  . Low back pain 11/21/2011  . Heel pain 07/14/2012  . Thoracic myofascial strain 03/13/2013  . Sinusitis 04/20/2013  . Cough 04/22/2013  . Rotator cuff disorder 08/01/2013  . Right shoulder pain 10/27/2013  . Adhesive capsulitis 11/02/2013  . Pain in joint, shoulder region 11/24/2013  . Bipolar disorder ([place]) 04/29/2014  . Right serous otitis media 08/03/2014  . Acute recurrent  maxillary sinusitis 10/14/2014  . Right-sided low back pain without sciatica 10/14/2014  . Sinusitis 02/09/2015  . Sacroiliac joint dysfunction of right side 02/25/2015  . Piriformis syndrome 03/01/2015   . Adhesive capsulitis 07/05/2015  . Radiculitis of right cervical region 07/06/2015  . Drug reaction 09/25/2016  . Mitral valve prolapse 01/10/2018   Past Medical History:  Diagnosis Date  . Abnormal Pap smear of cervix   . Allergic rhinitis 11/08/2010  . Asthma   . Herpes simplex of female genitalia   . Urticaria    Reviewed med, allergy, problem list.   Observations/Objective: No acute distress.  Normal breathing.  Normal mood.   . Today's Vitals   01/14/19 1023  Weight: 115 lb (52.2 kg)  Height: 5\' 6"  (1.676 m)   Body mass index is 18.56 kg/m.   Assessment and Plan: [name] KitchenMarland KitchenNikeya was seen today for foot problem.  Diagnoses and all orders for this visit:  Plantar fasciitis, bilateral -     Ambulatory referral to Podiatry  Chronic pain of both ankles -     diclofenac sodium (VOLTAREN) 1 % GEL; Apply 4 g topically 4 (four) times daily. To affected joint. -     Ambulatory referral to Podiatry   Pt has hx of plantar fasciitis treated with conservative care for a while but never had injections. Now her ankles are starting to bother her. voltaren gel given to use. Encouraged good supportive shoes. Will make referral to podiatrist.   Follow Up Instructions:    I discussed the assessment and treatment plan with the patient. The patient was provided an opportunity to ask questions and all were answered. The patient agreed with the plan and demonstrated an understanding of the instructions.   The patient was advised to call back or seek an in-person evaluation if the symptoms worsen or if the condition fails to improve as anticipated.  I provided 20 minutes of non-face-to-face time during this encounter.   [name], PA-C

## 2019-01-15 ENCOUNTER — Ambulatory Visit (HOSPITAL_COMMUNITY): Payer: Medicare HMO | Admitting: Licensed Clinical Social Worker

## 2019-01-15 ENCOUNTER — Encounter: Payer: Medicare HMO | Admitting: Physician Assistant

## 2019-01-15 ENCOUNTER — Encounter: Payer: Self-pay | Admitting: Physician Assistant

## 2019-01-15 DIAGNOSIS — M25571 Pain in right ankle and joints of right foot: Secondary | ICD-10-CM | POA: Insufficient documentation

## 2019-01-15 DIAGNOSIS — G8929 Other chronic pain: Secondary | ICD-10-CM | POA: Insufficient documentation

## 2019-01-19 ENCOUNTER — Ambulatory Visit (INDEPENDENT_AMBULATORY_CARE_PROVIDER_SITE_OTHER): Payer: Medicare HMO | Admitting: Licensed Clinical Social Worker

## 2019-01-19 ENCOUNTER — Encounter (HOSPITAL_COMMUNITY): Payer: Self-pay | Admitting: Licensed Clinical Social Worker

## 2019-01-19 ENCOUNTER — Other Ambulatory Visit: Payer: Self-pay

## 2019-01-19 DIAGNOSIS — F310 Bipolar disorder, current episode hypomanic: Secondary | ICD-10-CM | POA: Diagnosis not present
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
## 2019-01-24 ENCOUNTER — Other Ambulatory Visit: Payer: Self-pay | Admitting: Physician Assistant

## 2019-01-24 ENCOUNTER — Other Ambulatory Visit (HOSPITAL_COMMUNITY): Payer: Self-pay | Admitting: Psychiatry

## 2019-01-24 DIAGNOSIS — A6004 Herpesviral vulvovaginitis: Secondary | ICD-10-CM

## 2019-01-28 DIAGNOSIS — H524 Presbyopia: Secondary | ICD-10-CM | POA: Diagnosis not present

## 2019-01-28 DIAGNOSIS — H52223 Regular astigmatism, bilateral: Secondary | ICD-10-CM | POA: Diagnosis not present

## 2019-02-13 ENCOUNTER — Other Ambulatory Visit: Payer: Self-pay

## 2019-02-13 ENCOUNTER — Ambulatory Visit (INDEPENDENT_AMBULATORY_CARE_PROVIDER_SITE_OTHER): Payer: Medicare HMO | Admitting: Physician Assistant

## 2019-02-13 VITALS — BP 106/66 | HR 68 | Temp 98.0°F | Ht 66.0 in | Wt 112.0 lb

## 2019-02-13 DIAGNOSIS — E538 Deficiency of other specified B group vitamins: Secondary | ICD-10-CM | POA: Diagnosis not present

## 2019-02-13 DIAGNOSIS — Z13820 Encounter for screening for osteoporosis: Secondary | ICD-10-CM

## 2019-02-13 DIAGNOSIS — A6004 Herpesviral vulvovaginitis: Secondary | ICD-10-CM | POA: Diagnosis not present

## 2019-02-13 DIAGNOSIS — Z0000 Encounter for general adult medical examination without abnormal findings: Secondary | ICD-10-CM | POA: Diagnosis not present

## 2019-02-13 DIAGNOSIS — R7303 Prediabetes: Secondary | ICD-10-CM | POA: Diagnosis not present

## 2019-02-13 DIAGNOSIS — E559 Vitamin D deficiency, unspecified: Secondary | ICD-10-CM

## 2019-02-13 DIAGNOSIS — N882 Stricture and stenosis of cervix uteri: Secondary | ICD-10-CM

## 2019-02-13 DIAGNOSIS — M810 Age-related osteoporosis without current pathological fracture: Secondary | ICD-10-CM | POA: Diagnosis not present

## 2019-02-13 DIAGNOSIS — E782 Mixed hyperlipidemia: Secondary | ICD-10-CM

## 2019-02-13 DIAGNOSIS — R634 Abnormal weight loss: Secondary | ICD-10-CM | POA: Diagnosis not present

## 2019-02-13 MED ORDER — ACYCLOVIR 200 MG PO CAPS: 400.0000 mg | ORAL_CAPSULE | Freq: Two times a day (BID) | ORAL | 5 refills | Status: DC
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
## 2019-02-13 NOTE — Patient Instructions (Signed)

## 2019-02-14 LAB — COMPLETE METABOLIC PANEL WITH GFR
AG Ratio: 2.2 (calc) (ref 1.0–2.5)
ALT: 13 U/L (ref 6–29)
AST: 19 U/L (ref 10–35)
Albumin: 4.8 g/dL (ref 3.6–5.1)
Alkaline phosphatase (APISO): 41 U/L (ref 37–153)
BUN: 16 mg/dL (ref 7–25)
CO2: 30 mmol/L (ref 20–32)
Calcium: 10.2 mg/dL (ref 8.6–10.4)
Chloride: 101 mmol/L (ref 98–110)
Creat: 0.77 mg/dL (ref 0.50–0.99)
GFR, Est African American: 95 mL/min/{1.73_m2} (ref >=60)
GFR, Est Non African American: 82 mL/min/{1.73_m2} (ref >=60)
Globulin: 2.2 g/dL (calc) (ref 1.9–3.7)
Glucose, Bld: 86 mg/dL (ref 65–99)
Potassium: 4.7 mmol/L (ref 3.5–5.3)
Sodium: 140 mmol/L (ref 135–146)
Total Bilirubin: 0.5 mg/dL (ref 0.2–1.2)
Total Protein: 7 g/dL (ref 6.1–8.1)

## 2019-02-14 LAB — CBC WITH DIFFERENTIAL/PLATELET
Absolute Monocytes: 304 cells/uL (ref 200–950)
Basophils Absolute: 52 cells/uL (ref 0–200)
Basophils Relative: 1.3 %
Eosinophils Absolute: 72 cells/uL (ref 15–500)
Eosinophils Relative: 1.8 %
HCT: 40.5 % (ref 35.0–45.0)
Hemoglobin: 13.6 g/dL (ref 11.7–15.5)
Lymphs Abs: 1228 cells/uL (ref 850–3900)
MCH: 30.8 pg (ref 27.0–33.0)
MCHC: 33.6 g/dL (ref 32.0–36.0)
MCV: 91.8 fL (ref 80.0–100.0)
MPV: 11.1 fL (ref 7.5–12.5)
Monocytes Relative: 7.6 %
Neutro Abs: 2344 cells/uL (ref 1500–7800)
Neutrophils Relative %: 58.6 %
Platelets: 363 10*3/uL (ref 140–400)
RBC: 4.41 10*6/uL (ref 3.80–5.10)
RDW: 12.2 % (ref 11.0–15.0)
Total Lymphocyte: 30.7 %
WBC: 4 10*3/uL (ref 3.8–10.8)

## 2019-02-14 LAB — LIPID PANEL W/REFLEX DIRECT LDL
Cholesterol: 236 mg/dL — ABNORMAL HIGH (ref <200)
HDL: 90 mg/dL (ref >=50)
LDL Cholesterol (Calc): 131 mg/dL (calc) — ABNORMAL HIGH
Non-HDL Cholesterol (Calc): 146 mg/dL (calc) — ABNORMAL HIGH (ref <130)
Total CHOL/HDL Ratio: 2.6 (calc) (ref <5.0)
Triglycerides: 54 mg/dL (ref <150)

## 2019-02-14 LAB — HEMOGLOBIN A1C
Hgb A1c MFr Bld: 5.5 % of total Hgb (ref <5.7)
Mean Plasma Glucose: 111 (calc)
eAG (mmol/L): 6.2 (calc)

## 2019-02-14 LAB — TSH: TSH: 1.88 mIU/L (ref 0.40–4.50)

## 2019-02-14 LAB — VITAMIN B12: Vitamin B-12: 673 pg/mL (ref 200–1100)

## 2019-02-14 LAB — VITAMIN D 25 HYDROXY (VIT D DEFICIENCY, FRACTURES): Vit D, 25-Hydroxy: 46 ng/mL (ref 30–100)

## 2019-02-16 DIAGNOSIS — E559 Vitamin D deficiency, unspecified: Secondary | ICD-10-CM | POA: Insufficient documentation

## 2019-02-16 NOTE — Progress Notes (Signed)
A!C is 5.5 and better than 1 year ago.  b12 is perfect.  Vitamin D is great.  Kidney, liver, glucose looks great.  Thyroid looks great.  Pt is not anemic.  TG great.  HDL wonderful.  LDL is up some from a year ago. 10 year CV risk is still low at 2.6 percent.   Overall labs look great.

## 2019-02-19 ENCOUNTER — Other Ambulatory Visit: Payer: Self-pay | Admitting: Physician Assistant

## 2019-02-19 DIAGNOSIS — Z1231 Encounter for screening mammogram for malignant neoplasm of breast: Secondary | ICD-10-CM

## 2019-02-23 ENCOUNTER — Other Ambulatory Visit: Payer: Medicare HMO

## 2019-02-25 ENCOUNTER — Other Ambulatory Visit: Payer: Medicare HMO

## 2019-02-25 ENCOUNTER — Ambulatory Visit (INDEPENDENT_AMBULATORY_CARE_PROVIDER_SITE_OTHER): Payer: Medicare HMO

## 2019-02-25 ENCOUNTER — Other Ambulatory Visit: Payer: Self-pay

## 2019-02-25 DIAGNOSIS — Z13820 Encounter for screening for osteoporosis: Secondary | ICD-10-CM | POA: Diagnosis not present

## 2019-02-25 DIAGNOSIS — Z780 Asymptomatic menopausal state: Secondary | ICD-10-CM | POA: Diagnosis not present

## 2019-02-25 DIAGNOSIS — M810 Age-related osteoporosis without current pathological fracture: Secondary | ICD-10-CM | POA: Diagnosis not present

## 2019-03-03 ENCOUNTER — Other Ambulatory Visit: Payer: Medicare HMO

## 2019-03-03 NOTE — Progress Notes (Signed)
Kellie [name],   Bone density continues to worsen. -2.9 and now -3.4 despite fosamax. Would you consider trying 6 month injection prolia. Some data suggest more benefit in bone density. Please continue vitamin D and calcium daily as well though. Also any low impact/low weight resistance can help as well.

## 2019-03-04 ENCOUNTER — Other Ambulatory Visit: Payer: Self-pay | Admitting: Neurology

## 2019-03-04 DIAGNOSIS — M810 Age-related osteoporosis without current pathological fracture: Secondary | ICD-10-CM

## 2019-03-05 ENCOUNTER — Ambulatory Visit (INDEPENDENT_AMBULATORY_CARE_PROVIDER_SITE_OTHER): Payer: Medicare HMO

## 2019-03-05 ENCOUNTER — Other Ambulatory Visit: Payer: Self-pay

## 2019-03-05 DIAGNOSIS — N882 Stricture and stenosis of cervix uteri: Secondary | ICD-10-CM | POA: Diagnosis not present

## 2019-03-05 DIAGNOSIS — D259 Leiomyoma of uterus, unspecified: Secondary | ICD-10-CM | POA: Diagnosis not present

## 2019-03-10 NOTE — Progress Notes (Signed)
Kellie [name],   On ultrasound they were not able to see ovaries. This is normal and what happens with age.   Small fibroid in the posterior wall of uterus. Does not need any intervention unless bothersome to you.   Some fluid noted in the endocervical canal but did not appear concerning.

## 2019-03-13 ENCOUNTER — Ambulatory Visit (INDEPENDENT_AMBULATORY_CARE_PROVIDER_SITE_OTHER): Payer: Medicare HMO | Admitting: Psychiatry

## 2019-03-13 ENCOUNTER — Other Ambulatory Visit: Payer: Self-pay

## 2019-03-13 ENCOUNTER — Encounter (HOSPITAL_COMMUNITY): Payer: Self-pay | Admitting: Psychiatry

## 2019-03-13 ENCOUNTER — Encounter

## 2019-03-13 DIAGNOSIS — F411 Generalized anxiety disorder: Secondary | ICD-10-CM

## 2019-03-13 DIAGNOSIS — F5102 Adjustment insomnia: Secondary | ICD-10-CM

## 2019-03-13 DIAGNOSIS — F310 Bipolar disorder, current episode hypomanic: Secondary | ICD-10-CM | POA: Diagnosis not present

## 2019-03-13 MED ORDER — GABAPENTIN 100 MG PO CAPS: ORAL_CAPSULE | ORAL | 2 refills | Status: DC

## 2019-03-13 MED ORDER — CITALOPRAM HYDROBROMIDE 10 MG PO TABS: ORAL_TABLET | ORAL | 2 refills | Status: DC

## 2019-03-13 NOTE — Progress Notes (Signed)
Patient ID: Kellie [name], female   DOB: 16-Nov-1955, 63 y.o.   MRN: [id number]   [facility] Follow up Outpatient visit  Kellie [name] [id number] 63 y.o.  03/13/2019 10:24 AM  Chief Complaint:   Follow up for Bipolar  And anxiety.  History of Present Illness:   I connected with [name] on 03/13/19 at 10:15 AM EDT by telephone and verified that I am speaking with the correct person using two identifiers.   I discussed the limitations, risks, security and privacy concerns of performing an evaluation and management service by telephone and the availability of in person appointments. I also discussed with the patient that there may be a patient responsible charge related to this service. The patient expressed understanding and agreed to proceed.  Doing fair, takes gaba and celexa. Trying to work on weight gain.  Back condition and low bone density concerns her.  Takes xanax from primary care but keeping dose low for sleep Mood swings less prominent  Anxiety more during evening Worries are less except the regular.  Past Psychiatric History/Hospitalization(s) denies  Hospitalization for psychiatric illness: No History of Electroconvulsive Shock Therapy: No Prior Suicide Attempts: No  Medical History; Past Medical History:  Diagnosis Date  . Abnormal Pap smear of cervix   . Allergic rhinitis 11/08/2010  . Anxiety   . Asthma   . Bronchitis   . Herpes simplex of female genitalia   . Hyperlipidemia   . Mitral valve prolapse   . Osteoporosis 11/21/2011  . Urticaria     Allergies: Allergies  Allergen Reactions  . Hydrocodone Nausea And Vomiting  . Abilify [Aripiprazole]     Nausea/vomiting/weakness/shaky  . Codeine Nausea And Vomiting  . Morphine And Related Nausea And Vomiting  . Percocet [Oxycodone-Acetaminophen] Nausea And Vomiting and Other (See Comments)    Patient states it makes blood pressure drop too much  . Vicodin [Hydrocodone-Acetaminophen]  Nausea And Vomiting    Medications: Outpatient Encounter Medications as of 03/13/2019  Medication Sig  . acyclovir (ZOVIRAX) 200 MG capsule Take 2 capsules (400 mg total) by mouth 2 (two) times daily.  [name] Kitchen albuterol (PROVENTIL HFA;VENTOLIN HFA) 108 (90 Base) MCG/ACT inhaler Inhale 2 puffs into the lungs every 6 (six) hours as needed for wheezing or shortness of breath.  [name] Kitchen alendronate (FOSAMAX) 70 MG tablet Take 1 tablet (70 mg total) by mouth every 7 (seven) days. Take with a full glass of water on an empty stomach.  . ALPRAZolam (XANAX) 0.5 MG tablet TAKE 1 TABLET ONCE DAILY FOR ANXIETY.  . Cholecalciferol (VITAMIN D) 2000 units CAPS Take by mouth.  . citalopram (CELEXA) 10 MG tablet TAKE (1) TABLET BY MOUTH ONCE DAILY.  [name] Kitchen conjugated estrogens (PREMARIN) vaginal cream One application three times a week.  . diclofenac sodium (VOLTAREN) 1 % GEL Apply 4 g topically 4 (four) times daily. To affected joint.  . fluticasone (FLONASE) 50 MCG/ACT nasal spray Place 2 sprays into the nose daily.  [name] Kitchen gabapentin (NEURONTIN) 100 MG capsule tid  . hydrocortisone-pramoxine (PROCTOFOAM-HC) rectal foam Place 1 applicator rectally 2 (two) times daily.  [name] Kitchen ketoconazole (NIZORAL) 2 % shampoo Apply 1 application topically 2 (two) times a week.  . meloxicam (MOBIC) 15 MG tablet TAKE 1 TABLET IN THE MORNING WITH BREAKFAST FOR 2 WEEKS, THEN DAILY AS NEEDED FOR PAIN.  [name] Kitchen omeprazole (PRILOSEC) 20 MG capsule Take 20 mg by mouth daily.  . ondansetron (ZOFRAN) 4 MG tablet Take 1 tablet (4 mg total) by mouth  every 8 (eight) hours as needed for nausea or vomiting.  . pantoprazole (PROTONIX) 40 MG tablet TAKE (1) TABLET BY MOUTH ONCE DAILY.  . Probiotic Product (PROBIOTIC & ACIDOPHILUS EX ST PO) Take by mouth.  . promethazine (PHENERGAN) 12.5 MG tablet Take 1 tablet (12.5 mg total) by mouth every 6 (six) hours as needed for nausea or vomiting.  . Pseudoephedrine-Guaifenesin (MUCINEX D MAX STRENGTH PO) Take by mouth every 12  (twelve) hours as needed.  . [DISCONTINUED] citalopram (CELEXA) 10 MG tablet TAKE (1) TABLET BY MOUTH ONCE DAILY.  . [DISCONTINUED] gabapentin (NEURONTIN) 100 MG capsule TAKE (1) CAPSULE BY MOUTH THREE TIMES DAILY.   No facility-administered encounter medications on file as of 03/13/2019.      Family History; Family History  Problem Relation Age of Onset  . Kidney cancer Mother 107  . COPD Mother   . Cancer Mother        renal  . Kidney disease Mother   . Allergic rhinitis Mother   . Asthma Mother   . Heart disease Father   . Allergic rhinitis Father   . COPD Sister   . Depression Sister   . Pancreatic cancer Sister   . Cancer Sister        pancreatic  . Heart disease Brother   . Heart attack Brother   . Heart disease Paternal Uncle   . Diabetes Other        neice  . Thyroid disease Other        neice  . Depression Maternal Aunt   . Depression Maternal Grandfather   . Hypothyroidism Sister   . Hypothyroidism Sister   . Colon cancer Neg Hx   . Rectal cancer Neg Hx   . Stomach cancer Neg Hx   . Colon polyps Neg Hx        Labs:  Recent Results (from the past 2160 hour(s))  Lipid Panel w/reflex Direct LDL     Status: Abnormal   Collection Time: 02/13/19  9:14 AM  Result Value Ref Range   Cholesterol 236 (H) <200 mg/dL   HDL 90 > OR = 50 mg/dL   Triglycerides 54 <150 mg/dL   LDL Cholesterol (Calc) 131 (H) mg/dL (calc)    Comment: Reference range: <100 . Desirable range <100 mg/dL for primary prevention;   <70 mg/dL for patients with CHD or diabetic patients  with > or = 2 CHD risk factors. [name] Kitchen LDL-C is now calculated using the Martin-Hopkins  calculation, which is a validated novel method providing  better accuracy than the Friedewald equation in the  estimation of LDL-C.  Cresenciano Genre et al. Annamaria Helling. [id number]): 2061-2068  (http://education.[facility].com/faq/FAQ164)    Total CHOL/HDL Ratio 2.6 <5.0 (calc)   Non-HDL Cholesterol (Calc) 146 (H) <130 mg/dL  (calc)    Comment: For patients with diabetes plus 1 major ASCVD risk  factor, treating to a non-HDL-C goal of <100 mg/dL  (LDL-C of <70 mg/dL) is considered a therapeutic  option.   TSH     Status: None   Collection Time: 02/13/19  9:14 AM  Result Value Ref Range   TSH 1.88 0.40 - 4.50 mIU/L  CBC w/Diff/Platelet     Status: None   Collection Time: 02/13/19  9:14 AM  Result Value Ref Range   WBC 4.0 3.8 - 10.8 Thousand/uL   RBC 4.41 3.80 - 5.10 Million/uL   Hemoglobin 13.6 11.7 - 15.5 g/dL   HCT 40.5 35.0 - 45.0 %   MCV 91.8 80.0 -  100.0 fL   MCH 30.8 27.0 - 33.0 pg   MCHC 33.6 32.0 - 36.0 g/dL   RDW 12.2 11.0 - 15.0 %   Platelets 363 140 - 400 Thousand/uL   MPV 11.1 7.5 - 12.5 fL   Neutro Abs 2,344 1,500 - 7,800 cells/uL   Lymphs Abs 1,228 850 - 3,900 cells/uL   Absolute Monocytes 304 200 - 950 cells/uL   Eosinophils Absolute 72 15 - 500 cells/uL   Basophils Absolute 52 0 - 200 cells/uL   Neutrophils Relative % 58.6 %   Total Lymphocyte 30.7 %   Monocytes Relative 7.6 %   Eosinophils Relative 1.8 %   Basophils Relative 1.3 %  COMPLETE METABOLIC PANEL WITH GFR     Status: None   Collection Time: 02/13/19  9:14 AM  Result Value Ref Range   Glucose, Bld 86 65 - 99 mg/dL    Comment: .            Fasting reference interval .    BUN 16 7 - 25 mg/dL   Creat 0.77 0.50 - 0.99 mg/dL    Comment: For patients >52 years of age, the reference limit for Creatinine is approximately 13% higher for people identified as African-American. .    GFR, Est Non African American 82 > OR = 60 mL/min/1.25m2   GFR, Est African American 95 > OR = 60 mL/min/1.29m2   BUN/Creatinine Ratio NOT APPLICABLE 6 - 22 (calc)   Sodium 140 135 - 146 mmol/L   Potassium 4.7 3.5 - 5.3 mmol/L   Chloride 101 98 - 110 mmol/L   CO2 30 20 - 32 mmol/L   Calcium 10.2 8.6 - 10.4 mg/dL   Total Protein 7.0 6.1 - 8.1 g/dL   Albumin 4.8 3.6 - 5.1 g/dL   Globulin 2.2 1.9 - 3.7 g/dL (calc)   AG Ratio 2.2 1.0 - 2.5  (calc)   Total Bilirubin 0.5 0.2 - 1.2 mg/dL   Alkaline phosphatase (APISO) 41 37 - 153 U/L   AST 19 10 - 35 U/L   ALT 13 6 - 29 U/L  Vitamin D (25 hydroxy)     Status: None   Collection Time: 02/13/19  9:14 AM  Result Value Ref Range   Vit D, 25-Hydroxy 46 30 - 100 ng/mL    Comment: Vitamin D Status         25-OH Vitamin D: . Deficiency:                    <20 ng/mL Insufficiency:             20 - 29 ng/mL Optimal:                 > or = 30 ng/mL . For 25-OH Vitamin D testing on patients on  D2-supplementation and patients for whom quantitation  of D2 and D3 fractions is required, the [facility](TM) 25-OH VIT D, (D2,D3), LC/MS/MS is recommended: order  code 929 [id number] 2514 (patients >51yrs). See Note 1 . Note 1 . For additional information, please refer to  http://education.[facility].com/faq/FAQ199  (This link is being provided for informational/ educational purposes only.)   B12     Status: None   Collection Time: 02/13/19  9:14 AM  Result Value Ref Range   Vitamin B-12 673 200 - 1,100 pg/mL  HgB A1c     Status: None   Collection Time: 02/13/19  9:14 AM  Result Value Ref Range   Hgb A1c MFr Bld 5.5 <  5.7 % of total Hgb    Comment: For the purpose of screening for the presence of diabetes: . <5.7%       Consistent with the absence of diabetes 5.7-6.4%    Consistent with increased risk for diabetes             (prediabetes) > or =6.5%  Consistent with diabetes . This assay result is consistent with a decreased risk of diabetes. . Currently, no consensus exists regarding use of hemoglobin A1c for diagnosis of diabetes in children. . According to American Diabetes Association (ADA) guidelines, hemoglobin A1c <7.0% represents optimal control in non-pregnant diabetic patients. Different metrics may apply to specific patient populations.  Standards of Medical Care in Diabetes(ADA). .    Mean Plasma Glucose 111 (calc)   eAG (mmol/L) 6.2 (calc)     Mental Status  Examination;   Psychiatric Specialty Exam: Physical Exam  Constitutional: She appears well-nourished.    Review of Systems  Cardiovascular: Negative for palpitations.  Skin: Negative for rash.  Neurological: Negative for headaches.  Psychiatric/Behavioral: Negative for depression and suicidal ideas.    There were no vitals taken for this visit.There is no height or weight on file to calculate BMI.  General Appearance:   Eye Contact::    Speech:  coherent  Volume:  Normal  Mood: fair  Affect: reactive  Thought Process: clear . No psychosis  Orientation:  Full (Time, Place, and Person)  Thought Content:  Rumination  Suicidal Thoughts:  No  Homicidal Thoughts:  No  Memory:  Immediate;   Fair Recent;   Fair  Judgement:  Fair  Insight:  Shallow  Psychomotor Activity:  Increased  Concentration:  Fair  Recall:  Fair  Akathisia:  Negative  Handed:  Right  AIMS (if indicated):     Assets:  Desire for Improvement Physical Health Transportation  Sleep:        Assessment: Axis I: bipolar disorder II unspecified or depressed type. Anxiety disorder NOS. Insomnia  Axis II: deferred  Axis III:  Past Medical History:  Diagnosis Date  . Abnormal Pap smear of cervix   . Allergic rhinitis 11/08/2010  . Anxiety   . Asthma   . Bronchitis   . Herpes simplex of female genitalia   . Hyperlipidemia   . Mitral valve prolapse   . Osteoporosis 11/21/2011  . Urticaria     Axis IV: psychosocial   Treatment Plan and Summary:  Bipolar :doing fair continue gabapentin GAD: fair. Continue celexa, gabapentin Insomnia: fair. .. Work on sleep hygiene Reviewed meds    I discussed the assessment and treatment plan with the patient. The patient was provided an opportunity to ask questions and all were answered. The patient agreed with the plan and demonstrated an understanding of the instructions.   The patient was advised to call back or seek an in-person evaluation if the symptoms  worsen or if the condition fails to improve as anticipated.  Questions addressed FU 4-5 months.  Merian Capron, MD 03/13/2019

## 2019-03-18 ENCOUNTER — Ambulatory Visit (INDEPENDENT_AMBULATORY_CARE_PROVIDER_SITE_OTHER): Payer: Medicare HMO

## 2019-03-18 ENCOUNTER — Ambulatory Visit (INDEPENDENT_AMBULATORY_CARE_PROVIDER_SITE_OTHER): Payer: Medicare HMO | Admitting: Physician Assistant

## 2019-03-18 ENCOUNTER — Other Ambulatory Visit: Payer: Self-pay

## 2019-03-18 ENCOUNTER — Encounter: Payer: Self-pay | Admitting: Physician Assistant

## 2019-03-18 VITALS — BP 104/56 | HR 68 | Ht 66.0 in | Wt 114.0 lb

## 2019-03-18 DIAGNOSIS — G8929 Other chronic pain: Secondary | ICD-10-CM | POA: Diagnosis not present

## 2019-03-18 DIAGNOSIS — M5136 Other intervertebral disc degeneration, lumbar region: Secondary | ICD-10-CM | POA: Diagnosis not present

## 2019-03-18 DIAGNOSIS — R1030 Lower abdominal pain, unspecified: Secondary | ICD-10-CM

## 2019-03-18 DIAGNOSIS — M545 Low back pain, unspecified: Secondary | ICD-10-CM

## 2019-03-18 DIAGNOSIS — I951 Orthostatic hypotension: Secondary | ICD-10-CM

## 2019-03-18 DIAGNOSIS — R0602 Shortness of breath: Secondary | ICD-10-CM | POA: Diagnosis not present

## 2019-03-18 LAB — POCT URINALYSIS DIPSTICK
Bilirubin, UA: NEGATIVE
Blood, UA: NEGATIVE
Glucose, UA: NEGATIVE
Ketones, UA: NEGATIVE
Leukocytes, UA: NEGATIVE
Nitrite, UA: NEGATIVE
Protein, UA: NEGATIVE
Spec Grav, UA: 1.025 (ref 1.010–1.025)
Urobilinogen, UA: 0.2 E.U./dL
pH, UA: 5.5 (ref 5.0–8.0)

## 2019-03-18 MED ORDER — PREDNISONE 50 MG PO TABS: 50.0000 mg | ORAL_TABLET | Freq: Every day | ORAL | 0 refills | Status: DC

## 2019-03-18 MED ORDER — PHENAZOPYRIDINE HCL 200 MG PO TABS: 200.0000 mg | ORAL_TABLET | Freq: Three times a day (TID) | ORAL | 0 refills | Status: AC

## 2019-03-18 NOTE — Patient Instructions (Signed)
Orthostatic Hypotension °Blood pressure is a measurement of how strongly, or weakly, your blood is pressing against the walls of your arteries. Orthostatic hypotension is a sudden drop in blood pressure that happens when you quickly change positions, such as when you get up from sitting or lying down. °Arteries are blood vessels that carry blood from your heart throughout your body. When blood pressure is too low, you may not get enough blood to your brain or to the rest of your organs. This can cause weakness, light-headedness, rapid heartbeat, and fainting. This can last for just a few seconds or for up to a few minutes. Orthostatic hypotension is usually not a serious problem. However, if it happens frequently or gets worse, it may be a sign of something more serious. °What are the causes? °This condition may be caused by: °· Sudden changes in posture, such as standing up quickly after you have been sitting or lying down. °· Blood loss. °· Loss of body fluids (dehydration). °· Heart problems. °· Hormone (endocrine) problems. °· Pregnancy. °· Severe infection. °· Lack of certain nutrients. °· Severe allergic reactions (anaphylaxis). °· Certain medicines, such as blood pressure medicine or medicines that make the body lose excess fluids (diuretics). Sometimes, this condition can be caused by not taking medicine as directed, such as taking too much of a certain medicine. °What increases the risk? °The following factors may make you more likely to develop this condition: °· Age. Risk increases as you get older. °· Conditions that affect the heart or the central nervous system. °· Taking certain medicines, such as blood pressure medicine or diuretics. °· Being pregnant. °What are the signs or symptoms? °Symptoms of this condition may include: °· Weakness. °· Light-headedness. °· Dizziness. °· Blurred vision. °· Fatigue. °· Rapid heartbeat. °· Fainting, in severe cases. °How is this diagnosed? °This condition is  diagnosed based on: °· Your medical history. °· Your symptoms. °· Your blood pressure measurement. Your health care provider will check your blood pressure when you are: °? Lying down. °? Sitting. °? Standing. °A blood pressure reading is recorded as two numbers, such as "120 over 80" (or 120/80). The first ("top") number is called the systolic pressure. It is a measure of the pressure in your arteries as your heart beats. The second ("bottom") number is called the diastolic pressure. It is a measure of the pressure in your arteries when your heart relaxes between beats. Blood pressure is measured in a unit called mm Hg. Healthy blood pressure for most adults is 120/80. If your blood pressure is below 90/60, you may be diagnosed with hypotension. °Other information or tests that may be used to diagnose orthostatic hypotension include: °· Your other vital signs, such as your heart rate and temperature. °· Blood tests. °· Tilt table test. For this test, you will be safely secured to a table that moves you from a lying position to an upright position. Your heart rhythm and blood pressure will be monitored during the test. °How is this treated? °This condition may be treated by: °· Changing your diet. This may involve eating more salt (sodium) or drinking more water. °· Taking medicines to raise your blood pressure. °· Changing the dosage of certain medicines you are taking that might be lowering your blood pressure. °· Wearing compression stockings. These stockings help to prevent blood clots and reduce swelling in your legs. °In some cases, you may need to go to the hospital for: °· Fluid replacement. This means you will   receive fluids through an IV. °· Blood replacement. This means you will receive donated blood through an IV (transfusion). °· Treating an infection or heart problems, if this applies. °· Monitoring. You may need to be monitored while medicines that you are taking wear off. °Follow these instructions  at home: °Eating and drinking ° °· Drink enough fluid to keep your urine pale yellow. °· Eat a healthy diet, and follow instructions from your health care provider about eating or drinking restrictions. A healthy diet includes: °? Fresh fruits and vegetables. °? Whole grains. °? Lean meats. °? Low-fat dairy products. °· Eat extra salt only as directed. Do not add extra salt to your diet unless your health care provider told you to do that. °· Eat frequent, small meals. °· Avoid standing up suddenly after eating. °Medicines °· Take over-the-counter and prescription medicines only as told by your health care provider. °? Follow instructions from your health care provider about changing the dosage of your current medicines, if this applies. °? Do not stop or adjust any of your medicines on your own. °General instructions ° °· Wear compression stockings as told by your health care provider. °· Get up slowly from lying down or sitting positions. This gives your blood pressure a chance to adjust. °· Avoid hot showers and excessive heat as directed by your health care provider. °· Return to your normal activities as told by your health care provider. Ask your health care provider what activities are safe for you. °· Do not use any products that contain nicotine or tobacco, such as cigarettes, e-cigarettes, and chewing tobacco. If you need help quitting, ask your health care provider. °· Keep all follow-up visits as told by your health care provider. This is important. °Contact a health care provider if you: °· Vomit. °· Have diarrhea. °· Have a fever for more than 2-3 days. °· Feel more thirsty than usual. °· Feel weak and tired. °Get help right away if you: °· Have chest pain. °· Have a fast or irregular heartbeat. °· Develop numbness in any part of your body. °· Cannot move your arms or your legs. °· Have trouble speaking. °· Become sweaty or feel light-headed. °· Faint. °· Feel short of breath. °· Have trouble staying  awake. °· Feel confused. °Summary °· Orthostatic hypotension is a sudden drop in blood pressure that happens when you quickly change positions. °· Orthostatic hypotension is usually not a serious problem. °· It is diagnosed by having your blood pressure taken lying down, sitting, and then standing. °· It may be treated by changing your diet or adjusting your medicines. °This information is not intended to replace advice given to you by your health care provider. Make sure you discuss any questions you have with your health care provider. °Document Released: 06/01/2002 Document Revised: 12/05/2017 Document Reviewed: 12/05/2017 °Elsevier Patient Education © 2020 [employer] ° °

## 2019-03-18 NOTE — Progress Notes (Signed)
Subjective:    Patient ID: Kellie [name], female    DOB: 03-Apr-1956, 63 y.o.   MRN: [id number]  HPI  Pt is a 63 yo female with known lumbar/cervical DDD who presents to the clinic with worsening low back pain with radiation into her legs. No new injury. Pain worsening for last 2 and 1/2 weeks. She is taking tylenol and as needed mobic. No saddle anesthesia or bowel or bladder dysfunction. She does not notice any leg weakness.   She c/o of dizziness when she stands or changes position from low to high.   She is having some lower abdominal discomfort. She wants to make sure not UTI. No urine odor, discharge, flank pain, dysuria, frequency.   Pt feels SOB at times. Never smoked. Wants to make sure nothing wrong with lungs. No wheezing.   Pt would like to go over pelvic u/s results.   Active Ambulatory Problems    Diagnosis Date Noted  . GERD (gastroesophageal reflux disease) 09/27/2010  . Allergic rhinitis 11/08/2010  . Insomnia 11/08/2010  . Osteoporosis 11/21/2011  . Borderline intellectual functioning 02/15/2012  . Genital herpes 11/05/2012  . Hemorrhoids 11/05/2012  . Chronic periodontal disease 06/30/2013  . Adhesive capsulitis of right shoulder 11/24/2013  . Encounter for gynecological examination with Papanicolaou smear of cervix 12/08/2013  . Prediabetes 12/14/2013  . Postmenopausal atrophic vaginitis 03/17/2014  . Bipolar disorder with depression ([place]) 06/02/2014  . Family history of renal cancer 10/11/2014  . DDD (degenerative disc disease), lumbar 10/11/2014  . Rhinitis, allergic 10/14/2014  . Near syncope 02/09/2015  . Chronic nasal congestion 03/01/2015  . Vitreous floaters of right eye 03/01/2015  . DDD (degenerative disc disease), cervical 07/05/2015  . Cough, persistent 07/21/2015  . Post-nasal drip 07/21/2015  . Left knee pain 11/09/2015  . Chronic pain of both knees 11/09/2015  . Plantar fasciitis, bilateral 11/09/2015  . Hoarseness 03/09/2016  . Pulmonary  nodule 08/08/2016  . Bipolar 1 disorder with moderate mania ([place]) 09/17/2016  . Panic attacks 09/18/2016  . SOB (shortness of breath) 09/18/2016  . Weakness 10/05/2016  . Family history of pancreatic cancer 02/18/2017  . Diarrhea 02/18/2017  . Loss of weight 02/18/2017  . Unintentional weight loss 05/18/2017  . Elevated LDL cholesterol level 05/18/2017  . Hyperlipidemia 05/18/2017  . Irritable bowel syndrome with both constipation and diarrhea 05/18/2017  . History of loop electrical excision procedure (LEEP) 07/01/2017  . Atypical chest pain 10/22/2017  . Anxiety 10/22/2017  . Aortic insufficiency 01/10/2018  . Non-restorative sleep 01/10/2018  . Snoring 01/10/2018  . Recent skin changes 03/13/2018  . Periodic limb movement 03/13/2018  . Nocturnal leg cramps 03/13/2018  . Vaginal dryness 09/02/2018  . History of diverticulitis 09/16/2018  . Chronic pain of both ankles 01/15/2019  . Cervical stenosis (uterine cervix) 02/13/2019  . Vitamin D insufficiency 02/16/2019   Resolved Ambulatory Problems    Diagnosis Date Noted  . Sinusitis 09/27/2010  . Depression 09/27/2010  . Bronchitis 12/04/2010  . Cough 06/02/2011  . General medical examination 10/18/2011  . Dizziness 11/21/2011  . Knee pain 11/21/2011  . Low back pain 11/21/2011  . Heel pain 07/14/2012  . Thoracic myofascial strain 03/13/2013  . Sinusitis 04/20/2013  . Cough 04/22/2013  . Rotator cuff disorder 08/01/2013  . Right shoulder pain 10/27/2013  . Adhesive capsulitis 11/02/2013  . Pain in joint, shoulder region 11/24/2013  . Bipolar disorder ([place]) 04/29/2014  . Right serous otitis media 08/03/2014  . Acute recurrent maxillary sinusitis 10/14/2014  .  Right-sided low back pain without sciatica 10/14/2014  . Sinusitis 02/09/2015  . Sacroiliac joint dysfunction of right side 02/25/2015  . Piriformis syndrome 03/01/2015  . Adhesive capsulitis 07/05/2015  . Radiculitis of right cervical region 07/06/2015  .  Drug reaction 09/25/2016  . Mitral valve prolapse 01/10/2018   Past Medical History:  Diagnosis Date  . Abnormal Pap smear of cervix   . Allergic rhinitis 11/08/2010  . Asthma   . Herpes simplex of female genitalia   . Urticaria      Review of Systems  See HPI.      Objective:   Physical Exam Vitals signs reviewed.  Constitutional:      Appearance: Normal appearance.  HENT:     Head: Normocephalic.  Cardiovascular:     Rate and Rhythm: Normal rate and regular rhythm.     Pulses: Normal pulses.  Pulmonary:     Effort: Pulmonary effort is normal.     Breath sounds: Normal breath sounds.  Abdominal:     Palpations: Abdomen is soft.  Musculoskeletal:        General: No swelling or tenderness.     Right lower leg: No edema.     Left lower leg: No edema.     Comments: No lumbar spinal tenderness.  Tenderness over SI joint bilaterally and piriformis.  Negative straight leg test.  Strength lower ext 5/5.    Neurological:     General: No focal deficit present.     Mental Status: She is alert and oriented to person, place, and time.     Deep Tendon Reflexes: Reflexes normal.  Psychiatric:        Mood and Affect: Mood normal.           Assessment & Plan:  [name] KitchenMarland KitchenShamequa was seen today for follow-up.  Diagnoses and all orders for this visit:  Orthostatic hypotension  Lower abdominal pain -     POCT Urinalysis Dipstick -     phenazopyridine (PYRIDIUM) 200 MG tablet; Take 1 tablet (200 mg total) by mouth 3 (three) times daily for 2 days. -     Urine Culture  Chronic bilateral low back pain without sciatica -     DG Lumbar Spine Complete -     predniSONE (DELTASONE) 50 MG tablet; Take 1 tablet (50 mg total) by mouth daily.  Other intervertebral disc degeneration, lumbar region -     MR Lumbar Spine Wo Contrast  SOB (shortness of breath)   Orthostatic BP confirmed.  HO given.  Discussed treatment. Pt is going to drink more fluid and eat more pickles. Be careful  when standing. Follow up as needed.   .. Results for orders placed or performed in visit on 03/18/19  Urine Culture   Specimen: Urine  Result Value Ref Range   MICRO NUMBER: [id number]    SPECIMEN QUALITY: Adequate    Sample Source URINE    STATUS: FINAL    Result: No Growth   POCT Urinalysis Dipstick  Result Value Ref Range   Color, UA yellow    Clarity, UA clear    Glucose, UA Negative Negative   Bilirubin, UA negative    Ketones, UA negative    Spec Grav, UA 1.025 1.010 - 1.025   Blood, UA negative    pH, UA 5.5 5.0 - 8.0   Protein, UA Negative Negative   Urobilinogen, UA 0.2 0.2 or 1.0 E.U./dL   Nitrite, UA negative    Leukocytes, UA Negative Negative   Appearance  Odor     UA negative. Will culture. Given pyridium in the meantime for bladder inflamation.   Discussed pelvic u/s with no need for follow up.   Will get xrays of lumbar spine.  Ordered MrI.  No red flags today.  Prednisone for 5 days given.  Pt is due to start PT soon.   SOB lungs sound great today. Can evaluate SOB with spironmetry.

## 2019-03-19 NOTE — Progress Notes (Signed)
Bone do show signs of osteoporosis.  There is Disc space narrowing at L4/L5 that has increased some since 2018.  SI joints look pretty good.  Prednisone and PT should help the most with this.  If no improvement could consider MRI but I do not think the cause of both of your legs/buttocks pain is the one L4/L5 disc. If it was that disc it would only be in that nerve dermatome not the entire leg.

## 2019-03-20 LAB — URINE CULTURE
MICRO NUMBER:: 914821
Result:: NO GROWTH
SPECIMEN QUALITY:: ADEQUATE

## 2019-03-20 NOTE — Progress Notes (Signed)
No bacterial growth on urine culture.

## 2019-03-23 ENCOUNTER — Encounter: Payer: Self-pay | Admitting: Physical Therapy

## 2019-03-23 ENCOUNTER — Other Ambulatory Visit: Payer: Self-pay

## 2019-03-23 ENCOUNTER — Ambulatory Visit: Payer: Medicare HMO | Attending: Physician Assistant | Admitting: Physical Therapy

## 2019-03-23 DIAGNOSIS — M545 Low back pain: Secondary | ICD-10-CM | POA: Insufficient documentation

## 2019-03-23 DIAGNOSIS — M6281 Muscle weakness (generalized): Secondary | ICD-10-CM | POA: Insufficient documentation

## 2019-03-23 DIAGNOSIS — G8929 Other chronic pain: Secondary | ICD-10-CM | POA: Diagnosis not present

## 2019-03-23 DIAGNOSIS — R293 Abnormal posture: Secondary | ICD-10-CM | POA: Insufficient documentation

## 2019-03-23 NOTE — Patient Instructions (Addendum)
Pt issued handouts on osteoporosis, risk factors, explanation, recommendations for tx.  Exercise, meds and diet/lifestyle  Contraindications - no lumbar flexion, avoid "C" spine.

## 2019-03-23 NOTE — Progress Notes (Signed)
MRI ordered

## 2019-03-23 NOTE — Therapy (Signed)
[place], [place], [address] Phone: [phone]   Fax:  [phone]  Physical Therapy Evaluation  Patient Details  Name: Kellie [name] MRN: [id number] Date of Birth: 03/10/1956 Referring Provider (PT): [name] PA-C   Encounter Date: 03/23/2019  PT End of Session - 03/23/19 [id number]    Visit Number  1    Number of Visits  4    Date for PT Re-Evaluation  04/20/19    PT Start Time  0928    PT Stop Time  1013    PT Time Calculation (min)  45 min    Activity Tolerance  Patient tolerated treatment well    Behavior During Therapy  [facility] for tasks assessed/performed       Past Medical History:  Diagnosis Date  . Abnormal Pap smear of cervix   . Allergic rhinitis 11/08/2010  . Anxiety   . Asthma   . Bronchitis   . Herpes simplex of female genitalia   . Hyperlipidemia   . Mitral valve prolapse   . Osteoporosis 11/21/2011  . Urticaria     Past Surgical History:  Procedure Laterality Date  . CLOSED MANIPULATION SHOULDER  7&01/2005   x2, 30 days apart  . CLOSED MANIPULATION SHOULDER  2006   Left  . COLONOSCOPY  09/2012   Medium sized external hemorrhoids, few small divertic in left colon, otherwise normal colon (repeat 10 yrs)  . ESOPHAGOGASTRODUODENOSCOPY  09/2012   Normal (Dr. Ardis Hughs)  . LEEP  11/2004    There were no vitals filed for this visit.   Subjective Assessment - 03/23/19 0928    Subjective  Pt reports she asked for more PT as her new bone density scan and her numbers are worse.  She hasn't been able to go to the gym since covid.  She has a lot of pain and hasn't been able to do her exercise.    Diagnostic tests  bone density    Currently in Pain?  Yes    Pain Score  9     Pain Location  Back    Pain Orientation  Right;Lower    Pain Descriptors / Indicators  Aching    Pain Type  Chronic pain    Pain Onset  More than a month ago    Pain Frequency  Constant    Aggravating Factors    bending over    Pain Relieving Factors  not a lot    Multiple Pain Sites  Yes    Pain Score  9    Pain Location  Foot    Pain Orientation  Left;Right    Pain Descriptors / Indicators  Aching    Pain Type  Chronic pain    Pain Onset  More than a month ago    Pain Frequency  Constant    Aggravating Factors   walking    Pain Relieving Factors  not sure         [facility] PT Assessment - 03/23/19 0001      Assessment   Medical Diagnosis  Osteoporosis    Referring Provider (PT)  [name] PA-C    Onset Date/Surgical Date  02/25/19   after most recent bone density scn   Hand Dominance  Right    Next MD Visit  PRN    Prior Therapy  yes      Precautions   Precautions  Other (comment)    Precaution Comments  Osteoporosis - no trunk flexion  Balance Screen   Has the patient fallen in the past 6 months  No      Prior Function   Level of Independence  Independent    Vocation  On disability    Leisure  attend church, and exercise socialize as needed      Posture/Postural Control   Posture/Postural Control  --   small framed with decreased spinal curves     ROM / Strength   AROM / PROM / Strength  AROM;Strength      AROM   Overall AROM Comments  bilat UE's WNL     AROM Assessment Site  Hip;Ankle    Right/Left Hip  --   WNL except Rt hip extension 10   Right/Left Ankle  --   WNL     Strength   Strength Assessment Site  Hip;Knee;Ankle;Lumbar;Shoulder    Right/Left Shoulder  --   WNL except abduction 4/5   Right/Left Hip  --   grossly 4/5 bilat   Right/Left Knee  --   WNL   Right/Left Ankle  --   WNL   Lumbar Flexion  --   TA good (-)    Lumbar Extension  --   multifidi good     Flexibility   Soft Tissue Assessment /Muscle Length  --   WNL               Objective measurements completed on examination: See above findings.              PT Education - 03/23/19 1022    Education Details  osteoporosis ed    Person(s) Educated  Patient     Methods  Explanation;Handout    Comprehension  Verbalized understanding          PT Long Term Goals - 03/23/19 1136      PT LONG TERM GOAL #1   Title  Verbally report contraindicated movement patterns with dx of osteoporsis ( 04/20/2019    Time  4    Period  Weeks    Status  New    Target Date  04/20/19      PT LONG TERM GOAL #2   Title  I with HEP to promote weightbearing exercise for bone growth to prevent risk of fx ( 04/20/2019)    Time  4    Period  Weeks    Status  New    Target Date  04/20/19      PT LONG TERM GOAL #3   Title  improve bilat hip abduction strength -/> 5-/5 to allow functional movement exercises ( side lunges/squats/walking program) ( 04/20/2019)    Time  4    Period  Weeks    Status  New    Target Date  04/20/19      PT LONG TERM GOAL #4   Title  report =/> 50% reduction in hip pain to allow her to perform light dancing at home ( 04/20/2019)    Time  4    Period  Weeks    Status  New    Target Date  04/20/19      PT LONG TERM GOAL #5   Title  demonstrate ability to maintain upright posture throughout full treatment session due to improved strength and body awareness ( 04/20/2019)    Time  4    Period  Weeks    Status  New    Target Date  04/20/19  Plan - 03/23/19 1114    Clinical Impression Statement  63 yo female with progressive osteoporosis.  She likes to exercise and has been doing some at home.  Not able to go to the gym d/t covid at this time.  She has not had education precautions and recommendation for osteoporosis. She does have some weakness in her hips and core along with pain in the Rt hip/buttocks. Rhys would benefit from PT to educate on safe movement patterns to prevent risk of fx along with education on safe exercise to promote bone growth.    Personal Factors and Comorbidities  Age;Behavior Pattern;Comorbidity 3+;Past/Current Experience    Comorbidities  bipolar, anxiety, asthma, DDD lumbar spine, h/o  frozen shoulder, post menopasal    Examination-Activity Limitations  Other    Examination-Participation Restrictions  Other;Community Activity    Stability/Clinical Decision Making  Stable/Uncomplicated    Clinical Decision Making  Low    Rehab Potential  Fair    PT Frequency  1x / week    PT Duration  4 weeks    PT Treatment/Interventions  Functional mobility training;Patient/family education;Therapeutic activities;Manual techniques;Therapeutic exercise    PT Next Visit Plan  cont education on osteoporosis, body mechanics and safe exercise for bone growth    Consulted and Agree with Plan of Care  Patient       Patient will benefit from skilled therapeutic intervention in order to improve the following deficits and impairments:  Pain, Decreased knowledge of precautions  Visit Diagnosis: Muscle weakness (generalized) - Plan: PT plan of care cert/re-cert  Abnormal posture - Plan: PT plan of care cert/re-cert  Chronic bilateral low back pain without sciatica - Plan: PT plan of care cert/re-cert     Problem List Patient Active Problem List   Diagnosis Date Noted  . Vitamin D insufficiency 02/16/2019  . Cervical stenosis (uterine cervix) 02/13/2019  . Chronic pain of both ankles 01/15/2019  . History of diverticulitis 09/16/2018  . Vaginal dryness 09/02/2018  . Recent skin changes 03/13/2018  . Periodic limb movement 03/13/2018  . Nocturnal leg cramps 03/13/2018  . Aortic insufficiency 01/10/2018  . Non-restorative sleep 01/10/2018  . Snoring 01/10/2018  . Atypical chest pain 10/22/2017  . Anxiety 10/22/2017  . History of loop electrical excision procedure (LEEP) 07/01/2017  . Unintentional weight loss 05/18/2017  . Elevated LDL cholesterol level 05/18/2017  . Hyperlipidemia 05/18/2017  . Irritable bowel syndrome with both constipation and diarrhea 05/18/2017  . Family history of pancreatic cancer 02/18/2017  . Diarrhea 02/18/2017  . Loss of weight 02/18/2017  . Weakness  10/05/2016  . Panic attacks 09/18/2016  . SOB (shortness of breath) 09/18/2016  . Bipolar 1 disorder with moderate mania ([place]) 09/17/2016  . Pulmonary nodule 08/08/2016  . Hoarseness 03/09/2016  . Left knee pain 11/09/2015  . Chronic pain of both knees 11/09/2015  . Plantar fasciitis, bilateral 11/09/2015  . Cough, persistent 07/21/2015  . Post-nasal drip 07/21/2015  . DDD (degenerative disc disease), cervical 07/05/2015  . Chronic nasal congestion 03/01/2015  . Vitreous floaters of right eye 03/01/2015  . Near syncope 02/09/2015  . Rhinitis, allergic 10/14/2014  . Family history of renal cancer 10/11/2014  . DDD (degenerative disc disease), lumbar 10/11/2014  . Bipolar disorder with depression ([place]) 06/02/2014  . Postmenopausal atrophic vaginitis 03/17/2014  . Prediabetes 12/14/2013  . Encounter for gynecological examination with Papanicolaou smear of cervix 12/08/2013  . Adhesive capsulitis of right shoulder 11/24/2013  . Chronic periodontal disease 06/30/2013  . Genital herpes 11/05/2012  .  Hemorrhoids 11/05/2012  . Borderline intellectual functioning 02/15/2012  . Osteoporosis 11/21/2011  . Allergic rhinitis 11/08/2010  . Insomnia 11/08/2010  . GERD (gastroesophageal reflux disease) 09/27/2010    Jeral Pinch PT  03/23/2019, 11:42 AM  [facility] [address] [place], [place], [address] Phone: [phone]   Fax:  [phone]  Name: SONRISA BERARDI MRN: [id number] Date of Birth: 25-Apr-1956

## 2019-03-24 ENCOUNTER — Encounter: Payer: Self-pay | Admitting: Physician Assistant

## 2019-03-30 ENCOUNTER — Other Ambulatory Visit: Payer: Self-pay

## 2019-03-30 ENCOUNTER — Ambulatory Visit (INDEPENDENT_AMBULATORY_CARE_PROVIDER_SITE_OTHER): Payer: Medicare HMO

## 2019-03-30 DIAGNOSIS — M5136 Other intervertebral disc degeneration, lumbar region: Secondary | ICD-10-CM

## 2019-03-30 DIAGNOSIS — M545 Low back pain: Secondary | ICD-10-CM | POA: Diagnosis not present

## 2019-03-31 NOTE — Progress Notes (Signed)
[name],  Minimal disc bulge at L4-5. No nerve root impingement. This should not cause your bilateral leg weakness/pain. You certainly could follow up with Dr. Darene Lamer or Dr. Georgina Snell to get their opinions as well.   Kellie [name]

## 2019-04-02 ENCOUNTER — Other Ambulatory Visit: Payer: Self-pay

## 2019-04-02 ENCOUNTER — Ambulatory Visit (INDEPENDENT_AMBULATORY_CARE_PROVIDER_SITE_OTHER): Payer: Medicare HMO | Admitting: Sports Medicine

## 2019-04-02 ENCOUNTER — Encounter: Payer: Self-pay | Admitting: Sports Medicine

## 2019-04-02 DIAGNOSIS — M722 Plantar fascial fibromatosis: Secondary | ICD-10-CM | POA: Diagnosis not present

## 2019-04-02 DIAGNOSIS — M5136 Other intervertebral disc degeneration, lumbar region: Secondary | ICD-10-CM | POA: Diagnosis not present

## 2019-04-02 DIAGNOSIS — M5030 Other cervical disc degeneration, unspecified cervical region: Secondary | ICD-10-CM

## 2019-04-02 DIAGNOSIS — M51369 Other intervertebral disc degeneration, lumbar region without mention of lumbar back pain or lower extremity pain: Secondary | ICD-10-CM

## 2019-04-02 MED ORDER — MELOXICAM 15 MG PO TABS: ORAL_TABLET | ORAL | 5 refills | Status: DC

## 2019-04-02 MED ORDER — CYCLOBENZAPRINE HCL 10 MG PO TABS: ORAL_TABLET | ORAL | 0 refills | Status: DC

## 2019-04-02 NOTE — Assessment & Plan Note (Signed)
L4-L5 DDD with desiccation, no overt neuroforaminal stenosis. This however can cause discogenic pain, worse with sitting, flexion, Valsalva. Formal physical therapy, meloxicam, Flexeril at night. Return in 6 weeks, epidural if no better, we did discuss the developmental anthropology and natural history of degenerative disc disease.
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
## 2019-04-02 NOTE — Assessment & Plan Note (Signed)
Rehab, custom orthotics.

## 2019-04-06 ENCOUNTER — Other Ambulatory Visit: Payer: Self-pay

## 2019-04-06 ENCOUNTER — Encounter: Payer: Self-pay | Admitting: Family Medicine

## 2019-04-06 ENCOUNTER — Ambulatory Visit (INDEPENDENT_AMBULATORY_CARE_PROVIDER_SITE_OTHER): Payer: Medicare HMO | Admitting: Family Medicine

## 2019-04-06 DIAGNOSIS — M722 Plantar fascial fibromatosis: Secondary | ICD-10-CM | POA: Diagnosis not present

## 2019-04-06 NOTE — Assessment & Plan Note (Signed)
Pain seems most consistent with plantar fasciitis.  Less likely for stress reaction or fat pad syndrome. -Orthotics today. -Counseled on orthotics and their use. -Counseled on home exercise therapy and supportive care. -Could consider adding lateral wedge if needed.
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
## 2019-04-07 ENCOUNTER — Encounter: Payer: Self-pay | Admitting: Physical Therapy

## 2019-04-07 ENCOUNTER — Ambulatory Visit: Payer: Medicare HMO | Attending: Physician Assistant | Admitting: Physical Therapy

## 2019-04-07 DIAGNOSIS — R293 Abnormal posture: Secondary | ICD-10-CM

## 2019-04-07 DIAGNOSIS — M545 Low back pain, unspecified: Secondary | ICD-10-CM

## 2019-04-07 DIAGNOSIS — M6281 Muscle weakness (generalized): Secondary | ICD-10-CM | POA: Insufficient documentation

## 2019-04-07 DIAGNOSIS — G8929 Other chronic pain: Secondary | ICD-10-CM | POA: Diagnosis not present

## 2019-04-07 NOTE — Therapy (Signed)
[facility] [address], [place], [place], [address] Phone: [phone]   Fax:  [phone]  Physical Therapy Treatment  Patient Details  Name: Kellie [name] MRN: [id number] Date of Birth: [birth year] Referring Provider (PT): [name] PA-C   Encounter Date: 04/07/2019  PT End of Session - 04/07/19 0935    Visit Number  2    Number of Visits  4    Date for PT Re-Evaluation  04/20/19    PT Start Time  0842    PT Stop Time  0929    PT Time Calculation (min)  47 min    Activity Tolerance  Patient tolerated treatment well;No increased pain    Behavior During Therapy  WFL for tasks assessed/performed       Past Medical History:  Diagnosis Date  . Abnormal Pap smear of cervix   . Allergic rhinitis 11/08/2010  . Anxiety   . Asthma   . Bronchitis   . Herpes simplex of female genitalia   . Hyperlipidemia   . Mitral valve prolapse   . Osteoporosis 11/21/2011  . Urticaria     Past Surgical History:  Procedure Laterality Date  . CLOSED MANIPULATION SHOULDER  7&01/2005   x2, 30 days apart  . CLOSED MANIPULATION SHOULDER  2006   Left  . COLONOSCOPY  09/2012   Medium sized external hemorrhoids, few small divertic in left colon, otherwise normal colon (repeat 10 yrs)  . ESOPHAGOGASTRODUODENOSCOPY  09/2012   Normal (Dr. Ardis Hughs)  . LEEP  11/2004    There were no vitals filed for this visit.  Subjective Assessment - 04/07/19 0849    Subjective  Pt states that things are going well. She needs some exercises to work on at home.    Diagnostic tests  bone density    Currently in Pain?  No/denies    Pain Onset  More than a month ago    Pain Onset  More than a month ago                       [facility] Adult PT Treatment/Exercise - 04/07/19 0001      Exercises   Exercises  Lumbar      Lumbar Exercises: Aerobic   Nustep  L1 x5 min, PT present to discuss gym options      Lumbar Exercises: Machines for  Strengthening   Leg Press  seat 6, inlcine 3: BLE #90 2x15 reps       Lumbar Exercises: Standing   Other Standing Lumbar Exercises  back lunge slider x15 reps each side       Lumbar Exercises: Supine   Dead Bug  10 reps    Dead Bug Limitations  cuing to address     Bridge  15 reps    Bridge with [employer] Limitations  unable without compensations      Lumbar Exercises: Sidelying   Other Sidelying Lumbar Exercises  plank on elbows/knees 7x10 sec each side             PT Education - 04/07/19 0935    Education Details  implemented and reviewed HEP    Person(s) Educated  Patient    Methods  Explanation;Handout;Verbal cues    Comprehension  Verbalized understanding;Returned demonstration          PT Long Term Goals - 03/23/19 1136      PT LONG TERM GOAL #1   Title  Verbally report contraindicated  movement patterns with dx of osteoporsis ( 04/20/2019    Time  4    Period  Weeks    Status  New    Target Date  04/20/19      PT LONG TERM GOAL #2   Title  I with HEP to promote weightbearing exercise for bone growth to prevent risk of fx ( 04/20/2019)    Time  4    Period  Weeks    Status  New    Target Date  04/20/19      PT LONG TERM GOAL #3   Title  improve bilat hip abduction strength -/> 5-/5 to allow functional movement exercises ( side lunges/squats/walking program) ( 04/20/2019)    Time  4    Period  Weeks    Status  New    Target Date  04/20/19      PT LONG TERM GOAL #4   Title  report =/> 50% reduction in hip pain to allow her to perform light dancing at home ( 04/20/2019)    Time  4    Period  Weeks    Status  New    Target Date  04/20/19      PT LONG TERM GOAL #5   Title  demonstrate ability to maintain upright posture throughout full treatment session due to improved strength and body awareness ( 04/20/2019)    Time  4    Period  Weeks    Status  New    Target Date  04/20/19            Plan - 04/07/19 0936    Clinical Impression  Statement  Pt feels weak with her daily activity. Pt was eager to implement a HEP for her to complete throughout the week. Pt did demonstrate trunk weakness with supine therex completed today but was able to improve her technique with therapist providing modifications to activities. Pt denied increase in pain with today's exercises and would benefit from continued HEP updates and therex progressions moving forward.    Personal Factors and Comorbidities  Age;Behavior Pattern;Comorbidity 3+;Past/Current Experience    Comorbidities  bipolar, anxiety, asthma, DDD lumbar spine, h/o frozen shoulder, post menopasal    Examination-Activity Limitations  Other    Examination-Participation Restrictions  Other;Community Activity    Stability/Clinical Decision Making  Stable/Uncomplicated    Rehab Potential  Fair    PT Frequency  1x / week    PT Duration  4 weeks    PT Treatment/Interventions  Functional mobility training;Patient/family education;Therapeutic activities;Manual techniques;Therapeutic exercise    PT Next Visit Plan  body mechanics and safe exercise for bone growth; progression of trunk/LE strength    Consulted and Agree with Plan of Care  Patient       Patient will benefit from skilled therapeutic intervention in order to improve the following deficits and impairments:  Pain, Decreased knowledge of precautions  Visit Diagnosis: Muscle weakness (generalized)  Abnormal posture  Chronic bilateral low back pain without sciatica     Problem List Patient Active Problem List   Diagnosis Date Noted  . Plantar fasciitis, right 04/02/2019  . Vitamin D insufficiency 02/16/2019  . Cervical stenosis (uterine cervix) 02/13/2019  . Chronic pain of both ankles 01/15/2019  . History of diverticulitis 09/16/2018  . Vaginal dryness 09/02/2018  . Recent skin changes 03/13/2018  . Periodic limb movement 03/13/2018  . Nocturnal leg cramps 03/13/2018  . Aortic insufficiency 01/10/2018  .  Non-restorative sleep 01/10/2018  . Snoring 01/10/2018  .  Atypical chest pain 10/22/2017  . Anxiety 10/22/2017  . History of loop electrical excision procedure (LEEP) 07/01/2017  . Unintentional weight loss 05/18/2017  . Elevated LDL cholesterol level 05/18/2017  . Hyperlipidemia 05/18/2017  . Irritable bowel syndrome with both constipation and diarrhea 05/18/2017  . Family history of pancreatic cancer 02/18/2017  . Diarrhea 02/18/2017  . Loss of weight 02/18/2017  . Weakness 10/05/2016  . Panic attacks 09/18/2016  . SOB (shortness of breath) 09/18/2016  . Bipolar 1 disorder with moderate mania ([place]) 09/17/2016  . Pulmonary nodule 08/08/2016  . Hoarseness 03/09/2016  . Left knee pain 11/09/2015  . Chronic pain of both knees 11/09/2015  . Plantar fasciitis, bilateral 11/09/2015  . Cough, persistent 07/21/2015  . Post-nasal drip 07/21/2015  . DDD (degenerative disc disease), cervical 07/05/2015  . Chronic nasal congestion 03/01/2015  . Vitreous floaters of right eye 03/01/2015  . Near syncope 02/09/2015  . Rhinitis, allergic 10/14/2014  . Family history of renal cancer 10/11/2014  . DDD (degenerative disc disease), lumbar 10/11/2014  . Bipolar disorder with depression ([place]) 06/02/2014  . Postmenopausal atrophic vaginitis 03/17/2014  . Prediabetes 12/14/2013  . Encounter for gynecological examination with Papanicolaou smear of cervix 12/08/2013  . Adhesive capsulitis of right shoulder 11/24/2013  . Chronic periodontal disease 06/30/2013  . Genital herpes 11/05/2012  . Hemorrhoids 11/05/2012  . Borderline intellectual functioning 02/15/2012  . Osteoporosis 11/21/2011  . Allergic rhinitis 11/08/2010  . Insomnia 11/08/2010  . GERD (gastroesophageal reflux disease) 09/27/2010    9:43 AM,04/07/19 Sherol Dade PT, DPT [facility] [address], [place], [place], [address] Phone: [phone]   Fax:  [phone]  Name: Kellie [name] MRN: [id number] Date of Birth: [birth year]

## 2019-04-07 NOTE — Patient Instructions (Signed)
Access Code: [email or web address]  URL: [email or web address]  Date: 04/07/2019  Prepared by: Sherol Dade   Exercises  Dead Bug - 10 reps - 2x daily - 7x weekly  Side Plank on Knees - 3-5 sets - 10-30 hold - 2x daily - 7x weekly  Shoulder extension with resistance - Neutral - 10 reps - 2 sets - 2x daily - 7x weekly  Supine Bridge - 10-15 reps - 2 sets - 2x daily - 7x weekly  Sidelying Hip Abduction - 10 reps - 2 sets - 2x daily - 7x weekly    [facility] [address] [place], [place] [address] Phone # [phone] Fax [phone]

## 2019-04-08 ENCOUNTER — Ambulatory Visit: Payer: Medicare HMO

## 2019-04-08 ENCOUNTER — Other Ambulatory Visit: Payer: Medicare HMO

## 2019-04-14 ENCOUNTER — Encounter: Payer: Self-pay | Admitting: Physical Therapy

## 2019-04-14 ENCOUNTER — Telehealth: Payer: Self-pay

## 2019-04-14 ENCOUNTER — Other Ambulatory Visit: Payer: Self-pay

## 2019-04-14 ENCOUNTER — Ambulatory Visit: Payer: Medicare HMO | Admitting: Physical Therapy

## 2019-04-14 DIAGNOSIS — R293 Abnormal posture: Secondary | ICD-10-CM | POA: Diagnosis not present

## 2019-04-14 DIAGNOSIS — G8929 Other chronic pain: Secondary | ICD-10-CM

## 2019-04-14 DIAGNOSIS — M545 Low back pain: Secondary | ICD-10-CM | POA: Diagnosis not present

## 2019-04-14 DIAGNOSIS — M6281 Muscle weakness (generalized): Secondary | ICD-10-CM

## 2019-04-14 NOTE — Telephone Encounter (Signed)
Left voicemail for patient to call us back if she still wants to schedule this appointment.

## 2019-04-14 NOTE — Therapy (Signed)
[facility] [address] [place], [place], [address] Phone: [phone]   Fax:  [phone]  Physical Therapy Treatment/Re-evaluation   Patient Details  Name: Kellie [name] MRN: [id number] Date of Birth: December 12, 1955 Referring Provider (PT): [name] PA-C   Encounter Date: 04/14/2019  PT End of Session - 04/14/19 0825    Visit Number  3    Number of Visits  4    Date for PT Re-Evaluation  05/22/19    Authorization Time Period  04/21/19 to 05/22/19    PT Start Time  0800    PT Stop Time  0840    PT Time Calculation (min)  40 min    Activity Tolerance  Patient tolerated treatment well;No increased pain    Behavior During Therapy  WFL for tasks assessed/performed       Past Medical History:  Diagnosis Date  . Abnormal Pap smear of cervix   . Allergic rhinitis 11/08/2010  . Anxiety   . Asthma   . Bronchitis   . Herpes simplex of female genitalia   . Hyperlipidemia   . Mitral valve prolapse   . Osteoporosis 11/21/2011  . Urticaria     Past Surgical History:  Procedure Laterality Date  . CLOSED MANIPULATION SHOULDER  7&01/2005   x2, 30 days apart  . CLOSED MANIPULATION SHOULDER  2006   Left  . COLONOSCOPY  09/2012   Medium sized external hemorrhoids, few small divertic in left colon, otherwise normal colon (repeat 10 yrs)  . ESOPHAGOGASTRODUODENOSCOPY  09/2012   Normal (Dr. Ardis Hughs)  . LEEP  11/2004    There were no vitals filed for this visit.  Subjective Assessment - 04/14/19 0803    Subjective  Pt states that things are going well. Her back hurts her some but she felt good after her last session.    Diagnostic tests  bone density    Currently in Pain?  Yes    Pain Score  8     Pain Location  Back    Pain Orientation  Right;Left    Pain Descriptors / Indicators  Aching;Dull    Pain Type  Chronic pain    Pain Radiating Towards  none    Pain Onset  More than a month ago    Pain Frequency   Intermittent    Aggravating Factors   bending, sitting, etc.    Pain Onset  More than a month ago         [facility] PT Assessment - 04/14/19 0001      Assessment   Medical Diagnosis  Osteoporosis    Referring Provider (PT)  [name] PA-C    Onset Date/Surgical Date  02/25/19   after most recent bone density scn   Hand Dominance  Right    Next MD Visit  PRN    Prior Therapy  yes      Precautions   Precautions  Other (comment)    Precaution Comments  Osteoporosis - no trunk flexion      Balance Screen   Has the patient fallen in the past 6 months  No    Has the patient had a decrease in activity level because of a fear of falling?   No    Is the patient reluctant to leave their home because of a fear of falling?   No      Prior Function   Level of Independence  Independent    Vocation  On disability  Leisure  attend church, and exercise socialize as needed      Posture/Postural Control   Posture/Postural Control  --   small framed with decreased spinal curves     AROM   Overall AROM Comments  bilat UE's WNL       Strength   Overall Strength Comments  hip abduction and extension 3+/5 MMT     Lumbar Flexion  --   TA good (-)    Lumbar Extension  --   multifidi good     Flexibility   Soft Tissue Assessment /Muscle Length  --   WNL                  [facility] Adult PT Treatment/Exercise - 04/14/19 0001      Self-Care   Self-Care  Posture;Lifting    Lifting  lifting mechanics while seated and standing, therapist demo     Posture  avoiding flexion, anatomy review of the spine and importance of avoiding excessive flexion      Lumbar Exercises: Machines for Strengthening   Leg Press  seat 6, BLE #70 x20 reps; BLE #90 2x12 reps       Lumbar Exercises: Standing   Other Standing Lumbar Exercises  3 way hip slider with red TB x10 reps       Lumbar Exercises: Sidelying   Other Sidelying Lumbar Exercises  side plank lift on knees/elbow with clamshell x10  reps each side              PT Education - 04/14/19 0843    Education Details  technique with therex; safe mechanics with lifting/donning shoes    Person(s) Educated  Patient    Methods  Explanation;Demonstration    Comprehension  Verbalized understanding;Returned demonstration;Need further instruction          PT Long Term Goals - 04/14/19 0815      PT LONG TERM GOAL #1   Title  Verbally report contraindicated movement patterns with dx of osteoporsis    Time  4    Period  Weeks    Status  Achieved    Target Date  05/22/19      PT LONG TERM GOAL #2   Title  I with HEP to promote weightbearing exercise for bone growth to prevent risk of fx    Baseline  Pt HEP just estabilished but not been able to progress due to expected delays in strength progression    Time  4    Period  Weeks    Status  On-going      PT LONG TERM GOAL #3   Title  improve bilat hip abduction strength -/> 5-/5 to allow functional movement exercises ( side lunges/squats/walking program)    Baseline  3+/5 MMT    Time  4    Period  Weeks    Status  On-going      PT LONG TERM GOAL #4   Title  report =/> 50% reduction in hip pain to allow her to perform light dancing at home ( 04/20/2019)    Time  4    Period  Weeks    Status  On-going      PT LONG TERM GOAL #5   Title  demonstrate ability to maintain upright posture throughout full treatment session due to improved strength and body awareness    Time  4    Period  Weeks    Status  New      Additional Long Term Goals  Additional Long Term Goals  Yes      PT LONG TERM GOAL #6   Title  Pt will be able to lift #20 box from the floor x5 trials with proper mechanics to decrease her risk of injury in the future.    Time  4    Period  Weeks    Status  New            Plan - 04/14/19 0844    Clinical Impression Statement  Pt is making steady progress towards her goals in the recent weeks. She has been provided an initial HEP that she is  completing without difficulty. In addition, she can verbalize safe mechanics and osteoporosis management this visit. Pt does have difficulty demonstrating proper mechanics by avoiding trunk flexion when lifting cases of water, etc. Therapist reviewed this today and will continue to provide education on this moving forward. Pt does continue to have limitations in LE strength and trunk strength evident by her fatigue during planking and with MMT. She would continue to benefit from skilled PT 2x/week for 4 more week to ensure that she has adequate strength and endurance to maintain safe mechanics with her daily activity and avoid further injury.    Personal Factors and Comorbidities  Age;Behavior Pattern;Comorbidity 3+;Past/Current Experience    Comorbidities  bipolar, anxiety, asthma, DDD lumbar spine, h/o frozen shoulder, post menopasal    Examination-Activity Limitations  Other    Examination-Participation Restrictions  Other;Community Activity    Stability/Clinical Decision Making  Stable/Uncomplicated    Rehab Potential  Fair    PT Frequency  2x / week    PT Duration  4 weeks    PT Treatment/Interventions  Functional mobility training;Patient/family education;Therapeutic activities;Manual techniques;Therapeutic exercise;Aquatic Therapy;Neuromuscular re-education    PT Next Visit Plan  progress LE strength; seated hip hinge/kneeling abdominal strength; leg press increase resistance next visit    Consulted and Agree with Plan of Care  Patient       Patient will benefit from skilled therapeutic intervention in order to improve the following deficits and impairments:  Pain, Decreased knowledge of precautions  Visit Diagnosis: Muscle weakness (generalized)  Abnormal posture  Chronic bilateral low back pain without sciatica     Problem List Patient Active Problem List   Diagnosis Date Noted  . Plantar fasciitis, right 04/02/2019  . Vitamin D insufficiency 02/16/2019  . Cervical stenosis  (uterine cervix) 02/13/2019  . Chronic pain of both ankles 01/15/2019  . History of diverticulitis 09/16/2018  . Vaginal dryness 09/02/2018  . Recent skin changes 03/13/2018  . Periodic limb movement 03/13/2018  . Nocturnal leg cramps 03/13/2018  . Aortic insufficiency 01/10/2018  . Non-restorative sleep 01/10/2018  . Snoring 01/10/2018  . Atypical chest pain 10/22/2017  . Anxiety 10/22/2017  . History of loop electrical excision procedure (LEEP) 07/01/2017  . Unintentional weight loss 05/18/2017  . Elevated LDL cholesterol level 05/18/2017  . Hyperlipidemia 05/18/2017  . Irritable bowel syndrome with both constipation and diarrhea 05/18/2017  . Family history of pancreatic cancer 02/18/2017  . Diarrhea 02/18/2017  . Loss of weight 02/18/2017  . Weakness 10/05/2016  . Panic attacks 09/18/2016  . SOB (shortness of breath) 09/18/2016  . Bipolar 1 disorder with moderate mania ([place]) 09/17/2016  . Pulmonary nodule 08/08/2016  . Hoarseness 03/09/2016  . Left knee pain 11/09/2015  . Chronic pain of both knees 11/09/2015  . Plantar fasciitis, bilateral 11/09/2015  . Cough, persistent 07/21/2015  . Post-nasal drip 07/21/2015  . DDD (degenerative disc  disease), cervical 07/05/2015  . Chronic nasal congestion 03/01/2015  . Vitreous floaters of right eye 03/01/2015  . Near syncope 02/09/2015  . Rhinitis, allergic 10/14/2014  . Family history of renal cancer 10/11/2014  . DDD (degenerative disc disease), lumbar 10/11/2014  . Bipolar disorder with depression ([place]) 06/02/2014  . Postmenopausal atrophic vaginitis 03/17/2014  . Prediabetes 12/14/2013  . Encounter for gynecological examination with Papanicolaou smear of cervix 12/08/2013  . Adhesive capsulitis of right shoulder 11/24/2013  . Chronic periodontal disease 06/30/2013  . Genital herpes 11/05/2012  . Hemorrhoids 11/05/2012  . Borderline intellectual functioning 02/15/2012  . Osteoporosis 11/21/2011  . Allergic rhinitis  11/08/2010  . Insomnia 11/08/2010  . GERD (gastroesophageal reflux disease) 09/27/2010     10:00 AM,04/14/19 Sherol Dade PT, DPT [place] [facility] [address], [place], [place], [address] Phone: [phone]   Fax:  [phone]  Name: Kellie [name] MRN: [id number] Date of Birth: 1955/08/27

## 2019-04-14 NOTE — Telephone Encounter (Signed)
Kellie [name] called and left a message requesting a call back. She would like to reschedule the spirometry appointment.

## 2019-04-17 ENCOUNTER — Ambulatory Visit (INDEPENDENT_AMBULATORY_CARE_PROVIDER_SITE_OTHER): Payer: Medicare HMO | Admitting: Physician Assistant

## 2019-04-17 ENCOUNTER — Other Ambulatory Visit: Payer: Self-pay

## 2019-04-17 VITALS — BP 96/57 | HR 80 | Ht 66.0 in | Wt 111.0 lb

## 2019-04-17 DIAGNOSIS — R0989 Other specified symptoms and signs involving the circulatory and respiratory systems: Secondary | ICD-10-CM

## 2019-04-17 DIAGNOSIS — R0602 Shortness of breath: Secondary | ICD-10-CM

## 2019-04-17 DIAGNOSIS — J989 Respiratory disorder, unspecified: Secondary | ICD-10-CM | POA: Insufficient documentation

## 2019-04-17 DIAGNOSIS — M722 Plantar fascial fibromatosis: Secondary | ICD-10-CM

## 2019-04-17 DIAGNOSIS — J4521 Mild intermittent asthma with (acute) exacerbation: Secondary | ICD-10-CM | POA: Diagnosis not present

## 2019-04-17 DIAGNOSIS — R05 Cough: Secondary | ICD-10-CM | POA: Diagnosis not present

## 2019-04-17 DIAGNOSIS — R059 Cough, unspecified: Secondary | ICD-10-CM

## 2019-04-17 MED ORDER — MONTELUKAST SODIUM 10 MG PO TABS: 10.0000 mg | ORAL_TABLET | Freq: Every day | ORAL | 3 refills | Status: DC

## 2019-04-17 MED ORDER — ALBUTEROL SULFATE HFA 108 (90 BASE) MCG/ACT IN AERS
2.0000 | INHALATION_SPRAY | Freq: Once | RESPIRATORY_TRACT | Status: AC
  Administered 2019-04-17: 2 via RESPIRATORY_TRACT

## 2019-04-17 NOTE — Progress Notes (Signed)
Subjective:     Patient ID: Kellie [name], female   DOB: February 09, 1956, 63 y.o.   MRN: [id number]  HPI  Pt is a 63 female with intermittent cough and SOB who presents to the clinic for spirometry.   Pt needs another referral for podiatry for her plantar fasciitis.   .. Active Ambulatory Problems    Diagnosis Date Noted  . GERD (gastroesophageal reflux disease) 09/27/2010  . Allergic rhinitis 11/08/2010  . Insomnia 11/08/2010  . Cough 06/02/2011  . Osteoporosis 11/21/2011  . Borderline intellectual functioning 02/15/2012  . Genital herpes 11/05/2012  . Hemorrhoids 11/05/2012  . Chronic periodontal disease 06/30/2013  . Adhesive capsulitis of right shoulder 11/24/2013  . Encounter for gynecological examination with Papanicolaou smear of cervix 12/08/2013  . Prediabetes 12/14/2013  . Postmenopausal atrophic vaginitis 03/17/2014  . Bipolar disorder with depression ([place]) 06/02/2014  . Family history of renal cancer 10/11/2014  . DDD (degenerative disc disease), lumbar 10/11/2014  . Rhinitis, allergic 10/14/2014  . Near syncope 02/09/2015  . Chronic nasal congestion 03/01/2015  . Vitreous floaters of right eye 03/01/2015  . DDD (degenerative disc disease), cervical 07/05/2015  . Cough, persistent 07/21/2015  . Post-nasal drip 07/21/2015  . Left knee pain 11/09/2015  . Chronic pain of both knees 11/09/2015  . Plantar fasciitis, bilateral 11/09/2015  . Hoarseness 03/09/2016  . Pulmonary nodule 08/08/2016  . Bipolar 1 disorder with moderate mania ([place]) 09/17/2016  . Panic attacks 09/18/2016  . SOB (shortness of breath) 09/18/2016  . Weakness 10/05/2016  . Family history of pancreatic cancer 02/18/2017  . Diarrhea 02/18/2017  . Loss of weight 02/18/2017  . Unintentional weight loss 05/18/2017  . Elevated LDL cholesterol level 05/18/2017  . Hyperlipidemia 05/18/2017  . Irritable bowel syndrome with both constipation and diarrhea 05/18/2017  . History of loop electrical excision  procedure (LEEP) 07/01/2017  . Atypical chest pain 10/22/2017  . Anxiety 10/22/2017  . Aortic insufficiency 01/10/2018  . Non-restorative sleep 01/10/2018  . Snoring 01/10/2018  . Recent skin changes 03/13/2018  . Periodic limb movement 03/13/2018  . Nocturnal leg cramps 03/13/2018  . Vaginal dryness 09/02/2018  . History of diverticulitis 09/16/2018  . Chronic pain of both ankles 01/15/2019  . Cervical stenosis (uterine cervix) 02/13/2019  . Vitamin D insufficiency 02/16/2019  . Plantar fasciitis, right 04/02/2019  . Reactive airway disease that is not asthma 04/17/2019   Resolved Ambulatory Problems    Diagnosis Date Noted  . Sinusitis 09/27/2010  . Depression 09/27/2010  . Bronchitis 12/04/2010  . General medical examination 10/18/2011  . Dizziness 11/21/2011  . Knee pain 11/21/2011  . Low back pain 11/21/2011  . Heel pain 07/14/2012  . Thoracic myofascial strain 03/13/2013  . Sinusitis 04/20/2013  . Cough 04/22/2013  . Rotator cuff disorder 08/01/2013  . Right shoulder pain 10/27/2013  . Adhesive capsulitis 11/02/2013  . Pain in joint, shoulder region 11/24/2013  . Bipolar disorder ([place]) 04/29/2014  . Right serous otitis media 08/03/2014  . Acute recurrent maxillary sinusitis 10/14/2014  . Right-sided low back pain without sciatica 10/14/2014  . Sinusitis 02/09/2015  . Sacroiliac joint dysfunction of right side 02/25/2015  . Piriformis syndrome 03/01/2015  . Adhesive capsulitis 07/05/2015  . Radiculitis of right cervical region 07/06/2015  . Drug reaction 09/25/2016  . Mitral valve prolapse 01/10/2018   Past Medical History:  Diagnosis Date  . Abnormal Pap smear of cervix   . Allergic rhinitis 11/08/2010  . Asthma   . Herpes simplex of female genitalia   .  Urticaria      Review of Systems See HPI.     Objective:   Physical Exam Vitals signs reviewed.  Constitutional:      Appearance: She is well-developed.  HENT:     Head: Normocephalic and  atraumatic.  Pulmonary:     Effort: Pulmonary effort is normal.  Neurological:     General: No focal deficit present.     Mental Status: She is alert.  Psychiatric:        Mood and Affect: Mood normal.        Assessment:     .Kellie [name] was seen today for shortness of breath.  Diagnoses and all orders for this visit:  Reactive airway disease that is not asthma -     montelukast (SINGULAIR) 10 MG tablet; Take 1 tablet (10 mg total) by mouth at bedtime. -     PR EVAL OF BRONCHOSPASM -     albuterol (VENTOLIN HFA) 108 (90 Base) MCG/ACT inhaler 2 puff  SOB (shortness of breath) -     montelukast (SINGULAIR) 10 MG tablet; Take 1 tablet (10 mg total) by mouth at bedtime.  Cough -     montelukast (SINGULAIR) 10 MG tablet; Take 1 tablet (10 mg total) by mouth at bedtime.  Plantar fasciitis, bilateral -     Ambulatory referral to Podiatry       Plan:      Normal spirometry  FVC is 89 percent FEV1 is 91 percent FEV1/FVC is 77. Percent FEF 25-75 is 92 precent with 15 percent change  Added singulair. Use albuterol when needed.   Referral placed for podiatry.

## 2019-04-19 ENCOUNTER — Encounter: Payer: Self-pay | Admitting: Physician Assistant

## 2019-04-21 ENCOUNTER — Encounter: Payer: Medicare HMO | Admitting: Physical Therapy

## 2019-04-23 ENCOUNTER — Encounter: Payer: Medicare HMO | Admitting: Physical Therapy

## 2019-04-28 ENCOUNTER — Other Ambulatory Visit: Payer: Self-pay

## 2019-04-28 ENCOUNTER — Encounter: Payer: Self-pay | Admitting: Physical Therapy

## 2019-04-28 ENCOUNTER — Other Ambulatory Visit: Payer: Self-pay | Admitting: Physician Assistant

## 2019-04-28 ENCOUNTER — Ambulatory Visit: Payer: Medicare HMO | Attending: Physician Assistant | Admitting: Physical Therapy

## 2019-04-28 DIAGNOSIS — M6281 Muscle weakness (generalized): Secondary | ICD-10-CM | POA: Diagnosis not present

## 2019-04-28 DIAGNOSIS — F410 Panic disorder [episodic paroxysmal anxiety] without agoraphobia: Secondary | ICD-10-CM

## 2019-04-28 DIAGNOSIS — R293 Abnormal posture: Secondary | ICD-10-CM | POA: Diagnosis not present

## 2019-04-28 DIAGNOSIS — F419 Anxiety disorder, unspecified: Secondary | ICD-10-CM

## 2019-04-28 DIAGNOSIS — M545 Low back pain: Secondary | ICD-10-CM | POA: Insufficient documentation

## 2019-04-28 DIAGNOSIS — G8929 Other chronic pain: Secondary | ICD-10-CM | POA: Diagnosis not present

## 2019-04-28 NOTE — Therapy (Addendum)
[facility] [address], [place], [place], [address] Phone: [phone]   Fax:  [phone]  Physical Therapy Treatment/Discharge  Patient Details  Name: Kellie [name] MRN: [id number] Date of Birth: 14-Jul-1955 Referring Provider (PT): [name] PA-C   Encounter Date: 04/28/2019  PT End of Session - 04/28/19 1012    Visit Number  4    Date for PT Re-Evaluation  05/22/19    Authorization Time Period  04/21/19 to 05/22/19    PT Start Time  0848    PT Stop Time  0927    PT Time Calculation (min)  39 min    Activity Tolerance  Patient tolerated treatment well;No increased pain    Behavior During Therapy  WFL for tasks assessed/performed       Past Medical History:  Diagnosis Date  . Abnormal Pap smear of cervix   . Allergic rhinitis 11/08/2010  . Anxiety   . Asthma   . Bronchitis   . Herpes simplex of female genitalia   . Hyperlipidemia   . Mitral valve prolapse   . Osteoporosis 11/21/2011  . Urticaria     Past Surgical History:  Procedure Laterality Date  . CLOSED MANIPULATION SHOULDER  7&01/2005   x2, 30 days apart  . CLOSED MANIPULATION SHOULDER  2006   Left  . COLONOSCOPY  09/2012   Medium sized external hemorrhoids, few small divertic in left colon, otherwise normal colon (repeat 10 yrs)  . ESOPHAGOGASTRODUODENOSCOPY  09/2012   Normal (Dr. Ardis Hughs)  . LEEP  11/2004    There were no vitals filed for this visit.  Subjective Assessment - 04/28/19 0851    Subjective  Pt states that her low back is bothering her more towards the sacrum. No other issues with her HEP.    Diagnostic tests  bone density    Currently in Pain?  Other (Comment)    Pain Onset  More than a month ago    Pain Onset  More than a month ago                       [facility] Adult PT Treatment/Exercise - 04/28/19 0001      Lumbar Exercises: Machines for Strengthening   Leg Press  seat 6 incline 3, BLE 3x12 reps        Lumbar Exercises: Standing   Other Standing Lumbar Exercises  BUE pressdown with green TB x15 reps, PT cuing to prevent forward shoulder compensation      Lumbar Exercises: Seated   Other Seated Lumbar Exercises  hip hinge with UE support on green physioball x15 reps (tactile/verbal cuing for technique)       Lumbar Exercises: Supine   Dead Bug  10 reps    Dead Bug Limitations  with UE/LE press into red physioball       Lumbar Exercises: Quadruped   Straight Leg Raise  10 reps             PT Education - 04/28/19 1017    Education Details  technique with therex    Person(s) Educated  Patient    Methods  Explanation;Verbal cues    Comprehension  Verbalized understanding;Returned demonstration          PT Long Term Goals - 04/14/19 0815      PT LONG TERM GOAL #1   Title  Verbally report contraindicated movement patterns with dx of osteoporsis    Time  4  Period  Weeks    Status  Achieved    Target Date  05/22/19      PT LONG TERM GOAL #2   Title  I with HEP to promote weightbearing exercise for bone growth to prevent risk of fx    Baseline  Pt HEP just estabilished but not been able to progress due to expected delays in strength progression    Time  4    Period  Weeks    Status  On-going      PT LONG TERM GOAL #3   Title  improve bilat hip abduction strength -/> 5-/5 to allow functional movement exercises ( side lunges/squats/walking program)    Baseline  3+/5 MMT    Time  4    Period  Weeks    Status  On-going      PT LONG TERM GOAL #4   Title  report =/> 50% reduction in hip pain to allow her to perform light dancing at home ( 04/20/2019)    Time  4    Period  Weeks    Status  On-going      PT LONG TERM GOAL #5   Title  demonstrate ability to maintain upright posture throughout full treatment session due to improved strength and body awareness    Time  4    Period  Weeks    Status  New      Additional Long Term Goals   Additional Long Term Goals   Yes      PT LONG TERM GOAL #6   Title  Pt will be able to lift #20 box from the floor x5 trials with proper mechanics to decrease her risk of injury in the future.    Time  4    Period  Weeks    Status  New            Plan - 04/28/19 1008    Clinical Impression Statement  Pt continues to report constant dull ache in her low back/sacral region, but she has been able to adhere to her HEP without any issues. Session focused on improving lumbo-sacral control. Pt required therapist verbal and tactile cuing with seated hip hinge due to her tendency for low trunk flexion. Pt was able to complete quadruped therex with proper trunk stability and she noted no increase in her pain end of today's session. Will continue with current POC.    Personal Factors and Comorbidities  Age;Behavior Pattern;Comorbidity 3+;Past/Current Experience    Comorbidities  bipolar, anxiety, asthma, DDD lumbar spine, h/o frozen shoulder, post menopasal    Examination-Activity Limitations  Other    Examination-Participation Restrictions  Other;Community Activity    Stability/Clinical Decision Making  Stable/Uncomplicated    Rehab Potential  Fair    PT Frequency  2x / week    PT Duration  4 weeks    PT Treatment/Interventions  Functional mobility training;Patient/family education;Therapeutic activities;Manual techniques;Therapeutic exercise;Aquatic Therapy;Neuromuscular re-education    PT Next Visit Plan  progress LE strength; seated hip hinge/kneeling abdominal strength; leg press increase resistance next visit    Consulted and Agree with Plan of Care  Patient       Patient will benefit from skilled therapeutic intervention in order to improve the following deficits and impairments:  Pain, Decreased knowledge of precautions  Visit Diagnosis: Muscle weakness (generalized)  Abnormal posture  Chronic bilateral low back pain without sciatica     Problem List Patient Active Problem List   Diagnosis Date Noted   .  Reactive airway disease that is not asthma 04/17/2019  . Plantar fasciitis, right 04/02/2019  . Vitamin D insufficiency 02/16/2019  . Cervical stenosis (uterine cervix) 02/13/2019  . Chronic pain of both ankles 01/15/2019  . History of diverticulitis 09/16/2018  . Vaginal dryness 09/02/2018  . Recent skin changes 03/13/2018  . Periodic limb movement 03/13/2018  . Nocturnal leg cramps 03/13/2018  . Aortic insufficiency 01/10/2018  . Non-restorative sleep 01/10/2018  . Snoring 01/10/2018  . Atypical chest pain 10/22/2017  . Anxiety 10/22/2017  . History of loop electrical excision procedure (LEEP) 07/01/2017  . Unintentional weight loss 05/18/2017  . Elevated LDL cholesterol level 05/18/2017  . Hyperlipidemia 05/18/2017  . Irritable bowel syndrome with both constipation and diarrhea 05/18/2017  . Family history of pancreatic cancer 02/18/2017  . Diarrhea 02/18/2017  . Loss of weight 02/18/2017  . Weakness 10/05/2016  . Panic attacks 09/18/2016  . SOB (shortness of breath) 09/18/2016  . Bipolar 1 disorder with moderate mania ([place]) 09/17/2016  . Pulmonary nodule 08/08/2016  . Hoarseness 03/09/2016  . Left knee pain 11/09/2015  . Chronic pain of both knees 11/09/2015  . Plantar fasciitis, bilateral 11/09/2015  . Cough, persistent 07/21/2015  . Post-nasal drip 07/21/2015  . DDD (degenerative disc disease), cervical 07/05/2015  . Chronic nasal congestion 03/01/2015  . Vitreous floaters of right eye 03/01/2015  . Near syncope 02/09/2015  . Rhinitis, allergic 10/14/2014  . Family history of renal cancer 10/11/2014  . DDD (degenerative disc disease), lumbar 10/11/2014  . Bipolar disorder with depression ([place]) 06/02/2014  . Postmenopausal atrophic vaginitis 03/17/2014  . Prediabetes 12/14/2013  . Encounter for gynecological examination with Papanicolaou smear of cervix 12/08/2013  . Adhesive capsulitis of right shoulder 11/24/2013  . Chronic periodontal disease 06/30/2013  .  Genital herpes 11/05/2012  . Hemorrhoids 11/05/2012  . Borderline intellectual functioning 02/15/2012  . Osteoporosis 11/21/2011  . Cough 06/02/2011  . Allergic rhinitis 11/08/2010  . Insomnia 11/08/2010  . GERD (gastroesophageal reflux disease) 09/27/2010    10:18 AM,04/28/19 Sherol Dade PT, DPT [place] [facility] [address], [place], [place], [address] Phone: [phone]   Fax:  [phone]  Name: Kellie [name] MRN: [id number] Date of Birth: 1955-11-01   *addendum to resolve episode of care and d/c pt from [facility]  Visits from Start of Care: 4  Current functional level related to goals / functional outcomes: See above for more details    Remaining deficits: See above for more details    Education / Equipment: See above for more details   Plan: Patient agrees to discharge.  Patient goals were not met. Patient is being discharged due to the patient's request.  ?????         Pt has a lot of family and personal issues going on and would like to discharge and return when able.   9:20 AM,05/05/19 Sherol Dade PT, [place] at [facility]

## 2019-04-30 ENCOUNTER — Encounter: Payer: Medicare HMO | Admitting: Physical Therapy

## 2019-05-04 DIAGNOSIS — M65872 Other synovitis and tenosynovitis, left ankle and foot: Secondary | ICD-10-CM | POA: Diagnosis not present

## 2019-05-04 DIAGNOSIS — M7731 Calcaneal spur, right foot: Secondary | ICD-10-CM | POA: Diagnosis not present

## 2019-05-04 DIAGNOSIS — M7732 Calcaneal spur, left foot: Secondary | ICD-10-CM | POA: Diagnosis not present

## 2019-05-04 DIAGNOSIS — B351 Tinea unguium: Secondary | ICD-10-CM | POA: Diagnosis not present

## 2019-05-04 DIAGNOSIS — M65871 Other synovitis and tenosynovitis, right ankle and foot: Secondary | ICD-10-CM | POA: Diagnosis not present

## 2019-05-04 DIAGNOSIS — M722 Plantar fascial fibromatosis: Secondary | ICD-10-CM | POA: Diagnosis not present

## 2019-05-05 ENCOUNTER — Encounter: Payer: Medicare HMO | Admitting: Physical Therapy

## 2019-05-06 ENCOUNTER — Encounter: Payer: Medicare HMO | Admitting: Physical Therapy

## 2019-05-07 ENCOUNTER — Other Ambulatory Visit: Payer: Self-pay

## 2019-05-07 ENCOUNTER — Ambulatory Visit (INDEPENDENT_AMBULATORY_CARE_PROVIDER_SITE_OTHER): Payer: Medicare HMO

## 2019-05-07 DIAGNOSIS — Z1231 Encounter for screening mammogram for malignant neoplasm of breast: Secondary | ICD-10-CM | POA: Diagnosis not present

## 2019-05-08 NOTE — Progress Notes (Signed)
Normal mammogram. Follow up in 1 year.

## 2019-05-12 ENCOUNTER — Encounter: Payer: Medicare HMO | Admitting: Physical Therapy

## 2019-05-14 ENCOUNTER — Encounter: Payer: Medicare HMO | Admitting: Physical Therapy

## 2019-05-15 DIAGNOSIS — H0288B Meibomian gland dysfunction left eye, upper and lower eyelids: Secondary | ICD-10-CM | POA: Diagnosis not present

## 2019-05-15 DIAGNOSIS — H04123 Dry eye syndrome of bilateral lacrimal glands: Secondary | ICD-10-CM | POA: Diagnosis not present

## 2019-05-15 DIAGNOSIS — H0288A Meibomian gland dysfunction right eye, upper and lower eyelids: Secondary | ICD-10-CM | POA: Diagnosis not present

## 2019-05-19 ENCOUNTER — Ambulatory Visit: Payer: Medicare HMO | Admitting: Physical Therapy

## 2019-05-28 ENCOUNTER — Other Ambulatory Visit (HOSPITAL_COMMUNITY): Payer: Self-pay | Admitting: Psychiatry

## 2019-06-29 ENCOUNTER — Other Ambulatory Visit: Payer: Self-pay | Admitting: Physician Assistant

## 2019-06-29 DIAGNOSIS — R0602 Shortness of breath: Secondary | ICD-10-CM

## 2019-07-20 ENCOUNTER — Ambulatory Visit (INDEPENDENT_AMBULATORY_CARE_PROVIDER_SITE_OTHER): Payer: Medicare HMO | Admitting: Nurse Practitioner

## 2019-07-20 ENCOUNTER — Encounter: Payer: Self-pay | Admitting: Nurse Practitioner

## 2019-07-20 ENCOUNTER — Other Ambulatory Visit: Payer: Self-pay

## 2019-07-20 DIAGNOSIS — G44319 Acute post-traumatic headache, not intractable: Secondary | ICD-10-CM | POA: Diagnosis not present

## 2019-07-20 DIAGNOSIS — M5136 Other intervertebral disc degeneration, lumbar region: Secondary | ICD-10-CM | POA: Diagnosis not present

## 2019-07-20 DIAGNOSIS — M5030 Other cervical disc degeneration, unspecified cervical region: Secondary | ICD-10-CM

## 2019-07-20 MED ORDER — MELOXICAM 15 MG PO TABS: ORAL_TABLET | ORAL | 5 refills | Status: DC

## 2019-07-20 MED ORDER — CYCLOBENZAPRINE HCL 10 MG PO TABS: ORAL_TABLET | ORAL | 0 refills | Status: DC
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
## 2019-07-22 ENCOUNTER — Other Ambulatory Visit: Payer: Self-pay | Admitting: Nurse Practitioner

## 2019-07-22 ENCOUNTER — Other Ambulatory Visit: Payer: Self-pay

## 2019-07-22 ENCOUNTER — Ambulatory Visit (INDEPENDENT_AMBULATORY_CARE_PROVIDER_SITE_OTHER): Payer: Medicare HMO

## 2019-07-22 DIAGNOSIS — M48061 Spinal stenosis, lumbar region without neurogenic claudication: Secondary | ICD-10-CM | POA: Diagnosis not present

## 2019-07-22 DIAGNOSIS — S3992XA Unspecified injury of lower back, initial encounter: Secondary | ICD-10-CM | POA: Diagnosis not present

## 2019-07-22 DIAGNOSIS — S199XXA Unspecified injury of neck, initial encounter: Secondary | ICD-10-CM | POA: Diagnosis not present

## 2019-07-22 DIAGNOSIS — M546 Pain in thoracic spine: Secondary | ICD-10-CM | POA: Diagnosis not present

## 2019-07-22 DIAGNOSIS — M4312 Spondylolisthesis, cervical region: Secondary | ICD-10-CM | POA: Diagnosis not present

## 2019-07-22 DIAGNOSIS — S299XXA Unspecified injury of thorax, initial encounter: Secondary | ICD-10-CM | POA: Diagnosis not present
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
## 2019-07-23 ENCOUNTER — Ambulatory Visit (INDEPENDENT_AMBULATORY_CARE_PROVIDER_SITE_OTHER): Payer: Medicare HMO

## 2019-07-23 ENCOUNTER — Other Ambulatory Visit: Payer: Self-pay | Admitting: Nurse Practitioner

## 2019-07-23 DIAGNOSIS — G44319 Acute post-traumatic headache, not intractable: Secondary | ICD-10-CM

## 2019-07-23 DIAGNOSIS — M4310 Spondylolisthesis, site unspecified: Secondary | ICD-10-CM

## 2019-07-23 DIAGNOSIS — S199XXA Unspecified injury of neck, initial encounter: Secondary | ICD-10-CM | POA: Diagnosis not present

## 2019-07-23 DIAGNOSIS — S0990XA Unspecified injury of head, initial encounter: Secondary | ICD-10-CM | POA: Diagnosis not present

## 2019-07-23 NOTE — Progress Notes (Signed)
X-ray of the c-spine reveals 29mm anterolisthesis of C4 on C5 vertebral body with endplate sclerosis at D34-534 with moderate severity intervertebral disc space narrowing.  Lumbar x-ray reveals mild stable degenerative changes at L4 and L5.  I feel the patient would benefit from physical therapy to help with the mild misalignment and pain she is experiencing.  Patient advised to continue meloxicam as needed.  Order for PT sent.

## 2019-07-28 ENCOUNTER — Encounter: Payer: Self-pay | Admitting: Physician Assistant

## 2019-07-28 DIAGNOSIS — M5030 Other cervical disc degeneration, unspecified cervical region: Secondary | ICD-10-CM

## 2019-07-28 DIAGNOSIS — M4310 Spondylolisthesis, site unspecified: Secondary | ICD-10-CM | POA: Insufficient documentation

## 2019-07-29 ENCOUNTER — Other Ambulatory Visit: Payer: Self-pay

## 2019-07-29 ENCOUNTER — Ambulatory Visit (INDEPENDENT_AMBULATORY_CARE_PROVIDER_SITE_OTHER): Payer: Medicare HMO | Admitting: Psychiatry

## 2019-07-29 DIAGNOSIS — F411 Generalized anxiety disorder: Secondary | ICD-10-CM

## 2019-07-29 DIAGNOSIS — F310 Bipolar disorder, current episode hypomanic: Secondary | ICD-10-CM

## 2019-07-29 DIAGNOSIS — F5101 Primary insomnia: Secondary | ICD-10-CM

## 2019-07-29 DIAGNOSIS — F5102 Adjustment insomnia: Secondary | ICD-10-CM | POA: Diagnosis not present

## 2019-07-29 MED ORDER — GABAPENTIN 100 MG PO CAPS: ORAL_CAPSULE | ORAL | 0 refills | Status: DC

## 2019-07-29 MED ORDER — CITALOPRAM HYDROBROMIDE 10 MG PO TABS: ORAL_TABLET | ORAL | 0 refills | Status: DC
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
## 2019-07-30 ENCOUNTER — Other Ambulatory Visit: Payer: Self-pay

## 2019-07-30 ENCOUNTER — Encounter: Payer: Self-pay | Admitting: Orthopaedic Surgery

## 2019-07-30 ENCOUNTER — Ambulatory Visit (INDEPENDENT_AMBULATORY_CARE_PROVIDER_SITE_OTHER): Payer: Medicare HMO | Admitting: Orthopaedic Surgery

## 2019-07-30 ENCOUNTER — Encounter: Payer: Self-pay | Admitting: Physician Assistant

## 2019-07-30 VITALS — BP 109/69 | HR 76 | Ht 66.0 in | Wt 113.0 lb

## 2019-07-30 DIAGNOSIS — M542 Cervicalgia: Secondary | ICD-10-CM | POA: Diagnosis not present

## 2019-07-30 NOTE — Telephone Encounter (Signed)
I messaged patient back to let her know that the referral was placed for her neck and spine. - CF

## 2019-07-30 NOTE — Telephone Encounter (Signed)
I think she is referring to a referral?

## 2019-07-30 NOTE — Patient Instructions (Signed)
Use Aspercreme, Biofreeze or Voltaren gel over the counter 2-3 times daily make sure you rub it in well each time you use it.
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
## 2019-08-03 ENCOUNTER — Ambulatory Visit (INDEPENDENT_AMBULATORY_CARE_PROVIDER_SITE_OTHER): Payer: Medicare HMO | Admitting: Licensed Clinical Social Worker

## 2019-08-03 DIAGNOSIS — F319 Bipolar disorder, unspecified: Secondary | ICD-10-CM

## 2019-08-03 DIAGNOSIS — F419 Anxiety disorder, unspecified: Secondary | ICD-10-CM
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
## 2019-08-05 ENCOUNTER — Other Ambulatory Visit: Payer: Self-pay

## 2019-08-05 ENCOUNTER — Ambulatory Visit: Payer: Medicare HMO | Attending: Orthopedic Surgery | Admitting: Physical Therapy

## 2019-08-05 ENCOUNTER — Encounter: Payer: Self-pay | Admitting: Physical Therapy

## 2019-08-05 DIAGNOSIS — M6281 Muscle weakness (generalized): Secondary | ICD-10-CM | POA: Diagnosis not present

## 2019-08-05 DIAGNOSIS — M62838 Other muscle spasm: Secondary | ICD-10-CM | POA: Diagnosis not present

## 2019-08-05 DIAGNOSIS — R293 Abnormal posture: Secondary | ICD-10-CM | POA: Diagnosis not present

## 2019-08-05 DIAGNOSIS — M542 Cervicalgia: Secondary | ICD-10-CM | POA: Diagnosis not present

## 2019-08-06 NOTE — Patient Instructions (Signed)
Access Code: [email or web address]  URL: [email or web address]  Date: 08/06/2019  Prepared by: Sherol Dade    Exercises Standing Cervical Flexion AROM- 10 reps- 2x daily- 7x weekly  Supine Chin Tuck- 10 reps- 3 seconds hold- 2x daily- 7x weekly    [facility] [address], [place], [place] [address] Phone # [phone] Fax [phone]
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
## 2019-08-11 ENCOUNTER — Ambulatory Visit: Payer: Medicare HMO | Admitting: Physical Therapy

## 2019-08-11 ENCOUNTER — Encounter: Payer: Self-pay | Admitting: Physical Therapy

## 2019-08-11 ENCOUNTER — Other Ambulatory Visit: Payer: Self-pay

## 2019-08-11 DIAGNOSIS — M542 Cervicalgia: Secondary | ICD-10-CM | POA: Diagnosis not present

## 2019-08-11 DIAGNOSIS — M6281 Muscle weakness (generalized): Secondary | ICD-10-CM

## 2019-08-11 DIAGNOSIS — M62838 Other muscle spasm: Secondary | ICD-10-CM

## 2019-08-11 DIAGNOSIS — R293 Abnormal posture: Secondary | ICD-10-CM | POA: Diagnosis not present

## 2019-08-11 NOTE — Patient Instructions (Signed)
Access Code: [email or web address]  URL: [email or web address]  Date: 08/11/2019  Prepared by: Sherol Dade    Exercises Supine Chin Tuck- 10 reps- 1x daily- 7x weekly  Supine Scapular Retraction- 10 reps- 3 seconds hold- 1x daily- 7x weekly  Mid-Lower Cervical Extension SNAG with Strap- 10 reps- 3 sets- 1x daily- 7x weekly  Doorway Pec Stretch at 60 Elevation- 3 reps- 20 sec hold- 1x daily- 7x weekly    [facility] [address] [place], [place] [address] Phone # [phone] Fax [phone]
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
## 2019-08-12 ENCOUNTER — Telehealth: Payer: Self-pay | Admitting: *Deleted

## 2019-08-12 NOTE — Telephone Encounter (Signed)
Spoke with patient to reschedule appointment due to possibility of inclement weather for Thursday. She just wanted to let you know she has been to therapy twice but is still hurting. Therapy can not see her at all next week due to their schedule being full.  Patient was offered appointment today, however she said Tuesday would work better for her.

## 2019-08-13 ENCOUNTER — Ambulatory Visit: Payer: Medicare HMO | Admitting: Orthopaedic Surgery

## 2019-08-13 ENCOUNTER — Ambulatory Visit (INDEPENDENT_AMBULATORY_CARE_PROVIDER_SITE_OTHER): Payer: Medicare HMO | Admitting: Licensed Clinical Social Worker

## 2019-08-13 DIAGNOSIS — F419 Anxiety disorder, unspecified: Secondary | ICD-10-CM | POA: Diagnosis not present

## 2019-08-13 DIAGNOSIS — F319 Bipolar disorder, unspecified: Secondary | ICD-10-CM
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
## 2019-08-18 ENCOUNTER — Encounter: Payer: Medicare HMO | Admitting: Physical Therapy

## 2019-08-18 ENCOUNTER — Encounter: Payer: Self-pay | Admitting: Orthopaedic Surgery

## 2019-08-18 ENCOUNTER — Other Ambulatory Visit: Payer: Self-pay

## 2019-08-18 ENCOUNTER — Ambulatory Visit (INDEPENDENT_AMBULATORY_CARE_PROVIDER_SITE_OTHER): Payer: Medicare HMO | Admitting: Orthopaedic Surgery

## 2019-08-18 DIAGNOSIS — G44319 Acute post-traumatic headache, not intractable: Secondary | ICD-10-CM

## 2019-08-18 DIAGNOSIS — M542 Cervicalgia: Secondary | ICD-10-CM | POA: Diagnosis not present

## 2019-08-18 MED ORDER — CYCLOBENZAPRINE HCL 10 MG PO TABS: ORAL_TABLET | ORAL | 0 refills | Status: DC
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
## 2019-08-24 ENCOUNTER — Other Ambulatory Visit: Payer: Self-pay

## 2019-08-24 ENCOUNTER — Ambulatory Visit (INDEPENDENT_AMBULATORY_CARE_PROVIDER_SITE_OTHER): Payer: Medicare HMO | Admitting: Licensed Clinical Social Worker

## 2019-08-24 DIAGNOSIS — F319 Bipolar disorder, unspecified: Secondary | ICD-10-CM | POA: Diagnosis not present

## 2019-08-24 DIAGNOSIS — F419 Anxiety disorder, unspecified: Secondary | ICD-10-CM
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
## 2019-08-25 ENCOUNTER — Ambulatory Visit (INDEPENDENT_AMBULATORY_CARE_PROVIDER_SITE_OTHER): Payer: Medicare HMO | Admitting: Psychiatry

## 2019-08-25 ENCOUNTER — Other Ambulatory Visit: Payer: Self-pay

## 2019-08-25 ENCOUNTER — Encounter (HOSPITAL_COMMUNITY): Payer: Self-pay | Admitting: Psychiatry

## 2019-08-25 ENCOUNTER — Ambulatory Visit: Payer: Medicare HMO | Attending: Orthopedic Surgery

## 2019-08-25 DIAGNOSIS — R293 Abnormal posture: Secondary | ICD-10-CM | POA: Diagnosis not present

## 2019-08-25 DIAGNOSIS — R531 Weakness: Secondary | ICD-10-CM | POA: Insufficient documentation

## 2019-08-25 DIAGNOSIS — F319 Bipolar disorder, unspecified: Secondary | ICD-10-CM | POA: Diagnosis not present

## 2019-08-25 DIAGNOSIS — F5102 Adjustment insomnia: Secondary | ICD-10-CM

## 2019-08-25 DIAGNOSIS — M62838 Other muscle spasm: Secondary | ICD-10-CM | POA: Diagnosis not present

## 2019-08-25 DIAGNOSIS — M542 Cervicalgia: Secondary | ICD-10-CM

## 2019-08-25 DIAGNOSIS — M6281 Muscle weakness (generalized): Secondary | ICD-10-CM

## 2019-08-25 DIAGNOSIS — F411 Generalized anxiety disorder: Secondary | ICD-10-CM

## 2019-08-25 DIAGNOSIS — M546 Pain in thoracic spine: Secondary | ICD-10-CM | POA: Diagnosis not present

## 2019-08-25 DIAGNOSIS — F419 Anxiety disorder, unspecified: Secondary | ICD-10-CM | POA: Diagnosis not present

## 2019-08-25 MED ORDER — GABAPENTIN 100 MG PO CAPS: ORAL_CAPSULE | ORAL | 0 refills | Status: DC

## 2019-08-25 MED ORDER — CITALOPRAM HYDROBROMIDE 10 MG PO TABS: ORAL_TABLET | ORAL | 0 refills | Status: DC
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
## 2019-08-25 NOTE — Patient Instructions (Signed)
Access Code: [place]  URL: [email or web address]  Date: 08/25/2019  Prepared by: Sigurd Sos   Exercises  Seated Cervical Flexion AROM - 3 reps - 1 sets - 20 hold - 3x daily                            - 7x weekly Seated Cervical Sidebending AROM - 3 reps - 1 sets - 20 hold - 3x daily - 7x weekly Seated Cervical Rotation AROM - 3 reps - 1 sets - 20 hold - 3x daily - 7x weekly Seated Correct Posture - 10 reps - 3 sets - 1x daily - 7x weekly

## 2019-08-25 NOTE — Therapy (Signed)
[facility] [address] [place], [place], [address] Phone: [phone]   Fax:  [phone]  Physical Therapy Treatment  Patient Details  Name: Kellie [name] MRN: [id number] Date of Birth: [birth year] Referring Provider (PT): Arther Abbott, MD    Encounter Date: 08/25/2019  PT End of Session - 08/25/19 0836    Visit Number  3    Number of Visits  12    Date for PT Re-Evaluation  09/16/19    Authorization Type  [employer]- 12 visits    Authorization - Visit Number  3    Authorization - Number of Visits  12    PT Start Time  0801    PT Stop Time  [id number]    PT Time Calculation (min)  49 min    Activity Tolerance  Patient tolerated treatment well;No increased pain    Behavior During Therapy  WFL for tasks assessed/performed       Past Medical History:  Diagnosis Date  . Abnormal Pap smear of cervix   . Allergic rhinitis 11/08/2010  . Anxiety   . Asthma   . Bronchitis   . Herpes simplex of female genitalia   . Hyperlipidemia   . Mitral valve prolapse   . Osteoporosis 11/21/2011  . Urticaria     Past Surgical History:  Procedure Laterality Date  . CLOSED MANIPULATION SHOULDER  7&01/2005   x2, 30 days apart  . CLOSED MANIPULATION SHOULDER  2006   Left  . COLONOSCOPY  09/2012   Medium sized external hemorrhoids, few small divertic in left colon, otherwise normal colon (repeat 10 yrs)  . ESOPHAGOGASTRODUODENOSCOPY  09/2012   Normal (Dr. Ardis Hughs)  . LEEP  11/2004    There were no vitals filed for this visit.  Subjective Assessment - 08/25/19 0803    Subjective  I'm not doing well.  A lot of pain.  MD ordered an MRI and I will get it a week from now.    Currently in Pain?  Yes    Pain Score  8     Pain Location  Neck    Pain Orientation  Left;Right    Pain Descriptors / Indicators  Aching;Tightness    Pain Type  Acute pain    Pain Onset  1 to 4 weeks ago    Pain Frequency  Constant    Aggravating Factors    stress, turning head    Pain Relieving Factors  medication, heat/ice                       [facility] Adult PT Treatment/Exercise - 08/25/19 0001      Neck Exercises: Supine   Neck Retraction  3 secs;15 reps    Other Supine Exercise  neck retraction with pain free rotation x10 reps each     Other Supine Exercise  horizontal abduction and ER with red band 2x10 each      Lumbar Exercises: Aerobic   Nustep  L1 x5 min, PT present to monitor      Modalities   Modalities  Electrical Stimulation;Moist Heat      Moist Heat Therapy   Number Minutes Moist Heat  15 Minutes    Moist Heat Location  Cervical      Electrical Stimulation   Electrical Stimulation Location  bil neck    Electrical Stimulation Action  IFC    Electrical Stimulation Parameters  15 minutes    Electrical Stimulation Goals  Pain  Neck Exercises: Stretches   Other Neck Stretches  cervical A/ROM 3 ways 3x20 seconds              PT Education - 08/25/19 0826    Education Details  Access Code: [email or web address], meditation for pain, TENs unit information    Person(s) Educated  Patient    Methods  Explanation;Demonstration;Handout    Comprehension  Verbalized understanding;Returned demonstration       PT Short Term Goals - 08/06/19 0746      PT SHORT TERM GOAL #1   Title  Pt will demo consistency and independence with her initial HEP to increase ROM and decrease pain.    Time  3    Period  Weeks    Status  New    Target Date  08/26/19        PT Long Term Goals - 08/06/19 0746      PT LONG TERM GOAL #1   Title  Pt will be able to demonstrate good understanding of neck posture throughout her session without forward head/rounded shoulders during her session which will decrease strain on her neck.    Time  6    Period  Weeks    Status  New    Target Date  09/16/19      PT LONG TERM GOAL #2   Title  Pt will have greater than 40 deg of active cervical extension to allow her to look overhead  throughout the day.    Time  6    Period  Weeks    Status  New      PT LONG TERM GOAL #3   Title  Pt will be able to lift cones into an overhead cabinet x15 reps without increase in neck strain.    Time  6    Period  Weeks    Status  New      PT LONG TERM GOAL #4   Title  Pt will report atleast 50% improvement in her neck pain/stiffness throughout her day in order to improve her quality of life.    Time  6    Period  Weeks    Status  New            Plan - 08/25/19 0818    Clinical Impression Statement  Pt arrived with 8/10 bil neck pain and elevated shoulders.  PT spent session discussing posture, relaxation of shoulders and deep breathing/meditation to relax the nervous system and reduce pain.  Pt required frequent verbal cues to adjust posture throughout session.  Pt did well with the addition of cervical A/ROM for HEP.  Pt responded well to electrical stimulation for pain and PT issued information to order one for home use.  Pt will continue to benefit from skilled PT to address neck pain, improve strength and flexibility.    PT Frequency  2x / week    PT Duration  6 weeks    PT Treatment/Interventions  Functional mobility training;Patient/family education;Therapeutic activities;Manual techniques;Therapeutic exercise;Neuromuscular re-education;ADLs/Self Care Home Management;Electrical Stimulation;Moist Heat;Cryotherapy;Dry needling;Taping    PT Next Visit Plan  add postural strength to HEP, flexibility, manual and modalities as tolerated    PT Home Exercise Plan  Access Code: [email or web address]    Recommended Other Services  initial certification is signed    Consulted and Agree with Plan of Care  Patient       Patient will benefit from skilled therapeutic intervention in order to improve the following deficits and impairments:  Pain, Decreased  knowledge of precautions, Postural dysfunction, Increased muscle spasms, Decreased range of motion, Impaired flexibility  Visit  Diagnosis: Cervicalgia  Other muscle spasm  Muscle weakness (generalized)  Abnormal posture     Problem List Patient Active Problem List   Diagnosis Date Noted  . Anterolisthesis 07/28/2019  . Reactive airway disease that is not asthma 04/17/2019  . Plantar fasciitis, right 04/02/2019  . Vitamin D insufficiency 02/16/2019  . Cervical stenosis (uterine cervix) 02/13/2019  . Chronic pain of both ankles 01/15/2019  . Cortical age-related cataract of both eyes 12/22/2018  . Pinguecula of both eyes 12/22/2018  . Nuclear sclerotic cataract of both eyes 12/22/2018  . History of diverticulitis 09/16/2018  . Vaginal dryness 09/02/2018  . Dermatochalasis of both upper eyelids 03/13/2018  . Periodic limb movement 03/13/2018  . Nocturnal leg cramps 03/13/2018  . Aortic insufficiency 01/10/2018  . Non-restorative sleep 01/10/2018  . Snoring 01/10/2018  . Atypical chest pain 10/22/2017  . Anxiety 10/22/2017  . History of loop electrical excision procedure (LEEP) 07/01/2017  . Unintentional weight loss 05/18/2017  . Elevated LDL cholesterol level 05/18/2017  . Hyperlipidemia 05/18/2017  . Irritable bowel syndrome with both constipation and diarrhea 05/18/2017  . Family history of pancreatic cancer 02/18/2017  . Diarrhea 02/18/2017  . Loss of weight 02/18/2017  . Weakness 10/05/2016  . Panic attacks 09/18/2016  . SOB (shortness of breath) 09/18/2016  . Bipolar 1 disorder with moderate mania ([place]) 09/17/2016  . Pulmonary nodule 08/08/2016  . Hoarseness 03/09/2016  . Left knee pain 11/09/2015  . Chronic pain of both knees 11/09/2015  . Plantar fasciitis, bilateral 11/09/2015  . Cough, persistent 07/21/2015  . Post-nasal drip 07/21/2015  . DDD (degenerative disc disease), cervical 07/05/2015  . Chronic nasal congestion 03/01/2015  . Vitreous floaters of right eye 03/01/2015  . Near syncope 02/09/2015  . Rhinitis, allergic 10/14/2014  . Family history of renal cancer 10/11/2014   . DDD (degenerative disc disease), lumbar 10/11/2014  . Bipolar disorder with depression ([place]) 06/02/2014  . Postmenopausal atrophic vaginitis 03/17/2014  . Prediabetes 12/14/2013  . Encounter for gynecological examination with Papanicolaou smear of cervix 12/08/2013  . Adhesive capsulitis of right shoulder 11/24/2013  . Chronic periodontal disease 06/30/2013  . Genital herpes 11/05/2012  . Hemorrhoids 11/05/2012  . Borderline intellectual functioning 02/15/2012  . Osteoporosis 11/21/2011  . Cough 06/02/2011  . Allergic rhinitis 11/08/2010  . Insomnia 11/08/2010  . GERD (gastroesophageal reflux disease) 09/27/2010     Sigurd Sos, PT 08/25/19 8:38 AM  [facility] [address] [place], [place], [address] Phone: [phone]   Fax:  [phone]  Name: Kellie [name] MRN: [id number] Date of Birth: November 30, 1955

## 2019-08-26 ENCOUNTER — Other Ambulatory Visit (HOSPITAL_COMMUNITY): Payer: Self-pay | Admitting: Psychiatry

## 2019-08-26 ENCOUNTER — Other Ambulatory Visit: Payer: Self-pay | Admitting: Physician Assistant

## 2019-08-26 DIAGNOSIS — K2100 Gastro-esophageal reflux disease with esophagitis, without bleeding: Secondary | ICD-10-CM

## 2019-08-31 ENCOUNTER — Other Ambulatory Visit: Payer: Self-pay

## 2019-08-31 ENCOUNTER — Ambulatory Visit (INDEPENDENT_AMBULATORY_CARE_PROVIDER_SITE_OTHER): Payer: Medicare HMO

## 2019-08-31 DIAGNOSIS — M542 Cervicalgia: Secondary | ICD-10-CM | POA: Diagnosis not present

## 2019-09-01 ENCOUNTER — Ambulatory Visit (INDEPENDENT_AMBULATORY_CARE_PROVIDER_SITE_OTHER): Payer: Medicare HMO | Admitting: Orthopaedic Surgery

## 2019-09-01 ENCOUNTER — Encounter: Payer: Self-pay | Admitting: Orthopaedic Surgery

## 2019-09-01 ENCOUNTER — Ambulatory Visit: Payer: Medicare HMO

## 2019-09-01 VITALS — BP 98/57 | HR 78 | Temp 97.5°F | Ht 66.0 in | Wt 111.4 lb

## 2019-09-01 DIAGNOSIS — R293 Abnormal posture: Secondary | ICD-10-CM | POA: Diagnosis not present

## 2019-09-01 DIAGNOSIS — M6281 Muscle weakness (generalized): Secondary | ICD-10-CM | POA: Diagnosis not present

## 2019-09-01 DIAGNOSIS — R531 Weakness: Secondary | ICD-10-CM | POA: Diagnosis not present

## 2019-09-01 DIAGNOSIS — M542 Cervicalgia: Secondary | ICD-10-CM

## 2019-09-01 DIAGNOSIS — M62838 Other muscle spasm: Secondary | ICD-10-CM | POA: Diagnosis not present

## 2019-09-01 DIAGNOSIS — M546 Pain in thoracic spine: Secondary | ICD-10-CM | POA: Diagnosis not present
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
## 2019-09-01 NOTE — Progress Notes (Signed)
Patient [id number] Kellie [name], female DOB:1955/12/17, 64 y.o. [id number]  Chief Complaint  Patient presents with  . Neck Pain    Still having bad and good days    HPI  Kellie [name] is a 64 y.o. female who has neck pain.  She had MRI which showed: IMPRESSION: 1. Mild right neural foraminal stenosis at C3-4 and mild-to-moderate right neural foraminal stenosis at C4-5, increased from 2019. 2. Unchanged mild spinal stenosis and mild-to-moderate bilateral neural foraminal stenosis at C5-6.  I have explained the findings to her.  She does not need any surgery.  I will have her continue her medicine.  I have told her about Aspercreme, BioFreeze and Voltaren Gel.   Body mass index is 17.98 kg/m.  ROS  Review of Systems  Constitutional: Positive for activity change.  Respiratory: Positive for shortness of breath.   Musculoskeletal: Positive for arthralgias, back pain and neck pain.  Neurological: Positive for headaches.  Psychiatric/Behavioral: The patient is nervous/anxious.   All other systems reviewed and are negative.   All other systems reviewed and are negative.  The following is a summary of the past history medically, past history surgically, known current medicines, social history and family history.  This information is gathered electronically by the computer from prior information and documentation.  I review this each visit and have found including this information at this point in the chart is beneficial and informative.    Past Medical History:  Diagnosis Date  . Abnormal Pap smear of cervix   . Allergic rhinitis 11/08/2010  . Anxiety   . Asthma   . Bronchitis   . Herpes simplex of female genitalia   . Hyperlipidemia   . Mitral valve prolapse   . Osteoporosis 11/21/2011  . Urticaria     Past Surgical History:  Procedure Laterality Date  . CLOSED MANIPULATION SHOULDER  7&01/2005   x2, 30 days apart  . CLOSED MANIPULATION SHOULDER  2006   Left  .  COLONOSCOPY  09/2012   Medium sized external hemorrhoids, few small divertic in left colon, otherwise normal colon (repeat 10 yrs)  . ESOPHAGOGASTRODUODENOSCOPY  09/2012   Normal (Dr. Ardis Hughs)  . LEEP  11/2004    Family History  Problem Relation Age of Onset  . Kidney cancer Mother 25  . COPD Mother   . Cancer Mother        renal  . Kidney disease Mother   . Allergic rhinitis Mother   . Asthma Mother   . Heart disease Father   . Allergic rhinitis Father   . COPD Sister   . Depression Sister   . Pancreatic cancer Sister   . Cancer Sister        pancreatic  . Heart disease Brother   . Heart attack Brother   . Heart disease Paternal Uncle   . Diabetes Other        neice  . Thyroid disease Other        neice  . Depression Maternal Aunt   . Depression Maternal Grandfather   . Hypothyroidism Sister   . Hypothyroidism Sister   . Colon cancer Neg Hx   . Rectal cancer Neg Hx   . Stomach cancer Neg Hx   . Colon polyps Neg Hx     Social History Social History   Tobacco Use  . Smoking status: Never Smoker  . Smokeless tobacco: Never Used  Substance Use Topics  . Alcohol use: No  . Drug use: No  Allergies  Allergen Reactions  . Hydrocodone Nausea And Vomiting  . Abilify [Aripiprazole]     Nausea/vomiting/weakness/shaky  . Codeine Nausea And Vomiting  . Morphine And Related Nausea And Vomiting  . Percocet [Oxycodone-Acetaminophen] Nausea And Vomiting and Other (See Comments)    Patient states it makes blood pressure drop too much  . Vicodin [Hydrocodone-Acetaminophen] Nausea And Vomiting    Current Outpatient Medications  Medication Sig Dispense Refill  . acyclovir (ZOVIRAX) 200 MG capsule Take 2 capsules (400 mg total) by mouth 2 (two) times daily. 120 capsule 5  . albuterol (VENTOLIN HFA) 108 (90 Base) MCG/ACT inhaler INHALE 2 PUFFS INTO THE LUNGS EVERY 6 HOURS AS NEEDED FOR SHORTNESS OF BREATH OR WHEEZING. 8.5 g 0  . alendronate (FOSAMAX) 70 MG tablet Take 1  tablet (70 mg total) by mouth every 7 (seven) days. Take with a full glass of water on an empty stomach. 4 tablet 11  . ALPRAZolam (XANAX) 0.5 MG tablet TAKE 1 TABLET ONCE DAILY FOR ANXIETY. 30 tablet 5  . Cholecalciferol (VITAMIN D) 2000 units CAPS Take by mouth.    . citalopram (CELEXA) 10 MG tablet TAKE (1) TABLET BY MOUTH ONCE DAILY. 30 tablet 0  . conjugated estrogens (PREMARIN) vaginal cream One application three times a week. 30 g 12  . cyclobenzaprine (FLEXERIL) 10 MG tablet One half to one tab PO qHS 30 tablet 0  . diclofenac sodium (VOLTAREN) 1 % GEL Apply 4 g topically 4 (four) times daily. To affected joint. 100 g 1  . fluticasone (FLONASE) 50 MCG/ACT nasal spray Place 2 sprays into the nose daily. 16 g 3  . gabapentin (NEURONTIN) 100 MG capsule Change to four times a day. Delete prior refill sent 120 capsule 0  . hydrocortisone-pramoxine (PROCTOFOAM-HC) rectal foam Place 1 applicator rectally 2 (two) times daily. 10 g 1  . ketoconazole (NIZORAL) 2 % shampoo Apply 1 application topically 2 (two) times a week. 120 mL 1  . meloxicam (MOBIC) 15 MG tablet TAKE 1 TABLET IN THE MORNING WITH BREAKFAST FOR 2 WEEKS, THEN DAILY AS NEEDED FOR PAIN. 30 tablet 5  . montelukast (SINGULAIR) 10 MG tablet Take 1 tablet (10 mg total) by mouth at bedtime. 90 tablet 3  . omeprazole (PRILOSEC) 20 MG capsule Take 20 mg by mouth daily.    . ondansetron (ZOFRAN) 4 MG tablet Take 1 tablet (4 mg total) by mouth every 8 (eight) hours as needed for nausea or vomiting. 20 tablet 0  . pantoprazole (PROTONIX) 40 MG tablet TAKE (1) TABLET BY MOUTH ONCE DAILY. 90 tablet 0  . Probiotic Product (PROBIOTIC & ACIDOPHILUS EX ST PO) Take by mouth.    . promethazine (PHENERGAN) 12.5 MG tablet Take 1 tablet (12.5 mg total) by mouth every 6 (six) hours as needed for nausea or vomiting. 8 tablet 0  . Pseudoephedrine-Guaifenesin (MUCINEX D MAX STRENGTH PO) Take by mouth every 12 (twelve) hours as needed.     No current  facility-administered medications for this visit.     Physical Exam  Blood pressure (!) 98/57, pulse 78, temperature (!) 97.5 F (36.4 C), height 5\' 6"  (1.676 m), weight 111 lb 6 oz (50.5 kg).  Constitutional: overall normal hygiene, normal nutrition, well developed, normal grooming, normal body habitus. Assistive device:none  Musculoskeletal: gait and station Limp none, muscle tone and strength are normal, no tremors or atrophy is present.  .  Neurological: coordination overall normal.  Deep tendon reflex/nerve stretch intact.  Sensation normal.  Cranial nerves  II-XII intact.   Skin:   Normal overall no scars, lesions, ulcers or rashes. No psoriasis.  Psychiatric: Alert and oriented x 3.  Recent memory intact, remote memory unclear.  Normal mood and affect. Well groomed.  Good eye contact.  Cardiovascular: overall no swelling, no varicosities, no edema bilaterally, normal temperatures of the legs and arms, no clubbing, cyanosis and good capillary refill.  Lymphatic: palpation is normal.  All other systems reviewed and are negative   The patient has been educated about the nature of the problem(s) and counseled on treatment options.  The patient appeared to understand what I have discussed and is in agreement with it.  Encounter Diagnosis  Name Primary?  . Cervicalgia Yes    PLAN Call if any problems.  Precautions discussed.  Continue current medications.   Return to clinic 1 month   Electronically Signed Sanjuana Kava, MD 3/9/20213:05 PM

## 2019-09-03 ENCOUNTER — Other Ambulatory Visit: Payer: Self-pay

## 2019-09-03 ENCOUNTER — Ambulatory Visit: Payer: Medicare HMO

## 2019-09-03 DIAGNOSIS — M546 Pain in thoracic spine: Secondary | ICD-10-CM | POA: Diagnosis not present

## 2019-09-03 DIAGNOSIS — R293 Abnormal posture: Secondary | ICD-10-CM

## 2019-09-03 DIAGNOSIS — R531 Weakness: Secondary | ICD-10-CM | POA: Diagnosis not present

## 2019-09-03 DIAGNOSIS — M6281 Muscle weakness (generalized): Secondary | ICD-10-CM

## 2019-09-03 DIAGNOSIS — M62838 Other muscle spasm: Secondary | ICD-10-CM | POA: Diagnosis not present

## 2019-09-03 DIAGNOSIS — M542 Cervicalgia: Secondary | ICD-10-CM
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
## 2019-09-03 NOTE — Patient Instructions (Signed)
Access Code: [place] URL: [email or web address] Date: 09/03/2019 Prepared by: Sigurd Sos  Exercises  Supine Shoulder Horizontal Abduction with Resistance - 10 reps - 2 sets - 2x daily - 7x weekly Supine Bilateral Shoulder External Rotation with Resistance - 10 reps - 2 sets - 2x daily - 7x weekly Supine PNF D2 Flexion with Resistance - 10 reps - 2 sets - 2x daily - 7x weekly

## 2019-09-14 ENCOUNTER — Ambulatory Visit: Payer: Medicare HMO

## 2019-09-14 ENCOUNTER — Other Ambulatory Visit: Payer: Self-pay

## 2019-09-14 DIAGNOSIS — R293 Abnormal posture: Secondary | ICD-10-CM

## 2019-09-14 DIAGNOSIS — M6281 Muscle weakness (generalized): Secondary | ICD-10-CM | POA: Diagnosis not present

## 2019-09-14 DIAGNOSIS — M62838 Other muscle spasm: Secondary | ICD-10-CM | POA: Diagnosis not present

## 2019-09-14 DIAGNOSIS — M546 Pain in thoracic spine: Secondary | ICD-10-CM | POA: Diagnosis not present

## 2019-09-14 DIAGNOSIS — M542 Cervicalgia: Secondary | ICD-10-CM | POA: Diagnosis not present

## 2019-09-14 DIAGNOSIS — R531 Weakness: Secondary | ICD-10-CM | POA: Diagnosis not present
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
## 2019-09-14 NOTE — Patient Instructions (Signed)
Access Code: [place] URL: [email or web address] Date: 09/14/2019 Prepared by: Claiborne Billings  Scapular Retraction with Resistance - 2 x daily - 7 x weekly - 10 reps - 2 sets Shoulder extension with resistance - Neutral - 2 x daily - 7 x weekly - 10 reps - 2 sets Cervical Rotation Prone on Elbows - 1 x daily - 7 x weekly - 3 sets - 10 reps

## 2019-09-15 ENCOUNTER — Other Ambulatory Visit: Payer: Self-pay

## 2019-09-15 ENCOUNTER — Ambulatory Visit: Payer: Medicare HMO

## 2019-09-15 DIAGNOSIS — M62838 Other muscle spasm: Secondary | ICD-10-CM | POA: Diagnosis not present

## 2019-09-15 DIAGNOSIS — M6281 Muscle weakness (generalized): Secondary | ICD-10-CM

## 2019-09-15 DIAGNOSIS — M542 Cervicalgia: Secondary | ICD-10-CM | POA: Diagnosis not present

## 2019-09-15 DIAGNOSIS — R293 Abnormal posture: Secondary | ICD-10-CM | POA: Diagnosis not present

## 2019-09-15 DIAGNOSIS — R531 Weakness: Secondary | ICD-10-CM | POA: Diagnosis not present

## 2019-09-15 DIAGNOSIS — M546 Pain in thoracic spine: Secondary | ICD-10-CM | POA: Diagnosis not present
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
## 2019-09-17 ENCOUNTER — Encounter: Payer: Self-pay | Admitting: Physician Assistant

## 2019-09-21 ENCOUNTER — Other Ambulatory Visit: Payer: Self-pay

## 2019-09-21 ENCOUNTER — Ambulatory Visit: Payer: Medicare HMO

## 2019-09-21 DIAGNOSIS — M546 Pain in thoracic spine: Secondary | ICD-10-CM

## 2019-09-21 DIAGNOSIS — M542 Cervicalgia: Secondary | ICD-10-CM

## 2019-09-21 DIAGNOSIS — M6281 Muscle weakness (generalized): Secondary | ICD-10-CM

## 2019-09-21 DIAGNOSIS — M62838 Other muscle spasm: Secondary | ICD-10-CM | POA: Diagnosis not present

## 2019-09-21 DIAGNOSIS — R531 Weakness: Secondary | ICD-10-CM | POA: Diagnosis not present

## 2019-09-21 DIAGNOSIS — R293 Abnormal posture: Secondary | ICD-10-CM
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
## 2019-09-28 ENCOUNTER — Telehealth: Payer: Self-pay | Admitting: Neurology

## 2019-09-28 NOTE — Telephone Encounter (Signed)
Patient left vm stating she thinks she might have an infection on the top of her foot. Wants to do a video visit. Please call patient back to schedule. Thanks!

## 2019-09-29 ENCOUNTER — Ambulatory Visit: Payer: Medicare HMO | Admitting: Physician Assistant

## 2019-09-29 ENCOUNTER — Other Ambulatory Visit (HOSPITAL_COMMUNITY): Payer: Self-pay | Admitting: Psychiatry

## 2019-09-29 ENCOUNTER — Encounter: Payer: Self-pay | Admitting: Physician Assistant

## 2019-09-29 ENCOUNTER — Ambulatory Visit (INDEPENDENT_AMBULATORY_CARE_PROVIDER_SITE_OTHER): Payer: Medicare HMO | Admitting: Physician Assistant

## 2019-09-29 ENCOUNTER — Ambulatory Visit: Payer: Medicare HMO | Admitting: Orthopaedic Surgery

## 2019-09-29 VITALS — BP 116/76 | HR 80 | Temp 98.5°F | Ht 66.0 in | Wt 110.0 lb

## 2019-09-29 DIAGNOSIS — R238 Other skin changes: Secondary | ICD-10-CM

## 2019-09-29 DIAGNOSIS — L03116 Cellulitis of left lower limb: Secondary | ICD-10-CM | POA: Diagnosis not present

## 2019-09-29 DIAGNOSIS — M5136 Other intervertebral disc degeneration, lumbar region: Secondary | ICD-10-CM

## 2019-09-29 DIAGNOSIS — M5030 Other cervical disc degeneration, unspecified cervical region: Secondary | ICD-10-CM

## 2019-09-29 MED ORDER — VALACYCLOVIR HCL 1 G PO TABS: 1000.0000 mg | ORAL_TABLET | Freq: Three times a day (TID) | ORAL | 0 refills | Status: DC

## 2019-09-29 MED ORDER — CEPHALEXIN 500 MG PO CAPS: 500.0000 mg | ORAL_CAPSULE | Freq: Two times a day (BID) | ORAL | 0 refills | Status: DC

## 2019-09-29 NOTE — Progress Notes (Signed)
Subjective:    Patient ID: Kellie [name], female    DOB: 05/14/1956, 64 y.o.   MRN: [id number]  HPI Pt is a 64 yo female who presents to the clinic with multiple vesicles on her dorsal left foot for last 7 days. She was sitting in the sun with shoes on when she felt the burning and tingling for the first time. She took her shoe off and saw the first vesicle. She has 5 more today. Denies seeing any bug/insect. She denies any itching. She does feel like vesicles are getting more red and angry. She did try to pop one blister. As long as anything does not touch the vesicles it is manageable. She is taking aleve and helps a lot. No fever, chills, nausea, body aches. She has been putting neosporin on it. She does have hx of genital herpes and takes valtrex daily for suppression.    Pt has ongoing cervical and lumbar DDD she has lots of issues with arthritic pain. She is requesting note that she can not drive long distances. She needs to give to prison so they would consider her sisters son to move closer. She is having more and more pain when she drives her sister to prison.   .. Active Ambulatory Problems    Diagnosis Date Noted  . GERD (gastroesophageal reflux disease) 09/27/2010  . Allergic rhinitis 11/08/2010  . Insomnia 11/08/2010  . Cough 06/02/2011  . Osteoporosis 11/21/2011  . Borderline intellectual functioning 02/15/2012  . Genital herpes 11/05/2012  . Hemorrhoids 11/05/2012  . Chronic periodontal disease 06/30/2013  . Adhesive capsulitis of right shoulder 11/24/2013  . Encounter for gynecological examination with Papanicolaou smear of cervix 12/08/2013  . Prediabetes 12/14/2013  . Postmenopausal atrophic vaginitis 03/17/2014  . Bipolar disorder with depression ([place]) 06/02/2014  . Family history of renal cancer 10/11/2014  . DDD (degenerative disc disease), lumbar 10/11/2014  . Rhinitis, allergic 10/14/2014  . Near syncope 02/09/2015  . Chronic nasal congestion 03/01/2015  .  Vitreous floaters of right eye 03/01/2015  . DDD (degenerative disc disease), cervical 07/05/2015  . Cough, persistent 07/21/2015  . Post-nasal drip 07/21/2015  . Left knee pain 11/09/2015  . Chronic pain of both knees 11/09/2015  . Plantar fasciitis, bilateral 11/09/2015  . Hoarseness 03/09/2016  . Pulmonary nodule 08/08/2016  . Bipolar 1 disorder with moderate mania ([place]) 09/17/2016  . Panic attacks 09/18/2016  . SOB (shortness of breath) 09/18/2016  . Weakness 10/05/2016  . Family history of pancreatic cancer 02/18/2017  . Diarrhea 02/18/2017  . Loss of weight 02/18/2017  . Unintentional weight loss 05/18/2017  . Elevated LDL cholesterol level 05/18/2017  . Hyperlipidemia 05/18/2017  . Irritable bowel syndrome with both constipation and diarrhea 05/18/2017  . History of loop electrical excision procedure (LEEP) 07/01/2017  . Atypical chest pain 10/22/2017  . Anxiety 10/22/2017  . Aortic insufficiency 01/10/2018  . Non-restorative sleep 01/10/2018  . Snoring 01/10/2018  . Dermatochalasis of both upper eyelids 03/13/2018  . Periodic limb movement 03/13/2018  . Nocturnal leg cramps 03/13/2018  . Vaginal dryness 09/02/2018  . History of diverticulitis 09/16/2018  . Chronic pain of both ankles 01/15/2019  . Cervical stenosis (uterine cervix) 02/13/2019  . Vitamin D insufficiency 02/16/2019  . Plantar fasciitis, right 04/02/2019  . Reactive airway disease that is not asthma 04/17/2019  . Cortical age-related cataract of both eyes 12/22/2018  . Pinguecula of both eyes 12/22/2018  . Nuclear sclerotic cataract of both eyes 12/22/2018  . Anterolisthesis 07/28/2019  Resolved Ambulatory Problems    Diagnosis Date Noted  . Sinusitis 09/27/2010  . Depression 09/27/2010  . Bronchitis 12/04/2010  . General medical examination 10/18/2011  . Dizziness 11/21/2011  . Knee pain 11/21/2011  . Low back pain 11/21/2011  . Heel pain 07/14/2012  . Thoracic myofascial strain 03/13/2013   . Sinusitis 04/20/2013  . Cough 04/22/2013  . Rotator cuff disorder 08/01/2013  . Right shoulder pain 10/27/2013  . Adhesive capsulitis 11/02/2013  . Pain in joint, shoulder region 11/24/2013  . Bipolar disorder ([place]) 04/29/2014  . Right serous otitis media 08/03/2014  . Acute recurrent maxillary sinusitis 10/14/2014  . Right-sided low back pain without sciatica 10/14/2014  . Sinusitis 02/09/2015  . Sacroiliac joint dysfunction of right side 02/25/2015  . Piriformis syndrome 03/01/2015  . Adhesive capsulitis 07/05/2015  . Radiculitis of right cervical region 07/06/2015  . Drug reaction 09/25/2016  . Mitral valve prolapse 01/10/2018   Past Medical History:  Diagnosis Date  . Abnormal Pap smear of cervix   . Allergic rhinitis 11/08/2010  . Asthma   . Herpes simplex of female genitalia   . Urticaria       Review of Systems See HPI.     Objective:   Physical Exam Vitals reviewed.  Constitutional:      Appearance: Normal appearance.  HENT:     Head: Normocephalic.  Cardiovascular:     Rate and Rhythm: Normal rate and regular rhythm.     Pulses: Normal pulses.  Pulmonary:     Effort: Pulmonary effort is normal.     Breath sounds: Normal breath sounds.  Skin:    Comments: 5 vesicles in a cluster on dorsal left foot with surrounding swelling, warmth and redness.   Neurological:     General: No focal deficit present.     Mental Status: She is alert and oriented to person, place, and time.  Psychiatric:        Mood and Affect: Mood normal.           Assessment & Plan:  [name] KitchenMarland KitchenGyllian was seen today for skin problem.  Diagnoses and all orders for this visit:  Vesicles -     valACYclovir (VALTREX) 1000 MG tablet; Take 1 tablet (1,000 mg total) by mouth 3 (three) times daily. For 7 days. -     Herpes simplex virus(hsv) dna by pcr  Cellulitis of left lower extremity -     cephALEXin (KEFLEX) 500 MG capsule; Take 1 capsule (500 mg total) by mouth 2 (two) times  daily.  DDD (degenerative disc disease), lumbar  DDD (degenerative disc disease), cervical   Looks like herpes simplex/zoster. Valtrex started. There is some warmth and redness like cellulitis starting. Start kelfex. Culture vesicle will call with results. Cool compresses. NsAIDs as needed.   Letter written for driving.

## 2019-09-30 ENCOUNTER — Encounter: Payer: Self-pay | Admitting: Physician Assistant

## 2019-09-30 ENCOUNTER — Other Ambulatory Visit: Payer: Self-pay | Admitting: Physician Assistant

## 2019-09-30 DIAGNOSIS — M810 Age-related osteoporosis without current pathological fracture: Secondary | ICD-10-CM

## 2019-09-30 LAB — TIQ-NTM

## 2019-09-30 NOTE — Progress Notes (Signed)
Can we look into this? We looked it up.

## 2019-10-01 ENCOUNTER — Other Ambulatory Visit: Payer: Self-pay

## 2019-10-01 ENCOUNTER — Encounter: Payer: Self-pay | Admitting: Physician Assistant

## 2019-10-01 ENCOUNTER — Ambulatory Visit: Payer: Medicare HMO | Attending: Orthopedic Surgery

## 2019-10-01 DIAGNOSIS — M6281 Muscle weakness (generalized): Secondary | ICD-10-CM | POA: Diagnosis not present

## 2019-10-01 DIAGNOSIS — R293 Abnormal posture: Secondary | ICD-10-CM | POA: Insufficient documentation

## 2019-10-01 DIAGNOSIS — M62838 Other muscle spasm: Secondary | ICD-10-CM | POA: Diagnosis not present

## 2019-10-01 DIAGNOSIS — M546 Pain in thoracic spine: Secondary | ICD-10-CM | POA: Diagnosis not present

## 2019-10-01 DIAGNOSIS — M542 Cervicalgia: Secondary | ICD-10-CM | POA: Insufficient documentation

## 2019-10-01 DIAGNOSIS — R531 Weakness: Secondary | ICD-10-CM | POA: Insufficient documentation

## 2019-10-01 NOTE — Therapy (Signed)
[facility] [address] [place], [place], [address] Phone: [phone]   Fax:  [phone]  Physical Therapy Treatment  Patient Details  Name: Kellie [name] MRN: [id number] Date of Birth: 1956/04/10 Referring Provider (PT): Arther Abbott, MD    Encounter Date: 10/01/2019  PT End of Session - 10/01/19 0925    Visit Number  9    Date for PT Re-Evaluation  10/30/19    Authorization Type  [employer]- 12 visits    Authorization Time Period  3/25-5/19/21    Authorization - Visit Number  2    PT Start Time  [id number]    PT Stop Time  0926    PT Time Calculation (min)  39 min    Activity Tolerance  Patient tolerated treatment well;No increased pain    Behavior During Therapy  WFL for tasks assessed/performed       Past Medical History:  Diagnosis Date  . Abnormal Pap smear of cervix   . Allergic rhinitis 11/08/2010  . Anxiety   . Asthma   . Bronchitis   . Herpes simplex of female genitalia   . Hyperlipidemia   . Mitral valve prolapse   . Osteoporosis 11/21/2011  . Urticaria     Past Surgical History:  Procedure Laterality Date  . CLOSED MANIPULATION SHOULDER  7&01/2005   x2, 30 days apart  . CLOSED MANIPULATION SHOULDER  2006   Left  . COLONOSCOPY  09/2012   Medium sized external hemorrhoids, few small divertic in left colon, otherwise normal colon (repeat 10 yrs)  . ESOPHAGOGASTRODUODENOSCOPY  09/2012   Normal (Dr. Ardis Hughs)  . LEEP  11/2004    There were no vitals filed for this visit.  Subjective Assessment - 10/01/19 0851    Subjective  I'm feeling good and I've been doing my exercises.    Currently in Pain?  Yes    Pain Score  4     Pain Location  Neck    Pain Orientation  Right;Left    Pain Descriptors / Indicators  Aching;Tightness    Pain Type  Acute pain    Pain Onset  More than a month ago    Aggravating Factors   stress, activity    Pain Relieving Factors  rest, medication         [facility] PT  Assessment - 10/01/19 0001      AROM   Cervical - Right Rotation  60    Cervical - Left Rotation  65                   [facility] Adult PT Treatment/Exercise - 10/01/19 0001      Lumbar Exercises: Aerobic   Recumbent Bike  Level 1x 6 minutes    PT present to discuss progress     Shoulder Exercises: Seated   Other Seated Exercises  3 way raises: 1# 2x10      Shoulder Exercises: Standing   Horizontal ABduction  Strengthening;Both;10 reps;Theraband    Theraband Level (Shoulder Horizontal ABduction)  Level 1 (Yellow)    External Rotation  Strengthening;Both;10 reps;Theraband    Theraband Level (Shoulder External Rotation)  Level 1 (Yellow)    Extension  Strengthening;Both;20 reps    Theraband Level (Shoulder Extension)  Level 2 (Red)    Row  Strengthening;Both;20 reps;Theraband    Theraband Level (Shoulder Row)  Level 2 (Red)      Shoulder Exercises: ROM/Strengthening   UBE (Upper Arm Bike)  Level 1x 6 (3/3)  PT Short Term Goals - 09/14/19 0932      PT SHORT TERM GOAL #2   Title  Pt will report atleast 25% reduction in her pain with daily activity.     Baseline  50-60%    Status  Achieved      PT SHORT TERM GOAL #3   Title  Pt will demo proper set up and use of lumbar roll while sitting, to improve posture and body mechanics throughout the day.    Status  Achieved        PT Long Term Goals - 10/01/19 0906      PT LONG TERM GOAL #1   Title  Pt will be able to demonstrate good understanding of neck posture throughout her session without forward head/rounded shoulders during her session which will decrease strain on her neck.    Status  Achieved      PT LONG TERM GOAL #4   Title  Pt will report atleast 75% improvement in her neck pain/stiffness throughout her day in order to improve her quality of life.    Baseline  80% improvement    Status  Achieved      PT LONG TERM GOAL #5   Title  demonstrate ability to maintain upright posture  throughout full treatment session due to improved strength and body awareness    Baseline  intermittent cues needed    Time  6    Period  Weeks    Status  On-going            Plan - 10/01/19 0909    Clinical Impression Statement  Pt reports 80% overall improvement in neck pain since the start of care and has reduced pain levels today.  Pt was able to participate in strengthening exercises without increased pain.  Pt with improved postural awareness and  endurance throughout session today.  Pt will continue to benefit from skilled PT to improve strength, endurance and flexibility.    PT Frequency  2x / week    PT Duration  6 weeks    PT Treatment/Interventions  Functional mobility training;Patient/family education;Therapeutic activities;Manual techniques;Therapeutic exercise;Neuromuscular re-education;ADLs/Self Care Home Management;Electrical Stimulation;Moist Heat;Cryotherapy;Dry needling;Taping    PT Next Visit Plan  continue strength, flexibility and mobility    PT Home Exercise Plan  Access Code: [email or web address]    Consulted and Agree with Plan of Care  Patient       Patient will benefit from skilled therapeutic intervention in order to improve the following deficits and impairments:  Pain, Decreased knowledge of precautions, Postural dysfunction, Increased muscle spasms, Decreased range of motion, Impaired flexibility  Visit Diagnosis: Cervicalgia  Other muscle spasm     Problem List Patient Active Problem List   Diagnosis Date Noted  . Anterolisthesis 07/28/2019  . Reactive airway disease that is not asthma 04/17/2019  . Plantar fasciitis, right 04/02/2019  . Vitamin D insufficiency 02/16/2019  . Cervical stenosis (uterine cervix) 02/13/2019  . Chronic pain of both ankles 01/15/2019  . Cortical age-related cataract of both eyes 12/22/2018  . Pinguecula of both eyes 12/22/2018  . Nuclear sclerotic cataract of both eyes 12/22/2018  . History of diverticulitis 09/16/2018  .  Vaginal dryness 09/02/2018  . Dermatochalasis of both upper eyelids 03/13/2018  . Periodic limb movement 03/13/2018  . Nocturnal leg cramps 03/13/2018  . Aortic insufficiency 01/10/2018  . Non-restorative sleep 01/10/2018  . Snoring 01/10/2018  . Atypical chest pain 10/22/2017  . Anxiety 10/22/2017  . History of loop electrical excision  procedure (LEEP) 07/01/2017  . Unintentional weight loss 05/18/2017  . Elevated LDL cholesterol level 05/18/2017  . Hyperlipidemia 05/18/2017  . Irritable bowel syndrome with both constipation and diarrhea 05/18/2017  . Family history of pancreatic cancer 02/18/2017  . Diarrhea 02/18/2017  . Loss of weight 02/18/2017  . Weakness 10/05/2016  . Panic attacks 09/18/2016  . SOB (shortness of breath) 09/18/2016  . Bipolar 1 disorder with moderate mania ([place]) 09/17/2016  . Pulmonary nodule 08/08/2016  . Hoarseness 03/09/2016  . Left knee pain 11/09/2015  . Chronic pain of both knees 11/09/2015  . Plantar fasciitis, bilateral 11/09/2015  . Cough, persistent 07/21/2015  . Post-nasal drip 07/21/2015  . DDD (degenerative disc disease), cervical 07/05/2015  . Chronic nasal congestion 03/01/2015  . Vitreous floaters of right eye 03/01/2015  . Near syncope 02/09/2015  . Rhinitis, allergic 10/14/2014  . Family history of renal cancer 10/11/2014  . DDD (degenerative disc disease), lumbar 10/11/2014  . Bipolar disorder with depression ([place]) 06/02/2014  . Postmenopausal atrophic vaginitis 03/17/2014  . Prediabetes 12/14/2013  . Encounter for gynecological examination with Papanicolaou smear of cervix 12/08/2013  . Adhesive capsulitis of right shoulder 11/24/2013  . Chronic periodontal disease 06/30/2013  . Genital herpes 11/05/2012  . Hemorrhoids 11/05/2012  . Borderline intellectual functioning 02/15/2012  . Osteoporosis 11/21/2011  . Cough 06/02/2011  . Allergic rhinitis 11/08/2010  . Insomnia 11/08/2010  . GERD (gastroesophageal reflux disease)  09/27/2010     Sigurd Sos, PT 10/01/19 9:28 AM  [facility] [address] [place], [place], [address] Phone: [phone]   Fax:  [phone]  Name: JOHNIE STONEHOUSE MRN: [id number] Date of Birth: 1955-08-14

## 2019-10-02 ENCOUNTER — Encounter: Payer: Self-pay | Admitting: Physician Assistant

## 2019-10-02 NOTE — Telephone Encounter (Signed)
New order is in process.

## 2019-10-02 NOTE — Telephone Encounter (Signed)
What actually happened what was done wrong?

## 2019-10-02 NOTE — Telephone Encounter (Signed)
The order couldn't be done in the orange top tube, even though the paper from [facility] said it could. We changed to a different culture for HSV 1 and 2 that can be performed with orange top. It's processing.

## 2019-10-04 LAB — SURESWAB HSV, TYPE 1/2 DNA, PCR
HSV 1 DNA: NOT DETECTED
HSV 2 DNA: NOT DETECTED

## 2019-10-05 NOTE — Progress Notes (Signed)
Can herpes zoster be tested as well Kellie [name]?

## 2019-10-07 ENCOUNTER — Ambulatory Visit: Payer: Medicare HMO

## 2019-10-07 ENCOUNTER — Other Ambulatory Visit: Payer: Self-pay

## 2019-10-07 DIAGNOSIS — M542 Cervicalgia: Secondary | ICD-10-CM

## 2019-10-07 DIAGNOSIS — M6281 Muscle weakness (generalized): Secondary | ICD-10-CM

## 2019-10-07 DIAGNOSIS — R531 Weakness: Secondary | ICD-10-CM

## 2019-10-07 DIAGNOSIS — M62838 Other muscle spasm: Secondary | ICD-10-CM | POA: Diagnosis not present

## 2019-10-07 DIAGNOSIS — M546 Pain in thoracic spine: Secondary | ICD-10-CM | POA: Diagnosis not present

## 2019-10-07 DIAGNOSIS — R293 Abnormal posture: Secondary | ICD-10-CM

## 2019-10-07 NOTE — Therapy (Signed)
[facility] [address], [place], [place], [address] Phone: [phone]   Fax:  [phone]  Physical Therapy Treatment  Patient Details  Name: Kellie [name] MRN: [id number] Date of Birth: 1955/10/07 Referring Provider (PT): Arther Abbott, MD    Encounter Date: 10/07/2019 Progress Note Reporting Period 08/05/19 to 10/07/19 See note below for Objective Data and Assessment of Progress/Goals.      PT End of Session - 10/07/19 0841    Visit Number  10    Date for PT Re-Evaluation  10/30/19    Authorization Type  [employer]- 12 visits    Authorization - Visit Number  3    Authorization - Number of Visits  12    PT Start Time  0800    PT Stop Time  0840    PT Time Calculation (min)  40 min    Activity Tolerance  Patient tolerated treatment well;No increased pain       Past Medical History:  Diagnosis Date  . Abnormal Pap smear of cervix   . Allergic rhinitis 11/08/2010  . Anxiety   . Asthma   . Bronchitis   . Herpes simplex of female genitalia   . Hyperlipidemia   . Mitral valve prolapse   . Osteoporosis 11/21/2011  . Urticaria     Past Surgical History:  Procedure Laterality Date  . CLOSED MANIPULATION SHOULDER  7&01/2005   x2, 30 days apart  . CLOSED MANIPULATION SHOULDER  2006   Left  . COLONOSCOPY  09/2012   Medium sized external hemorrhoids, few small divertic in left colon, otherwise normal colon (repeat 10 yrs)  . ESOPHAGOGASTRODUODENOSCOPY  09/2012   Normal (Dr. Ardis Hughs)  . LEEP  11/2004    There were no vitals filed for this visit.  Subjective Assessment - 10/07/19 0806    Subjective  I had a bad day yesterday (8/10 neck pain), no pain today.    Currently in Pain?  No/denies         [facility] PT Assessment - 10/07/19 0001      Assessment   Medical Diagnosis  Neck pain     Referring Provider (PT)  Arther Abbott, MD     Onset Date/Surgical Date  07/17/19      Observation/Other Assessments    Focus on Therapeutic Outcomes (FOTO)   50% limitation      AROM   Cervical - Right Rotation  60    Cervical - Left Rotation  65                   [facility] Adult PT Treatment/Exercise - 10/07/19 0001      Lumbar Exercises: Aerobic   UBE (Upper Arm Bike)  Level 1x 6 minutes (3/3) PT present to discuss progress      Shoulder Exercises: Seated   Other Seated Exercises  3 way raises: 1# 2x10      Shoulder Exercises: Standing   Horizontal ABduction  Strengthening;Both;10 reps;Theraband    Theraband Level (Shoulder Horizontal ABduction)  Level 2 (Red)    External Rotation  Strengthening;Both;10 reps;Theraband    Theraband Level (Shoulder External Rotation)  Level 2 (Red)      Manual Therapy   Manual Therapy  Joint mobilization;Soft tissue mobilization    Manual therapy comments  PA mobs C4-6 grade 2, passive stretch to neck    Soft tissue mobilization  bilat SCM tight, manual STM to bilat cervical paraspinals  PT Short Term Goals - 10/07/19 9038      PT SHORT TERM GOAL #2   Title  Pt will report atleast 25% reduction in her pain with daily activity.     Status  Achieved        PT Long Term Goals - 10/07/19 3338      PT LONG TERM GOAL #1   Title  Pt will be able to demonstrate good understanding of neck posture throughout her session without forward head/rounded shoulders during her session which will decrease strain on her neck.    Time  6    Status  Achieved      PT LONG TERM GOAL #2   Title  Pt will have greater than 40 deg of active cervical extension to allow her to look overhead throughout the day.    Baseline  45 met    Status  Achieved      PT LONG TERM GOAL #3   Baseline  mild neck strain x 2 minutes of stacking into/out of cabinet    Status  On-going      PT LONG TERM GOAL #4   Title  Pt will report atleast 75% improvement in her neck pain/stiffness throughout her day in order to improve her quality of life.    Baseline  80%  improvement    Status  Achieved      PT LONG TERM GOAL #5   Title  demonstrate ability to maintain upright posture throughout full treatment session due to improved strength and body awareness    Baseline  intermittent cues needed    Status  On-going      PT LONG TERM GOAL #6   Title  demonstrate cervical A/ROM rotation to > or = to 70 degrees to improve safety with driving    Time  6    Period  Weeks    Status  On-going            Plan - 10/07/19 0817    Clinical Impression Statement  Pt reports 80% overall improvement in neck pain since the start of care and has reduced pain levels today.  Pain was 8/10 yesterday.    Pt was able to participate in strengthening exercises without increased pain.  Pt with improved postural awareness and endurance throughout session today.  Minor verbal cues required for posture and scapular position with exercise today.  Pt will continue to benefit from skilled PT to improve strength, endurance and flexibility.    PT Frequency  2x / week    PT Duration  6 weeks    PT Treatment/Interventions  Functional mobility training;Patient/family education;Therapeutic activities;Manual techniques;Therapeutic exercise;Neuromuscular re-education;ADLs/Self Care Home Management;Electrical Stimulation;Moist Heat;Cryotherapy;Dry needling;Taping    PT Next Visit Plan  continue strength, flexibility and mobility    PT Home Exercise Plan  Access Code: [email or web address]    Consulted and Agree with Plan of Care  Patient       Patient will benefit from skilled therapeutic intervention in order to improve the following deficits and impairments:  Pain, Decreased knowledge of precautions, Postural dysfunction, Increased muscle spasms, Decreased range of motion, Impaired flexibility  Visit Diagnosis: Cervicalgia  Other muscle spasm  Muscle weakness (generalized)  Abnormal posture  Pain in thoracic spine  Weakness generalized     Problem List Patient Active Problem List    Diagnosis Date Noted  . Anterolisthesis 07/28/2019  . Reactive airway disease that is not asthma 04/17/2019  . Plantar fasciitis, right 04/02/2019  .  Vitamin D insufficiency 02/16/2019  . Cervical stenosis (uterine cervix) 02/13/2019  . Chronic pain of both ankles 01/15/2019  . Cortical age-related cataract of both eyes 12/22/2018  . Pinguecula of both eyes 12/22/2018  . Nuclear sclerotic cataract of both eyes 12/22/2018  . History of diverticulitis 09/16/2018  . Vaginal dryness 09/02/2018  . Dermatochalasis of both upper eyelids 03/13/2018  . Periodic limb movement 03/13/2018  . Nocturnal leg cramps 03/13/2018  . Aortic insufficiency 01/10/2018  . Non-restorative sleep 01/10/2018  . Snoring 01/10/2018  . Atypical chest pain 10/22/2017  . Anxiety 10/22/2017  . History of loop electrical excision procedure (LEEP) 07/01/2017  . Unintentional weight loss 05/18/2017  . Elevated LDL cholesterol level 05/18/2017  . Hyperlipidemia 05/18/2017  . Irritable bowel syndrome with both constipation and diarrhea 05/18/2017  . Family history of pancreatic cancer 02/18/2017  . Diarrhea 02/18/2017  . Loss of weight 02/18/2017  . Weakness 10/05/2016  . Panic attacks 09/18/2016  . SOB (shortness of breath) 09/18/2016  . Bipolar 1 disorder with moderate mania ([place]) 09/17/2016  . Pulmonary nodule 08/08/2016  . Hoarseness 03/09/2016  . Left knee pain 11/09/2015  . Chronic pain of both knees 11/09/2015  . Plantar fasciitis, bilateral 11/09/2015  . Cough, persistent 07/21/2015  . Post-nasal drip 07/21/2015  . DDD (degenerative disc disease), cervical 07/05/2015  . Chronic nasal congestion 03/01/2015  . Vitreous floaters of right eye 03/01/2015  . Near syncope 02/09/2015  . Rhinitis, allergic 10/14/2014  . Family history of renal cancer 10/11/2014  . DDD (degenerative disc disease), lumbar 10/11/2014  . Bipolar disorder with depression ([place]) 06/02/2014  . Postmenopausal atrophic vaginitis  03/17/2014  . Prediabetes 12/14/2013  . Encounter for gynecological examination with Papanicolaou smear of cervix 12/08/2013  . Adhesive capsulitis of right shoulder 11/24/2013  . Chronic periodontal disease 06/30/2013  . Genital herpes 11/05/2012  . Hemorrhoids 11/05/2012  . Borderline intellectual functioning 02/15/2012  . Osteoporosis 11/21/2011  . Cough 06/02/2011  . Allergic rhinitis 11/08/2010  . Insomnia 11/08/2010  . GERD (gastroesophageal reflux disease) 09/27/2010     Sigurd Sos, PT 10/07/19 8:45 AM  [facility] [address] [place], [place], [address] Phone: [phone]   Fax:  [phone]  Name: Kellie [name] MRN: [id number] Date of Birth: Apr 12, 1956

## 2019-10-12 ENCOUNTER — Ambulatory Visit: Payer: Medicare HMO

## 2019-10-12 ENCOUNTER — Other Ambulatory Visit: Payer: Self-pay

## 2019-10-12 DIAGNOSIS — M6281 Muscle weakness (generalized): Secondary | ICD-10-CM | POA: Diagnosis not present

## 2019-10-12 DIAGNOSIS — M542 Cervicalgia: Secondary | ICD-10-CM

## 2019-10-12 DIAGNOSIS — M62838 Other muscle spasm: Secondary | ICD-10-CM | POA: Diagnosis not present

## 2019-10-12 DIAGNOSIS — R293 Abnormal posture: Secondary | ICD-10-CM

## 2019-10-12 DIAGNOSIS — R531 Weakness: Secondary | ICD-10-CM | POA: Diagnosis not present

## 2019-10-12 DIAGNOSIS — M546 Pain in thoracic spine: Secondary | ICD-10-CM | POA: Diagnosis not present
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
## 2019-10-15 ENCOUNTER — Ambulatory Visit: Payer: Medicare HMO | Admitting: Physician Assistant

## 2019-10-19 ENCOUNTER — Ambulatory Visit: Payer: Medicare HMO

## 2019-10-26 ENCOUNTER — Ambulatory Visit: Payer: Medicare HMO | Attending: Orthopedic Surgery

## 2019-10-26 ENCOUNTER — Other Ambulatory Visit: Payer: Self-pay

## 2019-10-26 ENCOUNTER — Other Ambulatory Visit: Payer: Self-pay | Admitting: Physician Assistant

## 2019-10-26 ENCOUNTER — Other Ambulatory Visit (HOSPITAL_COMMUNITY): Payer: Self-pay | Admitting: Psychiatry

## 2019-10-26 DIAGNOSIS — M62838 Other muscle spasm: Secondary | ICD-10-CM

## 2019-10-26 DIAGNOSIS — F419 Anxiety disorder, unspecified: Secondary | ICD-10-CM

## 2019-10-26 DIAGNOSIS — M542 Cervicalgia: Secondary | ICD-10-CM

## 2019-10-26 DIAGNOSIS — M810 Age-related osteoporosis without current pathological fracture: Secondary | ICD-10-CM

## 2019-10-26 DIAGNOSIS — R293 Abnormal posture: Secondary | ICD-10-CM

## 2019-10-26 DIAGNOSIS — M6281 Muscle weakness (generalized): Secondary | ICD-10-CM | POA: Diagnosis not present

## 2019-10-26 DIAGNOSIS — F410 Panic disorder [episodic paroxysmal anxiety] without agoraphobia: Secondary | ICD-10-CM

## 2019-10-26 DIAGNOSIS — M546 Pain in thoracic spine: Secondary | ICD-10-CM

## 2019-10-26 NOTE — Therapy (Signed)
[facility] [address], [place], [place], [address] Phone: [phone]   Fax:  [phone]  Physical Therapy Treatment  Patient Details  Name: Kellie [name] MRN: [id number] Date of Birth: Aug 31, 1955 Referring Provider (PT): Arther Abbott, MD    Encounter Date: 10/26/2019  PT End of Session - 10/26/19 0914    Visit Number  12    Authorization Type  [employer]- 12 visits    PT Start Time  0845    PT Stop Time  9509    PT Time Calculation (min)  29 min    Activity Tolerance  Patient tolerated treatment well;No increased pain    Behavior During Therapy  WFL for tasks assessed/performed       Past Medical History:  Diagnosis Date  . Abnormal Pap smear of cervix   . Allergic rhinitis 11/08/2010  . Anxiety   . Asthma   . Bronchitis   . Herpes simplex of female genitalia   . Hyperlipidemia   . Mitral valve prolapse   . Osteoporosis 11/21/2011  . Urticaria     Past Surgical History:  Procedure Laterality Date  . CLOSED MANIPULATION SHOULDER  7&01/2005   x2, 30 days apart  . CLOSED MANIPULATION SHOULDER  2006   Left  . COLONOSCOPY  09/2012   Medium sized external hemorrhoids, few small divertic in left colon, otherwise normal colon (repeat 10 yrs)  . ESOPHAGOGASTRODUODENOSCOPY  09/2012   Normal (Dr. Ardis Hughs)  . LEEP  11/2004    There were no vitals filed for this visit.  Subjective Assessment - 10/26/19 0848    Subjective  I haven't experienced pain like this in a long time-I had to miss last week because of the pain.  Pain goes from my neck all the way down to the back.    Currently in Pain?  Yes    Pain Score  7     Pain Location  Neck    Pain Orientation  Left;Right    Pain Descriptors / Indicators  Aching;Tightness    Pain Type  Acute pain    Pain Radiating Towards  from my neck to my back    Pain Onset  More than a month ago    Pain Frequency  Constant    Aggravating Factors   I just don't know- it just  hurts    Pain Relieving Factors  rest, inactivity, Tens Unit.         [facility] PT Assessment - 10/26/19 0001      Assessment   Medical Diagnosis  Neck pain     Referring Provider (PT)  Arther Abbott, MD     Onset Date/Surgical Date  07/17/19    Hand Dominance  Right    Next MD Visit  09/03/19      Precautions   Precautions  Other (comment)    Precaution Comments  Osteoporosis      Cognition   Overall Cognitive Status  Within Functional Limits for tasks assessed      Observation/Other Assessments   Focus on Therapeutic Outcomes (FOTO)   48% limitation      AROM   Cervical - Right Side Bend  45    Cervical - Left Side Bend  45    Cervical - Right Rotation  70    Cervical - Left Rotation  72                   [facility] Adult PT Treatment/Exercise -  10/26/19 0001      Lumbar Exercises: Aerobic   UBE (Upper Arm Bike)  Level 1x 6 minutes (3/3) PT present to discuss progress      Shoulder Exercises: Seated   Other Seated Exercises  3 way raises: 2x10      Neck Exercises: Stretches   Upper Trapezius Stretch  3 reps;20 seconds    Other Neck Stretches  cervical A/ROM rotation 3x20 seconds               PT Short Term Goals - 10/07/19 4081      PT SHORT TERM GOAL #2   Title  Pt will report atleast 25% reduction in her pain with daily activity.     Status  Achieved        PT Long Term Goals - 10/26/19 0851      PT LONG TERM GOAL #1   Title  Pt will be able to demonstrate good understanding of neck posture throughout her session without forward head/rounded shoulders during her session which will decrease strain on her neck.    Status  Achieved      PT LONG TERM GOAL #2   Title  Pt will have greater than 40 deg of active cervical extension to allow her to look overhead throughout the day.    Baseline  45 met    Status  Achieved      PT LONG TERM GOAL #3   Title  Pt will be able to lift cones into an overhead cabinet x15 reps without increase in neck  strain.    Status  Achieved      PT LONG TERM GOAL #4   Title  Pt will report atleast 75% improvement in her neck pain/stiffness throughout her day in order to improve her quality of life.    Baseline  80% improvement- 2 weeks ago.  Flare-up now so not as much improvement reported    Status  Partially Met      PT LONG TERM GOAL #5   Title  demonstrate ability to maintain upright posture throughout full treatment session due to improved strength and body awareness    Status  Achieved      PT LONG TERM GOAL #6   Title  demonstrate cervical A/ROM rotation to > or = to 70 degrees to improve safety with driving    Status  Achieved            Plan - 10/26/19 0909    Clinical Impression Statement  Pt is ready for D/C to HEP.  Pt had a flare-up over the past 2 weeks with unknown cause and took a break from her exercises.  Pt reports 7/10 widespread pain from her neck to her lumbar spine that is aggravated with all activity.  Pt demonstrates improved postural alignment and awareness requiring minimal to no corrections during today's session.  Pt with improved cervical A/ROM.  Pt will follow-up with MD next week to discuss pain levels.    PT Next Visit Plan  D/C to HEP    PT Home Exercise Plan  Access Code: [email or web address]    Consulted and Agree with Plan of Care  Patient       Patient will benefit from skilled therapeutic intervention in order to improve the following deficits and impairments:     Visit Diagnosis: Other muscle spasm  Cervicalgia  Muscle weakness (generalized)  Abnormal posture  Pain in thoracic spine     Problem List Patient Active Problem  List   Diagnosis Date Noted  . Anterolisthesis 07/28/2019  . Reactive airway disease that is not asthma 04/17/2019  . Plantar fasciitis, right 04/02/2019  . Vitamin D insufficiency 02/16/2019  . Cervical stenosis (uterine cervix) 02/13/2019  . Chronic pain of both ankles 01/15/2019  . Cortical age-related cataract of both  eyes 12/22/2018  . Pinguecula of both eyes 12/22/2018  . Nuclear sclerotic cataract of both eyes 12/22/2018  . History of diverticulitis 09/16/2018  . Vaginal dryness 09/02/2018  . Dermatochalasis of both upper eyelids 03/13/2018  . Periodic limb movement 03/13/2018  . Nocturnal leg cramps 03/13/2018  . Aortic insufficiency 01/10/2018  . Non-restorative sleep 01/10/2018  . Snoring 01/10/2018  . Atypical chest pain 10/22/2017  . Anxiety 10/22/2017  . History of loop electrical excision procedure (LEEP) 07/01/2017  . Unintentional weight loss 05/18/2017  . Elevated LDL cholesterol level 05/18/2017  . Hyperlipidemia 05/18/2017  . Irritable bowel syndrome with both constipation and diarrhea 05/18/2017  . Family history of pancreatic cancer 02/18/2017  . Diarrhea 02/18/2017  . Loss of weight 02/18/2017  . Weakness 10/05/2016  . Panic attacks 09/18/2016  . SOB (shortness of breath) 09/18/2016  . Bipolar 1 disorder with moderate mania ([place]) 09/17/2016  . Pulmonary nodule 08/08/2016  . Hoarseness 03/09/2016  . Left knee pain 11/09/2015  . Chronic pain of both knees 11/09/2015  . Plantar fasciitis, bilateral 11/09/2015  . Cough, persistent 07/21/2015  . Post-nasal drip 07/21/2015  . DDD (degenerative disc disease), cervical 07/05/2015  . Chronic nasal congestion 03/01/2015  . Vitreous floaters of right eye 03/01/2015  . Near syncope 02/09/2015  . Rhinitis, allergic 10/14/2014  . Family history of renal cancer 10/11/2014  . DDD (degenerative disc disease), lumbar 10/11/2014  . Bipolar disorder with depression ([place]) 06/02/2014  . Postmenopausal atrophic vaginitis 03/17/2014  . Prediabetes 12/14/2013  . Encounter for gynecological examination with Papanicolaou smear of cervix 12/08/2013  . Adhesive capsulitis of right shoulder 11/24/2013  . Chronic periodontal disease 06/30/2013  . Genital herpes 11/05/2012  . Hemorrhoids 11/05/2012  . Borderline intellectual functioning 02/15/2012   . Osteoporosis 11/21/2011  . Cough 06/02/2011  . Allergic rhinitis 11/08/2010  . Insomnia 11/08/2010  . GERD (gastroesophageal reflux disease) 09/27/2010    PHYSICAL THERAPY DISCHARGE SUMMARY  Visits from Start of Care: 12  Current functional level related to goals / functional outcomes: See above for current status.  Pt will D/C to HEP   Remaining deficits: Pt with intermittent neck and lumbar pain.  Pt will follow-up with MD to discuss pain.     Education / Equipment: HEP, posture/body mechanics Plan: Patient agrees to discharge.  Patient goals were partially met. Patient is being discharged due to being pleased with the current functional level.  ?????        Sigurd Sos, PT 10/26/19 9:19 AM  [facility] [address] [place], [place], [address] Phone: [phone]   Fax:  [phone]  Name: Kellie [name] MRN: [id number] Date of Birth: 1956/04/30

## 2019-10-27 NOTE — Telephone Encounter (Signed)
Last written 04/28/2019 #30 with 5 refills

## 2019-10-28 ENCOUNTER — Other Ambulatory Visit: Payer: Self-pay | Admitting: Orthopaedic Surgery

## 2019-10-28 DIAGNOSIS — G44319 Acute post-traumatic headache, not intractable: Secondary | ICD-10-CM

## 2019-10-29 MED ORDER — CYCLOBENZAPRINE HCL 10 MG PO TABS: ORAL_TABLET | ORAL | 0 refills | Status: DC

## 2019-11-03 ENCOUNTER — Encounter: Payer: Self-pay | Admitting: Orthopaedic Surgery

## 2019-11-03 ENCOUNTER — Other Ambulatory Visit: Payer: Self-pay

## 2019-11-03 ENCOUNTER — Ambulatory Visit (INDEPENDENT_AMBULATORY_CARE_PROVIDER_SITE_OTHER): Payer: Medicare HMO | Admitting: Orthopaedic Surgery

## 2019-11-03 VITALS — BP 116/73 | HR 75 | Ht 66.0 in | Wt 110.0 lb

## 2019-11-03 DIAGNOSIS — M542 Cervicalgia: Secondary | ICD-10-CM

## 2019-11-03 NOTE — Progress Notes (Signed)
Patient [id number] Kellie [name], female DOB:1955/09/17, 64 y.o. [id number]  Chief Complaint  Patient presents with  . Neck Pain    HPI  Kellie [name] is a 64 y.o. female who has continued neck pain.  She has pain in the right upper trapezius area more. She has been to PT and I have their notes. PT only helped a little. She has no spasm. She has pain most of the time.  Flexeril helps.  I called that in last week.  I want her to continue her exercises at home and stop PT.  Use head and ROM.  She has no numbness.   Body mass index is 17.75 kg/m.  ROS  Review of Systems  Constitutional: Positive for activity change.  Respiratory: Positive for shortness of breath.   Musculoskeletal: Positive for arthralgias, back pain and neck pain.  Neurological: Positive for headaches.  Psychiatric/Behavioral: The patient is nervous/anxious.   All other systems reviewed and are negative.   All other systems reviewed and are negative.  The following is a summary of the past history medically, past history surgically, known current medicines, social history and family history.  This information is gathered electronically by the computer from prior information and documentation.  I review this each visit and have found including this information at this point in the chart is beneficial and informative.    Past Medical History:  Diagnosis Date  . Abnormal Pap smear of cervix   . Allergic rhinitis 11/08/2010  . Anxiety   . Asthma   . Bronchitis   . Herpes simplex of female genitalia   . Hyperlipidemia   . Mitral valve prolapse   . Osteoporosis 11/21/2011  . Urticaria     Past Surgical History:  Procedure Laterality Date  . CLOSED MANIPULATION SHOULDER  7&01/2005   x2, 30 days apart  . CLOSED MANIPULATION SHOULDER  2006   Left  . COLONOSCOPY  09/2012   Medium sized external hemorrhoids, few small divertic in left colon, otherwise normal colon (repeat 10 yrs)  . ESOPHAGOGASTRODUODENOSCOPY  09/2012    Normal (Dr. Ardis Hughs)  . LEEP  11/2004    Family History  Problem Relation Age of Onset  . Kidney cancer Mother 48  . COPD Mother   . Cancer Mother        renal  . Kidney disease Mother   . Allergic rhinitis Mother   . Asthma Mother   . Heart disease Father   . Allergic rhinitis Father   . COPD Sister   . Depression Sister   . Pancreatic cancer Sister   . Cancer Sister        pancreatic  . Heart disease Brother   . Heart attack Brother   . Heart disease Paternal Uncle   . Diabetes Other        neice  . Thyroid disease Other        neice  . Depression Maternal Aunt   . Depression Maternal Grandfather   . Hypothyroidism Sister   . Hypothyroidism Sister   . Colon cancer Neg Hx   . Rectal cancer Neg Hx   . Stomach cancer Neg Hx   . Colon polyps Neg Hx     Social History Social History   Tobacco Use  . Smoking status: Never Smoker  . Smokeless tobacco: Never Used  Substance Use Topics  . Alcohol use: No  . Drug use: No    Allergies  Allergen Reactions  . Hydrocodone Nausea And Vomiting  .  Abilify [Aripiprazole]     Nausea/vomiting/weakness/shaky  . Codeine Nausea And Vomiting  . Morphine And Related Nausea And Vomiting  . Percocet [Oxycodone-Acetaminophen] Nausea And Vomiting and Other (See Comments)    Patient states it makes blood pressure drop too much  . Vicodin [Hydrocodone-Acetaminophen] Nausea And Vomiting    Current Outpatient Medications  Medication Sig Dispense Refill  . acyclovir (ZOVIRAX) 200 MG capsule Take 2 capsules (400 mg total) by mouth 2 (two) times daily. 120 capsule 5  . albuterol (VENTOLIN HFA) 108 (90 Base) MCG/ACT inhaler INHALE 2 PUFFS INTO THE LUNGS EVERY 6 HOURS AS NEEDED FOR SHORTNESS OF BREATH OR WHEEZING. 8.5 g 0  . alendronate (FOSAMAX) 70 MG tablet TAKE 1 TABLET BY MOUTH ONCE A WEEK WITH 8 OUNCE GLASS OF WATER 30 MIN BEFORE BREAKFAST AND OTHER MEDS. DON'T LIE DOWN FOR 30 MINUTES. 4 tablet 0  . ALPRAZolam (XANAX) 0.5 MG tablet  TAKE 1 TABLET ONCE DAILY FOR ANXIETY. 30 tablet 0  . cephALEXin (KEFLEX) 500 MG capsule Take 1 capsule (500 mg total) by mouth 2 (two) times daily. 14 capsule 0  . Cholecalciferol (VITAMIN D) 2000 units CAPS Take by mouth.    . citalopram (CELEXA) 10 MG tablet TAKE (1) TABLET BY MOUTH ONCE DAILY. 30 tablet 0  . conjugated estrogens (PREMARIN) vaginal cream One application three times a week. 30 g 12  . cyclobenzaprine (FLEXERIL) 10 MG tablet One half to one tab PO qHS 30 tablet 0  . diclofenac sodium (VOLTAREN) 1 % GEL Apply 4 g topically 4 (four) times daily. To affected joint. 100 g 1  . gabapentin (NEURONTIN) 100 MG capsule TAKE 1 CAPSULE BY MOUTH FOUR TIMES DAILY. 120 capsule 0  . hydrocortisone-pramoxine (PROCTOFOAM-HC) rectal foam Place 1 applicator rectally 2 (two) times daily. 10 g 1  . ketoconazole (NIZORAL) 2 % shampoo Apply 1 application topically 2 (two) times a week. 120 mL 1  . meloxicam (MOBIC) 15 MG tablet TAKE 1 TABLET IN THE MORNING WITH BREAKFAST FOR 2 WEEKS, THEN DAILY AS NEEDED FOR PAIN. 30 tablet 5  . montelukast (SINGULAIR) 10 MG tablet Take 1 tablet (10 mg total) by mouth at bedtime. 90 tablet 3  . omeprazole (PRILOSEC) 20 MG capsule Take 20 mg by mouth daily.    . ondansetron (ZOFRAN) 4 MG tablet Take 1 tablet (4 mg total) by mouth every 8 (eight) hours as needed for nausea or vomiting. 20 tablet 0  . pantoprazole (PROTONIX) 40 MG tablet TAKE (1) TABLET BY MOUTH ONCE DAILY. 90 tablet 0  . Probiotic Product (PROBIOTIC & ACIDOPHILUS EX ST PO) Take by mouth.    . promethazine (PHENERGAN) 12.5 MG tablet Take 1 tablet (12.5 mg total) by mouth every 6 (six) hours as needed for nausea or vomiting. 8 tablet 0  . Pseudoephedrine-Guaifenesin (MUCINEX D MAX STRENGTH PO) Take by mouth every 12 (twelve) hours as needed.    . valACYclovir (VALTREX) 1000 MG tablet Take 1 tablet (1,000 mg total) by mouth 3 (three) times daily. For 7 days. 21 tablet 0  . fluticasone (FLONASE) 50 MCG/ACT  nasal spray Place 2 sprays into the nose daily. 16 g 3   No current facility-administered medications for this visit.     Physical Exam  Blood pressure 116/73, pulse 75, height 5\' 6"  (1.676 m), weight 110 lb (49.9 kg).  Constitutional: overall normal hygiene, normal nutrition, well developed, normal grooming, normal body habitus. Assistive device:none  Musculoskeletal: gait and station Limp none, muscle tone and  strength are normal, no tremors or atrophy is present.  .  Neurological: coordination overall normal.  Deep tendon reflex/nerve stretch intact.  Sensation normal.  Cranial nerves II-XII intact.   Skin:   Normal overall no scars, lesions, ulcers or rashes. No psoriasis.  Psychiatric: Alert and oriented x 3.  Recent memory intact, remote memory unclear.  Normal mood and affect. Well groomed.  Good eye contact.  Cardiovascular: overall no swelling, no varicosities, no edema bilaterally, normal temperatures of the legs and arms, no clubbing, cyanosis and good capillary refill.  Lymphatic: palpation is normal.  Neck has full ROM.  All other systems reviewed and are negative   The patient has been educated about the nature of the problem(s) and counseled on treatment options.  The patient appeared to understand what I have discussed and is in agreement with it.  Encounter Diagnosis  Name Primary?  . Cervicalgia Yes    PLAN Call if any problems.  Precautions discussed.  Continue current medications.   Return to clinic 1 month   Electronically Signed Sanjuana Kava, MD 5/11/20219:50 AM

## 2019-11-27 ENCOUNTER — Other Ambulatory Visit (HOSPITAL_COMMUNITY): Payer: Self-pay | Admitting: Psychiatry

## 2019-11-27 ENCOUNTER — Other Ambulatory Visit: Payer: Self-pay | Admitting: Physician Assistant

## 2019-11-27 DIAGNOSIS — F410 Panic disorder [episodic paroxysmal anxiety] without agoraphobia: Secondary | ICD-10-CM

## 2019-11-27 DIAGNOSIS — F419 Anxiety disorder, unspecified: Secondary | ICD-10-CM

## 2019-11-27 DIAGNOSIS — M810 Age-related osteoporosis without current pathological fracture: Secondary | ICD-10-CM

## 2019-11-30 ENCOUNTER — Encounter: Payer: Self-pay | Admitting: Physician Assistant

## 2019-11-30 NOTE — Telephone Encounter (Signed)
Lets get her in sooner. Ok to double book so I can see what she means by her voice box. If she is having trouble breathing she needs to go to ED.

## 2019-12-01 NOTE — Telephone Encounter (Signed)
Appt was scheduled for tomorrow at 10:30

## 2019-12-02 ENCOUNTER — Encounter: Payer: Self-pay | Admitting: Physician Assistant

## 2019-12-02 ENCOUNTER — Ambulatory Visit (INDEPENDENT_AMBULATORY_CARE_PROVIDER_SITE_OTHER): Payer: Medicare HMO

## 2019-12-02 ENCOUNTER — Other Ambulatory Visit: Payer: Self-pay

## 2019-12-02 ENCOUNTER — Ambulatory Visit (INDEPENDENT_AMBULATORY_CARE_PROVIDER_SITE_OTHER): Payer: Medicare HMO | Admitting: Physician Assistant

## 2019-12-02 VITALS — BP 111/64 | HR 83 | Ht 66.0 in | Wt 113.0 lb

## 2019-12-02 DIAGNOSIS — K625 Hemorrhage of anus and rectum: Secondary | ICD-10-CM | POA: Diagnosis not present

## 2019-12-02 DIAGNOSIS — K649 Unspecified hemorrhoids: Secondary | ICD-10-CM

## 2019-12-02 DIAGNOSIS — J029 Acute pharyngitis, unspecified: Secondary | ICD-10-CM

## 2019-12-02 DIAGNOSIS — E559 Vitamin D deficiency, unspecified: Secondary | ICD-10-CM | POA: Diagnosis not present

## 2019-12-02 DIAGNOSIS — R1310 Dysphagia, unspecified: Secondary | ICD-10-CM | POA: Insufficient documentation

## 2019-12-02 DIAGNOSIS — M542 Cervicalgia: Secondary | ICD-10-CM

## 2019-12-02 DIAGNOSIS — R4589 Other symptoms and signs involving emotional state: Secondary | ICD-10-CM | POA: Diagnosis not present

## 2019-12-02 DIAGNOSIS — R5383 Other fatigue: Secondary | ICD-10-CM | POA: Diagnosis not present

## 2019-12-02 DIAGNOSIS — Z13220 Encounter for screening for lipoid disorders: Secondary | ICD-10-CM

## 2019-12-02 MED ORDER — HYDROCORTISONE ACETATE 25 MG RE SUPP: 25.0000 mg | Freq: Two times a day (BID) | RECTAL | 1 refills | Status: DC | PRN

## 2019-12-02 NOTE — Progress Notes (Signed)
Subjective:    Patient ID: Kellie [name], female    DOB: 1956-02-03, 64 y.o.   MRN: [id number]  HPI  Patient is a 64 year old female who presents to the clinic with 6 to 8 days of neck tenderness, problems swallowing, feeling like something is stuck in throat.  First sensation started all of a sudden.  She denies any previous fever, chills, sinus pressure, sickness.  She has not swallowed anything. She denies any acid reflux.  She denies any abdominal pain.  She states she put some lotion on her neck every day and when she touched just above her both will cords on the outside and up into the neck it was very tender.  She has noted she has been more hoarse.  She feels like she is having an increasingly difficult time breathing.  She denies any wheezing.  She does have a dry cough if she talks too much. She is already made appointment with the voice lab in [facility] with Dr. Joya Gaskins for evaluation of her vocal cords.  Aleve is not helping her symptoms.  She also endorses bright red blood in stool for the last months.  She has a history of hemorrhoids for many years and thought it was her hemorrhoids.  She denies any hemorrhoid pain.  She has not tried anything to make better.  Last colonoscopy 2014.   .. Active Ambulatory Problems    Diagnosis Date Noted  . GERD (gastroesophageal reflux disease) 09/27/2010  . Allergic rhinitis 11/08/2010  . Insomnia 11/08/2010  . Cough 06/02/2011  . Osteoporosis 11/21/2011  . Borderline intellectual functioning 02/15/2012  . Genital herpes 11/05/2012  . Hemorrhoids 11/05/2012  . Chronic periodontal disease 06/30/2013  . Adhesive capsulitis of right shoulder 11/24/2013  . Encounter for gynecological examination with Papanicolaou smear of cervix 12/08/2013  . Prediabetes 12/14/2013  . Postmenopausal atrophic vaginitis 03/17/2014  . Bipolar disorder with depression ([place]) 06/02/2014  . Family history of renal cancer 10/11/2014  . DDD (degenerative disc  disease), lumbar 10/11/2014  . Rhinitis, allergic 10/14/2014  . Near syncope 02/09/2015  . Chronic nasal congestion 03/01/2015  . Vitreous floaters of right eye 03/01/2015  . DDD (degenerative disc disease), cervical 07/05/2015  . Cough, persistent 07/21/2015  . Post-nasal drip 07/21/2015  . Left knee pain 11/09/2015  . Chronic pain of both knees 11/09/2015  . Plantar fasciitis, bilateral 11/09/2015  . Hoarseness 03/09/2016  . Pulmonary nodule 08/08/2016  . Bipolar 1 disorder with moderate mania ([place]) 09/17/2016  . Panic attacks 09/18/2016  . SOB (shortness of breath) 09/18/2016  . Weakness 10/05/2016  . Family history of pancreatic cancer 02/18/2017  . Diarrhea 02/18/2017  . Loss of weight 02/18/2017  . Unintentional weight loss 05/18/2017  . Elevated LDL cholesterol level 05/18/2017  . Hyperlipidemia 05/18/2017  . Irritable bowel syndrome with both constipation and diarrhea 05/18/2017  . History of loop electrical excision procedure (LEEP) 07/01/2017  . Atypical chest pain 10/22/2017  . Anxiety 10/22/2017  . Aortic insufficiency 01/10/2018  . Non-restorative sleep 01/10/2018  . Snoring 01/10/2018  . Dermatochalasis of both upper eyelids 03/13/2018  . Periodic limb movement 03/13/2018  . Nocturnal leg cramps 03/13/2018  . Vaginal dryness 09/02/2018  . History of diverticulitis 09/16/2018  . Chronic pain of both ankles 01/15/2019  . Cervical stenosis (uterine cervix) 02/13/2019  . Vitamin D insufficiency 02/16/2019  . Plantar fasciitis, right 04/02/2019  . Reactive airway disease that is not asthma 04/17/2019  . Cortical age-related cataract of both eyes 12/22/2018  .  Pinguecula of both eyes 12/22/2018  . Nuclear sclerotic cataract of both eyes 12/22/2018  . Anterolisthesis 07/28/2019   Resolved Ambulatory Problems    Diagnosis Date Noted  . Sinusitis 09/27/2010  . Depression 09/27/2010  . Bronchitis 12/04/2010  . General medical examination 10/18/2011  . Dizziness  11/21/2011  . Knee pain 11/21/2011  . Low back pain 11/21/2011  . Heel pain 07/14/2012  . Thoracic myofascial strain 03/13/2013  . Sinusitis 04/20/2013  . Cough 04/22/2013  . Rotator cuff disorder 08/01/2013  . Right shoulder pain 10/27/2013  . Adhesive capsulitis 11/02/2013  . Pain in joint, shoulder region 11/24/2013  . Bipolar disorder ([place]) 04/29/2014  . Right serous otitis media 08/03/2014  . Acute recurrent maxillary sinusitis 10/14/2014  . Right-sided low back pain without sciatica 10/14/2014  . Sinusitis 02/09/2015  . Sacroiliac joint dysfunction of right side 02/25/2015  . Piriformis syndrome 03/01/2015  . Adhesive capsulitis 07/05/2015  . Radiculitis of right cervical region 07/06/2015  . Drug reaction 09/25/2016  . Mitral valve prolapse 01/10/2018   Past Medical History:  Diagnosis Date  . Abnormal Pap smear of cervix   . Allergic rhinitis 11/08/2010  . Asthma   . Herpes simplex of female genitalia   . Urticaria        Review of Systems See HPI.     Objective:   Physical Exam Vitals reviewed.  Constitutional:      Appearance: Normal appearance.  HENT:     Head: Normocephalic.     Nose: Nose normal.     Mouth/Throat:     Mouth: Mucous membranes are moist.     Comments: No visible obstruction. Neck:     Comments: No stridor in neck.  No palpable swollen lypmph nodes.  Tenderness of bilateral anterior cervical neck to palpation.  No warmth or swelling.  No enlargement of thyroid.  Cardiovascular:     Rate and Rhythm: Normal rate and regular rhythm.     Pulses: Normal pulses.  Pulmonary:     Effort: Pulmonary effort is normal. No respiratory distress.     Breath sounds: Normal breath sounds. No stridor. No wheezing, rhonchi or rales.  Chest:     Chest wall: No tenderness.  Abdominal:     General: Bowel sounds are normal. There is no distension.     Palpations: Abdomen is soft.     Tenderness: There is no abdominal tenderness. There is no  guarding.  Musculoskeletal:     Cervical back: Neck supple.  Neurological:     General: No focal deficit present.     Mental Status: She is alert and oriented to person, place, and time.  Psychiatric:     Comments: Flat affect.            Assessment & Plan:  [name] KitchenMarland KitchenYalda was seen today for oral swelling.  Diagnoses and all orders for this visit:  Dysphagia, unspecified type -     US Soft Tissue Head/Neck (NON-THYROID)  Rectal bleeding -     CBC with Differential/Platelet -     Ambulatory referral to Gastroenterology  No energy -     TSH -     CBC with Differential/Platelet -     COMPLETE METABOLIC PANEL WITH GFR -     Ferritin  Depressed mood -     TSH -     CBC with Differential/Platelet -     COMPLETE METABOLIC PANEL WITH GFR -     Ferritin  Vitamin D deficiency -  VITAMIN D 25 Hydroxy (Vit-D Deficiency, Fractures)  Screening for lipid disorders -     Lipid Panel w/reflex Direct LDL  Tenderness of neck -     US Soft Tissue Head/Neck (NON-THYROID)  Sore throat -     US Soft Tissue Head/Neck (NON-THYROID)  Hemorrhoids, unspecified hemorrhoid type -     hydrocortisone (ANUSOL-HC) 25 MG suppository; Place 1 suppository (25 mg total) rectally 2 (two) times daily as needed for hemorrhoids. -     Ambulatory referral to Gastroenterology   Patient already has herself scheduled for the voice center in [place] to look at her larynx and vocal cords.  She does have quite a bit of tenderness in the anterior cervical lymph node area.  I do not feel any significant lymph node enlargement.  She is having problems swallowing.  I will order t a neck ultrasound today for evaluation. Does not appear to be any reflux. She does not appear to have any URI. Pulse ox is 100 percent and no visible distress.   Patient is also feeling extremely rundown and tired.  Will check CBC, vitamin D, CMP, thyroid.  Patient is also having rectal bleeding daily for the last few months.  It is  bright red blood.  She has had hemorrhoids for many years.  She thought it was just her hemorrhoids.  She denies any hemorrhoid itching or pain.  She is not taking anything for her hemorrhoids.  Her last colonoscopy was 2014.  Discussed bowel health.  Gave her suppositories for hemorrhoids.  Will refer her back out to GI for a repeat colonoscopy and evaluation.

## 2019-12-03 ENCOUNTER — Encounter: Payer: Self-pay | Admitting: Physician Assistant

## 2019-12-03 ENCOUNTER — Ambulatory Visit: Payer: Medicare HMO | Admitting: Orthopaedic Surgery

## 2019-12-03 DIAGNOSIS — E559 Vitamin D deficiency, unspecified: Secondary | ICD-10-CM | POA: Diagnosis not present

## 2019-12-03 DIAGNOSIS — R4589 Other symptoms and signs involving emotional state: Secondary | ICD-10-CM | POA: Diagnosis not present

## 2019-12-03 DIAGNOSIS — K625 Hemorrhage of anus and rectum: Secondary | ICD-10-CM | POA: Diagnosis not present

## 2019-12-03 DIAGNOSIS — R5383 Other fatigue: Secondary | ICD-10-CM | POA: Diagnosis not present

## 2019-12-03 DIAGNOSIS — Z13220 Encounter for screening for lipoid disorders: Secondary | ICD-10-CM | POA: Diagnosis not present

## 2019-12-03 DIAGNOSIS — E7800 Pure hypercholesterolemia, unspecified: Secondary | ICD-10-CM | POA: Diagnosis not present

## 2019-12-03 LAB — CBC WITH DIFFERENTIAL/PLATELET
Absolute Monocytes: 360 cells/uL (ref 200–950)
Basophils Absolute: 40 cells/uL (ref 0–200)
Basophils Relative: 1 %
Eosinophils Absolute: 92 cells/uL (ref 15–500)
Eosinophils Relative: 2.3 %
HCT: 38.9 % (ref 35.0–45.0)
Hemoglobin: 13 g/dL (ref 11.7–15.5)
Lymphs Abs: 1220 cells/uL (ref 850–3900)
MCH: 30.7 pg (ref 27.0–33.0)
MCHC: 33.4 g/dL (ref 32.0–36.0)
MCV: 92 fL (ref 80.0–100.0)
MPV: 11.3 fL (ref 7.5–12.5)
Monocytes Relative: 9 %
Neutro Abs: 2288 cells/uL (ref 1500–7800)
Neutrophils Relative %: 57.2 %
Platelets: 318 10*3/uL (ref 140–400)
RBC: 4.23 10*6/uL (ref 3.80–5.10)
RDW: 12.1 % (ref 11.0–15.0)
Total Lymphocyte: 30.5 %
WBC: 4 10*3/uL (ref 3.8–10.8)

## 2019-12-03 LAB — LIPID PANEL W/REFLEX DIRECT LDL
Cholesterol: 247 mg/dL — ABNORMAL HIGH (ref <200)
HDL: 100 mg/dL (ref >=50)
LDL Cholesterol (Calc): 135 mg/dL (calc) — ABNORMAL HIGH
Non-HDL Cholesterol (Calc): 147 mg/dL (calc) — ABNORMAL HIGH (ref <130)
Total CHOL/HDL Ratio: 2.5 (calc) (ref <5.0)
Triglycerides: 41 mg/dL (ref <150)

## 2019-12-03 LAB — TSH: TSH: 1.4 mIU/L (ref 0.40–4.50)

## 2019-12-03 LAB — FERRITIN: Ferritin: 41 ng/mL (ref 16–288)

## 2019-12-03 LAB — COMPLETE METABOLIC PANEL WITH GFR
AG Ratio: 3 (calc) — ABNORMAL HIGH (ref 1.0–2.5)
ALT: 12 U/L (ref 6–29)
AST: 19 U/L (ref 10–35)
Albumin: 5.1 g/dL (ref 3.6–5.1)
Alkaline phosphatase (APISO): 44 U/L (ref 37–153)
BUN: 21 mg/dL (ref 7–25)
CO2: 27 mmol/L (ref 20–32)
Calcium: 10.1 mg/dL (ref 8.6–10.4)
Chloride: 103 mmol/L (ref 98–110)
Creat: 0.89 mg/dL (ref 0.50–0.99)
GFR, Est African American: 79 mL/min/{1.73_m2} (ref >=60)
GFR, Est Non African American: 68 mL/min/{1.73_m2} (ref >=60)
Globulin: 1.7 g/dL (calc) — ABNORMAL LOW (ref 1.9–3.7)
Glucose, Bld: 89 mg/dL (ref 65–99)
Potassium: 4.9 mmol/L (ref 3.5–5.3)
Sodium: 140 mmol/L (ref 135–146)
Total Bilirubin: 0.5 mg/dL (ref 0.2–1.2)
Total Protein: 6.8 g/dL (ref 6.1–8.1)

## 2019-12-03 LAB — VITAMIN D 25 HYDROXY (VIT D DEFICIENCY, FRACTURES): Vit D, 25-Hydroxy: 60 ng/mL (ref 30–100)

## 2019-12-04 ENCOUNTER — Other Ambulatory Visit: Payer: Self-pay | Admitting: Neurology

## 2019-12-04 ENCOUNTER — Telehealth: Payer: Self-pay | Admitting: Physician Assistant

## 2019-12-04 DIAGNOSIS — R899 Unspecified abnormal finding in specimens from other organs, systems and tissues: Secondary | ICD-10-CM

## 2019-12-04 DIAGNOSIS — R1310 Dysphagia, unspecified: Secondary | ICD-10-CM

## 2019-12-04 NOTE — Progress Notes (Signed)
Wynema,   Thyroid is normal and in perfect range. WBC normal. Hemoglobin is normal. Sugar, kidney and liver look great. Vitamin D looks great. Cholesterol is stable. HDL amazing. Your globulin is just a hair low. I suspect this could be you are not eating enough protein. Increase protein and recheck in 4 weeks. Drink meal replacements if you have too.

## 2019-12-04 NOTE — Progress Notes (Signed)
Yoshiye,   U/S of neck is negative for any abnormality. We need to wait until ENT can look at vocal cords. I would take aleve regularly for any inflammation until you can see ENT.

## 2019-12-04 NOTE — Telephone Encounter (Signed)
I am not sure what she is speaking of.

## 2019-12-04 NOTE — Telephone Encounter (Signed)
Received fax from [facility] and they are denying coverage on Hydrocortisone AC I am placing in providers box for review. - CF

## 2019-12-07 ENCOUNTER — Ambulatory Visit: Payer: Medicare HMO | Admitting: Physician Assistant

## 2019-12-09 DIAGNOSIS — J383 Other diseases of vocal cords: Secondary | ICD-10-CM | POA: Diagnosis not present

## 2019-12-09 DIAGNOSIS — E119 Type 2 diabetes mellitus without complications: Secondary | ICD-10-CM | POA: Diagnosis not present

## 2019-12-09 DIAGNOSIS — J384 Edema of larynx: Secondary | ICD-10-CM | POA: Diagnosis not present

## 2019-12-17 DIAGNOSIS — J383 Other diseases of vocal cords: Secondary | ICD-10-CM | POA: Diagnosis not present

## 2019-12-17 DIAGNOSIS — R490 Dysphonia: Secondary | ICD-10-CM | POA: Diagnosis not present

## 2019-12-23 DIAGNOSIS — E1136 Type 2 diabetes mellitus with diabetic cataract: Secondary | ICD-10-CM | POA: Diagnosis not present

## 2019-12-23 DIAGNOSIS — H0288B Meibomian gland dysfunction left eye, upper and lower eyelids: Secondary | ICD-10-CM | POA: Diagnosis not present

## 2019-12-23 DIAGNOSIS — H25013 Cortical age-related cataract, bilateral: Secondary | ICD-10-CM | POA: Diagnosis not present

## 2019-12-23 DIAGNOSIS — H04123 Dry eye syndrome of bilateral lacrimal glands: Secondary | ICD-10-CM | POA: Diagnosis not present

## 2019-12-23 DIAGNOSIS — H52203 Unspecified astigmatism, bilateral: Secondary | ICD-10-CM | POA: Diagnosis not present

## 2019-12-23 DIAGNOSIS — H02831 Dermatochalasis of right upper eyelid: Secondary | ICD-10-CM | POA: Diagnosis not present

## 2019-12-23 DIAGNOSIS — H524 Presbyopia: Secondary | ICD-10-CM | POA: Diagnosis not present

## 2019-12-23 DIAGNOSIS — H5203 Hypermetropia, bilateral: Secondary | ICD-10-CM | POA: Diagnosis not present

## 2019-12-23 DIAGNOSIS — H0288A Meibomian gland dysfunction right eye, upper and lower eyelids: Secondary | ICD-10-CM | POA: Diagnosis not present

## 2019-12-23 DIAGNOSIS — H2513 Age-related nuclear cataract, bilateral: Secondary | ICD-10-CM | POA: Diagnosis not present

## 2019-12-28 ENCOUNTER — Other Ambulatory Visit: Payer: Self-pay | Admitting: Physician Assistant

## 2019-12-28 ENCOUNTER — Other Ambulatory Visit (HOSPITAL_COMMUNITY): Payer: Self-pay | Admitting: Psychiatry

## 2019-12-28 DIAGNOSIS — M810 Age-related osteoporosis without current pathological fracture: Secondary | ICD-10-CM

## 2020-01-04 ENCOUNTER — Telehealth (HOSPITAL_COMMUNITY): Payer: Self-pay | Admitting: Psychiatry

## 2020-01-04 MED ORDER — GABAPENTIN 100 MG PO CAPS: 100.0000 mg | ORAL_CAPSULE | Freq: Four times a day (QID) | ORAL | 0 refills | Status: DC

## 2020-01-04 NOTE — Telephone Encounter (Signed)
sent 

## 2020-01-04 NOTE — Telephone Encounter (Signed)
Pt needs refill on gabapentin  [place]    cb [phone]

## 2020-01-11 ENCOUNTER — Encounter: Payer: Self-pay | Admitting: Physician Assistant

## 2020-01-11 DIAGNOSIS — R071 Chest pain on breathing: Secondary | ICD-10-CM

## 2020-01-11 NOTE — Telephone Encounter (Signed)
Routing to provider. Unsure what amb referral to order.

## 2020-01-12 ENCOUNTER — Ambulatory Visit (INDEPENDENT_AMBULATORY_CARE_PROVIDER_SITE_OTHER): Payer: Medicare HMO

## 2020-01-12 ENCOUNTER — Other Ambulatory Visit: Payer: Self-pay

## 2020-01-12 DIAGNOSIS — R071 Chest pain on breathing: Secondary | ICD-10-CM | POA: Diagnosis not present

## 2020-01-12 DIAGNOSIS — J449 Chronic obstructive pulmonary disease, unspecified: Secondary | ICD-10-CM | POA: Diagnosis not present

## 2020-01-12 DIAGNOSIS — R0789 Other chest pain: Secondary | ICD-10-CM | POA: Diagnosis not present

## 2020-01-13 ENCOUNTER — Encounter: Payer: Self-pay | Admitting: Physician Assistant

## 2020-01-13 DIAGNOSIS — J989 Respiratory disorder, unspecified: Secondary | ICD-10-CM

## 2020-01-13 DIAGNOSIS — R0989 Other specified symptoms and signs involving the circulatory and respiratory systems: Secondary | ICD-10-CM

## 2020-01-13 DIAGNOSIS — R071 Chest pain on breathing: Secondary | ICD-10-CM

## 2020-01-13 DIAGNOSIS — R0602 Shortness of breath: Secondary | ICD-10-CM

## 2020-01-13 MED ORDER — BUDESONIDE-FORMOTEROL FUMARATE 160-4.5 MCG/ACT IN AERO
2.0000 | INHALATION_SPRAY | Freq: Two times a day (BID) | RESPIRATORY_TRACT | 3 refills | Status: DC
Start: 2020-01-13 — End: 2021-07-06

## 2020-01-13 NOTE — Progress Notes (Signed)
Kellie [name],   Your lungs are hyperinflated and trapping air like we see in COPD which could be the cause of some of your chest symptoms. Your last breathing test was 03/2019 and normal. We could try an inhaler to help open you up daily or repeat lung test.

## 2020-01-15 DIAGNOSIS — R0989 Other specified symptoms and signs involving the circulatory and respiratory systems: Secondary | ICD-10-CM | POA: Insufficient documentation

## 2020-01-15 NOTE — Addendum Note (Signed)
Addended by: Donella Stade on: 01/15/2020 06:54 AM   Modules accepted: Orders

## 2020-01-19 ENCOUNTER — Other Ambulatory Visit: Payer: Self-pay

## 2020-01-19 ENCOUNTER — Telehealth: Payer: Self-pay | Admitting: Pulmonary Disease

## 2020-01-19 ENCOUNTER — Ambulatory Visit (INDEPENDENT_AMBULATORY_CARE_PROVIDER_SITE_OTHER): Payer: Medicare HMO | Admitting: Pulmonary Disease

## 2020-01-19 ENCOUNTER — Encounter: Payer: Self-pay | Admitting: Pulmonary Disease

## 2020-01-19 VITALS — BP 86/52 | HR 76 | Temp 98.8°F | Ht 66.0 in | Wt 113.0 lb

## 2020-01-19 DIAGNOSIS — R0602 Shortness of breath: Secondary | ICD-10-CM

## 2020-01-19 DIAGNOSIS — K219 Gastro-esophageal reflux disease without esophagitis: Secondary | ICD-10-CM | POA: Diagnosis not present

## 2020-01-19 MED ORDER — AEROCHAMBER MV MISC
0 refills | Status: DC
Start: 2020-01-19 — End: 2022-12-28

## 2020-01-19 MED ORDER — ALBUTEROL SULFATE HFA 108 (90 BASE) MCG/ACT IN AERS: 2.0000 | INHALATION_SPRAY | RESPIRATORY_TRACT | 11 refills | Status: DC | PRN

## 2020-01-19 NOTE — Patient Instructions (Addendum)
Nice to meet you!  Use symbicort 2 puffs twice a day with spacer. Please rinse out your mouth after every use.  We are using this to treat the hyperinflation that I believe is likely related to undiagnosed asthma.  Use Protonix 1 pill twice a day to treat reflux symptoms.  I referred you back to Dr. Ardis Hughs with GI (gastroenterologist) to address my concerns with uncontrolled reflux symptoms.  We will see you back in 3 months with Dr. Silas Flood.

## 2020-01-19 NOTE — Telephone Encounter (Signed)
Called [facility] regarding what was sent to them as nothing was ordered for a home health company. They verified they were calling regarding the spacer, patient was sent home with one from our office. Told lady to cancel RX  Nothing further needed at this time.

## 2020-01-19 NOTE — Progress Notes (Signed)
Patient ID: Kellie [name], female    DOB: [birth year], 64 y.o.   MRN: [id number]  Chief Complaint  Patient presents with   Consult    chest tightness and shortenss of breath worse at night worse after she eats    Referring provider: Donella Stade, PA-C  HPI:  Kellie [name] is a 64 year old with PMH of GERD, VCD, and anxiety whom we are seeing in consultation at the request of [name], PA-C for evaluation of shortness of breath.  Patient describes many months of shortness of breath. Often associated with exertion and now is much less active than prior. However, SOB occurs at rest as well. She describes multiple severe episodes of SOB. Sometimes associated with chest tightness. Symptoms seem to resolve on their own after many minutes. She has episodes of severe chest pressure/pain that seems to make breathing worse. She has used albuterol PRN with improved SOB. However, it causes her to have palpitations and feel jittery so she does not like to use it. Prescribed Symbicort but does not want to use because she is concerned it will affect her vocal cords. Can be worse at night. Notes history of seasonal allergies.   She has significant chest pressure/pain that exacerbates her SOB. Associated with meals and worse when lying supine. This affects her sleep, poor sleep last several months.Taking Protonix daily but mostly in the evenings. No chest pain with exertion. She has been very fatigues as well. Little energy. PCP office has been working up symptoms (note reviewed) and ordered labs that showed normal TSH and a CXR. Results of image reviewed and personal interpretation as follows: Clear lungs, hyperinflated with 11 ribs counted on PA film, increase size of lucency between anterior chest wall and heart. Reviewed several chest films dating back to 2010 all with similar findings on my interpretation.  PMH: anxiety, VCD Surgical history: reports heart valve prolapse surgery - can not confrim in  medical record Family History: COPD in sister Social History: Never smoker, usually active outdoors  Imaging: DG Chest 2 View  Result Date: 01/12/2020 CLINICAL DATA:  Chest discomfort EXAM: CHEST - 2 VIEW COMPARISON:  05/05/2018 FINDINGS: The lungs are mildly hyperinflated in keeping with changes of underlying COPD. This appears stable since prior examination. The lungs are clear. No pneumothorax or pleural effusion. Cardiac size within normal limits. Pulmonary vascularity is normal. No acute bone abnormality. IMPRESSION: Stable mild pulmonary hyperinflation in keeping with COPD. Electronically Signed   By: Fidela Salisbury MD   On: 01/12/2020 21:45    Lab Results: Personally reviewed, Abs Eos < 100  CBC    Component Value Date/Time   WBC 4.0 12/03/2019 0814   RBC 4.23 12/03/2019 0814   HGB 13.0 12/03/2019 0814   HCT 38.9 12/03/2019 0814   PLT 318 12/03/2019 0814   MCV 92.0 12/03/2019 0814   MCH 30.7 12/03/2019 0814   MCHC 33.4 12/03/2019 0814   RDW 12.1 12/03/2019 0814   LYMPHSABS 1,220 12/03/2019 0814   MONOABS 0.7 02/08/2018 0559   EOSABS 92 12/03/2019 0814   BASOSABS 40 12/03/2019 0814    BMET    Component Value Date/Time   NA 140 12/03/2019 0814   K 4.9 12/03/2019 0814   CL 103 12/03/2019 0814   CO2 27 12/03/2019 0814   GLUCOSE 89 12/03/2019 0814   BUN 21 12/03/2019 0814   CREATININE 0.89 12/03/2019 0814   CALCIUM 10.1 12/03/2019 0814   GFRNONAA 68 12/03/2019 0814   GFRAA  79 12/03/2019 0814    BNP No results found for: BNP  ProBNP No results found for: PROBNP  Specialty Problems      Pulmonary Problems   Allergic rhinitis   Cough   Rhinitis, allergic   Chronic nasal congestion   Cough, persistent   Pulmonary nodule    Less than 28mm CT done 07/2016. Follow up in 1 year.       SOB (shortness of breath)   Snoring   Reactive airway disease that is not asthma   Hyperinflation of lungs      Allergies  Allergen Reactions   Hydrocodone Nausea And  Vomiting   Abilify [Aripiprazole]     Nausea/vomiting/weakness/shaky   Codeine Nausea And Vomiting   Morphine And Related Nausea And Vomiting   Percocet [Oxycodone-Acetaminophen] Nausea And Vomiting and Other (See Comments)    Patient states it makes blood pressure drop too much   Vicodin [Hydrocodone-Acetaminophen] Nausea And Vomiting    Immunization History  Administered Date(s) Administered   Tdap 06/25/2005    Past Medical History:  Diagnosis Date   Abnormal Pap smear of cervix    Allergic rhinitis 11/08/2010   Anxiety    Asthma    Bronchitis    Herpes simplex of female genitalia    Hyperlipidemia    Mitral valve prolapse    Osteoporosis 11/21/2011   Urticaria     Tobacco History: Social History   Tobacco Use  Smoking Status Never Smoker  Smokeless Tobacco Never Used   Counseling given: Not Answered     Outpatient Encounter Medications as of 01/19/2020  Medication Sig   acyclovir (ZOVIRAX) 200 MG capsule Take 2 capsules (400 mg total) by mouth 2 (two) times daily.   albuterol (VENTOLIN HFA) 108 (90 Base) MCG/ACT inhaler Inhale 2 puffs into the lungs every 4 (four) hours as needed for wheezing or shortness of breath.   alendronate (FOSAMAX) 70 MG tablet TAKE 1 TABLET BY MOUTH ONCE A WEEK WITH 8 OUNCE GLASS OF WATER 30 MIN BEFORE BREAKFAST AND OTHER MEDS. DON'T LIE DOWN FOR 30 MINUTES.   Cholecalciferol (VITAMIN D) 2000 units CAPS Take by mouth.   citalopram (CELEXA) 10 MG tablet TAKE (1) TABLET BY MOUTH ONCE DAILY.   gabapentin (NEURONTIN) 100 MG capsule Take 1 capsule (100 mg total) by mouth 4 (four) times daily.   hydrocortisone (ANUSOL-HC) 25 MG suppository Place 1 suppository (25 mg total) rectally 2 (two) times daily as needed for hemorrhoids.   meloxicam (MOBIC) 15 MG tablet TAKE 1 TABLET IN THE MORNING WITH BREAKFAST FOR 2 WEEKS, THEN DAILY AS NEEDED FOR PAIN.   pantoprazole (PROTONIX) 40 MG tablet TAKE (1) TABLET BY MOUTH ONCE DAILY.    [DISCONTINUED] albuterol (VENTOLIN HFA) 108 (90 Base) MCG/ACT inhaler INHALE 2 PUFFS INTO THE LUNGS EVERY 6 HOURS AS NEEDED FOR SHORTNESS OF BREATH OR WHEEZING.   ALPRAZolam (XANAX) 0.5 MG tablet TAKE 1 TABLET ONCE DAILY FOR ANXIETY. (Patient not taking: Reported on 01/19/2020)   budesonide-formoterol (SYMBICORT) 160-4.5 MCG/ACT inhaler Inhale 2 puffs into the lungs 2 (two) times daily. (Patient not taking: Reported on 01/19/2020)   cyclobenzaprine (FLEXERIL) 10 MG tablet One half to one tab PO qHS (Patient not taking: Reported on 01/19/2020)   Probiotic Product (PROBIOTIC & ACIDOPHILUS EX ST PO) Take by mouth. (Patient not taking: Reported on 01/19/2020)   Spacer/Aero-Holding Chambers (AEROCHAMBER MV) inhaler Use as instructed   [DISCONTINUED] cephALEXin (KEFLEX) 500 MG capsule Take 1 capsule (500 mg total) by mouth 2 (two)  times daily.   [DISCONTINUED] conjugated estrogens (PREMARIN) vaginal cream One application three times a week. (Patient not taking: Reported on 01/19/2020)   [DISCONTINUED] diclofenac sodium (VOLTAREN) 1 % GEL Apply 4 g topically 4 (four) times daily. To affected joint. (Patient not taking: Reported on 01/19/2020)   [DISCONTINUED] fluticasone (FLONASE) 50 MCG/ACT nasal spray Place 2 sprays into the nose daily.   [DISCONTINUED] hydrocortisone-pramoxine (PROCTOFOAM-HC) rectal foam Place 1 applicator rectally 2 (two) times daily.   [DISCONTINUED] ketoconazole (NIZORAL) 2 % shampoo Apply 1 application topically 2 (two) times a week.   [DISCONTINUED] montelukast (SINGULAIR) 10 MG tablet Take 1 tablet (10 mg total) by mouth at bedtime.   [DISCONTINUED] omeprazole (PRILOSEC) 20 MG capsule Take 20 mg by mouth daily.   [DISCONTINUED] ondansetron (ZOFRAN) 4 MG tablet Take 1 tablet (4 mg total) by mouth every 8 (eight) hours as needed for nausea or vomiting.   [DISCONTINUED] promethazine (PHENERGAN) 12.5 MG tablet Take 1 tablet (12.5 mg total) by mouth every 6 (six) hours as  needed for nausea or vomiting.   [DISCONTINUED] Pseudoephedrine-Guaifenesin (MUCINEX D MAX STRENGTH PO) Take by mouth every 12 (twelve) hours as needed.   [DISCONTINUED] valACYclovir (VALTREX) 1000 MG tablet Take 1 tablet (1,000 mg total) by mouth 3 (three) times daily. For 7 days.   No facility-administered encounter medications on file as of 01/19/2020.     Review of Systems  Review of Systems  No orthopnea or PND. No LE edema. No dysphagia. Comprehensive review of systems is otherwise negative.   Physical Exam  BP (!) 86/52 (BP Location: Right Arm, Cuff Size: Normal)    Pulse 76    Temp 98.8 F (37.1 C) (Oral)    Ht 5\' 6"  (1.676 m)    Wt 113 lb (51.3 kg)    SpO2 99%    BMI 18.24 kg/m   Wt Readings from Last 5 Encounters:  01/19/20 113 lb (51.3 kg)  12/02/19 113 lb (51.3 kg)  11/03/19 110 lb (49.9 kg)  09/29/19 110 lb (49.9 kg)  09/01/19 111 lb 6 oz (50.5 kg)    BMI Readings from Last 5 Encounters:  01/19/20 18.24 kg/m  12/02/19 18.24 kg/m  11/03/19 17.75 kg/m  09/29/19 17.75 kg/m  09/01/19 17.98 kg/m     Physical Exam General: thin, well appearing, in NAD Eyes: EOMI, no icterus Mouth: lips C/D/I, MMM Pulmonary: CTAB, NWOB on RA, no wheeze or crackle Cardiovascular: RRR, no murmurs, no LE edema Abdomen/GI: non tender, BS present Musculoskeletal: no synovitis, no joint deformity Neuro: normal gait, CN intact Psych: AAOx3, normal mood, full affect   Assessment & Plan:   Ms. Guisinger is a 64 year old with PMH of GERD, VCD, and anxiety whom we are seeing in consultation at the request of [name], PA-C for evaluation of shortness of breath.  Previous PFTs reviewed. No fixed obstruction. No response to bronchodilator. Increase in FEF25-75 difficult to interpret but in right clinical scenario could support asthma. She has no fixed obstruction to suggest emphysema or obliterative bronchiolitis as culprit for CXR findings. Her CXR is stable and similar appearing  dating to 2010. Possible the hyperinflation is a normal anatomical variant rather than product of disease. Recommend pursuing lung volumes in future for physiologic assessment.   Suspect her SOB is multifactorial. Some atopy and intermittent nature could be consistent with asthma. Albuterol improves symptoms as has prednisone in past. Start Symbicort 2 puffs BID with spacer. Albuterol refilled.  She describes symptoms consistent with reflux and  possible esophogeal dysmotility with corkscrew like central chest pain/pressure worse after meals and lying down. Possible silent aspiration events contributing. Recommend she re-establish with prior GI doctor for further evaluation. Increase PPI to BID today.    Return in about 3 months (around 04/20/2020).   Lanier Clam, MD 01/19/2020   This appointment required 90 minutes of patient care (this includes precharting, chart review, review of results, face-to-face care, etc.).

## 2020-01-19 NOTE — Progress Notes (Signed)
conslt

## 2020-01-21 ENCOUNTER — Encounter: Payer: Self-pay | Admitting: Gastroenterology

## 2020-01-21 ENCOUNTER — Telehealth: Payer: Self-pay | Admitting: Pulmonary Disease

## 2020-01-21 DIAGNOSIS — R0602 Shortness of breath: Secondary | ICD-10-CM

## 2020-01-21 NOTE — Telephone Encounter (Signed)
Called and spoke with pt. Pt is requesting to have a CT performed now instead of having it repeated in 1 year due to now beginning to have more problems with her breathing. Pt stated she has had increased chest tightness and has also had to use her symbicort. Due to the changes in her symptoms, Dr. Silas Flood, please advise if you are okay with pt having a CT performed. Pt wants to have this done at [facility].

## 2020-01-21 NOTE — Telephone Encounter (Signed)
Called and spoke with pt letting her know that Dr. Silas Flood stated it was okay for [place] to order CT and she verbalized understanding. Order has been placed. Nothing further needed.

## 2020-01-25 ENCOUNTER — Ambulatory Visit (INDEPENDENT_AMBULATORY_CARE_PROVIDER_SITE_OTHER): Payer: Medicare HMO

## 2020-01-25 ENCOUNTER — Other Ambulatory Visit: Payer: Medicare HMO

## 2020-01-25 ENCOUNTER — Telehealth: Payer: Self-pay | Admitting: Pulmonary Disease

## 2020-01-25 ENCOUNTER — Other Ambulatory Visit: Payer: Self-pay

## 2020-01-25 DIAGNOSIS — K449 Diaphragmatic hernia without obstruction or gangrene: Secondary | ICD-10-CM | POA: Diagnosis not present

## 2020-01-25 DIAGNOSIS — R0602 Shortness of breath: Secondary | ICD-10-CM

## 2020-01-25 NOTE — Telephone Encounter (Signed)
Pt calling back about this-- saying that we have not called in to approve insurance for upcoming appt.

## 2020-01-25 NOTE — Telephone Encounter (Signed)
This was just scheduled this morning & given to [facility] for prior auth.  When I called the patient, she advised that she rescheduled for today.

## 2020-01-26 ENCOUNTER — Encounter: Payer: Self-pay | Admitting: Physician Assistant

## 2020-01-27 NOTE — Telephone Encounter (Signed)
Kellie [name], can you help pt get a sooner appointment? Thanks.

## 2020-01-27 NOTE — Telephone Encounter (Signed)
Patient is scheduled tomorrow for CT. - CF

## 2020-01-28 ENCOUNTER — Other Ambulatory Visit: Payer: Medicare HMO

## 2020-01-30 ENCOUNTER — Other Ambulatory Visit: Payer: Self-pay | Admitting: Physician Assistant

## 2020-01-30 ENCOUNTER — Other Ambulatory Visit (HOSPITAL_COMMUNITY): Payer: Self-pay | Admitting: Psychiatry

## 2020-01-30 DIAGNOSIS — M810 Age-related osteoporosis without current pathological fracture: Secondary | ICD-10-CM

## 2020-02-15 ENCOUNTER — Ambulatory Visit (INDEPENDENT_AMBULATORY_CARE_PROVIDER_SITE_OTHER): Payer: Medicare HMO | Admitting: Physician Assistant

## 2020-02-15 ENCOUNTER — Encounter: Payer: Self-pay | Admitting: Physician Assistant

## 2020-02-15 ENCOUNTER — Other Ambulatory Visit: Payer: Self-pay

## 2020-02-15 VITALS — BP 106/62 | HR 79 | Wt 112.1 lb

## 2020-02-15 DIAGNOSIS — D582 Other hemoglobinopathies: Secondary | ICD-10-CM

## 2020-02-15 DIAGNOSIS — K449 Diaphragmatic hernia without obstruction or gangrene: Secondary | ICD-10-CM

## 2020-02-15 DIAGNOSIS — Z0000 Encounter for general adult medical examination without abnormal findings: Secondary | ICD-10-CM

## 2020-02-15 DIAGNOSIS — R252 Cramp and spasm: Secondary | ICD-10-CM

## 2020-02-15 DIAGNOSIS — E559 Vitamin D deficiency, unspecified: Secondary | ICD-10-CM | POA: Diagnosis not present

## 2020-02-15 DIAGNOSIS — Z8639 Personal history of other endocrine, nutritional and metabolic disease: Secondary | ICD-10-CM

## 2020-02-15 DIAGNOSIS — K2100 Gastro-esophageal reflux disease with esophagitis, without bleeding: Secondary | ICD-10-CM

## 2020-02-15 DIAGNOSIS — R0602 Shortness of breath: Secondary | ICD-10-CM | POA: Diagnosis not present

## 2020-02-15 MED ORDER — PREDNISONE 50 MG PO TABS: ORAL_TABLET | ORAL | 0 refills | Status: DC

## 2020-02-15 MED ORDER — ERGOCALCIFEROL 1.25 MG (50000 UT) PO CAPS: 50000.0000 [IU] | ORAL_CAPSULE | ORAL | 3 refills | Status: DC

## 2020-02-15 MED ORDER — PANTOPRAZOLE SODIUM 40 MG PO TBEC: DELAYED_RELEASE_TABLET | ORAL | 3 refills | Status: DC

## 2020-02-15 NOTE — Patient Instructions (Addendum)
Health Maintenance After Age 65 After age 65, you are at a higher risk for certain long-term diseases and infections as well as injuries from falls. Falls are a major cause of broken bones and head injuries in people who are older than age 65. Getting regular preventive care can help to keep you healthy and well. Preventive care includes getting regular testing and making lifestyle changes as recommended by your health care provider. Talk with your health care provider about:  Which screenings and tests you should have. A screening is a test that checks for a disease when you have no symptoms.  A diet and exercise plan that is right for you. What should I know about screenings and tests to prevent falls? Screening and testing are the best ways to find a health problem early. Early diagnosis and treatment give you the best chance of managing medical conditions that are common after age 65. Certain conditions and lifestyle choices may make you more likely to have a fall. Your health care provider may recommend:  Regular vision checks. Poor vision and conditions such as cataracts can make you more likely to have a fall. If you wear glasses, make sure to get your prescription updated if your vision changes.  Medicine review. Work with your health care provider to regularly review all of the medicines you are taking, including over-the-counter medicines. Ask your health care provider about any side effects that may make you more likely to have a fall. Tell your health care provider if any medicines that you take make you feel dizzy or sleepy.  Osteoporosis screening. Osteoporosis is a condition that causes the bones to get weaker. This can make the bones weak and cause them to break more easily.  Blood pressure screening. Blood pressure changes and medicines to control blood pressure can make you feel dizzy.  Strength and balance checks. Your health care provider may recommend certain tests to check your  strength and balance while standing, walking, or changing positions.  Foot health exam. Foot pain and numbness, as well as not wearing proper footwear, can make you more likely to have a fall.  Depression screening. You may be more likely to have a fall if you have a fear of falling, feel emotionally low, or feel unable to do activities that you used to do.  Alcohol use screening. Using too much alcohol can affect your balance and may make you more likely to have a fall. What actions can I take to lower my risk of falls? General instructions  Talk with your health care provider about your risks for falling. Tell your health care provider if: ? You fall. Be sure to tell your health care provider about all falls, even ones that seem minor. ? You feel dizzy, sleepy, or off-balance.  Take over-the-counter and prescription medicines only as told by your health care provider. These include any supplements.  Eat a healthy diet and maintain a healthy weight. A healthy diet includes low-fat dairy products, low-fat (lean) meats, and fiber from whole grains, beans, and lots of fruits and vegetables. Home safety  Remove any tripping hazards, such as rugs, cords, and clutter.  Install safety equipment such as grab bars in bathrooms and safety rails on stairs.  Keep rooms and walkways well-lit. Activity   Follow a regular exercise program to stay fit. This will help you maintain your balance. Ask your health care provider what types of exercise are appropriate for you.  If you need a cane or   walker, use it as recommended by your health care provider.  Wear supportive shoes that have nonskid soles. Lifestyle  Do not drink alcohol if your health care provider tells you not to drink.  If you drink alcohol, limit how much you have: ? 0-1 drink a day for women. ? 0-2 drinks a day for men.  Be aware of how much alcohol is in your drink. In the [place] one drink equals one typical bottle of beer (12  oz), one-half glass of wine (5 oz), or one shot of hard liquor (1 oz).  Do not use any products that contain nicotine or tobacco, such as cigarettes and e-cigarettes. If you need help quitting, ask your health care provider. Summary  Having a healthy lifestyle and getting preventive care can help to protect your health and wellness after age 54.  Screening and testing are the best way to find a health problem early and help you avoid having a fall. Early diagnosis and treatment give you the best chance for managing medical conditions that are more common for people who are older than age 70.  Falls are a major cause of broken bones and head injuries in people who are older than age 74. Take precautions to prevent a fall at home.  Work with your health care provider to learn what changes you can make to improve your health and wellness and to prevent falls. This information is not intended to replace advice given to you by your health care provider. Make sure you discuss any questions you have with your health care provider. Document Revised: 10/02/2018 Document Reviewed: 04/24/2017 Elsevier Patient Education  [place].   Hiatal Hernia  A hiatal hernia occurs when part of the stomach slides above the muscle that separates the abdomen from the chest (diaphragm). A person can be born with a hiatal hernia (congenital), or it may develop over time. In almost all cases of hiatal hernia, only the top part of the stomach pushes through the diaphragm. Many people have a hiatal hernia with no symptoms. The larger the hernia, the more likely it is that you will have symptoms. In some cases, a hiatal hernia allows stomach acid to flow back into the tube that carries food from your mouth to your stomach (esophagus). This may cause heartburn symptoms. Severe heartburn symptoms may mean that you have developed a condition called gastroesophageal reflux disease (GERD). What are the causes? This  condition is caused by a weakness in the opening (hiatus) where the esophagus passes through the diaphragm to attach to the upper part of the stomach. A person may be born with a weakness in the hiatus, or a weakness can develop over time. What increases the risk? This condition is more likely to develop in:  Older people. Age is a major risk factor for a hiatal hernia, especially if you are over the age of 53.  Pregnant women.  People who are overweight.  People who have frequent constipation. What are the signs or symptoms? Symptoms of this condition usually develop in the form of GERD symptoms. Symptoms include:  Heartburn.  Belching.  Indigestion.  Trouble swallowing.  Coughing or wheezing.  Sore throat.  Hoarseness.  Chest pain.  Nausea and vomiting. How is this diagnosed? This condition may be diagnosed during testing for GERD. Tests that may be done include:  X-rays of your stomach or chest.  An upper gastrointestinal (GI) series. This is an X-ray exam of your GI tract that is taken  after you swallow a chalky liquid that shows up clearly on the X-ray.  Endoscopy. This is a procedure to look into your stomach using a thin, flexible tube that has a tiny camera and light on the end of it. How is this treated? This condition may be treated by:  Dietary and lifestyle changes to help reduce GERD symptoms.  Medicines. These may include: ? Over-the-counter antacids. ? Medicines that make your stomach empty more quickly. ? Medicines that block the production of stomach acid (H2 blockers). ? Stronger medicines to reduce stomach acid (proton pump inhibitors).  Surgery to repair the hernia, if other treatments are not helping. If you have no symptoms, you may not need treatment. Follow these instructions at home: Lifestyle and activity  Do not use any products that contain nicotine or tobacco, such as cigarettes and e-cigarettes. If you need help quitting, ask your  health care provider.  Try to achieve and maintain a healthy body weight.  Avoid putting pressure on your abdomen. Anything that puts pressure on your abdomen increases the amount of acid that may be pushed up into your esophagus. ? Avoid bending over, especially after eating. ? Raise the head of your bed by putting blocks under the legs. This keeps your head and esophagus higher than your stomach. ? Do not wear tight clothing around your chest or stomach. ? Try not to strain when having a bowel movement, when urinating, or when lifting heavy objects. Eating and drinking  Avoid foods that can worsen GERD symptoms. These may include: ? Fatty foods, like fried foods. ? Citrus fruits, like oranges or lemon. ? Other foods and drinks that contain acid, like orange juice or tomatoes. ? Spicy food. ? Chocolate.  Eat frequent small meals instead of three large meals a day. This helps prevent your stomach from getting too full. ? Eat slowly. ? Do not lie down right after eating. ? Do not eat 1-2 hours before bed.  Do not drink beverages with caffeine. These include cola, coffee, cocoa, and tea.  Do not drink alcohol. General instructions  Take over-the-counter and prescription medicines only as told by your health care provider.  Keep all follow-up visits as told by your health care provider. This is important. Contact a health care provider if:  Your symptoms are not controlled with medicines or lifestyle changes.  You are having trouble swallowing.  You have coughing or wheezing that will not go away. Get help right away if:  Your pain is getting worse.  Your pain spreads to your arms, neck, jaw, teeth, or back.  You have shortness of breath.  You sweat for no reason.  You feel sick to your stomach (nauseous) or you vomit.  You vomit blood.  You have bright red blood in your stools.  You have black, tarry stools. This information is not intended to replace advice given  to you by your health care provider. Make sure you discuss any questions you have with your health care provider. Document Revised: 05/24/2017 Document Reviewed: 01/14/2017 Elsevier Patient Education  [place].

## 2020-02-16 ENCOUNTER — Encounter (HOSPITAL_COMMUNITY): Payer: Self-pay | Admitting: Psychiatry

## 2020-02-16 ENCOUNTER — Telehealth (INDEPENDENT_AMBULATORY_CARE_PROVIDER_SITE_OTHER): Payer: Medicare HMO | Admitting: Psychiatry

## 2020-02-16 DIAGNOSIS — R634 Abnormal weight loss: Secondary | ICD-10-CM

## 2020-02-16 DIAGNOSIS — K2100 Gastro-esophageal reflux disease with esophagitis, without bleeding: Secondary | ICD-10-CM | POA: Diagnosis not present

## 2020-02-16 DIAGNOSIS — F319 Bipolar disorder, unspecified: Secondary | ICD-10-CM

## 2020-02-16 DIAGNOSIS — D582 Other hemoglobinopathies: Secondary | ICD-10-CM | POA: Diagnosis not present

## 2020-02-16 DIAGNOSIS — F419 Anxiety disorder, unspecified: Secondary | ICD-10-CM | POA: Diagnosis not present

## 2020-02-16 DIAGNOSIS — R252 Cramp and spasm: Secondary | ICD-10-CM | POA: Diagnosis not present

## 2020-02-16 DIAGNOSIS — R0602 Shortness of breath: Secondary | ICD-10-CM | POA: Diagnosis not present

## 2020-02-16 DIAGNOSIS — Z8639 Personal history of other endocrine, nutritional and metabolic disease: Secondary | ICD-10-CM | POA: Diagnosis not present

## 2020-02-16 MED ORDER — GABAPENTIN 100 MG PO CAPS
100.0000 mg | ORAL_CAPSULE | Freq: Four times a day (QID) | ORAL | 0 refills | Status: DC
Start: 2020-02-16 — End: 2020-03-31

## 2020-02-16 MED ORDER — CITALOPRAM HYDROBROMIDE 10 MG PO TABS
ORAL_TABLET | ORAL | 0 refills | Status: DC
Start: 2020-02-16 — End: 2020-03-31

## 2020-02-16 NOTE — Progress Notes (Signed)
Patient ID: Kellie [name], female   DOB: December 23, 1955, 64 y.o.   MRN: [id number]   [place] Follow up Outpatient visit  Kellie [name] [id number] 64 y.o.  02/16/2020 8:45 AM  Chief Complaint:   Follow up for Bipolar  And anxiety.  History of Present Illness:    I connected with Kellie [name] on 02/16/20 at  8:30 AM EDT by telephone and verified that I am speaking with the correct person using two identifiers.  I discussed the limitations, risks, security and privacy concerns of performing an evaluation and management service by telephone and the availability of in person appointments. I also discussed with the patient that there may be a patient responsible charge related to this service. The patient expressed understanding and agreed to proceed.  Patient location home Provider location : home office  Mood wise fair, not depressed, gaba keeps balance More concern of low weight, says will get endocrinology referral to rule out relationship if any to thyroid Eats well, sleep fairs  Tolerating meds,  On small dose of xanax also on flexeril   Modifying factor: family Duration more then 5 years  Past Psychiatric History/Hospitalization(s) denies  Hospitalization for psychiatric illness: No History of Electroconvulsive Shock Therapy: No Prior Suicide Attempts: No  Medical History; Past Medical History:  Diagnosis Date  . Abnormal Pap smear of cervix   . Allergic rhinitis 11/08/2010  . Anxiety   . Asthma   . Bronchitis   . Herpes simplex of female genitalia   . Hyperlipidemia   . Mitral valve prolapse   . Osteoporosis 11/21/2011  . Urticaria     Allergies: Allergies  Allergen Reactions  . Hydrocodone Nausea And Vomiting  . Abilify [Aripiprazole]     Nausea/vomiting/weakness/shaky  . Codeine Nausea And Vomiting  . Morphine And Related Nausea And Vomiting  . Percocet [Oxycodone-Acetaminophen] Nausea And Vomiting and Other (See Comments)    Patient  states it makes blood pressure drop too much  . Vicodin [Hydrocodone-Acetaminophen] Nausea And Vomiting    Medications: Outpatient Encounter Medications as of 02/16/2020  Medication Sig  . acyclovir (ZOVIRAX) 200 MG capsule Take 2 capsules (400 mg total) by mouth 2 (two) times daily.  [name] Kitchen albuterol (VENTOLIN HFA) 108 (90 Base) MCG/ACT inhaler Inhale 2 puffs into the lungs every 4 (four) hours as needed for wheezing or shortness of breath.  [name] Kitchen alendronate (FOSAMAX) 70 MG tablet TAKE 1 TABLET BY MOUTH ONCE A WEEK WITH 8 OUNCE GLASS OF WATER 30 MIN BEFORE BREAKFAST AND OTHER MEDS. DON'T LIE DOWN FOR 30 MINUTES.  [name] Kitchen ALPRAZolam (XANAX) 0.5 MG tablet TAKE 1 TABLET ONCE DAILY FOR ANXIETY. (Patient not taking: Reported on 02/15/2020)  . budesonide-formoterol (SYMBICORT) 160-4.5 MCG/ACT inhaler Inhale 2 puffs into the lungs 2 (two) times daily.  . Cholecalciferol (VITAMIN D) 2000 units CAPS Take by mouth.  . citalopram (CELEXA) 10 MG tablet One daily  . cyclobenzaprine (FLEXERIL) 10 MG tablet One half to one tab PO qHS  . ergocalciferol (VITAMIN D2) 1.25 MG (50000 UT) capsule Take 1 capsule (50,000 Units total) by mouth once a week.  . gabapentin (NEURONTIN) 100 MG capsule Take 1 capsule (100 mg total) by mouth 4 (four) times daily.  . hydrocortisone (ANUSOL-HC) 25 MG suppository Place 1 suppository (25 mg total) rectally 2 (two) times daily as needed for hemorrhoids.  . meloxicam (MOBIC) 15 MG tablet TAKE 1 TABLET IN THE MORNING WITH BREAKFAST FOR 2 WEEKS, THEN DAILY AS NEEDED FOR PAIN.  [name] Kitchen  pantoprazole (PROTONIX) 40 MG tablet Take one tablet daily.  . predniSONE (DELTASONE) 50 MG tablet One tab PO daily for 5 days.  . Probiotic Product (PROBIOTIC & ACIDOPHILUS EX ST PO) Take by mouth.   . Spacer/Aero-Holding Chambers (AEROCHAMBER MV) inhaler Use as instructed  . [DISCONTINUED] citalopram (CELEXA) 10 MG tablet TAKE (1) TABLET BY MOUTH ONCE DAILY. (Patient not taking: Reported on 02/15/2020)  . [DISCONTINUED]  gabapentin (NEURONTIN) 100 MG capsule TAKE 1 CAPSULE BY MOUTH FOUR TIMES DAILY.   No facility-administered encounter medications on file as of 02/16/2020.     Family History; Family History  Problem Relation Age of Onset  . Kidney cancer Mother 107  . COPD Mother   . Cancer Mother        renal  . Kidney disease Mother   . Allergic rhinitis Mother   . Asthma Mother   . Heart disease Father   . Allergic rhinitis Father   . COPD Sister   . Depression Sister   . Pancreatic cancer Sister   . Cancer Sister        pancreatic  . Heart disease Brother   . Heart attack Brother   . Heart disease Paternal Uncle   . Diabetes Other        neice  . Thyroid disease Other        neice  . Depression Maternal Aunt   . Depression Maternal Grandfather   . Hypothyroidism Sister   . Hypothyroidism Sister   . Colon cancer Neg Hx   . Rectal cancer Neg Hx   . Stomach cancer Neg Hx   . Colon polyps Neg Hx        Labs:  Recent Results (from the past 2160 hour(s))  TSH     Status: None   Collection Time: 12/03/19  8:14 AM  Result Value Ref Range   TSH 1.40 0.40 - 4.50 mIU/L  CBC with Differential/Platelet     Status: None   Collection Time: 12/03/19  8:14 AM  Result Value Ref Range   WBC 4.0 3.8 - 10.8 Thousand/uL   RBC 4.23 3.80 - 5.10 Million/uL   Hemoglobin 13.0 11.7 - 15.5 g/dL   HCT 38.9 35 - 45 %   MCV 92.0 80.0 - 100.0 fL   MCH 30.7 27.0 - 33.0 pg   MCHC 33.4 32.0 - 36.0 g/dL   RDW 12.1 11.0 - 15.0 %   Platelets 318 140 - 400 Thousand/uL   MPV 11.3 7.5 - 12.5 fL   Neutro Abs 2,288 1,500 - 7,800 cells/uL   Lymphs Abs 1,220 850 - 3,900 cells/uL   Absolute Monocytes 360 200 - 950 cells/uL   Eosinophils Absolute 92 15 - 500 cells/uL   Basophils Absolute 40 0 - 200 cells/uL   Neutrophils Relative % 57.2 %   Total Lymphocyte 30.5 %   Monocytes Relative 9.0 %   Eosinophils Relative 2.3 %   Basophils Relative 1.0 %  COMPLETE METABOLIC PANEL WITH GFR     Status: Abnormal    Collection Time: 12/03/19  8:14 AM  Result Value Ref Range   Glucose, Bld 89 65 - 99 mg/dL    Comment: .            Fasting reference interval .    BUN 21 7 - 25 mg/dL   Creat 0.89 0.50 - 0.99 mg/dL    Comment: For patients >2 years of age, the reference limit for Creatinine is approximately 13% higher for people  identified as African-American. .    GFR, Est Non African American 68 > OR = 60 mL/min/1.63m2   GFR, Est African American 79 > OR = 60 mL/min/1.20m2   BUN/Creatinine Ratio NOT APPLICABLE 6 - 22 (calc)   Sodium 140 135 - 146 mmol/L   Potassium 4.9 3.5 - 5.3 mmol/L   Chloride 103 98 - 110 mmol/L   CO2 27 20 - 32 mmol/L   Calcium 10.1 8.6 - 10.4 mg/dL   Total Protein 6.8 6.1 - 8.1 g/dL   Albumin 5.1 3.6 - 5.1 g/dL   Globulin 1.7 (L) 1.9 - 3.7 g/dL (calc)   AG Ratio 3.0 (H) 1.0 - 2.5 (calc)   Total Bilirubin 0.5 0.2 - 1.2 mg/dL   Alkaline phosphatase (APISO) 44 37 - 153 U/L   AST 19 10 - 35 U/L   ALT 12 6 - 29 U/L  Ferritin     Status: None   Collection Time: 12/03/19  8:14 AM  Result Value Ref Range   Ferritin 41 16 - 288 ng/mL  VITAMIN D 25 Hydroxy (Vit-D Deficiency, Fractures)     Status: None   Collection Time: 12/03/19  8:14 AM  Result Value Ref Range   Vit D, 25-Hydroxy 60 30 - 100 ng/mL    Comment: Vitamin D Status         25-OH Vitamin D: . Deficiency:                    <20 ng/mL Insufficiency:             20 - 29 ng/mL Optimal:                 > or = 30 ng/mL . For 25-OH Vitamin D testing on patients on  D2-supplementation and patients for whom quantitation  of D2 and D3 fractions is required, the [facility](TM) 25-OH VIT D, (D2,D3), LC/MS/MS is recommended: order  code [id number] (patients >22yrs). See Note 1 . Note 1 . For additional information, please refer to  http://education.[facility].com/faq/FAQ199  (This link is being provided for informational/ educational purposes only.)   Lipid Panel w/reflex Direct LDL     Status: Abnormal    Collection Time: 12/03/19  8:14 AM  Result Value Ref Range   Cholesterol 247 (H) <200 mg/dL   HDL 100 > OR = 50 mg/dL   Triglycerides 41 <150 mg/dL   LDL Cholesterol (Calc) 135 (H) mg/dL (calc)    Comment: Reference range: <100 . Desirable range <100 mg/dL for primary prevention;   <70 mg/dL for patients with CHD or diabetic patients  with > or = 2 CHD risk factors. [name] Kitchen LDL-C is now calculated using the Martin-Hopkins  calculation, which is a validated novel method providing  better accuracy than the Friedewald equation in the  estimation of LDL-C.  Cresenciano Genre et al. Annamaria Helling. 6045;409(81): 2061-2068  (http://education.[facility].com/faq/FAQ164)    Total CHOL/HDL Ratio 2.5 <5.0 (calc)   Non-HDL Cholesterol (Calc) 147 (H) <130 mg/dL (calc)    Comment: For patients with diabetes plus 1 major ASCVD risk  factor, treating to a non-HDL-C goal of <100 mg/dL  (LDL-C of <70 mg/dL) is considered a therapeutic  option.      Mental Status Examination;   Psychiatric Specialty Exam: Physical Exam  Review of Systems  Cardiovascular: Negative for palpitations.  Musculoskeletal: Positive for back pain.  Skin: Negative for rash.  Psychiatric/Behavioral: Negative for suicidal ideas.    There were no vitals taken for this  visit.There is no height or weight on file to calculate BMI.  General Appearance:   Eye Contact::    Speech:  coherent  Volume:  Normal  Mood: fair  Affect: reactive  Thought Process: clear . No psychosis  Orientation:  Full (Time, Place, and Person)  Thought Content:  Rumination  Suicidal Thoughts:  No  Homicidal Thoughts:  No  Memory:  Immediate;   Fair Recent;   Fair  Judgement:  Fair  Insight:  Shallow  Psychomotor Activity:  Increased  Concentration:  Fair  Recall:  Fair  Akathisia:  Negative  Handed:  Right  AIMS (if indicated):     Assets:  Desire for Improvement Physical Health Transportation  Sleep:        Assessment: Axis I: bipolar  disorder II unspecified or depressed type. Anxiety disorder NOS. Insomnia  Axis II: deferred  Axis III:  Past Medical History:  Diagnosis Date  . Abnormal Pap smear of cervix   . Allergic rhinitis 11/08/2010  . Anxiety   . Asthma   . Bronchitis   . Herpes simplex of female genitalia   . Hyperlipidemia   . Mitral valve prolapse   . Osteoporosis 11/21/2011  . Urticaria     Axis IV: psychosocial   Treatment Plan and Summary:  Bipolar : fair, continue gabapentin  GAD: worries related to weight, continue gaba and celexa for anxiety, she plans to get endo referral  Insomnia: fair. .. Work on sleep hygiene Reviewed meds    I discussed the assessment and treatment plan with the patient. The patient was provided an opportunity to ask questions and all were answered. The patient agreed with the plan and demonstrated an understanding of the instructions.   The patient was advised to call back or seek an in-person evaluation if the symptoms worsen or if the condition fails to improve as anticipated. Fu 3 - 36m.  Merian Capron, MD 02/16/2020

## 2020-02-17 ENCOUNTER — Encounter: Payer: Self-pay | Admitting: Physician Assistant

## 2020-02-17 LAB — COMPLETE METABOLIC PANEL WITH GFR
AG Ratio: 2.3 (calc) (ref 1.0–2.5)
ALT: 11 U/L (ref 6–29)
AST: 17 U/L (ref 10–35)
Albumin: 4.6 g/dL (ref 3.6–5.1)
Alkaline phosphatase (APISO): 45 U/L (ref 37–153)
BUN: 20 mg/dL (ref 7–25)
CO2: 30 mmol/L (ref 20–32)
Calcium: 9.7 mg/dL (ref 8.6–10.4)
Chloride: 103 mmol/L (ref 98–110)
Creat: 0.79 mg/dL (ref 0.50–0.99)
GFR, Est African American: 91 mL/min/{1.73_m2} (ref >=60)
GFR, Est Non African American: 79 mL/min/{1.73_m2} (ref >=60)
Globulin: 2 g/dL (calc) (ref 1.9–3.7)
Glucose, Bld: 83 mg/dL (ref 65–99)
Potassium: 4.4 mmol/L (ref 3.5–5.3)
Sodium: 140 mmol/L (ref 135–146)
Total Bilirubin: 0.5 mg/dL (ref 0.2–1.2)
Total Protein: 6.6 g/dL (ref 6.1–8.1)

## 2020-02-17 LAB — MAGNESIUM: Magnesium: 2.1 mg/dL (ref 1.5–2.5)

## 2020-02-17 LAB — IRON,TIBC AND FERRITIN PANEL
%SAT: 28 % (calc) (ref 16–45)
Ferritin: 43 ng/mL (ref 16–288)
Iron: 95 ug/dL (ref 45–160)
TIBC: 336 mcg/dL (calc) (ref 250–450)

## 2020-02-17 LAB — SEDIMENTATION RATE: Sed Rate: 9 mm/h (ref 0–30)

## 2020-02-17 LAB — C-REACTIVE PROTEIN: CRP: 0.6 mg/L (ref <8.0)

## 2020-02-17 MED ORDER — PREDNISONE 20 MG PO TABS: 20.0000 mg | ORAL_TABLET | Freq: Two times a day (BID) | ORAL | 0 refills | Status: DC

## 2020-02-17 NOTE — Addendum Note (Signed)
Addended by: Donella Stade on: 02/17/2020 12:39 PM   Modules accepted: Orders

## 2020-02-17 NOTE — Progress Notes (Signed)
Kellie [name],   Your ESR is low and great. Inflammation is body is low. CRP still pending.  Kidney, liver, glucose look great.  Your protein is back in normal range!!! [place] job.  Iron level is good.  Your iron stores are normal but could still eat iron rich foods and take a supplement to try to get this a little higher.  Magnesium perfect.   Labs look really good.

## 2020-02-17 NOTE — Progress Notes (Signed)
CRP is normal as well.

## 2020-02-19 DIAGNOSIS — K449 Diaphragmatic hernia without obstruction or gangrene: Secondary | ICD-10-CM | POA: Insufficient documentation

## 2020-02-19 DIAGNOSIS — K2100 Gastro-esophageal reflux disease with esophagitis, without bleeding: Secondary | ICD-10-CM | POA: Insufficient documentation

## 2020-02-19 DIAGNOSIS — D582 Other hemoglobinopathies: Secondary | ICD-10-CM | POA: Insufficient documentation
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
## 2020-03-08 ENCOUNTER — Encounter: Payer: Self-pay | Admitting: Gastroenterology

## 2020-03-08 ENCOUNTER — Ambulatory Visit (INDEPENDENT_AMBULATORY_CARE_PROVIDER_SITE_OTHER): Payer: Medicare HMO | Admitting: Gastroenterology

## 2020-03-08 VITALS — BP 98/58 | HR 82 | Ht 68.0 in | Wt 112.0 lb

## 2020-03-08 DIAGNOSIS — K219 Gastro-esophageal reflux disease without esophagitis: Secondary | ICD-10-CM

## 2020-03-08 DIAGNOSIS — R1310 Dysphagia, unspecified: Secondary | ICD-10-CM

## 2020-03-08 DIAGNOSIS — K625 Hemorrhage of anus and rectum: Secondary | ICD-10-CM

## 2020-03-08 DIAGNOSIS — R142 Eructation: Secondary | ICD-10-CM

## 2020-03-08 MED ORDER — PANTOPRAZOLE SODIUM 40 MG PO TBEC: 40.0000 mg | DELAYED_RELEASE_TABLET | Freq: Two times a day (BID) | ORAL | 3 refills | Status: DC

## 2020-03-08 NOTE — Patient Instructions (Addendum)
If you are age 65 or older, your body mass index should be between 23-30. Your Body mass index is 17.03 kg/m. If this is out of the aforementioned range listed, please consider follow up with your Primary Care Provider.  If you are age 76 or younger, your body mass index should be between 19-25. Your Body mass index is 17.03 kg/m. If this is out of the aformentioned range listed, please consider follow up with your Primary Care Provider.   We have sent the following medications to your pharmacy for you to pick up at your convenience: Pantoprazole 40 mg twice daily 30-60 minutes before breakfast and dinner.   You have been scheduled for an endoscopy. Please follow written instructions given to you at your visit today. If you use inhalers (even only as needed), please bring them with you on the day of your procedure.  Due to recent changes in healthcare laws, you may see the results of your imaging and laboratory studies on MyChart before your provider has had a chance to review them.  We understand that in some cases there may be results that are confusing or concerning to you. Not all laboratory results come back in the same time frame and the provider may be waiting for multiple results in order to interpret others.  Please give [place] 48 hours in order for your provider to thoroughly review all the results before contacting the office for clarification of your results.

## 2020-03-08 NOTE — Progress Notes (Signed)
03/08/2020 Kellie [name] [id number] 06-05-1956   HISTORY OF PRESENT ILLNESS:  This is a 64 year old female who is a patient of Dr. Ardis Hughs, last seen in 2014 at which time she had an EGD and colonoscopy.  EGD 09/2012 was normal.  Colonoscopy 09/2012 showed medium sized external hemorrhoids and a few diverticuli in the left colon.  She is here today with complaints of acid reflux related issues.  She reports a raspy voice, a lot of belching, and occasional trouble with swallowing solid food.  She says that all of this has been present for the past couple of months.  Says that she feels like something is "raw" in there.  Has not been on any acid reflux type medication.  She says that he hemorrhoids bleed on occasion when they are irritated.  She describes this as bright red blood on the toilet paper.  She uses hemorrhoid cream/suppositories prn.   Past Medical History:  Diagnosis Date  . Abnormal Pap smear of cervix   . Allergic rhinitis 11/08/2010  . Anxiety   . Asthma   . Bronchitis   . Herpes simplex of female genitalia   . Hyperlipidemia   . Mitral valve prolapse   . Osteoporosis 11/21/2011  . Urticaria    Past Surgical History:  Procedure Laterality Date  . CLOSED MANIPULATION SHOULDER  7&01/2005   x2, 30 days apart  . CLOSED MANIPULATION SHOULDER  2006   Left  . COLONOSCOPY  09/2012   Medium sized external hemorrhoids, few small divertic in left colon, otherwise normal colon (repeat 10 yrs)  . ESOPHAGOGASTRODUODENOSCOPY  09/2012   Normal (Dr. Ardis Hughs)  . LEEP  11/2004    reports that she has never smoked. She has never used smokeless tobacco. She reports that she does not drink alcohol and does not use drugs. family history includes Allergic rhinitis in her father and mother; Asthma in her mother; COPD in her mother and sister; Depression in her maternal aunt, maternal grandfather, and sister; Diabetes in an other family member; Heart attack in her brother; Heart disease in  her brother, father, and paternal uncle; Hypothyroidism in her sister and sister; Kidney cancer (age of onset: 27) in her mother; Kidney disease in her mother; Pancreatic cancer in her sister; Thyroid disease in an other family member. Allergies  Allergen Reactions  . Hydrocodone Nausea And Vomiting  . Abilify [Aripiprazole]     Nausea/vomiting/weakness/shaky  . Codeine Nausea And Vomiting  . Morphine And Related Nausea And Vomiting  . Percocet [Oxycodone-Acetaminophen] Nausea And Vomiting and Other (See Comments)    Patient states it makes blood pressure drop too much  . Vicodin [Hydrocodone-Acetaminophen] Nausea And Vomiting      Outpatient Encounter Medications as of 03/08/2020  Medication Sig  . acyclovir (ZOVIRAX) 200 MG capsule Take 2 capsules (400 mg total) by mouth 2 (two) times daily.  [name] Kitchen albuterol (VENTOLIN HFA) 108 (90 Base) MCG/ACT inhaler Inhale 2 puffs into the lungs every 4 (four) hours as needed for wheezing or shortness of breath.  [name] Kitchen alendronate (FOSAMAX) 70 MG tablet TAKE 1 TABLET BY MOUTH ONCE A WEEK WITH 8 OUNCE GLASS OF WATER 30 MIN BEFORE BREAKFAST AND OTHER MEDS. DON'T LIE DOWN FOR 30 MINUTES.  [name] Kitchen ALPRAZolam (XANAX) 0.5 MG tablet TAKE 1 TABLET ONCE DAILY FOR ANXIETY.  . budesonide-formoterol (SYMBICORT) 160-4.5 MCG/ACT inhaler Inhale 2 puffs into the lungs 2 (two) times daily.  . Cholecalciferol (VITAMIN D) 2000 units CAPS Take by mouth.  [name] Kitchen  citalopram (CELEXA) 10 MG tablet One daily  . cyclobenzaprine (FLEXERIL) 10 MG tablet One half to one tab PO qHS  . ergocalciferol (VITAMIN D2) 1.25 MG (50000 UT) capsule Take 1 capsule (50,000 Units total) by mouth once a week.  . gabapentin (NEURONTIN) 100 MG capsule Take 1 capsule (100 mg total) by mouth 4 (four) times daily.  . hydrocortisone (ANUSOL-HC) 25 MG suppository Place 1 suppository (25 mg total) rectally 2 (two) times daily as needed for hemorrhoids.  . meloxicam (MOBIC) 15 MG tablet TAKE 1 TABLET IN THE MORNING WITH  BREAKFAST FOR 2 WEEKS, THEN DAILY AS NEEDED FOR PAIN.  [name] Kitchen pantoprazole (PROTONIX) 40 MG tablet Take one tablet daily.  . predniSONE (DELTASONE) 20 MG tablet Take 1 tablet (20 mg total) by mouth 2 (two) times daily with a meal. For 5 days.  . Probiotic Product (PROBIOTIC & ACIDOPHILUS EX ST PO) Take by mouth.   . Spacer/Aero-Holding Chambers (AEROCHAMBER MV) inhaler Use as instructed   No facility-administered encounter medications on file as of 03/08/2020.     REVIEW OF SYSTEMS  : All other systems reviewed and negative except where noted in the History of Present Illness.   PHYSICAL EXAM: BP (!) 98/58   Pulse 82   Ht 5\' 8"  (1.727 m)   Wt 112 lb (50.8 kg)   BMI 17.03 kg/m  General: Well developed white female in no acute distress Head: Normocephalic and atraumatic Eyes:  Sclerae anicteric, conjunctiva pink. Ears: Normal auditory acuity Lungs: Clear throughout to auscultation; no W/R/R. Heart: Regular rate and rhythm; no M/R/G. Abdomen: Soft, non-distended.  BS present.  Non-tender. Musculoskeletal: Symmetrical with no gross deformities  Skin: No lesions on visible extremities Extremities: No edema  Neurological: Alert oriented x 4, grossly non-focal Psychological:  Alert and cooperative. Normal mood and affect  ASSESSMENT AND PLAN: *GERD, belching, intermittent dysphagia, raspy voice:  She has not been on any PPI.  Will start pantoprazole 40 mg daily.  Prescription sent to pharmacy.  We will tentatively plan for EGD as well but she would like to see how she does with the medication first and if she feels much better then she will call us and let us know and possibly cancel her procedure.  Will schedule with Dr. Ardis Hughs for now. *Rectal bleeding:  Small amounts of bright red blood on the toilet paper intermittent when her hemorrhoids are irritated.  Symptomatic management prn.  CC:  Donella Stade, PA-C

## 2020-03-23 ENCOUNTER — Encounter: Payer: Self-pay | Admitting: Gastroenterology

## 2020-03-23 DIAGNOSIS — R142 Eructation: Secondary | ICD-10-CM | POA: Insufficient documentation

## 2020-03-23 NOTE — Progress Notes (Signed)
I agree with the above note, plan 

## 2020-03-31 ENCOUNTER — Other Ambulatory Visit (HOSPITAL_COMMUNITY): Payer: Self-pay | Admitting: Psychiatry

## 2020-04-05 ENCOUNTER — Other Ambulatory Visit: Payer: Self-pay | Admitting: Physician Assistant

## 2020-04-05 DIAGNOSIS — Z1231 Encounter for screening mammogram for malignant neoplasm of breast: Secondary | ICD-10-CM

## 2020-04-06 ENCOUNTER — Encounter: Payer: Self-pay | Admitting: Physician Assistant

## 2020-04-06 DIAGNOSIS — R5383 Other fatigue: Secondary | ICD-10-CM

## 2020-04-06 DIAGNOSIS — R55 Syncope and collapse: Secondary | ICD-10-CM

## 2020-04-07 NOTE — Telephone Encounter (Signed)
Wanting referral to cardiology for fatigue.   Referral pended, please advise if this is appropriate

## 2020-04-07 NOTE — Telephone Encounter (Signed)
I declined the referral, patient has not been seen for this issue specifically by [facility].  I reviewed last notes from 02/15/2020 when patient was here for routine physical and did not mention significant fatigue problems.  I would have her schedule a visit with Luvenia Starch first to address fatigue before we go referring to a bunch of specialists.

## 2020-04-15 NOTE — Progress Notes (Signed)
Virtual Visit via Video Note changed to phone visit at patient request.   This visit type was conducted due to national recommendations for restrictions regarding the COVID-19 Pandemic (e.g. social distancing) in an effort to limit this patient's exposure and mitigate transmission in our community.  Due to her co-morbid illnesses, this patient is at least at moderate risk for complications without adequate follow up.  This format is felt to be most appropriate for this patient at this time.  All issues noted in this document were discussed and addressed.  A limited physical exam was performed with this format.  Please refer to the patient's chart for her consent to telehealth for [facility].      Date:  04/21/2020   ID:  [place]ie [name], DOB 06-28-1955, MRN [id number]  Patient Location:Home Provider Location: Home  PCP:  Lavada Mesi  Cardiologist:  Dr Stanford Breed  Evaluation Performed:  Follow-Up Visit  Chief Complaint:  FU chest pain  History of Present Illness:    FU CP. Echocardiogram July 2019 showed normal LV function, mild aortic insufficiency.  Exercise treadmill July 2019 negative adequate. ABIs October 2019 normal.  Carotid Dopplers November 2019 with no significant stenosis. Since last seen patient complains of fatigue.  She complains of dyspnea both with activities and at rest.  No orthopnea, PND or pedal edema.  She continues to have occasional chest pain.  She notices it more at night.  It is described as a stabbing pain lasting several minutes.  It does not radiate.  She has some dizziness but has not had syncope.  The patient does not have symptoms concerning for COVID-19 infection (fever, chills, cough, or new shortness of breath).    Past Medical History:  Diagnosis Date   Abnormal Pap smear of cervix    Allergic rhinitis 11/08/2010   Anxiety    Asthma    Bronchitis    Herpes simplex of female genitalia    Hyperlipidemia    Mitral valve  prolapse    Osteoporosis 11/21/2011   Urticaria    Past Surgical History:  Procedure Laterality Date   CLOSED MANIPULATION SHOULDER  7&01/2005   x2, 30 days apart   CLOSED MANIPULATION SHOULDER  2006   Left   COLONOSCOPY  09/2012   Medium sized external hemorrhoids, few small divertic in left colon, otherwise normal colon (repeat 10 yrs)   ESOPHAGOGASTRODUODENOSCOPY  09/2012   Normal (Dr. Ardis Hughs)   LEEP  11/2004     Current Meds  Medication Sig   acyclovir (ZOVIRAX) 200 MG capsule Take 2 capsules (400 mg total) by mouth 2 (two) times daily.   albuterol (VENTOLIN HFA) 108 (90 Base) MCG/ACT inhaler Inhale 2 puffs into the lungs every 4 (four) hours as needed for wheezing or shortness of breath.   alendronate (FOSAMAX) 70 MG tablet TAKE 1 TABLET BY MOUTH ONCE A WEEK WITH 8 OUNCE GLASS OF WATER 30 MIN BEFORE BREAKFAST AND OTHER MEDS. DON'T LIE DOWN FOR 30 MINUTES.   ALPRAZolam (XANAX) 0.5 MG tablet TAKE 1 TABLET ONCE DAILY FOR ANXIETY.   amoxicillin-clavulanate (AUGMENTIN) 875-125 MG tablet Take 1 tablet by mouth 2 (two) times daily.   BIOTIN PO Take 1 tablet by mouth daily.   budesonide-formoterol (SYMBICORT) 160-4.5 MCG/ACT inhaler Inhale 2 puffs into the lungs 2 (two) times daily.   Cholecalciferol (VITAMIN D) 2000 units CAPS Take by mouth.   citalopram (CELEXA) 10 MG tablet TAKE (1) TABLET BY MOUTH ONCE DAILY.   cyclobenzaprine (FLEXERIL)  10 MG tablet One half to one tab PO qHS   ergocalciferol (VITAMIN D2) 1.25 MG (50000 UT) capsule Take 1 capsule (50,000 Units total) by mouth once a week.   gabapentin (NEURONTIN) 100 MG capsule TAKE 1 CAPSULE BY MOUTH FOUR TIMES DAILY.   hydrocortisone (ANUSOL-HC) 25 MG suppository Place 1 suppository (25 mg total) rectally 2 (two) times daily as needed for hemorrhoids.   meloxicam (MOBIC) 15 MG tablet TAKE 1 TABLET IN THE MORNING WITH BREAKFAST FOR 2 WEEKS, THEN DAILY AS NEEDED FOR PAIN.   Multiple Vitamins-Minerals (ZINC PO) Take  1 tablet by mouth daily.   pantoprazole (PROTONIX) 40 MG tablet Take 1 tablet (40 mg total) by mouth 2 (two) times daily before a meal.   Probiotic Product (PROBIOTIC & ACIDOPHILUS EX ST PO) Take by mouth.    Spacer/Aero-Holding Chambers (AEROCHAMBER MV) inhaler Use as instructed     Allergies:   Hydrocodone, Abilify [aripiprazole], Codeine, Morphine and related, Percocet [oxycodone-acetaminophen], and Vicodin [hydrocodone-acetaminophen]   Social History   Tobacco Use   Smoking status: Never Smoker   Smokeless tobacco: Never Used  Vaping Use   Vaping Use: Never used  Substance Use Topics   Alcohol use: No   Drug use: No     Family Hx: The patient's family history includes Allergic rhinitis in her father and mother; Asthma in her mother; COPD in her mother and sister; Depression in her maternal aunt, maternal grandfather, and sister; Diabetes in an other family member; Heart attack in her brother; Heart disease in her brother, father, and paternal uncle; Hypothyroidism in her sister and sister; Kidney cancer (age of onset: 38) in her mother; Kidney disease in her mother; Pancreatic cancer in her sister; Thyroid disease in an other family member. There is no history of Colon cancer, Rectal cancer, Stomach cancer, or Colon polyps.  ROS:   Please see the history of present illness.    No Fever, chills  or productive cough All other systems reviewed and are negative.   Recent Labs: 12/03/2019: Hemoglobin 13.0; Platelets 318; TSH 1.40 02/16/2020: ALT 11; BUN 20; Creat 0.79; Magnesium 2.1; Potassium 4.4; Sodium 140   Recent Lipid Panel Lab Results  Component Value Date/Time   CHOL 247 (H) 12/03/2019 08:14 AM   TRIG 41 12/03/2019 08:14 AM   HDL 100 12/03/2019 08:14 AM   CHOLHDL 2.5 12/03/2019 08:14 AM   LDLCALC 135 (H) 12/03/2019 08:14 AM    Wt Readings from Last 3 Encounters:  04/21/20 112 lb (50.8 kg)  04/20/20 112 lb (50.8 kg)  03/08/20 112 lb (50.8 kg)      Objective:    Vital Signs:  Ht 5\' 6"  (1.676 m)    Wt 112 lb (50.8 kg)    BMI 18.08 kg/m    VITAL SIGNS:  reviewed NAD Answers questions appropriately Normal affect Remainder of physical examination not performed (telehealth visit; coronavirus pandemic)  ASSESSMENT & PLAN:    1. Chest pain-symptoms are atypical.  We will arrange a cardiac CTA to further assess. 2. Dyspnea-schedule echocardiogram to assess LV function. 3. History of mitral valve prolapse-mild bowing on most recent echocardiogram but no significant mitral regurgitation.  Repeat echocardiogram. 4. Hyperlipidemia-Per primary care.  COVID-19 Education: The importance of social distancing was discussed today.  Time:   Today, I have spent 16 minutes with the patient with telehealth technology discussing the above problems.     Medication Adjustments/Labs and Tests Ordered: Current medicines are reviewed at length with the patient today.  Concerns regarding medicines are outlined above.   Tests Ordered: No orders of the defined types were placed in this encounter.   Medication Changes: No orders of the defined types were placed in this encounter.   Follow Up:  In Person in 1 year(s)  Signed, Kirk Ruths, MD  04/21/2020 9:04 AM    [place]

## 2020-04-20 ENCOUNTER — Encounter: Payer: Self-pay | Admitting: Physician Assistant

## 2020-04-20 ENCOUNTER — Telehealth (INDEPENDENT_AMBULATORY_CARE_PROVIDER_SITE_OTHER): Payer: Medicare HMO | Admitting: Physician Assistant

## 2020-04-20 VITALS — Ht 68.0 in | Wt 112.0 lb

## 2020-04-20 DIAGNOSIS — Z8719 Personal history of other diseases of the digestive system: Secondary | ICD-10-CM | POA: Diagnosis not present

## 2020-04-20 DIAGNOSIS — R110 Nausea: Secondary | ICD-10-CM | POA: Diagnosis not present

## 2020-04-20 DIAGNOSIS — R1032 Left lower quadrant pain: Secondary | ICD-10-CM | POA: Diagnosis not present

## 2020-04-20 MED ORDER — AMOXICILLIN-POT CLAVULANATE 875-125 MG PO TABS: 1.0000 | ORAL_TABLET | Freq: Two times a day (BID) | ORAL | 0 refills | Status: DC

## 2020-04-20 NOTE — Telephone Encounter (Signed)
Patient also left a vm. Called and made virtual appt.

## 2020-04-20 NOTE — Patient Instructions (Signed)

## 2020-04-20 NOTE — Progress Notes (Signed)
Patient ID: Kellie [name], female   DOB: 1955-10-05, 64 y.o.   MRN: [id number] .[name] KitchenVirtual Visit via Telephone Note  I connected with Kellie [name] on 04/20/20 at 11:50 AM EDT by telephone and verified that I am speaking with the correct person using two identifiers.  Location: Patient: home Provider: clinic   I discussed the limitations, risks, security and privacy concerns of performing an evaluation and management service by telephone and the availability of in person appointments. I also discussed with the patient that there may be a patient responsible charge related to this service. The patient expressed understanding and agreed to proceed.   History of Present Illness: Patient is a 64 year old female who comes into the clinic with left lower quadrant pain and history of diverticulitis.  Her abdominal pain started about 3 to 4 days ago.  It was mild cramping so she just changed her diet and thought it might get better.  Pain worsened yesterday.  She woke up this morning it was very painful.  Her bowels have become more loose.  She denies any fever, chills, vomiting.  She is very nauseated.  In 2019 she had a CT of the abdomen which showed her first bout of diverticulitis.     IMPRESSION: Diverticulosis of the colon with evidence of mild acute diverticulitis involving a short segment of sigmoid colon in the left pelvis. No diverticular abscess or perforation.  Few subcentimeter liver hypodensities too small to characterize but likely cysts or hemangiomas.  She has been treated once more empirically 07/2018.    .. Active Ambulatory Problems    Diagnosis Date Noted   GERD (gastroesophageal reflux disease) 09/27/2010   Allergic rhinitis 11/08/2010   Insomnia 11/08/2010   Cough 06/02/2011   Osteoporosis 11/21/2011   Borderline intellectual functioning 02/15/2012   Genital herpes 11/05/2012   Hemorrhoids 11/05/2012   Chronic periodontal disease 06/30/2013   Adhesive  capsulitis of right shoulder 11/24/2013   Encounter for gynecological examination with Papanicolaou smear of cervix 12/08/2013   Prediabetes 12/14/2013   Postmenopausal atrophic vaginitis 03/17/2014   Bipolar disorder with depression ([place]) 06/02/2014   Family history of renal cancer 10/11/2014   DDD (degenerative disc disease), lumbar 10/11/2014   Rhinitis, allergic 10/14/2014   Near syncope 02/09/2015   Chronic nasal congestion 03/01/2015   Vitreous floaters of right eye 03/01/2015   DDD (degenerative disc disease), cervical 07/05/2015   Cough, persistent 07/21/2015   Post-nasal drip 07/21/2015   Left knee pain 11/09/2015   Chronic pain of both knees 11/09/2015   Plantar fasciitis, bilateral 11/09/2015   Hoarseness 03/09/2016   Pulmonary nodule 08/08/2016   Bipolar 1 disorder with moderate mania ([place]) 09/17/2016   Panic attacks 09/18/2016   SOB (shortness of breath) 09/18/2016   Weakness 10/05/2016   Family history of pancreatic cancer 02/18/2017   Diarrhea 02/18/2017   Loss of weight 02/18/2017   Unintentional weight loss 05/18/2017   Elevated LDL cholesterol level 05/18/2017   Hyperlipidemia 05/18/2017   Irritable bowel syndrome with both constipation and diarrhea 05/18/2017   History of loop electrical excision procedure (LEEP) 07/01/2017   Atypical chest pain 10/22/2017   Anxiety 10/22/2017   Aortic insufficiency 01/10/2018   Non-restorative sleep 01/10/2018   Snoring 01/10/2018   Dermatochalasis of both upper eyelids 03/13/2018   Periodic limb movement 03/13/2018   Nocturnal leg cramps 03/13/2018   Vaginal dryness 09/02/2018   History of diverticulitis 09/16/2018   Chronic pain of both ankles 01/15/2019   Cervical stenosis (uterine cervix)  02/13/2019   Vitamin D insufficiency 02/16/2019   Plantar fasciitis, right 04/02/2019   Reactive airway disease that is not asthma 04/17/2019   Cortical age-related cataract of both  eyes 12/22/2018   Pinguecula of both eyes 12/22/2018   Nuclear sclerotic cataract of both eyes 12/22/2018   Anterolisthesis 07/28/2019   Vitamin D deficiency 12/02/2019   No energy 12/02/2019   Rectal bleeding 12/02/2019   Dysphagia 12/02/2019   Tenderness of neck 12/02/2019   Hyperinflation of lungs 01/15/2020   History of iron deficiency 02/15/2020   Globin abnormality ([place]) 02/19/2020   Hiatal hernia 02/19/2020   Gastroesophageal reflux disease with esophagitis 02/19/2020   Belching 03/23/2020   Resolved Ambulatory Problems    Diagnosis Date Noted   Sinusitis 09/27/2010   Depression 09/27/2010   Bronchitis 12/04/2010   General medical examination 10/18/2011   Dizziness 11/21/2011   Knee pain 11/21/2011   Low back pain 11/21/2011   Heel pain 07/14/2012   Thoracic myofascial strain 03/13/2013   Sinusitis 04/20/2013   Cough 04/22/2013   Rotator cuff disorder 08/01/2013   Right shoulder pain 10/27/2013   Adhesive capsulitis 11/02/2013   Pain in joint, shoulder region 11/24/2013   Bipolar disorder ([place]) 04/29/2014   Right serous otitis media 08/03/2014   Acute recurrent maxillary sinusitis 10/14/2014   Right-sided low back pain without sciatica 10/14/2014   Sinusitis 02/09/2015   Sacroiliac joint dysfunction of right side 02/25/2015   Piriformis syndrome 03/01/2015   Adhesive capsulitis 07/05/2015   Radiculitis of right cervical region 07/06/2015   Drug reaction 09/25/2016   Mitral valve prolapse 01/10/2018   Past Medical History:  Diagnosis Date   Abnormal Pap smear of cervix    Allergic rhinitis 11/08/2010   Asthma    Herpes simplex of female genitalia    Urticaria    Reviewed med, allergy, problem list.  Observations/Objective: No acute distress Normal breathing.   .. Today's Vitals   04/20/20 1123  Weight: 112 lb (50.8 kg)  Height: 5\' 8"  (1.727 m)   Body mass index is 17.03 kg/m.    Assessment and  Plan: [name] KitchenMarland KitchenTaja was seen today for abdominal pain.  Diagnoses and all orders for this visit:  Left lower quadrant pain -     amoxicillin-clavulanate (AUGMENTIN) 875-125 MG tablet; Take 1 tablet by mouth 2 (two) times daily.  History of diverticulitis -     amoxicillin-clavulanate (AUGMENTIN) 875-125 MG tablet; Take 1 tablet by mouth 2 (two) times daily.  Nausea -     amoxicillin-clavulanate (AUGMENTIN) 875-125 MG tablet; Take 1 tablet by mouth 2 (two) times daily.   Fortunately this is only her second flare since diagnosis in 2019.  All signs and symptoms point to diverticulitis.  She does not have any red flag symptoms for systemic infection.  Patient request Augmentin instead of Cipro/Flagyl treatment.  Discussed to start a probiotic to help with her overall gut flora.  Encouraged liquid diet for the next 48 hours.  Discussed other diet changes to prevent diverticulitis flares.  If not improving or worsening please follow-up in office.   Follow Up Instructions:    I discussed the assessment and treatment plan with the patient. The patient was provided an opportunity to ask questions and all were answered. The patient agreed with the plan and demonstrated an understanding of the instructions.   The patient was advised to call back or seek an in-person evaluation if the symptoms worsen or if the condition fails to improve as anticipated.  I provided  15 minutes of non-face-to-face time during this encounter.   [name], PA-C

## 2020-04-20 NOTE — Progress Notes (Signed)
Started yesterday States maybe ate some nuts she shouldn't have Having pain ("feels like I am having a baby")  Requesting 1 medication instead of Cipro/Flagyl

## 2020-04-21 ENCOUNTER — Telehealth (INDEPENDENT_AMBULATORY_CARE_PROVIDER_SITE_OTHER): Payer: Medicare HMO | Admitting: Cardiology

## 2020-04-21 ENCOUNTER — Encounter: Payer: Self-pay | Admitting: Cardiology

## 2020-04-21 VITALS — BP 131/77 | HR 109 | Ht 66.0 in | Wt 112.0 lb

## 2020-04-21 DIAGNOSIS — I341 Nonrheumatic mitral (valve) prolapse: Secondary | ICD-10-CM

## 2020-04-21 DIAGNOSIS — R0600 Dyspnea, unspecified: Secondary | ICD-10-CM | POA: Diagnosis not present

## 2020-04-21 DIAGNOSIS — R072 Precordial pain: Secondary | ICD-10-CM | POA: Diagnosis not present

## 2020-04-21 MED ORDER — METOPROLOL TARTRATE 100 MG PO TABS: ORAL_TABLET | ORAL | 0 refills | Status: DC

## 2020-04-21 NOTE — Patient Instructions (Signed)
Testing/Procedures:  Your physician has requested that you have an echocardiogram. Echocardiography is a painless test that uses sound waves to create images of your heart. It provides your doctor with information about the size and shape of your heart and how well your heart's chambers and valves are working. This procedure takes approximately one hour. There are no restrictions for this procedure.[place]    Your cardiac CT will be scheduled at one of the below locations:   [facility] [address] [place], [place] [address] [phone]  If scheduled at [facility], please arrive at the [facility] main entrance of [facility] 30 minutes prior to test start time. Proceed to the [facility] Radiology Department (first floor) to check-in and test prep.   Please follow these instructions carefully (unless otherwise directed):  On the Night Before the Test: . Be sure to Drink plenty of water. . Do not consume any caffeinated/decaffeinated beverages or chocolate 12 hours prior to your test. . Do not take any antihistamines 12 hours prior to your test.  On the Day of the Test: . Drink plenty of water. Do not drink any water within one hour of the test. . Do not eat any food 4 hours prior to the test. . You may take your regular medications prior to the test.  . Take metoprolol (Lopressor) 100 MG two hours prior to test. . HOLD Furosemide/Hydrochlorothiazide morning of the test. . FEMALES- please wear underwire-free bra if available       After the Test: . Drink plenty of water. . After receiving IV contrast, you may experience a mild flushed feeling. This is normal. . On occasion, you may experience a mild rash up to 24 hours after the test. This is not dangerous. If this occurs, you can take Benadryl 25 mg and increase your fluid intake. . If you experience trouble breathing, this can be serious. If it is severe call 911 IMMEDIATELY.  If it is mild, please call our office. . If you take any of these medications: Glipizide/Metformin, Avandament, Glucavance, please do not take 48 hours after completing test unless otherwise instructed.   Once we have confirmed authorization from your insurance company, we will call you to set up a date and time for your test. Based on how quickly your insurance processes prior authorizations requests, please allow up to 4 weeks to be contacted for scheduling your Cardiac CT appointment. Be advised that routine Cardiac CT appointments could be scheduled as many as 8 weeks after your provider has ordered it.  For non-scheduling related questions, please contact the cardiac imaging nurse navigator should you have any questions/concerns: Marchia Bond, Cardiac Imaging Nurse Navigator Burley Saver, Interim Cardiac Imaging Nurse [place] [facility] Direct Office Dial: [phone]   For scheduling needs, including cancellations and rescheduling, please call Vivien Rota at [phone], option 3.     Follow-Up: At [facility], you and your health needs are our priority.  As part of our continuing mission to provide you with exceptional heart care, we have created designated Provider Care Teams.  These Care Teams include your primary Cardiologist (physician) and Advanced Practice Providers (APPs -  Physician Assistants and Nurse Practitioners) who all work together to provide you with the care you need, when you need it.  We recommend signing up for the patient portal called "MyChart".  Sign up information is provided on this After Visit Summary.  MyChart is used to  connect with patients for Virtual Visits (Telemedicine).  Patients are able to view lab/test results, encounter notes, upcoming appointments, etc.  Non-urgent messages can be sent to your provider as well.   To learn more about what you can do with MyChart, go to [email or web address].    Your next appointment:   6  month(s)  The format for your next appointment:   In Person  Provider:   Kirk Ruths, MD

## 2020-04-21 NOTE — Addendum Note (Signed)
Addended by: Cristopher Estimable on: 04/21/2020 09:57 AM   Modules accepted: Orders

## 2020-04-22 ENCOUNTER — Encounter: Payer: Self-pay | Admitting: Gastroenterology

## 2020-04-25 ENCOUNTER — Other Ambulatory Visit: Payer: Self-pay | Admitting: *Deleted

## 2020-04-25 DIAGNOSIS — R072 Precordial pain: Secondary | ICD-10-CM

## 2020-05-02 ENCOUNTER — Other Ambulatory Visit: Payer: Self-pay | Admitting: Physician Assistant

## 2020-05-02 ENCOUNTER — Other Ambulatory Visit (HOSPITAL_COMMUNITY): Payer: Self-pay | Admitting: Psychiatry

## 2020-05-02 DIAGNOSIS — A6004 Herpesviral vulvovaginitis: Secondary | ICD-10-CM

## 2020-05-11 ENCOUNTER — Ambulatory Visit (INDEPENDENT_AMBULATORY_CARE_PROVIDER_SITE_OTHER): Payer: Medicare HMO

## 2020-05-11 ENCOUNTER — Other Ambulatory Visit: Payer: Self-pay

## 2020-05-11 DIAGNOSIS — Z1231 Encounter for screening mammogram for malignant neoplasm of breast: Secondary | ICD-10-CM | POA: Diagnosis not present

## 2020-05-16 ENCOUNTER — Encounter: Payer: Self-pay | Admitting: Physician Assistant

## 2020-05-16 ENCOUNTER — Encounter (HOSPITAL_COMMUNITY): Payer: Self-pay | Admitting: Cardiology

## 2020-05-16 ENCOUNTER — Other Ambulatory Visit (HOSPITAL_COMMUNITY): Payer: Medicare HMO

## 2020-05-16 DIAGNOSIS — N644 Mastodynia: Secondary | ICD-10-CM

## 2020-05-16 DIAGNOSIS — R0602 Shortness of breath: Secondary | ICD-10-CM

## 2020-05-16 NOTE — Progress Notes (Signed)
Normal mammogram. Follow up in 1 year.

## 2020-05-18 ENCOUNTER — Telehealth (HOSPITAL_COMMUNITY): Payer: Self-pay | Admitting: Emergency Medicine

## 2020-05-18 NOTE — Telephone Encounter (Signed)
Pt states she cancelled test yesterday. I apologized for calling unnecessarily.   Marchia Bond RN Navigator Cardiac Imaging [facility] [phone] Office  [phone] Cell

## 2020-05-23 ENCOUNTER — Ambulatory Visit (HOSPITAL_COMMUNITY): Payer: Medicare HMO

## 2020-05-27 ENCOUNTER — Encounter (HOSPITAL_COMMUNITY): Payer: Self-pay | Admitting: *Deleted

## 2020-05-27 ENCOUNTER — Other Ambulatory Visit: Payer: Self-pay

## 2020-05-27 ENCOUNTER — Emergency Department (HOSPITAL_COMMUNITY)
Admission: EM | Admit: 2020-05-27 | Discharge: 2020-05-27 | Disposition: A | Discharge status: Home / Self Care | Payer: Medicare HMO | Attending: Emergency Medicine | Admitting: Emergency Medicine

## 2020-05-27 ENCOUNTER — Emergency Department (HOSPITAL_COMMUNITY): Payer: Medicare HMO

## 2020-05-27 DIAGNOSIS — R531 Weakness: Secondary | ICD-10-CM | POA: Diagnosis not present

## 2020-05-27 DIAGNOSIS — U071 COVID-19: Secondary | ICD-10-CM | POA: Diagnosis not present

## 2020-05-27 DIAGNOSIS — R059 Cough, unspecified: Secondary | ICD-10-CM | POA: Diagnosis not present

## 2020-05-27 DIAGNOSIS — Z7951 Long term (current) use of inhaled steroids: Secondary | ICD-10-CM | POA: Diagnosis not present

## 2020-05-27 DIAGNOSIS — J45909 Unspecified asthma, uncomplicated: Secondary | ICD-10-CM | POA: Insufficient documentation

## 2020-05-27 LAB — BASIC METABOLIC PANEL
Anion gap: 9 (ref 5–15)
BUN: 10 mg/dL (ref 8–23)
CO2: 29 mmol/L (ref 22–32)
Calcium: 8.8 mg/dL — ABNORMAL LOW (ref 8.9–10.3)
Chloride: 100 mmol/L (ref 98–111)
Creatinine, Ser: 0.67 mg/dL (ref 0.44–1.00)
GFR, Estimated: >60 mL/min (ref >60)
Glucose, Bld: 97 mg/dL (ref 70–99)
Potassium: 3.8 mmol/L (ref 3.5–5.1)
Sodium: 138 mmol/L (ref 135–145)

## 2020-05-27 LAB — RESP PANEL BY RT-PCR (FLU A&B, COVID) ARPGX2
Influenza A by PCR: NEGATIVE
Influenza B by PCR: NEGATIVE
SARS Coronavirus 2 by RT PCR: POSITIVE — AB

## 2020-05-27 LAB — CBC WITH DIFFERENTIAL/PLATELET
Abs Immature Granulocytes: 0.01 10*3/uL (ref 0.00–0.07)
Basophils Absolute: 0 10*3/uL (ref 0.0–0.1)
Basophils Relative: 0 %
Eosinophils Absolute: 0 10*3/uL (ref 0.0–0.5)
Eosinophils Relative: 0 %
HCT: 38.9 % (ref 36.0–46.0)
Hemoglobin: 13.1 g/dL (ref 12.0–15.0)
Immature Granulocytes: 0 %
Lymphocytes Relative: 22 %
Lymphs Abs: 0.8 10*3/uL (ref 0.7–4.0)
MCH: 31.6 pg (ref 26.0–34.0)
MCHC: 33.7 g/dL (ref 30.0–36.0)
MCV: 93.7 fL (ref 80.0–100.0)
Monocytes Absolute: 0.3 10*3/uL (ref 0.1–1.0)
Monocytes Relative: 9 %
Neutro Abs: 2.5 10*3/uL (ref 1.7–7.7)
Neutrophils Relative %: 69 %
Platelets: 217 10*3/uL (ref 150–400)
RBC: 4.15 MIL/uL (ref 3.87–5.11)
RDW: 12.2 % (ref 11.5–15.5)
WBC: 3.7 10*3/uL — ABNORMAL LOW (ref 4.0–10.5)
nRBC: 0 % (ref 0.0–0.2)

## 2020-05-27 MED ORDER — SODIUM CHLORIDE 0.9 % IV BOLUS
1000.0000 mL | Freq: Once | INTRAVENOUS | Status: AC
  Administered 2020-05-27: 1000 mL via INTRAVENOUS

## 2020-05-27 MED ORDER — ALBUTEROL SULFATE HFA 108 (90 BASE) MCG/ACT IN AERS: 2.0000 | INHALATION_SPRAY | RESPIRATORY_TRACT | Status: DC | PRN

## 2020-05-27 NOTE — ED Notes (Signed)
Date and time results received: 05/27/20 1853 (use smartphrase ".now" to insert current time)  Test: Covid Critical Value: positive  Name of Provider Notified: Dr. Alvino Chapel  Orders Received? Or Actions Taken?: See chart

## 2020-05-27 NOTE — ED Triage Notes (Signed)
Pt with generalized weakness for past 5 weeks.  Pt with cough x 5 days.  Fever as high as 100.3.

## 2020-05-27 NOTE — Discharge Instructions (Addendum)
Isolate yourself for 10 days after the symptoms started.  As long as you are fever free for 2 days.  Return for worsening shortness of breath.

## 2020-05-27 NOTE — ED Provider Notes (Signed)
[facility] EMERGENCY DEPARTMENT Provider Note   CSN: [id number] Arrival date & time: 05/27/20  1518     History Chief Complaint  Patient presents with  . Weakness    SAHAR RYBACK is a 64 y.o. female.  HPI Patient presents with generalized weakness and cough.  Around 5 days been coughing with no real sputum production.  Reportedly had fevers up to 100.3.  States she has had more fatigue and also feels that she is dehydrated.  No nausea vomiting but feels as if she has had less oral intake.  States she has a history of bronchitis.  Never smoked.  No chest pain.  No abdominal pain.  No diarrhea.  Patient has not had her Covid vaccine.  She did not give a reason why.    Past Medical History:  Diagnosis Date  . Abnormal Pap smear of cervix   . Allergic rhinitis 11/08/2010  . Anxiety   . Asthma   . Bronchitis   . Herpes simplex of female genitalia   . Hyperlipidemia   . Mitral valve prolapse   . Osteoporosis 11/21/2011  . Urticaria     Patient Active Problem List   Diagnosis Date Noted  . Belching 03/23/2020  . Globin abnormality ([place]) 02/19/2020  . Hiatal hernia 02/19/2020  . Gastroesophageal reflux disease with esophagitis 02/19/2020  . History of iron deficiency 02/15/2020  . Hyperinflation of lungs 01/15/2020  . Vitamin D deficiency 12/02/2019  . No energy 12/02/2019  . Rectal bleeding 12/02/2019  . Dysphagia 12/02/2019  . Tenderness of neck 12/02/2019  . Anterolisthesis 07/28/2019  . Reactive airway disease that is not asthma 04/17/2019  . Plantar fasciitis, right 04/02/2019  . Vitamin D insufficiency 02/16/2019  . Cervical stenosis (uterine cervix) 02/13/2019  . Chronic pain of both ankles 01/15/2019  . Cortical age-related cataract of both eyes 12/22/2018  . Pinguecula of both eyes 12/22/2018  . Nuclear sclerotic cataract of both eyes 12/22/2018  . History of diverticulitis 09/16/2018  . Vaginal dryness 09/02/2018  . Dermatochalasis of both upper eyelids  03/13/2018  . Periodic limb movement 03/13/2018  . Nocturnal leg cramps 03/13/2018  . Aortic insufficiency 01/10/2018  . Non-restorative sleep 01/10/2018  . Snoring 01/10/2018  . Atypical chest pain 10/22/2017  . Anxiety 10/22/2017  . History of loop electrical excision procedure (LEEP) 07/01/2017  . Unintentional weight loss 05/18/2017  . Elevated LDL cholesterol level 05/18/2017  . Hyperlipidemia 05/18/2017  . Irritable bowel syndrome with both constipation and diarrhea 05/18/2017  . Family history of pancreatic cancer 02/18/2017  . Diarrhea 02/18/2017  . Loss of weight 02/18/2017  . Weakness 10/05/2016  . Panic attacks 09/18/2016  . SOB (shortness of breath) 09/18/2016  . Bipolar 1 disorder with moderate mania ([place]) 09/17/2016  . Pulmonary nodule 08/08/2016  . Hoarseness 03/09/2016  . Left knee pain 11/09/2015  . Chronic pain of both knees 11/09/2015  . Plantar fasciitis, bilateral 11/09/2015  . Cough, persistent 07/21/2015  . Post-nasal drip 07/21/2015  . DDD (degenerative disc disease), cervical 07/05/2015  . Chronic nasal congestion 03/01/2015  . Vitreous floaters of right eye 03/01/2015  . Near syncope 02/09/2015  . Rhinitis, allergic 10/14/2014  . Family history of renal cancer 10/11/2014  . DDD (degenerative disc disease), lumbar 10/11/2014  . Bipolar disorder with depression ([place]) 06/02/2014  . Postmenopausal atrophic vaginitis 03/17/2014  . Prediabetes 12/14/2013  . Encounter for gynecological examination with Papanicolaou smear of cervix 12/08/2013  . Adhesive capsulitis of right shoulder 11/24/2013  .  Chronic periodontal disease 06/30/2013  . Genital herpes 11/05/2012  . Hemorrhoids 11/05/2012  . Borderline intellectual functioning 02/15/2012  . Osteoporosis 11/21/2011  . Cough 06/02/2011  . Allergic rhinitis 11/08/2010  . Insomnia 11/08/2010  . GERD (gastroesophageal reflux disease) 09/27/2010    Past Surgical History:  Procedure Laterality Date  .  CLOSED MANIPULATION SHOULDER  7&01/2005   x2, 30 days apart  . CLOSED MANIPULATION SHOULDER  2006   Left  . COLONOSCOPY  09/2012   Medium sized external hemorrhoids, few small divertic in left colon, otherwise normal colon (repeat 10 yrs)  . ESOPHAGOGASTRODUODENOSCOPY  09/2012   Normal (Dr. Ardis Hughs)  . LEEP  11/2004     OB History    Gravida  1   Para  1   Term  1   Preterm      AB      Living  1     SAB      TAB      Ectopic      Multiple      Live Births              Family History  Problem Relation Age of Onset  . Kidney cancer Mother 45  . COPD Mother   . Kidney disease Mother   . Allergic rhinitis Mother   . Asthma Mother   . Heart disease Father   . Allergic rhinitis Father   . COPD Sister   . Depression Sister   . Pancreatic cancer Sister   . Heart disease Brother   . Heart attack Brother   . Heart disease Paternal Uncle   . Diabetes Other        neice  . Thyroid disease Other        neice  . Depression Maternal Aunt   . Depression Maternal Grandfather   . Hypothyroidism Sister   . Hypothyroidism Sister   . Colon cancer Neg Hx   . Rectal cancer Neg Hx   . Stomach cancer Neg Hx   . Colon polyps Neg Hx     Social History   Tobacco Use  . Smoking status: Never Smoker  . Smokeless tobacco: Never Used  Vaping Use  . Vaping Use: Never used  Substance Use Topics  . Alcohol use: No  . Drug use: No    Home Medications Prior to Admission medications   Medication Sig Start Date End Date Taking? Authorizing Provider  acyclovir (ZOVIRAX) 200 MG capsule TAKE 2 CAPSULES BY MOUTH TWICE DAILY. 05/02/20   Breeback, Royetta Car, PA-C  albuterol (VENTOLIN HFA) 108 (90 Base) MCG/ACT inhaler Inhale 2 puffs into the lungs every 4 (four) hours as needed for wheezing or shortness of breath. 01/19/20   Hunsucker, Bonna Gains, MD  alendronate (FOSAMAX) 70 MG tablet TAKE 1 TABLET BY MOUTH ONCE A WEEK WITH 8 OUNCE GLASS OF WATER 30 MIN BEFORE BREAKFAST AND OTHER  MEDS. DON'T LIE DOWN FOR 30 MINUTES. 02/01/20   Breeback, Jade L, PA-C  ALPRAZolam (XANAX) 0.5 MG tablet TAKE 1 TABLET ONCE DAILY FOR ANXIETY. 12/01/19   Breeback, Jade L, PA-C  amoxicillin-clavulanate (AUGMENTIN) 875-125 MG tablet Take 1 tablet by mouth 2 (two) times daily. 04/20/20   Breeback, Jade L, PA-C  BIOTIN PO Take 1 tablet by mouth daily.    [provider]  budesonide-formoterol (SYMBICORT) 160-4.5 MCG/ACT inhaler Inhale 2 puffs into the lungs 2 (two) times daily. 01/13/20   Breeback, Royetta Car, PA-C  Cholecalciferol (VITAMIN D) 2000 units  CAPS Take by mouth.    [provider]  citalopram (CELEXA) 10 MG tablet TAKE (1) TABLET BY MOUTH ONCE DAILY. 05/02/20   Merian Capron, MD  cyclobenzaprine (FLEXERIL) 10 MG tablet One half to one tab PO qHS 10/29/19   Sanjuana Kava, MD  ergocalciferol (VITAMIN D2) 1.25 MG (50000 UT) capsule Take 1 capsule (50,000 Units total) by mouth once a week. 02/15/20   Breeback, Jade L, PA-C  gabapentin (NEURONTIN) 100 MG capsule TAKE 1 CAPSULE BY MOUTH FOUR TIMES DAILY. 05/02/20   Merian Capron, MD  hydrocortisone (ANUSOL-HC) 25 MG suppository Place 1 suppository (25 mg total) rectally 2 (two) times daily as needed for hemorrhoids. 12/02/19   Breeback, Jade L, PA-C  meloxicam (MOBIC) 15 MG tablet TAKE 1 TABLET IN THE MORNING WITH BREAKFAST FOR 2 WEEKS, THEN DAILY AS NEEDED FOR PAIN. 07/20/19   Early, Coralee Pesa, NP  metoprolol tartrate (LOPRESSOR) 100 MG tablet TAKE 2 HOURS PRIOR TO CT SCAN 04/21/20   Lelon Perla, MD  Multiple Vitamins-Minerals (ZINC PO) Take 1 tablet by mouth daily.    [provider]  pantoprazole (PROTONIX) 40 MG tablet Take 1 tablet (40 mg total) by mouth 2 (two) times daily before a meal. 03/08/20   Zehr, Janett Billow D, PA-C  Probiotic Product (PROBIOTIC & ACIDOPHILUS EX ST PO) Take by mouth.     [provider]  Spacer/Aero-Holding Chambers (AEROCHAMBER MV) inhaler Use as instructed 01/19/20   Hunsucker, Bonna Gains, MD     Allergies    Hydrocodone, Abilify [aripiprazole], Codeine, Morphine and related, Percocet [oxycodone-acetaminophen], and Vicodin [hydrocodone-acetaminophen]  Review of Systems   Review of Systems  Constitutional: Positive for fatigue and fever. Negative for appetite change.  HENT: Negative for congestion.   Respiratory: Positive for cough, shortness of breath and wheezing.   Gastrointestinal: Negative for abdominal pain.  Genitourinary: Negative for flank pain.  Musculoskeletal: Negative for back pain.  Skin: Negative for rash.  Neurological: Negative for weakness.  Psychiatric/Behavioral: Negative for confusion.    Physical Exam Updated Vital Signs BP 110/62   Pulse 78   Temp 98.6 F (37 C)   Resp 18   Ht 5\' 6"  (1.676 m)   Wt 49.9 kg   SpO2 98%   BMI 17.75 kg/m   Physical Exam Vitals and nursing note reviewed.  HENT:     Head: Atraumatic.  Eyes:     Pupils: Pupils are equal, round, and reactive to light.  Cardiovascular:     Rate and Rhythm: Regular rhythm.  Pulmonary:     Breath sounds: No wheezing or rhonchi.     Comments: Wheezes on the right midlung field.  No respiratory distress. Abdominal:     Tenderness: There is no abdominal tenderness.  Musculoskeletal:        General: No tenderness.     Cervical back: Neck supple.     Right lower leg: No edema.     Left lower leg: No edema.  Skin:    General: Skin is warm.     Capillary Refill: Capillary refill takes less than 2 seconds.  Neurological:     Mental Status: She is alert and oriented to person, place, and time.  Psychiatric:        Mood and Affect: Mood normal.     ED Results / Procedures / Treatments   Labs (all labs ordered are listed, but only abnormal results are displayed) Labs Reviewed  RESP PANEL BY RT-PCR (FLU A&B, COVID) ARPGX2 - Abnormal;  Notable for the following components:      Result Value   SARS Coronavirus 2 by RT PCR POSITIVE (*)    All other components within normal  limits  BASIC METABOLIC PANEL - Abnormal; Notable for the following components:   Calcium 8.8 (*)    All other components within normal limits  CBC WITH DIFFERENTIAL/PLATELET - Abnormal; Notable for the following components:   WBC 3.7 (*)    All other components within normal limits    EKG None  Radiology DG Chest Portable 1 View  Result Date: 05/27/2020 CLINICAL DATA:  64 year old female with cough and weakness. EXAM: PORTABLE CHEST 1 VIEW COMPARISON:  Chest radiograph dated 01/12/2020. FINDINGS: No focal consolidation, pleural effusion, pneumothorax. Background of chronic bronchitic changes. The cardiac silhouette is within limits. No acute osseous pathology. IMPRESSION: No active disease. Electronically Signed   By: Anner Crete M.D.   On: 05/27/2020 16:31    Procedures Procedures (including critical care time)  Medications Ordered in ED Medications  albuterol (VENTOLIN HFA) 108 (90 Base) MCG/ACT inhaler 2 puff (has no administration in time range)  sodium chloride 0.9 % bolus 1,000 mL (0 mLs Intravenous Stopped 05/27/20 1805)    ED Course  I have reviewed the triage vital signs and the nursing notes.  Pertinent labs & imaging results that were available during my care of the patient were reviewed by me and considered in my medical decision making (see chart for details).    MDM Rules/Calculators/A&P                          Patient presents with weakness and cough.  Is around 5 days with a cough.  Has been a week for little longer.  X-ray reassuring although did have some wheezing particular on the right side.  This cleared up with albuterol.  Did have some change in her taste that she stated food tasted bad.  Lab work reassuring overall.  Fluid boluses been given for possible dehydration.  However Covid test came back positive.  Patient is unvaccinated.  Patient did not want antibiotic treatment although she is high risk due to her lung issues.  Discharge home with  outpatient follow-up. Final Clinical Impression(s) / ED Diagnoses Final diagnoses:  [id number]    Rx / DC Orders ED Discharge Orders    None       Davonna Belling, MD 05/27/20 1935

## 2020-05-28 ENCOUNTER — Telehealth: Payer: Self-pay | Admitting: Nurse Practitioner

## 2020-05-28 NOTE — Telephone Encounter (Signed)
I called [name] to discuss Covid symptoms and the use of Sotrovimab, a monoclonal antibody infusion for those with mild to moderate Covid symptoms and at a high risk of hospitalization.     Pt is qualified for this infusion at the monoclonal antibody infusion center due to co-morbid conditions and/or a member of an at-risk group, however declines infusion at this time. Symptoms tier reviewed as well as criteria for ending isolation.  Symptoms reviewed that would warrant ED/Hospital evaluation. Preventative practices reviewed. Patient verbalized understanding. Patient advised to call back if he decides that he does want to get infusion. Callback number to the infusion center given. Patient advised to go to Urgent care or ED with severe symptoms. Last date pt would be eligible for infusion is 12/8.   Murray Hodgkins, NP

## 2020-05-31 ENCOUNTER — Other Ambulatory Visit (HOSPITAL_COMMUNITY): Payer: Self-pay | Admitting: Psychiatry

## 2020-05-31 ENCOUNTER — Other Ambulatory Visit: Payer: Self-pay | Admitting: Physician Assistant

## 2020-05-31 ENCOUNTER — Telehealth (HOSPITAL_COMMUNITY): Payer: Self-pay | Admitting: Cardiology

## 2020-05-31 DIAGNOSIS — F410 Panic disorder [episodic paroxysmal anxiety] without agoraphobia: Secondary | ICD-10-CM

## 2020-05-31 DIAGNOSIS — F419 Anxiety disorder, unspecified: Secondary | ICD-10-CM

## 2020-05-31 DIAGNOSIS — A6004 Herpesviral vulvovaginitis: Secondary | ICD-10-CM

## 2020-05-31 NOTE — Telephone Encounter (Signed)
05/31/20 Patient cancelled due to sick with COVID and will call back to reschedule echocardiogram. She No Showed last echo appt on 05/16/20. Patient does not wish to reschedule at this time and will call us back.Order will be removed from the [place] and if patient calls back to reschedule we can reinstate the order. Thank you.

## 2020-05-31 NOTE — Telephone Encounter (Signed)
Last written 12/01/2019 #30 no refills

## 2020-06-01 DIAGNOSIS — U071 COVID-19: Secondary | ICD-10-CM | POA: Diagnosis not present

## 2020-06-01 DIAGNOSIS — R918 Other nonspecific abnormal finding of lung field: Secondary | ICD-10-CM | POA: Diagnosis not present

## 2020-06-01 DIAGNOSIS — R0602 Shortness of breath: Secondary | ICD-10-CM | POA: Diagnosis not present

## 2020-06-01 DIAGNOSIS — G8911 Acute pain due to trauma: Secondary | ICD-10-CM | POA: Diagnosis not present

## 2020-06-01 DIAGNOSIS — S0083XA Contusion of other part of head, initial encounter: Secondary | ICD-10-CM | POA: Diagnosis not present

## 2020-06-01 DIAGNOSIS — R55 Syncope and collapse: Secondary | ICD-10-CM | POA: Diagnosis not present

## 2020-06-01 DIAGNOSIS — J1282 Pneumonia due to coronavirus disease 2019: Secondary | ICD-10-CM | POA: Diagnosis not present

## 2020-06-01 DIAGNOSIS — Z885 Allergy status to narcotic agent status: Secondary | ICD-10-CM | POA: Diagnosis not present

## 2020-06-01 DIAGNOSIS — J189 Pneumonia, unspecified organism: Secondary | ICD-10-CM | POA: Diagnosis not present

## 2020-06-02 DIAGNOSIS — G8911 Acute pain due to trauma: Secondary | ICD-10-CM | POA: Diagnosis not present

## 2020-06-06 ENCOUNTER — Other Ambulatory Visit (HOSPITAL_COMMUNITY): Payer: Medicare HMO

## 2020-06-06 MED ORDER — ALBUTEROL SULFATE (2.5 MG/3ML) 0.083% IN NEBU: 2.5000 mg | INHALATION_SOLUTION | RESPIRATORY_TRACT | 0 refills | Status: DC | PRN

## 2020-06-06 NOTE — Addendum Note (Signed)
Addended by: Donella Stade on: 06/06/2020 11:05 AM   Modules accepted: Orders

## 2020-06-07 NOTE — Addendum Note (Signed)
Addended byAnnamaria Helling on: 06/07/2020 11:31 AM   Modules accepted: Orders

## 2020-06-08 ENCOUNTER — Telehealth (INDEPENDENT_AMBULATORY_CARE_PROVIDER_SITE_OTHER): Payer: Medicare HMO | Admitting: Psychiatry

## 2020-06-08 ENCOUNTER — Encounter (HOSPITAL_COMMUNITY): Payer: Self-pay | Admitting: Psychiatry

## 2020-06-08 DIAGNOSIS — J1282 Pneumonia due to coronavirus disease 2019: Secondary | ICD-10-CM | POA: Diagnosis not present

## 2020-06-08 DIAGNOSIS — F419 Anxiety disorder, unspecified: Secondary | ICD-10-CM | POA: Diagnosis not present

## 2020-06-08 DIAGNOSIS — F5102 Adjustment insomnia: Secondary | ICD-10-CM | POA: Diagnosis not present

## 2020-06-08 DIAGNOSIS — U071 COVID-19: Secondary | ICD-10-CM | POA: Diagnosis not present

## 2020-06-08 DIAGNOSIS — F319 Bipolar disorder, unspecified: Secondary | ICD-10-CM | POA: Diagnosis not present

## 2020-06-08 DIAGNOSIS — J189 Pneumonia, unspecified organism: Secondary | ICD-10-CM | POA: Diagnosis not present

## 2020-06-08 NOTE — Progress Notes (Signed)
Patient ID: Kellie [name], female   DOB: 10-Feb-1956, 64 y.o.   MRN: [id number]   [facility] [place] Follow up Outpatient visit  Kellie [name] [id number] 64 y.o.  06/08/2020 8:42 AM  Chief Complaint:   Follow up for Bipolar  And anxiety.  History of Present Illness:     I connected with [name] on 06/08/20 at  8:30 AM EST by telephone and verified that I am speaking with the correct person using two identifiers.  I discussed the limitations, risks, security and privacy concerns of performing an evaluation and management service by telephone and the availability of in person appointments. I also discussed with the patient that there may be a patient responsible charge related to this service. The patient expressed understanding and agreed to proceed.  Patient location home Provider location : home office  Has had covid, penumonia, recovering slowly, feels fatigue, following with pcp Mood wise doing fair, denies significant depression or mood swings  Niece died of covid 4 months ago Avoiding xanax for now    Modifying factor:family Duration more then 5 years  Past Psychiatric History/Hospitalization(s) denies  Hospitalization for psychiatric illness: No History of Electroconvulsive Shock Therapy: No Prior Suicide Attempts: No  Medical History; Past Medical History:  Diagnosis Date  . Abnormal Pap smear of cervix   . Allergic rhinitis 11/08/2010  . Anxiety   . Asthma   . Bronchitis   . Herpes simplex of female genitalia   . Hyperlipidemia   . Mitral valve prolapse   . Osteoporosis 11/21/2011  . Urticaria     Allergies: Allergies  Allergen Reactions  . Hydrocodone Nausea And Vomiting  . Abilify [Aripiprazole]     Nausea/vomiting/weakness/shaky  . Codeine Nausea And Vomiting  . Morphine And Related Nausea And Vomiting  . Percocet [Oxycodone-Acetaminophen] Nausea And Vomiting and Other (See Comments)    Patient states it makes blood pressure drop  too much  . Vicodin [Hydrocodone-Acetaminophen] Nausea And Vomiting    Medications: Outpatient Encounter Medications as of 06/08/2020  Medication Sig  . acyclovir (ZOVIRAX) 200 MG capsule TAKE 2 CAPSULES BY MOUTH TWICE DAILY.  [name] Kitchen albuterol (PROVENTIL) (2.5 MG/3ML) 0.083% nebulizer solution Take 3 mLs (2.5 mg total) by nebulization every 4 (four) hours as needed for wheezing or shortness of breath (please include nebulizer machine, hoses, and mask if needed.).  [name] Kitchen albuterol (VENTOLIN HFA) 108 (90 Base) MCG/ACT inhaler Inhale 2 puffs into the lungs every 4 (four) hours as needed for wheezing or shortness of breath.  [name] Kitchen alendronate (FOSAMAX) 70 MG tablet TAKE 1 TABLET BY MOUTH ONCE A WEEK WITH 8 OUNCE GLASS OF WATER 30 MIN BEFORE BREAKFAST AND OTHER MEDS. DON'T LIE DOWN FOR 30 MINUTES.  [name] Kitchen ALPRAZolam (XANAX) 0.5 MG tablet TAKE 1 TABLET ONCE DAILY FOR ANXIETY.  [name] Kitchen amoxicillin-clavulanate (AUGMENTIN) 875-125 MG tablet Take 1 tablet by mouth 2 (two) times daily.  [name] Kitchen BIOTIN PO Take 1 tablet by mouth daily.  . budesonide-formoterol (SYMBICORT) 160-4.5 MCG/ACT inhaler Inhale 2 puffs into the lungs 2 (two) times daily.  . Cholecalciferol (VITAMIN D) 2000 units CAPS Take by mouth.  . citalopram (CELEXA) 10 MG tablet TAKE (1) TABLET BY MOUTH ONCE DAILY.  . cyclobenzaprine (FLEXERIL) 10 MG tablet One half to one tab PO qHS  . ergocalciferol (VITAMIN D2) 1.25 MG (50000 UT) capsule Take 1 capsule (50,000 Units total) by mouth once a week.  . gabapentin (NEURONTIN) 100 MG capsule TAKE 1 CAPSULE BY MOUTH FOUR TIMES DAILY.  [name] Kitchen  hydrocortisone (ANUSOL-HC) 25 MG suppository Place 1 suppository (25 mg total) rectally 2 (two) times daily as needed for hemorrhoids.  . meloxicam (MOBIC) 15 MG tablet TAKE 1 TABLET IN THE MORNING WITH BREAKFAST FOR 2 WEEKS, THEN DAILY AS NEEDED FOR PAIN.  . metoprolol tartrate (LOPRESSOR) 100 MG tablet TAKE 2 HOURS PRIOR TO CT SCAN  . Multiple Vitamins-Minerals (ZINC PO) Take 1 tablet by mouth  daily.  . pantoprazole (PROTONIX) 40 MG tablet Take 1 tablet (40 mg total) by mouth 2 (two) times daily before a meal.  . Probiotic Product (PROBIOTIC & ACIDOPHILUS EX ST PO) Take by mouth.   . Spacer/Aero-Holding Chambers (AEROCHAMBER MV) inhaler Use as instructed   No facility-administered encounter medications on file as of 06/08/2020.     Family History; Family History  Problem Relation Age of Onset  . Kidney cancer Mother 80  . COPD Mother   . Kidney disease Mother   . Allergic rhinitis Mother   . Asthma Mother   . Heart disease Father   . Allergic rhinitis Father   . COPD Sister   . Depression Sister   . Pancreatic cancer Sister   . Heart disease Brother   . Heart attack Brother   . Heart disease Paternal Uncle   . Diabetes Other        neice  . Thyroid disease Other        neice  . Depression Maternal Aunt   . Depression Maternal Grandfather   . Hypothyroidism Sister   . Hypothyroidism Sister   . Colon cancer Neg Hx   . Rectal cancer Neg Hx   . Stomach cancer Neg Hx   . Colon polyps Neg Hx        Labs:  Recent Results (from the past 2160 hour(s))  Resp Panel by RT-PCR (Flu A&B, Covid) Nasopharyngeal Swab     Status: Abnormal   Collection Time: 05/27/20  4:29 PM   Specimen: Nasopharyngeal Swab; Nasopharyngeal(NP) swabs in vial transport medium  Result Value Ref Range   SARS Coronavirus 2 by RT PCR POSITIVE (A) NEGATIVE    Comment: RESULT CALLED TO, READ BACK BY AND VERIFIED WITH: CUGINO,M @ 1853 ON 05/27/20 BY JUW (NOTE) SARS-CoV-2 target nucleic acids are DETECTED.  The SARS-CoV-2 RNA is generally detectable in upper respiratory specimens during the acute phase of infection. Positive results are indicative of the presence of the identified virus, but do not rule out bacterial infection or co-infection with other pathogens not detected by the test. Clinical correlation with patient history and other diagnostic information is necessary to determine  patient infection status. The expected result is Negative.  Fact Sheet for Patients: EntrepreneurPulse.com.au  Fact Sheet for Healthcare Providers: IncredibleEmployment.be  This test is not yet approved or cleared by the [place] FDA and  has been authorized for detection and/or diagnosis of SARS-CoV-2 by FDA under an Emergency Use Authorization (EUA).  This EUA will remain in effect (meaning this test can be  used) for the duration of  the COVID-19 declaration under Section 564(b)(1) of the Act, 21 U.S.C. section 360bbb-3(b)(1), unless the authorization is terminated or revoked sooner.     Influenza A by PCR NEGATIVE NEGATIVE   Influenza B by PCR NEGATIVE NEGATIVE    Comment: (NOTE) The Xpert Xpress SARS-CoV-2/FLU/RSV plus assay is intended as an aid in the diagnosis of influenza from Nasopharyngeal swab specimens and should not be used as a sole basis for treatment. Nasal washings and aspirates are unacceptable for  Xpert Xpress SARS-CoV-2/FLU/RSV testing.  Fact Sheet for Patients: EntrepreneurPulse.com.au  Fact Sheet for Healthcare Providers: IncredibleEmployment.be  This test is not yet approved or cleared by the [place] FDA and has been authorized for detection and/or diagnosis of SARS-CoV-2 by FDA under an Emergency Use Authorization (EUA). This EUA will remain in effect (meaning this test can be used) for the duration of the COVID-19 declaration under Section 564(b)(1) of the Act, 21 U.S.C. section 360bbb-3(b)(1), unless the authorization is terminated or revoked.  Performed at [facility], [address], [place], [place] [address]   Basic metabolic panel     Status: Abnormal   Collection Time: 05/27/20  4:34 PM  Result Value Ref Range   Sodium 138 135 - 145 mmol/L   Potassium 3.8 3.5 - 5.1 mmol/L   Chloride 100 98 - 111 mmol/L   CO2 29 22 - 32 mmol/L   Glucose, Bld 97 70 - 99  mg/dL    Comment: Glucose reference range applies only to samples taken after fasting for at least 8 hours.   BUN 10 8 - 23 mg/dL   Creatinine, Ser 0.67 0.44 - 1.00 mg/dL   Calcium 8.8 (L) 8.9 - 10.3 mg/dL   GFR, Estimated >60 >60 mL/min    Comment: (NOTE) Calculated using the CKD-EPI Creatinine Equation (2021)    Anion gap 9 5 - 15    Comment: Performed at [facility], [address], [place], [place] [address]  CBC with Differential     Status: Abnormal   Collection Time: 05/27/20  4:34 PM  Result Value Ref Range   WBC 3.7 (L) 4.0 - 10.5 K/uL   RBC 4.15 3.87 - 5.11 MIL/uL   Hemoglobin 13.1 12.0 - 15.0 g/dL   HCT 38.9 36.0 - 46.0 %   MCV 93.7 80.0 - 100.0 fL   MCH 31.6 26.0 - 34.0 pg   MCHC 33.7 30.0 - 36.0 g/dL   RDW 12.2 11.5 - 15.5 %   Platelets 217 150 - 400 K/uL   nRBC 0.0 0.0 - 0.2 %   Neutrophils Relative % 69 %   Neutro Abs 2.5 1.7 - 7.7 K/uL   Lymphocytes Relative 22 %   Lymphs Abs 0.8 0.7 - 4.0 K/uL   Monocytes Relative 9 %   Monocytes Absolute 0.3 0.1 - 1.0 K/uL   Eosinophils Relative 0 %   Eosinophils Absolute 0.0 0.0 - 0.5 K/uL   Basophils Relative 0 %   Basophils Absolute 0.0 0.0 - 0.1 K/uL   Immature Granulocytes 0 %   Abs Immature Granulocytes 0.01 0.00 - 0.07 K/uL    Comment: Performed at [facility], [address], [place], [place] [address]     Mental Status Examination;   Psychiatric Specialty Exam: Physical Exam  Review of Systems  Skin: Negative for rash.  Psychiatric/Behavioral: Negative for suicidal ideas.    There were no vitals taken for this visit.There is no height or weight on file to calculate BMI.  General Appearance:   Eye Contact::    Speech:  coherent  Volume:  Normal  Mood: fair  Affect: reactive  Thought Process: clear . No psychosis  Orientation:  Full (Time, Place, and Person)  Thought Content:  Rumination  Suicidal Thoughts:  No  Homicidal Thoughts:  No  Memory:  Immediate;   Fair Recent;   Fair  Judgement:  Fair   Insight:  Shallow  Psychomotor Activity:  Increased  Concentration:  Fair  Recall:  Fair  Akathisia:  Negative  Handed:  Right  AIMS (if indicated):     Assets:  Desire for Improvement Physical Health Transportation  Sleep:        Assessment: Axis I: bipolar disorder II unspecified or depressed type. Anxiety disorder NOS. Insomnia  Axis II: deferred  Axis III:  Past Medical History:  Diagnosis Date  . Abnormal Pap smear of cervix   . Allergic rhinitis 11/08/2010  . Anxiety   . Asthma   . Bronchitis   . Herpes simplex of female genitalia   . Hyperlipidemia   . Mitral valve prolapse   . Osteoporosis 11/21/2011  . Urticaria     Axis IV: psychosocial   Treatment Plan and Summary:  Bipolar : denies depression, continue gabapentin  GAD: worries related to covid but recovering, follow up with pcp and pulomonary Continue celexa,   Insomnia: fair. .. Work on sleep hygiene Reviewed meds    I discussed the assessment and treatment plan with the patient. The patient was provided an opportunity to ask questions and all were answered. The patient agreed with the plan and demonstrated an understanding of the instructions.   The patient was advised to call back or seek an in-person evaluation if the symptoms worsen or if the condition fails to improve as anticipated. Fu2 - 14m.   Merian Capron, MD 06/08/2020

## 2020-06-21 ENCOUNTER — Ambulatory Visit (INDEPENDENT_AMBULATORY_CARE_PROVIDER_SITE_OTHER): Payer: Medicare HMO | Admitting: Physician Assistant

## 2020-06-21 ENCOUNTER — Other Ambulatory Visit: Payer: Self-pay

## 2020-06-21 ENCOUNTER — Encounter: Payer: Self-pay | Admitting: Physician Assistant

## 2020-06-21 ENCOUNTER — Other Ambulatory Visit: Payer: Self-pay | Admitting: Physician Assistant

## 2020-06-21 VITALS — BP 120/72 | HR 87 | Temp 98.0°F | Wt 114.0 lb

## 2020-06-21 DIAGNOSIS — U071 COVID-19: Secondary | ICD-10-CM

## 2020-06-21 DIAGNOSIS — R7989 Other specified abnormal findings of blood chemistry: Secondary | ICD-10-CM | POA: Diagnosis not present

## 2020-06-21 DIAGNOSIS — J1282 Pneumonia due to coronavirus disease 2019: Secondary | ICD-10-CM

## 2020-06-21 DIAGNOSIS — D72819 Decreased white blood cell count, unspecified: Secondary | ICD-10-CM | POA: Diagnosis not present

## 2020-06-21 DIAGNOSIS — R82998 Other abnormal findings in urine: Secondary | ICD-10-CM | POA: Diagnosis not present

## 2020-06-21 DIAGNOSIS — Z8616 Personal history of COVID-19: Secondary | ICD-10-CM | POA: Diagnosis not present

## 2020-06-21 LAB — POCT URINALYSIS DIPSTICK
Bilirubin, UA: NEGATIVE
Blood, UA: NEGATIVE
Glucose, UA: NEGATIVE
Ketones, UA: NEGATIVE
Leukocytes, UA: NEGATIVE
Nitrite, UA: NEGATIVE
Protein, UA: NEGATIVE
Spec Grav, UA: 1.025 (ref 1.010–1.025)
Urobilinogen, UA: 0.2 E.U./dL
pH, UA: 5.5 (ref 5.0–8.0)

## 2020-06-21 NOTE — Progress Notes (Signed)
Subjective:    Patient ID: Kellie [name], female    DOB: 05/17/1956, 64 y.o.   MRN: [id number]  HPI  Pt is a 64 female with bipolar 1 who presents to the clinic to follow up after covid infection with positive test on 05/27/2020. She did go to ED on 12/8 and dx with covid pneumonia. She is doing much better. She has plenty of energy. She has a slight cough. No fever, chills, body aches. She continues to do the incentive spirometry and use albuterol inhaler as needed. No SOB, fever, body aches.   Last labs in office calcium and WBC were low she would like recheck.   She has noticed for last week her urine is dark yellow instead of the clear it was before. She is drinking plenty of water. No dysuria, increase in urgency or frequency.   She does feel very "high" today. She is loving life and wants to appreciate it more.   .. Active Ambulatory Problems    Diagnosis Date Noted  . GERD (gastroesophageal reflux disease) 09/27/2010  . Allergic rhinitis 11/08/2010  . Insomnia 11/08/2010  . Cough 06/02/2011  . Osteoporosis 11/21/2011  . Borderline intellectual functioning 02/15/2012  . Genital herpes 11/05/2012  . Hemorrhoids 11/05/2012  . Chronic periodontal disease 06/30/2013  . Adhesive capsulitis of right shoulder 11/24/2013  . Encounter for gynecological examination with Papanicolaou smear of cervix 12/08/2013  . Prediabetes 12/14/2013  . Postmenopausal atrophic vaginitis 03/17/2014  . Bipolar disorder with depression (HCC) 06/02/2014  . Family history of renal cancer 10/11/2014  . DDD (degenerative disc disease), lumbar 10/11/2014  . Rhinitis, allergic 10/14/2014  . Near syncope 02/09/2015  . Chronic nasal congestion 03/01/2015  . Vitreous floaters of right eye 03/01/2015  . DDD (degenerative disc disease), cervical 07/05/2015  . Cough, persistent 07/21/2015  . Post-nasal drip 07/21/2015  . Left knee pain 11/09/2015  . Chronic pain of both knees 11/09/2015  . Plantar fasciitis,  bilateral 11/09/2015  . Hoarseness 03/09/2016  . Pulmonary nodule 08/08/2016  . Bipolar 1 disorder with moderate mania (HCC) 09/17/2016  . Panic attacks 09/18/2016  . SOB (shortness of breath) 09/18/2016  . Weakness 10/05/2016  . Family history of pancreatic cancer 02/18/2017  . Diarrhea 02/18/2017  . Loss of weight 02/18/2017  . Unintentional weight loss 05/18/2017  . Elevated LDL cholesterol level 05/18/2017  . Hyperlipidemia 05/18/2017  . Irritable bowel syndrome with both constipation and diarrhea 05/18/2017  . History of loop electrical excision procedure (LEEP) 07/01/2017  . Atypical chest pain 10/22/2017  . Anxiety 10/22/2017  . Aortic insufficiency 01/10/2018  . Non-restorative sleep 01/10/2018  . Snoring 01/10/2018  . Dermatochalasis of both upper eyelids 03/13/2018  . Periodic limb movement 03/13/2018  . Nocturnal leg cramps 03/13/2018  . Vaginal dryness 09/02/2018  . History of diverticulitis 09/16/2018  . Chronic pain of both ankles 01/15/2019  . Cervical stenosis (uterine cervix) 02/13/2019  . Vitamin D insufficiency 02/16/2019  . Plantar fasciitis, right 04/02/2019  . Reactive airway disease that is not asthma 04/17/2019  . Cortical age-related cataract of both eyes 12/22/2018  . Pinguecula of both eyes 12/22/2018  . Nuclear sclerotic cataract of both eyes 12/22/2018  . Anterolisthesis 07/28/2019  . Vitamin D deficiency 12/02/2019  . No energy 12/02/2019  . Rectal bleeding 12/02/2019  . Dysphagia 12/02/2019  . Tenderness of neck 12/02/2019  . Hyperinflation of lungs 01/15/2020  . History of iron deficiency 02/15/2020  . Globin abnormality (HCC) 02/19/2020  . Hiatal hernia  02/19/2020  . Gastroesophageal reflux disease with esophagitis 02/19/2020  . Belching 03/23/2020  . Leukopenia 06/21/2020  . Low serum calcium 06/21/2020   Resolved Ambulatory Problems    Diagnosis Date Noted  . Sinusitis 09/27/2010  . Depression 09/27/2010  . Bronchitis 12/04/2010   . General medical examination 10/18/2011  . Dizziness 11/21/2011  . Knee pain 11/21/2011  . Low back pain 11/21/2011  . Heel pain 07/14/2012  . Thoracic myofascial strain 03/13/2013  . Sinusitis 04/20/2013  . Cough 04/22/2013  . Rotator cuff disorder 08/01/2013  . Right shoulder pain 10/27/2013  . Adhesive capsulitis 11/02/2013  . Pain in joint, shoulder region 11/24/2013  . Bipolar disorder ([place]) 04/29/2014  . Right serous otitis media 08/03/2014  . Acute recurrent maxillary sinusitis 10/14/2014  . Right-sided low back pain without sciatica 10/14/2014  . Sinusitis 02/09/2015  . Sacroiliac joint dysfunction of right side 02/25/2015  . Piriformis syndrome 03/01/2015  . Adhesive capsulitis 07/05/2015  . Radiculitis of right cervical region 07/06/2015  . Drug reaction 09/25/2016  . Mitral valve prolapse 01/10/2018   Past Medical History:  Diagnosis Date  . Abnormal Pap smear of cervix   . Allergic rhinitis 11/08/2010  . Asthma   . Herpes simplex of female genitalia   . Urticaria      Review of Systems  All other systems reviewed and are negative.      Objective:   Physical Exam Vitals reviewed.  Constitutional:      Appearance: Normal appearance.  Cardiovascular:     Rate and Rhythm: Normal rate and regular rhythm.     Pulses: Normal pulses.  Pulmonary:     Effort: Pulmonary effort is normal.     Breath sounds: Rhonchi present.     Comments: Rhonchi heard in the left lower lung Abdominal:     General: There is no distension.     Palpations: Abdomen is soft.     Tenderness: There is no abdominal tenderness. There is no right CVA tenderness or left CVA tenderness.  Neurological:     General: No focal deficit present.     Mental Status: She is alert and oriented to person, place, and time.           Assessment & Plan:  [name] KitchenMarland KitchenTrenell was seen today for dark urine.  Diagnoses and all orders for this visit:  Pneumonia due to COVID-19 virus -     DG Chest 2  View  Dark brown urine -     POCT urinalysis dipstick -     Cancel: Urine Culture -     CBC with Differential/Platelet -     COMPLETE METABOLIC PANEL WITH GFR -     Urine Culture  History of COVID-19 -     DG Chest 2 View -     CBC with Differential/Platelet -     Urine Culture  Low serum calcium -     COMPLETE METABOLIC PANEL WITH GFR  Leukopenia, unspecified type   Will get CXR to follow up after covid pneumonia. Vitals are good and patient reports no SOB. Discussed normal to have some post viral cough.   Discussed labs in ED on 12/8 were good. Pt would like to recheck based on abnormal labs at last visit here.   .. Results for orders placed or performed in visit on 06/21/20  POCT urinalysis dipstick  Result Value Ref Range   Color, UA yellow    Clarity, UA clear    Glucose, UA Negative Negative  Bilirubin, UA neg    Ketones, UA negative    Spec Grav, UA 1.025 1.010 - 1.025   Blood, UA negative    pH, UA 5.5 5.0 - 8.0   Protein, UA Negative Negative   Urobilinogen, UA 0.2 0.2 or 1.0 E.U./dL   Nitrite, UA negative    Leukocytes, UA Negative Negative   Appearance clear    Odor none    Negative for leukocytes/blood/nitrates. Will culture. We did not see dark urine it was clear and yellow. Encouraged proper hydration.   I do feel like she is manic today. Encouraged to discuss with Dr. De Nurse. Pt declines any medication today. She feels "good". She is "excited about life and cannot wait to do new things". My biggest worry is what is on the other side of mania is depression. Discussed with patient.

## 2020-06-22 ENCOUNTER — Encounter: Payer: Self-pay | Admitting: Physician Assistant

## 2020-06-22 DIAGNOSIS — R7989 Other specified abnormal findings of blood chemistry: Secondary | ICD-10-CM | POA: Diagnosis not present

## 2020-06-22 DIAGNOSIS — Z8616 Personal history of COVID-19: Secondary | ICD-10-CM | POA: Diagnosis not present

## 2020-06-22 DIAGNOSIS — R82998 Other abnormal findings in urine: Secondary | ICD-10-CM | POA: Diagnosis not present

## 2020-06-22 LAB — CBC WITH DIFFERENTIAL/PLATELET
Absolute Monocytes: 392 cells/uL (ref 200–950)
Basophils Absolute: 52 cells/uL (ref 0–200)
Basophils Relative: 1.3 %
Eosinophils Absolute: 92 cells/uL (ref 15–500)
Eosinophils Relative: 2.3 %
HCT: 34 % — ABNORMAL LOW (ref 35.0–45.0)
Hemoglobin: 11.4 g/dL — ABNORMAL LOW (ref 11.7–15.5)
Lymphs Abs: 1020 cells/uL (ref 850–3900)
MCH: 31.3 pg (ref 27.0–33.0)
MCHC: 33.5 g/dL (ref 32.0–36.0)
MCV: 93.4 fL (ref 80.0–100.0)
MPV: 10.7 fL (ref 7.5–12.5)
Monocytes Relative: 9.8 %
Neutro Abs: 2444 cells/uL (ref 1500–7800)
Neutrophils Relative %: 61.1 %
Platelets: 298 10*3/uL (ref 140–400)
RBC: 3.64 10*6/uL — ABNORMAL LOW (ref 3.80–5.10)
RDW: 12.2 % (ref 11.0–15.0)
Total Lymphocyte: 25.5 %
WBC: 4 10*3/uL (ref 3.8–10.8)

## 2020-06-22 LAB — COMPLETE METABOLIC PANEL WITH GFR
AG Ratio: 2.1 (calc) (ref 1.0–2.5)
ALT: 20 U/L (ref 6–29)
AST: 19 U/L (ref 10–35)
Albumin: 4.2 g/dL (ref 3.6–5.1)
Alkaline phosphatase (APISO): 44 U/L (ref 37–153)
BUN: 13 mg/dL (ref 7–25)
CO2: 30 mmol/L (ref 20–32)
Calcium: 9.5 mg/dL (ref 8.6–10.4)
Chloride: 103 mmol/L (ref 98–110)
Creat: 0.81 mg/dL (ref 0.50–0.99)
GFR, Est African American: 88 mL/min/{1.73_m2} (ref >=60)
GFR, Est Non African American: 76 mL/min/{1.73_m2} (ref >=60)
Globulin: 2 g/dL (calc) (ref 1.9–3.7)
Glucose, Bld: 82 mg/dL (ref 65–139)
Potassium: 4.4 mmol/L (ref 3.5–5.3)
Sodium: 139 mmol/L (ref 135–146)
Total Bilirubin: 0.5 mg/dL (ref 0.2–1.2)
Total Protein: 6.2 g/dL (ref 6.1–8.1)

## 2020-06-22 LAB — URINE CULTURE
MICRO NUMBER:: 11362021
Result:: NO GROWTH
SPECIMEN QUALITY:: ADEQUATE

## 2020-06-22 LAB — HOUSE ACCOUNT TRACKING

## 2020-06-23 ENCOUNTER — Encounter: Payer: Self-pay | Admitting: Physician Assistant

## 2020-06-23 DIAGNOSIS — N644 Mastodynia: Secondary | ICD-10-CM

## 2020-06-23 NOTE — Progress Notes (Signed)
Kellie [name],   Kidney and liver look great. Calcium normal. Your hemoglobin is a little low. Not terrible but a little low. Starting oral iron once or twice a day could help. At the very least increase iron rich foods in diet and recheck in 2 months.

## 2020-06-29 ENCOUNTER — Encounter: Payer: Self-pay | Admitting: Physician Assistant

## 2020-06-29 ENCOUNTER — Other Ambulatory Visit (HOSPITAL_COMMUNITY): Payer: Self-pay | Admitting: Psychiatry

## 2020-06-29 ENCOUNTER — Other Ambulatory Visit: Payer: Self-pay | Admitting: Physician Assistant

## 2020-06-29 ENCOUNTER — Ambulatory Visit (HOSPITAL_COMMUNITY)
Admission: RE | Admit: 2020-06-29 | Discharge: 2020-06-29 | Disposition: A | Discharge status: Home / Self Care | Payer: Medicare HMO | Source: Ambulatory Visit | Attending: Physician Assistant | Admitting: Physician Assistant

## 2020-06-29 ENCOUNTER — Other Ambulatory Visit: Payer: Self-pay

## 2020-06-29 DIAGNOSIS — U071 COVID-19: Secondary | ICD-10-CM | POA: Insufficient documentation

## 2020-06-29 DIAGNOSIS — J1282 Pneumonia due to coronavirus disease 2019: Secondary | ICD-10-CM | POA: Diagnosis not present

## 2020-06-29 DIAGNOSIS — J189 Pneumonia, unspecified organism: Secondary | ICD-10-CM | POA: Diagnosis not present

## 2020-06-29 NOTE — Progress Notes (Signed)
No growth on urine culture.

## 2020-06-29 NOTE — Telephone Encounter (Signed)
Pt stated she was not taking any longer? Has this changed?

## 2020-06-29 NOTE — Telephone Encounter (Signed)
Last written 05/31/2020 #30 no refills No longer on current med list

## 2020-06-29 NOTE — Progress Notes (Signed)
Pneumonia has resolved. Chest xray looks good.

## 2020-06-29 NOTE — Telephone Encounter (Signed)
See note from patient

## 2020-06-29 NOTE — Telephone Encounter (Signed)
See note

## 2020-07-11 DIAGNOSIS — N644 Mastodynia: Secondary | ICD-10-CM | POA: Insufficient documentation

## 2020-07-11 NOTE — Addendum Note (Signed)
Addended by: Donella Stade on: 07/11/2020 08:42 AM   Modules accepted: Orders

## 2020-07-26 DIAGNOSIS — J1282 Pneumonia due to coronavirus disease 2019: Secondary | ICD-10-CM | POA: Diagnosis not present

## 2020-07-26 DIAGNOSIS — U071 COVID-19: Secondary | ICD-10-CM | POA: Diagnosis not present

## 2020-08-01 ENCOUNTER — Other Ambulatory Visit: Payer: Self-pay | Admitting: Physician Assistant

## 2020-08-01 ENCOUNTER — Other Ambulatory Visit: Payer: Self-pay | Admitting: Gastroenterology

## 2020-08-01 ENCOUNTER — Other Ambulatory Visit (HOSPITAL_COMMUNITY): Payer: Self-pay | Admitting: Psychiatry

## 2020-08-01 NOTE — Telephone Encounter (Signed)
Last written 06/29/2020 #30 no refills Last appt 06/21/2020

## 2020-08-01 NOTE — Telephone Encounter (Signed)
Spoke with patient. She states since having Covid this last month she has been using every night for sleep, but will limit this to only when absolutely necessary in the future. She is out of medication at this time.

## 2020-08-01 NOTE — Telephone Encounter (Signed)
Can we call patient and stress not to take every day. This is an as needed medication. Does she really need refill in that last month.

## 2020-08-09 ENCOUNTER — Other Ambulatory Visit: Payer: Self-pay

## 2020-08-09 ENCOUNTER — Ambulatory Visit (HOSPITAL_COMMUNITY)
Admission: RE | Admit: 2020-08-09 | Discharge: 2020-08-09 | Disposition: A | Discharge status: Home / Self Care | Payer: Medicare HMO | Source: Ambulatory Visit | Attending: Physician Assistant | Admitting: Physician Assistant

## 2020-08-09 ENCOUNTER — Other Ambulatory Visit (HOSPITAL_COMMUNITY): Payer: Medicare HMO

## 2020-08-09 ENCOUNTER — Ambulatory Visit (HOSPITAL_COMMUNITY): Admission: RE | Admit: 2020-08-09 | Payer: Medicare HMO | Source: Ambulatory Visit

## 2020-08-09 DIAGNOSIS — R922 Inconclusive mammogram: Secondary | ICD-10-CM | POA: Diagnosis not present

## 2020-08-09 DIAGNOSIS — N644 Mastodynia: Secondary | ICD-10-CM | POA: Diagnosis not present

## 2020-08-09 NOTE — Telephone Encounter (Signed)
Normal mammogram. Follow up screening mammogram in one year.

## 2020-08-29 ENCOUNTER — Other Ambulatory Visit (HOSPITAL_COMMUNITY): Payer: Self-pay | Admitting: Psychiatry

## 2020-08-29 ENCOUNTER — Other Ambulatory Visit: Payer: Self-pay | Admitting: Physician Assistant

## 2020-08-29 ENCOUNTER — Other Ambulatory Visit: Payer: Self-pay | Admitting: Gastroenterology

## 2020-08-29 DIAGNOSIS — M810 Age-related osteoporosis without current pathological fracture: Secondary | ICD-10-CM

## 2020-09-06 DIAGNOSIS — J1282 Pneumonia due to coronavirus disease 2019: Secondary | ICD-10-CM | POA: Diagnosis not present

## 2020-09-06 DIAGNOSIS — U071 COVID-19: Secondary | ICD-10-CM | POA: Diagnosis not present

## 2020-09-12 ENCOUNTER — Telehealth (HOSPITAL_COMMUNITY): Payer: Medicare HMO | Admitting: Psychiatry

## 2020-09-14 ENCOUNTER — Ambulatory Visit: Payer: Medicare HMO | Admitting: Physician Assistant

## 2020-09-15 ENCOUNTER — Encounter (HOSPITAL_COMMUNITY): Payer: Self-pay | Admitting: Psychiatry

## 2020-09-15 ENCOUNTER — Telehealth (INDEPENDENT_AMBULATORY_CARE_PROVIDER_SITE_OTHER): Payer: Medicare HMO | Admitting: Psychiatry

## 2020-09-15 DIAGNOSIS — F419 Anxiety disorder, unspecified: Secondary | ICD-10-CM | POA: Diagnosis not present

## 2020-09-15 DIAGNOSIS — F319 Bipolar disorder, unspecified: Secondary | ICD-10-CM

## 2020-09-15 MED ORDER — CITALOPRAM HYDROBROMIDE 10 MG PO TABS
ORAL_TABLET | ORAL | 2 refills | Status: DC
Start: 2020-09-15 — End: 2020-12-27

## 2020-09-15 MED ORDER — GABAPENTIN 100 MG PO CAPS: 100.0000 mg | ORAL_CAPSULE | Freq: Three times a day (TID) | ORAL | 2 refills | Status: DC

## 2020-09-15 NOTE — Progress Notes (Signed)
Patient ID: Kellie [name], female   DOB: 1956/01/19, 65 y.o.   MRN: [id number]   [facility] Follow up Outpatient visit  Kellie [name] [id number] 65 y.o.  09/15/2020 8:41 AM  Chief Complaint:   Follow up for Bipolar  And anxiety.  History of Present Illness:     Virtual Visit via Telephone Note  I connected with [name] on 09/15/20 at  8:30 AM EDT by telephone and verified that I am speaking with the correct person using two identifiers.  Location: Patient: home Provider: office   I discussed the limitations, risks, security and privacy concerns of performing an evaluation and management service by telephone and the availability of in person appointments. I also discussed with the patient that there may be a patient responsible charge related to this service. The patient expressed understanding and agreed to proceed.      I discussed the assessment and treatment plan with the patient. The patient was provided an opportunity to ask questions and all were answered. The patient agreed with the plan and demonstrated an understanding of the instructions.   The patient was advised to call back or seek an in-person evaluation if the symptoms worsen or if the condition fails to improve as anticipated.  I provided 11  minutes of non-face-to-face time during this encounter.  Patient doing fair, had deaths in family, but has support from sister Recovering from covid and fatigue better Gaba helps mood,   Takes xanax at night from primary care    Modifying factor: family Duration more then 5 years  Past Psychiatric History/Hospitalization(s) denies  Hospitalization for psychiatric illness: No History of Electroconvulsive Shock Therapy: No Prior Suicide Attempts: No  Medical History; Past Medical History:  Diagnosis Date  . Abnormal Pap smear of cervix   . Allergic rhinitis 11/08/2010  . Anxiety   . Asthma   . Bronchitis   . Herpes simplex of female  genitalia   . Hyperlipidemia   . Mitral valve prolapse   . Osteoporosis 11/21/2011  . Urticaria     Allergies: Allergies  Allergen Reactions  . Hydrocodone Nausea And Vomiting  . Abilify [Aripiprazole]     Nausea/vomiting/weakness/shaky  . Codeine Nausea And Vomiting  . Morphine And Related Nausea And Vomiting  . Percocet [Oxycodone-Acetaminophen] Nausea And Vomiting and Other (See Comments)    Patient states it makes blood pressure drop too much  . Vicodin [Hydrocodone-Acetaminophen] Nausea And Vomiting    Medications: Outpatient Encounter Medications as of 09/15/2020  Medication Sig  . gabapentin (NEURONTIN) 100 MG capsule Take 1 capsule (100 mg total) by mouth 3 (three) times daily.  [name] Kitchen acyclovir (ZOVIRAX) 200 MG capsule TAKE 2 CAPSULES BY MOUTH TWICE DAILY.  [name] Kitchen albuterol (PROVENTIL) (2.5 MG/3ML) 0.083% nebulizer solution Take 3 mLs (2.5 mg total) by nebulization every 4 (four) hours as needed for wheezing or shortness of breath (please include nebulizer machine, hoses, and mask if needed.).  [name] Kitchen albuterol (VENTOLIN HFA) 108 (90 Base) MCG/ACT inhaler Inhale 2 puffs into the lungs every 4 (four) hours as needed for wheezing or shortness of breath.  [name] Kitchen alendronate (FOSAMAX) 70 MG tablet TAKE 1 TABLET BY MOUTH ONCE A WEEK WITH 8 OUNCE GLASS OF WATER 30 MIN BEFORE BREAKFAST AND OTHER MEDS. DON'T LIE DOWN FOR 30 MINUTES.  [name] Kitchen ALPRAZolam (XANAX) 0.5 MG tablet TAKE 1 TABLET ONCE DAILY FOR ANXIETY.  [name] Kitchen BIOTIN PO Take 1 tablet by mouth daily.  . budesonide-formoterol (SYMBICORT) 160-4.5 MCG/ACT inhaler Inhale 2  puffs into the lungs 2 (two) times daily.  . Cholecalciferol (VITAMIN D) 2000 units CAPS Take by mouth.  . citalopram (CELEXA) 10 MG tablet Take  One a day  . cyclobenzaprine (FLEXERIL) 10 MG tablet One half to one tab PO qHS  . gabapentin (NEURONTIN) 100 MG capsule TAKE 1 CAPSULE BY MOUTH FOUR TIMES DAILY.  . hydrocortisone (ANUSOL-HC) 25 MG suppository Place 1 suppository (25 mg total)  rectally 2 (two) times daily as needed for hemorrhoids.  . meloxicam (MOBIC) 15 MG tablet TAKE 1 TABLET IN THE MORNING WITH BREAKFAST FOR 2 WEEKS, THEN DAILY AS NEEDED FOR PAIN.  . Multiple Vitamins-Minerals (ZINC PO) Take 1 tablet by mouth daily.  . pantoprazole (PROTONIX) 40 MG tablet TAKE (1) TABLET BY MOUTH TWICE DAILY.  . Probiotic Product (PROBIOTIC & ACIDOPHILUS EX ST PO) Take by mouth.   . Spacer/Aero-Holding Chambers (AEROCHAMBER MV) inhaler Use as instructed  . [DISCONTINUED] citalopram (CELEXA) 10 MG tablet TAKE (1) TABLET BY MOUTH ONCE DAILY.   No facility-administered encounter medications on file as of 09/15/2020.     Family History; Family History  Problem Relation Age of Onset  . Kidney cancer Mother 49  . COPD Mother   . Kidney disease Mother   . Allergic rhinitis Mother   . Asthma Mother   . Heart disease Father   . Allergic rhinitis Father   . COPD Sister   . Depression Sister   . Pancreatic cancer Sister   . Heart disease Brother   . Heart attack Brother   . Heart disease Paternal Uncle   . Diabetes Other        neice  . Thyroid disease Other        neice  . Depression Maternal Aunt   . Depression Maternal Grandfather   . Hypothyroidism Sister   . Hypothyroidism Sister   . Colon cancer Neg Hx   . Rectal cancer Neg Hx   . Stomach cancer Neg Hx   . Colon polyps Neg Hx        Labs:  Recent Results (from the past 2160 hour(s))  POCT urinalysis dipstick     Status: None   Collection Time: 06/21/20  8:11 AM  Result Value Ref Range   Color, UA yellow    Clarity, UA clear    Glucose, UA Negative Negative   Bilirubin, UA neg    Ketones, UA negative    Spec Grav, UA 1.025 1.010 - 1.025   Blood, UA negative    pH, UA 5.5 5.0 - 8.0   Protein, UA Negative Negative   Urobilinogen, UA 0.2 0.2 or 1.0 E.U./dL   Nitrite, UA negative    Leukocytes, UA Negative Negative   Appearance clear    Odor none   House Account Tracking only     Status: None    Collection Time: 06/21/20  9:03 AM  Result Value Ref Range   Tracking House Account      Comment: We were unable to identify an account number for the order submitted. If you do not have a [facility]  account number or if your account information needs  to be updated please call [phone]) for assistance. . To prevent delays in testing and processing of your orders please provide the following information for this order and with every additional order submitted: [facility] account number and account name  Client address Client phone and fax number NPI number of ordering physician along with the physician name.  Urine Culture     Status: None   Collection Time: 06/21/20  9:03 AM  Result Value Ref Range   MICRO NUMBER: [id number]    SPECIMEN QUALITY: Adequate    Sample Source URINE    STATUS: FINAL    Result: No Growth   CBC with Differential/Platelet     Status: Abnormal   Collection Time: 06/22/20 12:00 AM  Result Value Ref Range   WBC 4.0 3.8 - 10.8 Thousand/uL   RBC 3.64 (L) 3.80 - 5.10 Million/uL   Hemoglobin 11.4 (L) 11.7 - 15.5 g/dL   HCT 34.0 (L) 35.0 - 45.0 %   MCV 93.4 80.0 - 100.0 fL   MCH 31.3 27.0 - 33.0 pg   MCHC 33.5 32.0 - 36.0 g/dL   RDW 12.2 11.0 - 15.0 %   Platelets 298 140 - 400 Thousand/uL   MPV 10.7 7.5 - 12.5 fL   Neutro Abs 2,444 1,500 - 7,800 cells/uL   Lymphs Abs 1,020 850 - 3,900 cells/uL   Absolute Monocytes 392 200 - 950 cells/uL   Eosinophils Absolute 92 15 - 500 cells/uL   Basophils Absolute 52 0 - 200 cells/uL   Neutrophils Relative % 61.1 %   Total Lymphocyte 25.5 %   Monocytes Relative 9.8 %   Eosinophils Relative 2.3 %   Basophils Relative 1.3 %  COMPLETE METABOLIC PANEL WITH GFR     Status: None   Collection Time: 06/22/20 12:00 AM  Result Value Ref Range   Glucose, Bld 82 65 - 139 mg/dL    Comment: .        Non-fasting reference interval .    BUN 13 7 - 25 mg/dL   Creat 0.81 0.50 - 0.99 mg/dL    Comment:  For patients >40 years of age, the reference limit for Creatinine is approximately 13% higher for people identified as African-American. .    GFR, Est Non African American 76 > OR = 60 mL/min/1.16m2   GFR, Est African American 88 > OR = 60 mL/min/1.2m2   BUN/Creatinine Ratio NOT APPLICABLE 6 - 22 (calc)   Sodium 139 135 - 146 mmol/L   Potassium 4.4 3.5 - 5.3 mmol/L   Chloride 103 98 - 110 mmol/L   CO2 30 20 - 32 mmol/L   Calcium 9.5 8.6 - 10.4 mg/dL   Total Protein 6.2 6.1 - 8.1 g/dL   Albumin 4.2 3.6 - 5.1 g/dL   Globulin 2.0 1.9 - 3.7 g/dL (calc)   AG Ratio 2.1 1.0 - 2.5 (calc)   Total Bilirubin 0.5 0.2 - 1.2 mg/dL   Alkaline phosphatase (APISO) 44 37 - 153 U/L   AST 19 10 - 35 U/L   ALT 20 6 - 29 U/L     Mental Status Examination;   Psychiatric Specialty Exam: Physical Exam  Review of Systems  Cardiovascular: Negative for chest pain.  Psychiatric/Behavioral: Negative for suicidal ideas.    There were no vitals taken for this visit.There is no height or weight on file to calculate BMI.  General Appearance:   Eye Contact::    Speech:  coherent  Volume:  Normal  Mood: fair  Affect: reactive  Thought Process: clear . No psychosis  Orientation:  Full (Time, Place, and Person)  Thought Content:  Rumination  Suicidal Thoughts:  No  Homicidal Thoughts:  No  Memory:  Immediate;   Fair Recent;   Fair  Judgement:  Fair  Insight:  Shallow  Psychomotor Activity:  Increased  Concentration:  Fair  Recall:  Fair  Akathisia:  Negative  Handed:  Right  AIMS (if indicated):     Assets:  Desire for Improvement Physical Health Transportation  Sleep:        Assessment: Axis I: bipolar disorder II unspecified or depressed type. Anxiety disorder NOS. Insomnia  Axis II: deferred  Axis III:  Past Medical History:  Diagnosis Date  . Abnormal Pap smear of cervix   . Allergic rhinitis 11/08/2010  . Anxiety   . Asthma   . Bronchitis   . Herpes simplex of female  genitalia   . Hyperlipidemia   . Mitral valve prolapse   . Osteoporosis 11/21/2011  . Urticaria     Axis IV: psychosocial   Treatment Plan and Summary:  Bipolar : baseline, continue gaba tid   BVP:LWUZ, continue celexa  Insomnia: fair. .. Work on sleep hygiene Reviewed meds  Fu 69m in office Merian Capron, MD 09/15/2020

## 2020-09-19 ENCOUNTER — Ambulatory Visit (HOSPITAL_COMMUNITY): Payer: Medicare HMO | Attending: Internal Medicine

## 2020-09-19 ENCOUNTER — Other Ambulatory Visit: Payer: Self-pay

## 2020-09-19 DIAGNOSIS — R0600 Dyspnea, unspecified: Secondary | ICD-10-CM | POA: Diagnosis not present

## 2020-09-19 LAB — ECHOCARDIOGRAM COMPLETE
Area-P 1/2: 2.05 cm2
P 1/2 time: 617 msec
S' Lateral: 2.4 cm

## 2020-09-20 ENCOUNTER — Encounter: Payer: Self-pay | Admitting: Physician Assistant

## 2020-09-20 ENCOUNTER — Ambulatory Visit (INDEPENDENT_AMBULATORY_CARE_PROVIDER_SITE_OTHER): Payer: Medicare HMO | Admitting: Physician Assistant

## 2020-09-20 VITALS — BP 101/62 | HR 78 | Ht 66.0 in | Wt 113.0 lb

## 2020-09-20 DIAGNOSIS — M79605 Pain in left leg: Secondary | ICD-10-CM

## 2020-09-20 DIAGNOSIS — M25471 Effusion, right ankle: Secondary | ICD-10-CM

## 2020-09-20 DIAGNOSIS — M25472 Effusion, left ankle: Secondary | ICD-10-CM

## 2020-09-20 DIAGNOSIS — R202 Paresthesia of skin: Secondary | ICD-10-CM

## 2020-09-20 DIAGNOSIS — F419 Anxiety disorder, unspecified: Secondary | ICD-10-CM | POA: Diagnosis not present

## 2020-09-20 DIAGNOSIS — M79604 Pain in right leg: Secondary | ICD-10-CM | POA: Diagnosis not present

## 2020-09-20 DIAGNOSIS — E7800 Pure hypercholesterolemia, unspecified: Secondary | ICD-10-CM

## 2020-09-20 DIAGNOSIS — R4589 Other symptoms and signs involving emotional state: Secondary | ICD-10-CM | POA: Insufficient documentation

## 2020-09-20 DIAGNOSIS — I351 Nonrheumatic aortic (valve) insufficiency: Secondary | ICD-10-CM | POA: Insufficient documentation

## 2020-09-20 DIAGNOSIS — F418 Other specified anxiety disorders: Secondary | ICD-10-CM | POA: Diagnosis not present

## 2020-09-20 DIAGNOSIS — Z823 Family history of stroke: Secondary | ICD-10-CM

## 2020-09-20 DIAGNOSIS — R200 Anesthesia of skin: Secondary | ICD-10-CM

## 2020-09-20 MED ORDER — ALPRAZOLAM 0.5 MG PO TABS: ORAL_TABLET | ORAL | 5 refills | Status: DC

## 2020-09-20 MED ORDER — ATORVASTATIN CALCIUM 10 MG PO TABS: 10.0000 mg | ORAL_TABLET | Freq: Every day | ORAL | 3 refills | Status: DC

## 2020-09-20 MED ORDER — GABAPENTIN 100 MG PO CAPS: ORAL_CAPSULE | ORAL | 1 refills | Status: DC

## 2020-09-20 NOTE — Progress Notes (Signed)
Subjective:    Patient ID: Kellie [name], female    DOB: 1955/10/28, 65 y.o.   MRN: [id number]  HPI  Patient is a 65 year old female with aortic valve insufficiency, IBS, cervical and lumbar degenerative disc disease, plantar fasciitis, bipolar 1 and anxiety who presents to the clinic with multiple concerns today.  Patient had an echocardiogram yesterday and concerned about the results.  She does mention she has had chest pain intermittently but cardiology is working up.  Last night chest pain woke her up out of her sleep.  She is also having bilateral leg pain, numbness and tingling in her feet, bilateral leg swelling.  Pain tends to jump around and come and go.  She noticed it starting about 1 month ago after she was doing physical therapy for her low back pain.  Her low back pain seems to be better.  At times her legs are sensitive to touch.  She has noticed some bilateral ankle edema intermittently.  She is concerned about her arteries since her sister just had a stroke.  She does take gabapentin 100 mg 3 times a day. She does feel like leg pain worsens with any walking.   Patient is very anxious about her health.  She is taking Xanax about 15 a month.  She does need a refill. .. Active Ambulatory Problems    Diagnosis Date Noted  . GERD (gastroesophageal reflux disease) 09/27/2010  . Allergic rhinitis 11/08/2010  . Insomnia 11/08/2010  . Cough 06/02/2011  . Osteoporosis 11/21/2011  . Borderline intellectual functioning 02/15/2012  . Genital herpes 11/05/2012  . Hemorrhoids 11/05/2012  . Chronic periodontal disease 06/30/2013  . Adhesive capsulitis of right shoulder 11/24/2013  . Encounter for gynecological examination with Papanicolaou smear of cervix 12/08/2013  . Prediabetes 12/14/2013  . Postmenopausal atrophic vaginitis 03/17/2014  . Bipolar disorder with depression ([place]) 06/02/2014  . Family history of renal cancer 10/11/2014  . DDD (degenerative disc disease), lumbar  10/11/2014  . Rhinitis, allergic 10/14/2014  . Near syncope 02/09/2015  . Chronic nasal congestion 03/01/2015  . Vitreous floaters of right eye 03/01/2015  . DDD (degenerative disc disease), cervical 07/05/2015  . Cough, persistent 07/21/2015  . Post-nasal drip 07/21/2015  . Left knee pain 11/09/2015  . Chronic pain of both knees 11/09/2015  . Plantar fasciitis, bilateral 11/09/2015  . Hoarseness 03/09/2016  . Pulmonary nodule 08/08/2016  . Bipolar 1 disorder with moderate mania ([place]) 09/17/2016  . Panic attacks 09/18/2016  . SOB (shortness of breath) 09/18/2016  . Weakness 10/05/2016  . Family history of pancreatic cancer 02/18/2017  . Diarrhea 02/18/2017  . Loss of weight 02/18/2017  . Unintentional weight loss 05/18/2017  . Elevated LDL cholesterol level 05/18/2017  . Hyperlipidemia 05/18/2017  . Irritable bowel syndrome with both constipation and diarrhea 05/18/2017  . History of loop electrical excision procedure (LEEP) 07/01/2017  . Atypical chest pain 10/22/2017  . Anxiety 10/22/2017  . Aortic insufficiency 01/10/2018  . Non-restorative sleep 01/10/2018  . Snoring 01/10/2018  . Dermatochalasis of both upper eyelids 03/13/2018  . Periodic limb movement 03/13/2018  . Nocturnal leg cramps 03/13/2018  . Vaginal dryness 09/02/2018  . History of diverticulitis 09/16/2018  . Chronic pain of both ankles 01/15/2019  . Cervical stenosis (uterine cervix) 02/13/2019  . Vitamin D insufficiency 02/16/2019  . Plantar fasciitis, right 04/02/2019  . Reactive airway disease that is not asthma 04/17/2019  . Cortical age-related cataract of both eyes 12/22/2018  . Pinguecula of both eyes 12/22/2018  .  Nuclear sclerotic cataract of both eyes 12/22/2018  . Anterolisthesis 07/28/2019  . Vitamin D deficiency 12/02/2019  . No energy 12/02/2019  . Rectal bleeding 12/02/2019  . Dysphagia 12/02/2019  . Tenderness of neck 12/02/2019  . Hyperinflation of lungs 01/15/2020  . History of iron  deficiency 02/15/2020  . Globin abnormality ([place]) 02/19/2020  . Hiatal hernia 02/19/2020  . Gastroesophageal reflux disease with esophagitis 02/19/2020  . Belching 03/23/2020  . Leukopenia 06/21/2020  . Low serum calcium 06/21/2020  . Breast pain, right 07/11/2020  . Nonrheumatic aortic valve insufficiency 09/20/2020  . Bilateral leg pain 09/20/2020  . Numbness and tingling of both feet 09/20/2020  . Family history of stroke 09/20/2020  . Ankle edema, bilateral 09/20/2020  . Anxiety about health 09/20/2020   Resolved Ambulatory Problems    Diagnosis Date Noted  . Sinusitis 09/27/2010  . Depression 09/27/2010  . Bronchitis 12/04/2010  . General medical examination 10/18/2011  . Dizziness 11/21/2011  . Knee pain 11/21/2011  . Low back pain 11/21/2011  . Heel pain 07/14/2012  . Thoracic myofascial strain 03/13/2013  . Sinusitis 04/20/2013  . Cough 04/22/2013  . Rotator cuff disorder 08/01/2013  . Right shoulder pain 10/27/2013  . Adhesive capsulitis 11/02/2013  . Pain in joint, shoulder region 11/24/2013  . Bipolar disorder ([place]) 04/29/2014  . Right serous otitis media 08/03/2014  . Acute recurrent maxillary sinusitis 10/14/2014  . Right-sided low back pain without sciatica 10/14/2014  . Sinusitis 02/09/2015  . Sacroiliac joint dysfunction of right side 02/25/2015  . Piriformis syndrome 03/01/2015  . Adhesive capsulitis 07/05/2015  . Radiculitis of right cervical region 07/06/2015  . Drug reaction 09/25/2016  . Mitral valve prolapse 01/10/2018   Past Medical History:  Diagnosis Date  . Abnormal Pap smear of cervix   . Allergic rhinitis 11/08/2010  . Asthma   . Herpes simplex of female genitalia   . Urticaria         Review of Systems See HPI.     Objective:   Physical Exam Vitals reviewed.  Constitutional:      Appearance: Normal appearance.  Neck:     Vascular: No carotid bruit.  Cardiovascular:     Rate and Rhythm: Normal rate and regular rhythm.      Pulses: Normal pulses.     Heart sounds: Murmur heard.    Pulmonary:     Effort: Pulmonary effort is normal.     Breath sounds: Normal breath sounds. No wheezing or rhonchi.  Abdominal:     General: There is no distension.     Palpations: Abdomen is soft.     Tenderness: There is no abdominal tenderness. There is no guarding or rebound.  Musculoskeletal:     Cervical back: Normal range of motion and neck supple.     Right lower leg: Edema present.     Left lower leg: Edema present.     Comments: Scant edema around ankles. Strength lower ext 5/5.  2+ DP pulses, bilaterally.  Multiple toes with nails that are thick dystrophic.   Neurological:     General: No focal deficit present.     Mental Status: She is alert and oriented to person, place, and time.  Psychiatric:     Comments: Anxiety.            Assessment & Plan:  [name] KitchenMarland KitchenMischell was seen today for follow-up.  Diagnoses and all orders for this visit:  Numbness and tingling of both feet -     B12 and  Folate Panel -     COMPLETE METABOLIC PANEL WITH GFR -     CBC with Differential/Platelet -     Fe+TIBC+Fer -     gabapentin (NEURONTIN) 100 MG capsule; Take 1 to 3 tablets up to three times day. -     Sed Rate (ESR) -     C-reactive protein -     TSH -     NCV with EMG(electromyography); Future  Bilateral leg pain -     B12 and Folate Panel -     COMPLETE METABOLIC PANEL WITH GFR -     CBC with Differential/Platelet -     Fe+TIBC+Fer -     gabapentin (NEURONTIN) 100 MG capsule; Take 1 to 3 tablets up to three times day. -     Sed Rate (ESR) -     C-reactive protein -     TSH -     NCV with EMG(electromyography); Future -     VAS [place] ABI WITH/WO TBI; Future  Nonrheumatic aortic valve insufficiency  Family history of stroke -     atorvastatin (LIPITOR) 10 MG tablet; Take 1 tablet (10 mg total) by mouth daily. -     US Carotid Bilateral; Future -     VAS [place] ABI WITH/WO TBI; Future  Elevated LDL cholesterol  level -     atorvastatin (LIPITOR) 10 MG tablet; Take 1 tablet (10 mg total) by mouth daily. -     US Carotid Bilateral; Future -     NCV with EMG(electromyography); Future -     VAS [place] ABI WITH/WO TBI; Future  Ankle edema, bilateral  Anxiety about health -     ALPRAZolam (XANAX) 0.5 MG tablet; TAKE 1 TABLET ONCE DAILY FOR ANXIETY.  Anxiety -     ALPRAZolam (XANAX) 0.5 MG tablet; TAKE 1 TABLET ONCE DAILY FOR ANXIETY.   Pt is worried about her bilateral leg pain and swelling.  Swelling was very scant today and reassured her. Circulation appeared good today. If ABI good then can use as needed compression.  Keep legs elevated when you can and watch salt in diet.  Will get ABI/EMG to rule out PVD vs Neuropathy.  Family hx of stroke. Ordered carotid dopplers to screen.  LDL 135. Decided to start lipitor 1m daily. Discussed risk vs benefit.  Increased gabapentin to 3059mTID.  Certainly some of her foot pain could be the plantar fasciitis flare.  Labs ordered for screening.   Mild to moderate aortic insuffiencey. [facility]   Follow up after imaging and labs done.

## 2020-09-20 NOTE — Patient Instructions (Addendum)
ABI and nerve conduction for leg.  Carotid doppler for stroke prevention Get labs today.  Follow up cardiology. Go over echo.  Increased gabapentin.

## 2020-09-21 LAB — COMPLETE METABOLIC PANEL WITH GFR
AG Ratio: 2.1 (calc) (ref 1.0–2.5)
ALT: 13 U/L (ref 6–29)
AST: 18 U/L (ref 10–35)
Albumin: 4.4 g/dL (ref 3.6–5.1)
Alkaline phosphatase (APISO): 43 U/L (ref 37–153)
BUN: 19 mg/dL (ref 7–25)
CO2: 29 mmol/L (ref 20–32)
Calcium: 9.7 mg/dL (ref 8.6–10.4)
Chloride: 103 mmol/L (ref 98–110)
Creat: 0.79 mg/dL (ref 0.50–0.99)
GFR, Est African American: 91 mL/min/{1.73_m2} (ref >=60)
GFR, Est Non African American: 79 mL/min/{1.73_m2} (ref >=60)
Globulin: 2.1 g/dL (calc) (ref 1.9–3.7)
Glucose, Bld: 84 mg/dL (ref 65–99)
Potassium: 4.6 mmol/L (ref 3.5–5.3)
Sodium: 140 mmol/L (ref 135–146)
Total Bilirubin: 0.4 mg/dL (ref 0.2–1.2)
Total Protein: 6.5 g/dL (ref 6.1–8.1)

## 2020-09-21 LAB — CBC WITH DIFFERENTIAL/PLATELET
Absolute Monocytes: 409 cells/uL (ref 200–950)
Basophils Absolute: 52 cells/uL (ref 0–200)
Basophils Relative: 1.1 %
Eosinophils Absolute: 71 cells/uL (ref 15–500)
Eosinophils Relative: 1.5 %
HCT: 39.3 % (ref 35.0–45.0)
Hemoglobin: 13 g/dL (ref 11.7–15.5)
Lymphs Abs: 1184 cells/uL (ref 850–3900)
MCH: 30.5 pg (ref 27.0–33.0)
MCHC: 33.1 g/dL (ref 32.0–36.0)
MCV: 92.3 fL (ref 80.0–100.0)
MPV: 11.7 fL (ref 7.5–12.5)
Monocytes Relative: 8.7 %
Neutro Abs: 2985 cells/uL (ref 1500–7800)
Neutrophils Relative %: 63.5 %
Platelets: 309 10*3/uL (ref 140–400)
RBC: 4.26 10*6/uL (ref 3.80–5.10)
RDW: 12.1 % (ref 11.0–15.0)
Total Lymphocyte: 25.2 %
WBC: 4.7 10*3/uL (ref 3.8–10.8)

## 2020-09-21 LAB — B12 AND FOLATE PANEL
Folate: 19.6 ng/mL
Vitamin B-12: 458 pg/mL (ref 200–1100)

## 2020-09-21 LAB — IRON,TIBC AND FERRITIN PANEL
%SAT: 42 % (calc) (ref 16–45)
Ferritin: 33 ng/mL (ref 16–288)
Iron: 140 ug/dL (ref 45–160)
TIBC: 333 mcg/dL (calc) (ref 250–450)

## 2020-09-21 LAB — SEDIMENTATION RATE: Sed Rate: 9 mm/h (ref 0–30)

## 2020-09-21 LAB — C-REACTIVE PROTEIN: CRP: 0.5 mg/L (ref <8.0)

## 2020-09-21 LAB — TSH: TSH: 1.75 mIU/L (ref 0.40–4.50)

## 2020-09-21 NOTE — Progress Notes (Signed)
Kellie [name],   Inflammation rate is low.  Thyroid is perfect.  B12 great.  Kidney, liver, glucose look wonderful.  Hemoglobin great.  Iron wonderful.   Amazing labs. If all labs and imaging are normal. We have to consider fibromyalgia as source of your pain.

## 2020-09-23 ENCOUNTER — Encounter: Payer: Self-pay | Admitting: Physician Assistant

## 2020-09-26 ENCOUNTER — Other Ambulatory Visit: Payer: Self-pay | Admitting: Physician Assistant

## 2020-09-26 DIAGNOSIS — F419 Anxiety disorder, unspecified: Secondary | ICD-10-CM

## 2020-09-26 DIAGNOSIS — F418 Other specified anxiety disorders: Secondary | ICD-10-CM

## 2020-09-26 NOTE — Telephone Encounter (Signed)
Written 09/20/2020 #30 with 5 refills This is a refill request on an old RX, please sign denial.

## 2020-09-27 ENCOUNTER — Telehealth: Payer: Self-pay | Admitting: *Deleted

## 2020-09-27 DIAGNOSIS — Z823 Family history of stroke: Secondary | ICD-10-CM

## 2020-09-27 DIAGNOSIS — E7800 Pure hypercholesterolemia, unspecified: Secondary | ICD-10-CM

## 2020-09-27 NOTE — Telephone Encounter (Signed)
Vascular carotid ultrasound reordered.

## 2020-09-27 NOTE — Telephone Encounter (Signed)
I called [facility] where the scans were ordered and they are supposed to be calling her to get these scheduled. - CF

## 2020-09-27 NOTE — Telephone Encounter (Signed)
Patient also left vm about this on my vm. Wants appt ASAP for scans, can we let her know as soon as this is scheduled?

## 2020-09-28 ENCOUNTER — Other Ambulatory Visit: Payer: Self-pay | Admitting: Physician Assistant

## 2020-09-28 DIAGNOSIS — Z823 Family history of stroke: Secondary | ICD-10-CM

## 2020-09-28 DIAGNOSIS — R4182 Altered mental status, unspecified: Secondary | ICD-10-CM

## 2020-10-07 DIAGNOSIS — J1282 Pneumonia due to coronavirus disease 2019: Secondary | ICD-10-CM | POA: Diagnosis not present

## 2020-10-07 DIAGNOSIS — U071 COVID-19: Secondary | ICD-10-CM | POA: Diagnosis not present

## 2020-10-18 NOTE — Telephone Encounter (Signed)
Please advise on patient mychart message  This is Kellie [name] would u look at my test results from 8.2.21 want to know size of hiatal hernia cau se I'm having some issues now Thanks [phone]

## 2020-10-19 ENCOUNTER — Other Ambulatory Visit: Payer: Self-pay | Admitting: Physician Assistant

## 2020-10-19 DIAGNOSIS — I739 Peripheral vascular disease, unspecified: Secondary | ICD-10-CM

## 2020-10-19 DIAGNOSIS — I6523 Occlusion and stenosis of bilateral carotid arteries: Secondary | ICD-10-CM

## 2020-10-24 ENCOUNTER — Encounter: Payer: Self-pay | Admitting: Physician Assistant

## 2020-10-24 DIAGNOSIS — K2100 Gastro-esophageal reflux disease with esophagitis, without bleeding: Secondary | ICD-10-CM

## 2020-10-24 DIAGNOSIS — R1013 Epigastric pain: Secondary | ICD-10-CM

## 2020-10-24 DIAGNOSIS — Z8719 Personal history of other diseases of the digestive system: Secondary | ICD-10-CM

## 2020-10-27 ENCOUNTER — Other Ambulatory Visit: Payer: Self-pay | Admitting: Gastroenterology

## 2020-10-27 ENCOUNTER — Other Ambulatory Visit: Payer: Self-pay | Admitting: Physician Assistant

## 2020-10-27 DIAGNOSIS — R200 Anesthesia of skin: Secondary | ICD-10-CM

## 2020-10-27 DIAGNOSIS — R202 Paresthesia of skin: Secondary | ICD-10-CM

## 2020-10-27 DIAGNOSIS — I739 Peripheral vascular disease, unspecified: Secondary | ICD-10-CM

## 2020-11-01 ENCOUNTER — Ambulatory Visit (INDEPENDENT_AMBULATORY_CARE_PROVIDER_SITE_OTHER): Payer: Medicare HMO

## 2020-11-01 ENCOUNTER — Telehealth: Payer: Self-pay

## 2020-11-01 DIAGNOSIS — R202 Paresthesia of skin: Secondary | ICD-10-CM | POA: Diagnosis not present

## 2020-11-01 DIAGNOSIS — I6523 Occlusion and stenosis of bilateral carotid arteries: Secondary | ICD-10-CM

## 2020-11-01 DIAGNOSIS — I739 Peripheral vascular disease, unspecified: Secondary | ICD-10-CM

## 2020-11-01 DIAGNOSIS — R200 Anesthesia of skin: Secondary | ICD-10-CM | POA: Diagnosis not present

## 2020-11-01 NOTE — Progress Notes (Signed)
Normal flow and no significant stenosis of carotid arteries.

## 2020-11-01 NOTE — Telephone Encounter (Signed)
Pt called and asked about the barium swallow. Does she just need to call and schedule? Thanks.

## 2020-11-01 NOTE — Progress Notes (Signed)
No sign of peripheral vascular disease. Normal ABI.

## 2020-11-02 ENCOUNTER — Encounter: Payer: Self-pay | Admitting: Physician Assistant

## 2020-11-02 NOTE — Telephone Encounter (Signed)
Kellie [name]    these have to be entered in the system a specific way and I have been tying to locate my notes I have finally been able to get it in there correctly and sent they should be calling her within a day or two. - CF

## 2020-11-04 ENCOUNTER — Other Ambulatory Visit: Payer: Self-pay | Admitting: Physician Assistant

## 2020-11-04 DIAGNOSIS — R1310 Dysphagia, unspecified: Secondary | ICD-10-CM

## 2020-11-06 DIAGNOSIS — J1282 Pneumonia due to coronavirus disease 2019: Secondary | ICD-10-CM | POA: Diagnosis not present

## 2020-11-06 DIAGNOSIS — U071 COVID-19: Secondary | ICD-10-CM | POA: Diagnosis not present

## 2020-11-06 DIAGNOSIS — J189 Pneumonia, unspecified organism: Secondary | ICD-10-CM | POA: Diagnosis not present

## 2020-11-07 ENCOUNTER — Other Ambulatory Visit (HOSPITAL_COMMUNITY): Payer: Self-pay | Admitting: Specialist

## 2020-11-07 DIAGNOSIS — R1013 Epigastric pain: Secondary | ICD-10-CM

## 2020-11-07 DIAGNOSIS — K449 Diaphragmatic hernia without obstruction or gangrene: Secondary | ICD-10-CM

## 2020-11-14 ENCOUNTER — Encounter (HOSPITAL_COMMUNITY): Payer: Self-pay | Admitting: Speech Pathology

## 2020-11-14 ENCOUNTER — Other Ambulatory Visit: Payer: Self-pay

## 2020-11-14 ENCOUNTER — Ambulatory Visit (HOSPITAL_COMMUNITY)
Admission: RE | Admit: 2020-11-14 | Discharge: 2020-11-14 | Disposition: A | Discharge status: Home / Self Care | Payer: Medicare HMO | Source: Ambulatory Visit | Attending: Physician Assistant | Admitting: Physician Assistant

## 2020-11-14 ENCOUNTER — Ambulatory Visit (HOSPITAL_COMMUNITY): Payer: Medicare HMO | Attending: Physician Assistant | Admitting: Speech Pathology

## 2020-11-14 DIAGNOSIS — K449 Diaphragmatic hernia without obstruction or gangrene: Secondary | ICD-10-CM | POA: Diagnosis not present

## 2020-11-14 DIAGNOSIS — R1013 Epigastric pain: Secondary | ICD-10-CM | POA: Diagnosis not present

## 2020-11-14 DIAGNOSIS — K224 Dyskinesia of esophagus: Secondary | ICD-10-CM | POA: Diagnosis not present

## 2020-11-14 DIAGNOSIS — R1312 Dysphagia, oropharyngeal phase: Secondary | ICD-10-CM | POA: Insufficient documentation

## 2020-11-14 DIAGNOSIS — R1310 Dysphagia, unspecified: Secondary | ICD-10-CM | POA: Diagnosis not present

## 2020-11-14 NOTE — Therapy (Signed)
[facility] [place], [place], [address] Phone: [phone]   Fax:  [phone]  Modified Barium Swallow  Patient Details  Name: Kellie [name] MRN: [id number] Date of Birth: [birth year] No data recorded  Encounter Date: 11/14/2020   End of Session - 11/14/20 1411    Visit Number 1    Number of Visits 1    Authorization Type [employer]    SLP Start Time 2683    SLP Stop Time  1220    SLP Time Calculation (min) 35 min    Activity Tolerance Patient tolerated treatment well           Past Medical History:  Diagnosis Date  . Abnormal Pap smear of cervix   . Allergic rhinitis 11/08/2010  . Anxiety   . Asthma   . Bronchitis   . Herpes simplex of female genitalia   . Hyperlipidemia   . Mitral valve prolapse   . Osteoporosis 11/21/2011  . Urticaria     Past Surgical History:  Procedure Laterality Date  . CLOSED MANIPULATION SHOULDER  7&01/2005   x2, 30 days apart  . CLOSED MANIPULATION SHOULDER  2006   Left  . COLONOSCOPY  09/2012   Medium sized external hemorrhoids, few small divertic in left colon, otherwise normal colon (repeat 10 yrs)  . ESOPHAGOGASTRODUODENOSCOPY  09/2012   Normal (Dr. Ardis Hughs)  . LEEP  11/2004    There were no vitals filed for this visit.   Subjective Assessment - 11/14/20 1356    Subjective "Sometimes I have to hold water in my mouth for a while before I swallow."    Special Tests MBSS    Currently in Pain? No/denies               General - 11/14/20 1400      General Information   Date of Onset 10/23/20    HPI Kellie [name] is a 65 yo female who was referred by [name], PA-C for MBSS due to Pt reports of difficulty swallowing. She indicates that she has seen ENT (Dr. Joya Gaskins) in the past for hoarseness, has a hiatal hernia, reflux, epigastric pain, and globus sensation in the pharynx.    Type of Study MBS-Modified Barium Swallow Study    Diet Prior to this Study Regular;Thin  liquids    Temperature Spikes Noted No    Respiratory Status Room air    History of Recent Intubation No    Behavior/Cognition Alert;Cooperative;Pleasant mood    Oral Cavity Assessment Within Functional Limits    Oral Care Completed by SLP No    Oral Cavity - Dentition Adequate natural dentition    Vision Functional for self feeding    Self-Feeding Abilities Able to feed self    Patient Positioning Upright in chair    Baseline Vocal Quality Normal    Volitional Cough Strong    Volitional Swallow Able to elicit    Anatomy Within functional limits    Pharyngeal Secretions Not observed secondary MBS              Oral Preparation/Oral Phase - 11/14/20 1403      Oral Preparation/Oral Phase   Oral Phase Within functional limits            Pharyngeal Phase - 11/14/20 1403      Pharyngeal Phase   Pharyngeal Phase Within functional limits            Cricopharyngeal Phase - 11/14/20 1403  Cervical Esophageal Phase   Cervical Esophageal Phase Impaired      Cervical Esophageal Phase - Comment   Other Esophageal Phase Observations stasis of barium tablet in mid esophagus and again near LES, but eventually clears with puree wash, some retrograde movement of barium noted in distal esophagus             Plan - 11/14/20 1412    Clinical Impression Statement Oropharyngeal swallow is WNL, Pt with prompt swallow initiation, hyolaryngeal excursion, no penetration/aspiration, and no residuals with consistencies presented. Esophageal sweep revealed brief stasis of barium tablet in mid esophagus and again near LES. The pill did not clear with liquid wash, but did with bite of puree. Some retrograde bolus movement was noted in the esophagus but did not reach the level of the UES. Pt reports h/o sinus drainage, reflux, and hiatal hernia. When the pill became delayed in the esophagus, she felt referred symptoms in her pharynx. Suspect that Pt's subjective report of symptoms are  consistent with esophageal dysphagia (reflux, dysmotility). Pt was encouraged to elevate the head of her bed and continue with reflux precautions. She takes a PPI, but stopped it 4 days ago. Consider GI consult and/or barium swallow/esophagram if symptoms persist. Pt in agreement with plan of care.    Treatment/Interventions Patient/family education    Potential to Achieve Goals Good           Patient will benefit from skilled therapeutic intervention in order to improve the following deficits and impairments:   Dysphagia, oropharyngeal phase     Recommendations/Treatment - 11/14/20 1410      Swallow Evaluation Recommendations   Recommended Consults Consider esophageal assessment    SLP Diet Recommendations Age appropriate regular;Thin    Liquid Administration via Cup;Straw    Medication Administration Whole meds with liquid    Supervision Patient able to self feed    Postural Changes Seated upright at 90 degrees;Remain upright for at least 30 minutes after feeds/meals            Prognosis - 11/14/20 1410      Prognosis   Prognosis for Safe Diet Advancement Good      Individuals Consulted   Consulted and Agree with Results and Recommendations Patient    Report Sent to  Referring physician           Problem List Patient Active Problem List   Diagnosis Date Noted  . Nonrheumatic aortic valve insufficiency 09/20/2020  . Bilateral leg pain 09/20/2020  . Numbness and tingling of both feet 09/20/2020  . Family history of stroke 09/20/2020  . Ankle edema, bilateral 09/20/2020  . Anxiety about health 09/20/2020  . Breast pain, right 07/11/2020  . Leukopenia 06/21/2020  . Low serum calcium 06/21/2020  . Belching 03/23/2020  . Globin abnormality ([place]) 02/19/2020  . Hiatal hernia 02/19/2020  . Gastroesophageal reflux disease with esophagitis 02/19/2020  . History of iron deficiency 02/15/2020  . Hyperinflation of lungs 01/15/2020  . Vitamin D deficiency 12/02/2019  .  No energy 12/02/2019  . Rectal bleeding 12/02/2019  . Dysphagia 12/02/2019  . Tenderness of neck 12/02/2019  . Anterolisthesis 07/28/2019  . Reactive airway disease that is not asthma 04/17/2019  . Plantar fasciitis, right 04/02/2019  . Vitamin D insufficiency 02/16/2019  . Cervical stenosis (uterine cervix) 02/13/2019  . Chronic pain of both ankles 01/15/2019  . Cortical age-related cataract of both eyes 12/22/2018  . Pinguecula of both eyes 12/22/2018  . Nuclear sclerotic cataract of both eyes  12/22/2018  . History of diverticulitis 09/16/2018  . Vaginal dryness 09/02/2018  . Dermatochalasis of both upper eyelids 03/13/2018  . Periodic limb movement 03/13/2018  . Nocturnal leg cramps 03/13/2018  . Aortic insufficiency 01/10/2018  . Non-restorative sleep 01/10/2018  . Snoring 01/10/2018  . Atypical chest pain 10/22/2017  . Anxiety 10/22/2017  . History of loop electrical excision procedure (LEEP) 07/01/2017  . Unintentional weight loss 05/18/2017  . Elevated LDL cholesterol level 05/18/2017  . Hyperlipidemia 05/18/2017  . Irritable bowel syndrome with both constipation and diarrhea 05/18/2017  . Family history of pancreatic cancer 02/18/2017  . Diarrhea 02/18/2017  . Loss of weight 02/18/2017  . Weakness 10/05/2016  . Panic attacks 09/18/2016  . SOB (shortness of breath) 09/18/2016  . Bipolar 1 disorder with moderate mania ([place]) 09/17/2016  . Pulmonary nodule 08/08/2016  . Hoarseness 03/09/2016  . Left knee pain 11/09/2015  . Chronic pain of both knees 11/09/2015  . Plantar fasciitis, bilateral 11/09/2015  . Cough, persistent 07/21/2015  . Post-nasal drip 07/21/2015  . DDD (degenerative disc disease), cervical 07/05/2015  . Chronic nasal congestion 03/01/2015  . Vitreous floaters of right eye 03/01/2015  . Near syncope 02/09/2015  . Rhinitis, allergic 10/14/2014  . Family history of renal cancer 10/11/2014  . DDD (degenerative disc disease), lumbar 10/11/2014  .  Bipolar disorder with depression ([place]) 06/02/2014  . Postmenopausal atrophic vaginitis 03/17/2014  . Prediabetes 12/14/2013  . Encounter for gynecological examination with Papanicolaou smear of cervix 12/08/2013  . Adhesive capsulitis of right shoulder 11/24/2013  . Chronic periodontal disease 06/30/2013  . Genital herpes 11/05/2012  . Hemorrhoids 11/05/2012  . Borderline intellectual functioning 02/15/2012  . Osteoporosis 11/21/2011  . Cough 06/02/2011  . Allergic rhinitis 11/08/2010  . Insomnia 11/08/2010  . GERD (gastroesophageal reflux disease) 09/27/2010   Thank you,  Genene Churn, [place]  Genene Churn 11/14/2020, 2:45 PM  [facility] [address] [place], [place], [address] Phone: [phone]   Fax:  [phone]  Name: KAMEISHA MALICKI MRN: [id number] Date of Birth: [birth year]

## 2020-11-15 NOTE — Progress Notes (Signed)
No swallowing dysfunction identified.

## 2020-11-28 ENCOUNTER — Other Ambulatory Visit: Payer: Self-pay | Admitting: Physician Assistant

## 2020-11-28 ENCOUNTER — Other Ambulatory Visit: Payer: Self-pay | Admitting: Gastroenterology

## 2020-11-28 DIAGNOSIS — A6004 Herpesviral vulvovaginitis: Secondary | ICD-10-CM

## 2020-11-30 ENCOUNTER — Encounter: Payer: Self-pay | Admitting: Physician Assistant

## 2020-12-07 DIAGNOSIS — U071 COVID-19: Secondary | ICD-10-CM | POA: Diagnosis not present

## 2020-12-07 DIAGNOSIS — J189 Pneumonia, unspecified organism: Secondary | ICD-10-CM | POA: Diagnosis not present

## 2020-12-07 DIAGNOSIS — J1282 Pneumonia due to coronavirus disease 2019: Secondary | ICD-10-CM | POA: Diagnosis not present

## 2020-12-27 ENCOUNTER — Other Ambulatory Visit (HOSPITAL_COMMUNITY): Payer: Self-pay | Admitting: Psychiatry

## 2020-12-27 DIAGNOSIS — H524 Presbyopia: Secondary | ICD-10-CM | POA: Diagnosis not present

## 2020-12-27 DIAGNOSIS — H52223 Regular astigmatism, bilateral: Secondary | ICD-10-CM | POA: Diagnosis not present

## 2021-01-03 ENCOUNTER — Encounter (HOSPITAL_COMMUNITY): Payer: Self-pay | Admitting: Psychiatry

## 2021-01-03 ENCOUNTER — Telehealth (INDEPENDENT_AMBULATORY_CARE_PROVIDER_SITE_OTHER): Payer: Medicare HMO | Admitting: Psychiatry

## 2021-01-03 DIAGNOSIS — F319 Bipolar disorder, unspecified: Secondary | ICD-10-CM

## 2021-01-03 DIAGNOSIS — F419 Anxiety disorder, unspecified: Secondary | ICD-10-CM | POA: Diagnosis not present

## 2021-01-03 MED ORDER — CITALOPRAM HYDROBROMIDE 10 MG PO TABS: ORAL_TABLET | ORAL | 0 refills | Status: DC
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
## 2021-01-06 DIAGNOSIS — J1282 Pneumonia due to coronavirus disease 2019: Secondary | ICD-10-CM | POA: Diagnosis not present

## 2021-01-06 DIAGNOSIS — U071 COVID-19: Secondary | ICD-10-CM | POA: Diagnosis not present

## 2021-01-06 DIAGNOSIS — J189 Pneumonia, unspecified organism: Secondary | ICD-10-CM | POA: Diagnosis not present

## 2021-01-12 ENCOUNTER — Ambulatory Visit: Payer: Medicare HMO | Admitting: Cardiology

## 2021-01-18 DIAGNOSIS — D485 Neoplasm of uncertain behavior of skin: Secondary | ICD-10-CM | POA: Diagnosis not present

## 2021-01-18 DIAGNOSIS — X32XXXD Exposure to sunlight, subsequent encounter: Secondary | ICD-10-CM | POA: Diagnosis not present

## 2021-01-18 DIAGNOSIS — L570 Actinic keratosis: Secondary | ICD-10-CM | POA: Diagnosis not present

## 2021-01-18 DIAGNOSIS — Z1283 Encounter for screening for malignant neoplasm of skin: Secondary | ICD-10-CM | POA: Diagnosis not present

## 2021-01-18 DIAGNOSIS — D225 Melanocytic nevi of trunk: Secondary | ICD-10-CM | POA: Diagnosis not present

## 2021-01-18 DIAGNOSIS — L218 Other seborrheic dermatitis: Secondary | ICD-10-CM | POA: Diagnosis not present

## 2021-01-30 ENCOUNTER — Encounter: Payer: Self-pay | Admitting: Physician Assistant

## 2021-01-30 ENCOUNTER — Telehealth: Payer: Self-pay | Admitting: Neurology

## 2021-01-30 NOTE — Telephone Encounter (Signed)
Patient left vm stating she tested positive for Covid, asking for Seraphim Trow to send in prescriptions.   Please call patient and offer her virtual appt with any provider to discuss symptoms/medication. Can not send medications without an appointment.

## 2021-01-31 ENCOUNTER — Telehealth (INDEPENDENT_AMBULATORY_CARE_PROVIDER_SITE_OTHER): Payer: Medicare HMO | Admitting: Osteopathic Medicine

## 2021-01-31 ENCOUNTER — Encounter: Payer: Self-pay | Admitting: Osteopathic Medicine

## 2021-01-31 VITALS — Wt 108.0 lb

## 2021-01-31 DIAGNOSIS — R059 Cough, unspecified: Secondary | ICD-10-CM | POA: Diagnosis not present

## 2021-01-31 DIAGNOSIS — U071 COVID-19: Secondary | ICD-10-CM

## 2021-01-31 MED ORDER — GUAIFENESIN 100 MG/5ML PO SYRP: 200.0000 mg | ORAL_SOLUTION | Freq: Four times a day (QID) | ORAL | 0 refills | Status: DC | PRN

## 2021-01-31 MED ORDER — PREDNISONE 20 MG PO TABS: 20.0000 mg | ORAL_TABLET | Freq: Every day | ORAL | 0 refills | Status: DC

## 2021-01-31 MED ORDER — AMBULATORY NON FORMULARY MEDICATION: 99 refills | Status: AC

## 2021-01-31 MED ORDER — ALBUTEROL SULFATE HFA 108 (90 BASE) MCG/ACT IN AERS: 2.0000 | INHALATION_SPRAY | RESPIRATORY_TRACT | 0 refills | Status: DC | PRN

## 2021-01-31 NOTE — Telephone Encounter (Signed)
Patient already had an appointment and was taken care of.

## 2021-01-31 NOTE — Telephone Encounter (Signed)
Patient already had appointment and medication was sent in. No further questions at this time.

## 2021-01-31 NOTE — Progress Notes (Signed)
Telemedicine Visit via  Audio only - telephone (patient preference /  technical difficulty with MyChart video application)  I connected with Kellie [name] on 01/31/21 at 8:23 AM  by phone or  telemedicine application as noted above  I verified that I am speaking with or regarding  the correct patient using two identifiers.  Participants: Myself, Dr Emeterio Reeve DO Patient: Kellie [name] Patient proxy if applicable: none Other, if applicable: none  Patient is at home I am in office at [facility]    I discussed the limitations of evaluation and management  by telemedicine and the availability of in person appointments.  The participant(s) above expressed understanding and  agreed to proceed with this appointment via telemedicine.       History of Present Illness: Kellie [name] is a 65 y.o. female who would like to discuss COVID(+)   Onset last week about 6-7 das ago PCR test positive - collected 01/28/2021 Home treatments tried: Tussin CF, hot water and honey, turmeric/ginger tea, vitamins Current symptoms: sneezing, coughing, no fever Concerned about avoiding pneumonia Not COVID vaccinated Not PPSV23 or PCV13/20 vaccinated    Immunization History  Administered Date(s) Administered   Tdap 06/25/2005       Observations/Objective: Wt 108 lb (49 kg)   BMI 17.43 kg/m  BP Readings from Last 3 Encounters:  09/20/20 101/62  06/21/20 120/72  05/27/20 110/62   Exam: Normal Speech.  NAD  Lab and Radiology Results No results found for this or any previous visit (from the past 72 hour(s)). No results found.     Assessment and Plan: 65 y.o. female with The primary encounter diagnosis was COVID-19. A diagnosis of Cough was also pertinent to this visit.   PDMP not reviewed this encounter. No orders of the defined types were placed in this encounter.  Meds ordered this encounter  Medications   albuterol (VENTOLIN HFA) 108 (90 Base)  MCG/ACT inhaler    Sig: Inhale 2 puffs into the lungs every 4 (four) hours as needed for wheezing or shortness of breath.    Dispense:  8.5 g    Refill:  0   guaifenesin (ROBITUSSIN) 100 MG/5ML syrup    Sig: Take 10 mLs (200 mg total) by mouth 4 (four) times daily as needed for cough.    Dispense:  473 mL    Refill:  0   AMBULATORY NON FORMULARY MEDICATION    Sig: Incentive spirometer use as directed - fax to [facility] [phone]    Dispense:  1 Units    Refill:  99   predniSONE (DELTASONE) 20 MG tablet    Sig: Take 1 tablet (20 mg total) by mouth daily with breakfast.    Dispense:  10 tablet    Refill:  0   There are no Patient Instructions on file for this visit.  Instructions sent via MyChart.   Follow Up Instructions: Return if symptoms worsen or fail to improve - to [facility] or 911 for severe symptoms.    I discussed the assessment and treatment plan with the patient. The patient was provided an opportunity to ask questions and all were answered. The patient agreed with the plan and demonstrated an understanding of the instructions.   The patient was advised to call back or seek an in-person evaluation if any new concerns, if symptoms worsen or if the condition fails to improve as anticipated.  21 minutes of non-face-to-face time was provided during this encounter.      . . . . . . . . . . . . . [name] Kitchen  Historical information moved to improve visibility of documentation.  Past Medical History:  Diagnosis Date   Abnormal Pap smear of cervix    Allergic rhinitis 11/08/2010   Anxiety    Asthma    Bronchitis    Herpes simplex of female genitalia    Hyperlipidemia    Mitral valve prolapse    Osteoporosis 11/21/2011   Urticaria    Past Surgical History:  Procedure Laterality Date   CLOSED MANIPULATION SHOULDER  7&01/2005   x2, 30 days apart   CLOSED MANIPULATION SHOULDER  2006   Left   COLONOSCOPY  09/2012    Medium sized external hemorrhoids, few small divertic in left colon, otherwise normal colon (repeat 10 yrs)   ESOPHAGOGASTRODUODENOSCOPY  09/2012   Normal (Dr. Ardis Hughs)   LEEP  11/2004   Social History   Tobacco Use   Smoking status: Never   Smokeless tobacco: Never  Substance Use Topics   Alcohol use: No   family history includes Allergic rhinitis in her father and mother; Asthma in her mother; COPD in her mother and sister; Depression in her maternal aunt, maternal grandfather, and sister; Diabetes in an other family member; Heart attack in her brother; Heart disease in her brother, father, and paternal uncle; Hypothyroidism in her sister and sister; Kidney cancer (age of onset: 43) in her mother; Kidney disease in her mother; Pancreatic cancer in her sister; Thyroid disease in an other family member.  Medications: Current Outpatient Medications  Medication Sig Dispense Refill   acyclovir (ZOVIRAX) 200 MG capsule TAKE 2 CAPSULES BY MOUTH TWICE DAILY. 120 capsule 0   alendronate (FOSAMAX) 70 MG tablet TAKE 1 TABLET BY MOUTH ONCE A WEEK WITH 8 OUNCE GLASS OF WATER 30 MIN BEFORE BREAKFAST AND OTHER MEDS. DON'T LIE DOWN FOR 30 MINUTES. 12 tablet 3   ALPRAZolam (XANAX) 0.5 MG tablet TAKE 1 TABLET ONCE DAILY FOR ANXIETY. 30 tablet 5   AMBULATORY NON FORMULARY MEDICATION Incentive spirometer use as directed - fax to [facility] [phone] 1 Units 99   atorvastatin (LIPITOR) 10 MG tablet Take 1 tablet (10 mg total) by mouth daily. 90 tablet 3   BIOTIN PO Take 1 tablet by mouth daily.     budesonide-formoterol (SYMBICORT) 160-4.5 MCG/ACT inhaler Inhale 2 puffs into the lungs 2 (two) times daily. 1 Inhaler 3   Cholecalciferol (VITAMIN D) 2000 units CAPS Take by mouth.     citalopram (CELEXA) 10 MG tablet TAKE (1) TABLET BY MOUTH ONCE DAILY. 30 tablet 0   cyclobenzaprine (FLEXERIL) 10 MG tablet One half to one tab PO qHS 30 tablet 0   gabapentin (NEURONTIN) 100 MG capsule Take 1  to 3 tablets up to three times day. 270 capsule 1   guaifenesin (ROBITUSSIN) 100 MG/5ML syrup Take 10 mLs (200 mg total) by mouth 4 (four) times daily as needed for cough. 473 mL 0   hydrocortisone (ANUSOL-HC) 25 MG suppository Place 1 suppository (25 mg total) rectally 2 (two) times daily as needed for hemorrhoids. 24 suppository 1   meloxicam (MOBIC) 15 MG tablet TAKE 1 TABLET IN THE MORNING WITH BREAKFAST FOR 2 WEEKS, THEN DAILY AS NEEDED FOR PAIN. 30 tablet 5   Multiple Vitamins-Minerals (ZINC PO) Take 1 tablet by mouth daily.     pantoprazole (PROTONIX) 40 MG tablet TAKE (1) TABLET BY MOUTH TWICE DAILY. 60 tablet 0   predniSONE (DELTASONE) 20 MG tablet Take 1 tablet (20 mg total) by mouth daily with breakfast. 10 tablet 0  Probiotic Product (PROBIOTIC & ACIDOPHILUS EX ST PO) Take by mouth.      Spacer/Aero-Holding Chambers (AEROCHAMBER MV) inhaler Use as instructed 1 each 0   albuterol (VENTOLIN HFA) 108 (90 Base) MCG/ACT inhaler Inhale 2 puffs into the lungs every 4 (four) hours as needed for wheezing or shortness of breath. 8.5 g 0   No current facility-administered medications for this visit.   Allergies  Allergen Reactions   Codeine Nausea And Vomiting   Hydrocodone Nausea And Vomiting   Morphine Nausea And Vomiting   Abilify [Aripiprazole]     Nausea/vomiting/weakness/shaky   Morphine And Related Nausea And Vomiting   Percocet [Oxycodone-Acetaminophen] Nausea And Vomiting and Other (See Comments)    Patient states it makes blood pressure drop too much   Vicodin [Hydrocodone-Acetaminophen] Nausea And Vomiting     If phone visit, billing and coding can please add appropriate modifier if needed

## 2021-02-06 DIAGNOSIS — J189 Pneumonia, unspecified organism: Secondary | ICD-10-CM | POA: Diagnosis not present

## 2021-02-06 DIAGNOSIS — J1282 Pneumonia due to coronavirus disease 2019: Secondary | ICD-10-CM | POA: Diagnosis not present

## 2021-02-06 DIAGNOSIS — U071 COVID-19: Secondary | ICD-10-CM | POA: Diagnosis not present

## 2021-02-15 ENCOUNTER — Ambulatory Visit (INDEPENDENT_AMBULATORY_CARE_PROVIDER_SITE_OTHER): Payer: Medicare HMO | Admitting: Physician Assistant

## 2021-02-15 ENCOUNTER — Encounter: Payer: Self-pay | Admitting: Physician Assistant

## 2021-02-15 ENCOUNTER — Other Ambulatory Visit: Payer: Self-pay

## 2021-02-15 VITALS — BP 110/70 | HR 74 | Ht 66.0 in | Wt 111.1 lb

## 2021-02-15 DIAGNOSIS — Z1329 Encounter for screening for other suspected endocrine disorder: Secondary | ICD-10-CM | POA: Diagnosis not present

## 2021-02-15 DIAGNOSIS — Z0000 Encounter for general adult medical examination without abnormal findings: Secondary | ICD-10-CM

## 2021-02-15 DIAGNOSIS — Z131 Encounter for screening for diabetes mellitus: Secondary | ICD-10-CM | POA: Diagnosis not present

## 2021-02-15 DIAGNOSIS — F3112 Bipolar disorder, current episode manic without psychotic features, moderate: Secondary | ICD-10-CM

## 2021-02-15 DIAGNOSIS — Z13220 Encounter for screening for lipoid disorders: Secondary | ICD-10-CM

## 2021-02-15 DIAGNOSIS — R058 Other specified cough: Secondary | ICD-10-CM

## 2021-02-15 DIAGNOSIS — D508 Other iron deficiency anemias: Secondary | ICD-10-CM

## 2021-02-15 DIAGNOSIS — J989 Respiratory disorder, unspecified: Secondary | ICD-10-CM | POA: Diagnosis not present

## 2021-02-15 DIAGNOSIS — I351 Nonrheumatic aortic (valve) insufficiency: Secondary | ICD-10-CM

## 2021-02-15 DIAGNOSIS — M810 Age-related osteoporosis without current pathological fracture: Secondary | ICD-10-CM | POA: Diagnosis not present

## 2021-02-15 DIAGNOSIS — R1310 Dysphagia, unspecified: Secondary | ICD-10-CM

## 2021-02-15 DIAGNOSIS — D259 Leiomyoma of uterus, unspecified: Secondary | ICD-10-CM

## 2021-02-15 MED ORDER — BENZONATATE 200 MG PO CAPS
200.0000 mg | ORAL_CAPSULE | Freq: Two times a day (BID) | ORAL | 0 refills | Status: DC | PRN
Start: 2021-02-15 — End: 2021-05-02

## 2021-02-15 MED ORDER — FAMOTIDINE 40 MG PO TABS: 40.0000 mg | ORAL_TABLET | Freq: Every day | ORAL | 1 refills | Status: DC

## 2021-02-15 NOTE — Patient Instructions (Addendum)
Symbicort 1 puff twice a day Start pepcid at bedtime Place referral for GI.  Health Maintenance After Age 66 After age 71, you are at a higher risk for certain long-term diseases and infections as well as injuries from falls. Falls are a major cause of broken bones and head injuries in people who are older than age 60. Getting regular preventive care can help to keep you healthy and well. Preventive care includes getting regular testing and making lifestyle changes as recommended by your health care provider. Talk with your health care provider about: Which screenings and tests you should have. A screening is a test that checks for a disease when you have no symptoms. A diet and exercise plan that is right for you. What should I know about screenings and tests to prevent falls? Screening and testing are the best ways to find a health problem early. Early diagnosis and treatment give you the best chance of managing medical conditions that are common after age 51. Certain conditions and lifestyle choices may make you more likely to have a fall. Your health care provider may recommend: Regular vision checks. Poor vision and conditions such as cataracts can make you more likely to have a fall. If you wear glasses, make sure to get your prescription updated if your vision changes. Medicine review. Work with your health care provider to regularly review all of the medicines you are taking, including over-the-counter medicines. Ask your health care provider about any side effects that may make you more likely to have a fall. Tell your health care provider if any medicines that you take make you feel dizzy or sleepy. Osteoporosis screening. Osteoporosis is a condition that causes the bones to get weaker. This can make the bones weak and cause them to break more easily. Blood pressure screening. Blood pressure changes and medicines to control blood pressure can make you feel dizzy. Strength and balance checks.  Your health care provider may recommend certain tests to check your strength and balance while standing, walking, or changing positions. Foot health exam. Foot pain and numbness, as well as not wearing proper footwear, can make you more likely to have a fall. Depression screening. You may be more likely to have a fall if you have a fear of falling, feel emotionally low, or feel unable to do activities that you used to do. Alcohol use screening. Using too much alcohol can affect your balance and may make you more likely to have a fall. What actions can I take to lower my risk of falls? General instructions Talk with your health care provider about your risks for falling. Tell your health care provider if: You fall. Be sure to tell your health care provider about all falls, even ones that seem minor. You feel dizzy, sleepy, or off-balance. Take over-the-counter and prescription medicines only as told by your health care provider. These include any supplements. Eat a healthy diet and maintain a healthy weight. A healthy diet includes low-fat dairy products, low-fat (lean) meats, and fiber from whole grains, beans, and lots of fruits and vegetables. Home safety Remove any tripping hazards, such as rugs, cords, and clutter. Install safety equipment such as grab bars in bathrooms and safety rails on stairs. Keep rooms and walkways well-lit. Activity  Follow a regular exercise program to stay fit. This will help you maintain your balance. Ask your health care provider what types of exercise are appropriate for you. If you need a cane or walker, use it as recommended  by your health care provider. Wear supportive shoes that have nonskid soles.  Lifestyle Do not drink alcohol if your health care provider tells you not to drink. If you drink alcohol, limit how much you have: 0-1 drink a day for women. 0-2 drinks a day for men. Be aware of how much alcohol is in your drink. In the [place] one drink  equals one typical bottle of beer (12 oz), one-half glass of wine (5 oz), or one shot of hard liquor (1 oz). Do not use any products that contain nicotine or tobacco, such as cigarettes and e-cigarettes. If you need help quitting, ask your health care provider. Summary Having a healthy lifestyle and getting preventive care can help to protect your health and wellness after age 61. Screening and testing are the best way to find a health problem early and help you avoid having a fall. Early diagnosis and treatment give you the best chance for managing medical conditions that are more common for people who are older than age 66. Falls are a major cause of broken bones and head injuries in people who are older than age 64. Take precautions to prevent a fall at home. Work with your health care provider to learn what changes you can make to improve your health and wellness and to prevent falls. This information is not intended to replace advice given to you by your health care provider. Make sure you discuss any questions you have with your healthcare provider. Document Revised: 05/27/2020 Document Reviewed: 05/27/2020 Elsevier Patient Education  2022 [employer].

## 2021-02-15 NOTE — Progress Notes (Signed)
.  h 

## 2021-02-15 NOTE — Progress Notes (Signed)
Subjective:    Kellie [name] is a 65 y.o. female who presents for a Welcome to Medicare exam.   Review of Systems  Pt request u/s of uterus due to fibroids. Not having intercourse/no abnormal bleeding.  She continues to have problems with dry cough and feeling like something is in her throat. She sees Dr. Ardis Hughs but has not had EGD.     Objective:    Today's Vitals   02/15/21 0940  BP: 110/70  Pulse: 74  SpO2: 100%  Weight: 111 lb 1.3 oz (50.4 kg)  Height: '5\' 6"'$  (1.676 m)  Body mass index is 17.93 kg/m.  Medications Outpatient Encounter Medications as of 02/15/2021  Medication Sig   acyclovir (ZOVIRAX) 200 MG capsule TAKE 2 CAPSULES BY MOUTH TWICE DAILY.   albuterol (VENTOLIN HFA) 108 (90 Base) MCG/ACT inhaler Inhale 2 puffs into the lungs every 4 (four) hours as needed for wheezing or shortness of breath.   alendronate (FOSAMAX) 70 MG tablet TAKE 1 TABLET BY MOUTH ONCE A WEEK WITH 8 OUNCE GLASS OF WATER 30 MIN BEFORE BREAKFAST AND OTHER MEDS. DON'T LIE DOWN FOR 30 MINUTES.   ALPRAZolam (XANAX) 0.5 MG tablet TAKE 1 TABLET ONCE DAILY FOR ANXIETY.   AMBULATORY NON FORMULARY MEDICATION Incentive spirometer use as directed - fax to [facility] [phone]   atorvastatin (LIPITOR) 10 MG tablet Take 1 tablet (10 mg total) by mouth daily.   benzonatate (TESSALON) 200 MG capsule Take 1 capsule (200 mg total) by mouth 2 (two) times daily as needed for cough.   BIOTIN PO Take 1 tablet by mouth daily.   budesonide-formoterol (SYMBICORT) 160-4.5 MCG/ACT inhaler Inhale 2 puffs into the lungs 2 (two) times daily.   Cholecalciferol (VITAMIN D) 2000 units CAPS Take by mouth.   citalopram (CELEXA) 10 MG tablet TAKE (1) TABLET BY MOUTH ONCE DAILY.   cyclobenzaprine (FLEXERIL) 10 MG tablet One half to one tab PO qHS   famotidine (PEPCID) 40 MG tablet Take 1 tablet (40 mg total) by mouth at bedtime.   gabapentin (NEURONTIN) 100 MG capsule Take 1 to 3 tablets up to three times  day.   hydrocortisone (ANUSOL-HC) 25 MG suppository Place 1 suppository (25 mg total) rectally 2 (two) times daily as needed for hemorrhoids.   meloxicam (MOBIC) 15 MG tablet TAKE 1 TABLET IN THE MORNING WITH BREAKFAST FOR 2 WEEKS, THEN DAILY AS NEEDED FOR PAIN.   Multiple Vitamins-Minerals (ZINC PO) Take 1 tablet by mouth daily.   pantoprazole (PROTONIX) 40 MG tablet TAKE (1) TABLET BY MOUTH TWICE DAILY.   Probiotic Product (PROBIOTIC & ACIDOPHILUS EX ST PO) Take by mouth.    Spacer/Aero-Holding Chambers (AEROCHAMBER MV) inhaler Use as instructed   [DISCONTINUED] guaifenesin (ROBITUSSIN) 100 MG/5ML syrup Take 10 mLs (200 mg total) by mouth 4 (four) times daily as needed for cough.   [DISCONTINUED] predniSONE (DELTASONE) 20 MG tablet Take 1 tablet (20 mg total) by mouth daily with breakfast.   No facility-administered encounter medications on file as of 02/15/2021.     History: Past Medical History:  Diagnosis Date   Abnormal Pap smear of cervix    Allergic rhinitis 11/08/2010   Anxiety    Asthma    Bronchitis    Herpes simplex of female genitalia    Hyperlipidemia    Mitral valve prolapse    Osteoporosis 11/21/2011   Urticaria    Past Surgical History:  Procedure Laterality Date   CLOSED MANIPULATION SHOULDER  7&01/2005   x2,  30 days apart   CLOSED MANIPULATION SHOULDER  2006   Left   COLONOSCOPY  09/2012   Medium sized external hemorrhoids, few small divertic in left colon, otherwise normal colon (repeat 10 yrs)   ESOPHAGOGASTRODUODENOSCOPY  09/2012   Normal (Dr. Ardis Hughs)   LEEP  11/2004    Family History  Problem Relation Age of Onset   Kidney cancer Mother 14   COPD Mother    Kidney disease Mother    Allergic rhinitis Mother    Asthma Mother    Heart disease Father    Allergic rhinitis Father    COPD Sister    Depression Sister    Pancreatic cancer Sister    Heart disease Brother    Heart attack Brother    Heart disease Paternal Uncle    Diabetes Other        neice    Thyroid disease Other        neice   Depression Maternal Aunt    Depression Maternal Grandfather    Hypothyroidism Sister    Hypothyroidism Sister    Colon cancer Neg Hx    Rectal cancer Neg Hx    Stomach cancer Neg Hx    Colon polyps Neg Hx    Social History   Occupational History    Comment: Retired  Tobacco Use   Smoking status: Never   Smokeless tobacco: Never  Vaping Use   Vaping Use: Never used  Substance and Sexual Activity   Alcohol use: No   Drug use: No   Sexual activity: Not Currently    Birth control/protection: Post-menopausal    Tobacco Counseling Counseling given: Not Answered   Immunizations and Health Maintenance Immunization History  Administered Date(s) Administered   Tdap 06/25/2005   Health Maintenance Due  Topic Date Due   DEXA SCAN  02/24/2021    Activities of Daily Living In your present state of health, do you have any difficulty performing the following activities: 02/15/2021  Hearing? N  Vision? N  Difficulty concentrating or making decisions? Y  Walking or climbing stairs? N  Dressing or bathing? N  Doing errands, shopping? N  Some recent data might be hidden    Physical Exam  (optional), or other factors deemed appropriate based on the beneficiary's medical and social history and current clinical standards.  Advanced Directives: information given      Assessment:    This is a routine wellness examination for this patient .   Dietary issues and exercise activities discussed:      Goals   None    Depression Screen PHQ 2/9 Scores 02/15/2021 09/20/2020 02/15/2020 02/13/2019  PHQ - 2 Score 0 '2 4 3  '$ PHQ- 9 Score '4 9 20 9  '$ Some encounter information is confidential and restricted. Go to Review Flowsheets activity to see all data.     Fall Risk Fall Risk  02/15/2021  Falls in the past year? 0  Number falls in past yr: 0  Injury with Fall? 0  Risk for fall due to : -  Follow up -    Cognitive Function:     6CIT  Screen 02/15/2021  What Year? 0 points  What month? 0 points  What time? 0 points  Count back from 20 0 points  Months in reverse 0 points  Repeat phrase 4 points  Total Score 4    Patient Care Team: Lavada Mesi as PCP - General (Family Medicine) Darius Bump, [facility] as Pharmacist (Pharmacist)  Plan:   ..Diagnoses and all orders for this visit:  Medicare annual wellness visit, initial  Nonrheumatic aortic valve insufficiency  Bipolar 1 disorder with moderate mania (HCC)  Dry cough -     benzonatate (TESSALON) 200 MG capsule; Take 1 capsule (200 mg total) by mouth 2 (two) times daily as needed for cough. -     famotidine (PEPCID) 40 MG tablet; Take 1 tablet (40 mg total) by mouth at bedtime.  Reactive airway disease without asthma  Screening for diabetes mellitus -     COMPLETE METABOLIC PANEL WITH GFR  Screening for lipid disorders -     Lipid Panel w/reflex Direct LDL  Age related osteoporosis, unspecified pathological fracture presence -     VITAMIN D 25 Hydroxy (Vit-D Deficiency, Fractures) -     DG Bone Density; Future  Thyroid disorder screen -     TSH  Iron deficiency anemia secondary to inadequate dietary iron intake -     CBC w/Diff/Platelet -     Fe+TIBC+Fer  Uterine leiomyoma, unspecified location -     US Pelvic Complete With Transvaginal; Future  Dry cough- seen pulmonology. Not taking symbicort. Take symbicort and start pepcid. Follow up with GI. For dysphagia and cough.   I have personally reviewed and noted the following in the patient's chart:   Medical and social history Use of alcohol, tobacco or illicit drugs  Current medications and supplements Functional ability and status Nutritional status Physical activity Advanced directives List of other physicians Hospitalizations, surgeries, and ER visits in previous 12 months Vitals Screenings to include cognitive, depression, and falls Referrals and appointments  In  addition, I have reviewed and discussed with patient certain preventive protocols, quality metrics, and best practice recommendations. A written personalized care plan for preventive services as well as general preventive health recommendations were provided to patient.

## 2021-02-16 ENCOUNTER — Telehealth: Payer: Self-pay | Admitting: Lab

## 2021-02-16 DIAGNOSIS — M810 Age-related osteoporosis without current pathological fracture: Secondary | ICD-10-CM | POA: Diagnosis not present

## 2021-02-16 DIAGNOSIS — Z131 Encounter for screening for diabetes mellitus: Secondary | ICD-10-CM | POA: Diagnosis not present

## 2021-02-16 DIAGNOSIS — E785 Hyperlipidemia, unspecified: Secondary | ICD-10-CM | POA: Diagnosis not present

## 2021-02-16 DIAGNOSIS — D508 Other iron deficiency anemias: Secondary | ICD-10-CM | POA: Diagnosis not present

## 2021-02-16 DIAGNOSIS — Z1329 Encounter for screening for other suspected endocrine disorder: Secondary | ICD-10-CM | POA: Diagnosis not present

## 2021-02-16 DIAGNOSIS — Z13220 Encounter for screening for lipoid disorders: Secondary | ICD-10-CM | POA: Diagnosis not present

## 2021-02-16 NOTE — Chronic Care Management (AMB) (Signed)
  Chronic Care Management   Note  02/16/2021 Name: DAWT CIRELLO MRN: [id number] DOB: 1956-04-18  JASEY SHELTON is a 65 y.o. year old female who is a primary care patient of Donella Stade, [place]. I reached out to [name] by phone today in response to a referral sent by Ms. Massie Kluver Blankenbeckler's PCP, Alden Hipp, Jade L, PA-C.   Ms. Zech was given information about Chronic Care Management services today including:  CCM service includes personalized support from designated clinical staff supervised by her physician, including individualized plan of care and coordination with other care providers 24/7 contact phone numbers for assistance for urgent and routine care needs. Service will only be billed when office clinical staff spend 20 minutes or more in a month to coordinate care. Only one practitioner may furnish and bill the service in a calendar month. The patient may stop CCM services at any time (effective at the end of the month) by phone call to the office staff.   Patient agreed to services and verbal consent obtained.   Follow up plan:   [place]

## 2021-02-17 ENCOUNTER — Encounter: Payer: Self-pay | Admitting: Physician Assistant

## 2021-02-17 LAB — CBC WITH DIFFERENTIAL/PLATELET
Absolute Monocytes: 351 cells/uL (ref 200–950)
Basophils Absolute: 39 cells/uL (ref 0–200)
Basophils Relative: 1 %
Eosinophils Absolute: 62 cells/uL (ref 15–500)
Eosinophils Relative: 1.6 %
HCT: 38.9 % (ref 35.0–45.0)
Hemoglobin: 12.5 g/dL (ref 11.7–15.5)
Lymphs Abs: 1139 cells/uL (ref 850–3900)
MCH: 30.6 pg (ref 27.0–33.0)
MCHC: 32.1 g/dL (ref 32.0–36.0)
MCV: 95.3 fL (ref 80.0–100.0)
MPV: 11.2 fL (ref 7.5–12.5)
Monocytes Relative: 9 %
Neutro Abs: 2309 cells/uL (ref 1500–7800)
Neutrophils Relative %: 59.2 %
Platelets: 320 10*3/uL (ref 140–400)
RBC: 4.08 10*6/uL (ref 3.80–5.10)
RDW: 12 % (ref 11.0–15.0)
Total Lymphocyte: 29.2 %
WBC: 3.9 10*3/uL (ref 3.8–10.8)

## 2021-02-17 LAB — COMPLETE METABOLIC PANEL WITH GFR
AG Ratio: 2.3 (calc) (ref 1.0–2.5)
ALT: 14 U/L (ref 6–29)
AST: 20 U/L (ref 10–35)
Albumin: 4.5 g/dL (ref 3.6–5.1)
Alkaline phosphatase (APISO): 45 U/L (ref 37–153)
BUN: 13 mg/dL (ref 7–25)
CO2: 29 mmol/L (ref 20–32)
Calcium: 9.5 mg/dL (ref 8.6–10.4)
Chloride: 104 mmol/L (ref 98–110)
Creat: 0.8 mg/dL (ref 0.50–1.05)
Globulin: 2 g/dL (calc) (ref 1.9–3.7)
Glucose, Bld: 84 mg/dL (ref 65–99)
Potassium: 4.2 mmol/L (ref 3.5–5.3)
Sodium: 141 mmol/L (ref 135–146)
Total Bilirubin: 0.5 mg/dL (ref 0.2–1.2)
Total Protein: 6.5 g/dL (ref 6.1–8.1)
eGFR: 81 mL/min/{1.73_m2} (ref >=60)

## 2021-02-17 LAB — LIPID PANEL W/REFLEX DIRECT LDL
Cholesterol: 231 mg/dL — ABNORMAL HIGH (ref <200)
HDL: 92 mg/dL (ref >=50)
LDL Cholesterol (Calc): 127 mg/dL (calc) — ABNORMAL HIGH
Non-HDL Cholesterol (Calc): 139 mg/dL (calc) — ABNORMAL HIGH (ref <130)
Total CHOL/HDL Ratio: 2.5 (calc) (ref <5.0)
Triglycerides: 39 mg/dL (ref <150)

## 2021-02-17 LAB — TSH: TSH: 2.51 mIU/L (ref 0.40–4.50)

## 2021-02-17 LAB — IRON,TIBC AND FERRITIN PANEL
%SAT: 33 % (calc) (ref 16–45)
Ferritin: 46 ng/mL (ref 16–288)
Iron: 105 ug/dL (ref 45–160)
TIBC: 315 mcg/dL (calc) (ref 250–450)

## 2021-02-17 LAB — VITAMIN D 25 HYDROXY (VIT D DEFICIENCY, FRACTURES): Vit D, 25-Hydroxy: 48 ng/mL (ref 30–100)

## 2021-02-17 NOTE — Progress Notes (Signed)
Kellie [name],   LDL improved. Looks great.  Kidney, liver, glucose look great.  Vitamin D has dropped but still in normal range. Have you changed your unitis or the way you take?  Hemoglobin normal.  Thyroid normal range.  Iron looks good.

## 2021-02-20 DIAGNOSIS — L988 Other specified disorders of the skin and subcutaneous tissue: Secondary | ICD-10-CM | POA: Diagnosis not present

## 2021-02-20 DIAGNOSIS — X32XXXA Exposure to sunlight, initial encounter: Secondary | ICD-10-CM | POA: Diagnosis not present

## 2021-02-20 DIAGNOSIS — B078 Other viral warts: Secondary | ICD-10-CM | POA: Diagnosis not present

## 2021-02-20 DIAGNOSIS — D485 Neoplasm of uncertain behavior of skin: Secondary | ICD-10-CM | POA: Diagnosis not present

## 2021-02-20 DIAGNOSIS — L570 Actinic keratosis: Secondary | ICD-10-CM | POA: Diagnosis not present

## 2021-02-20 NOTE — Progress Notes (Signed)
You may just need to take with protein/fat for better absorption. Are you taking on an empty stomach?

## 2021-02-27 ENCOUNTER — Other Ambulatory Visit (HOSPITAL_COMMUNITY): Payer: Self-pay | Admitting: Psychiatry

## 2021-02-27 DIAGNOSIS — M79605 Pain in left leg: Secondary | ICD-10-CM

## 2021-02-27 DIAGNOSIS — M79604 Pain in right leg: Secondary | ICD-10-CM

## 2021-02-27 DIAGNOSIS — R200 Anesthesia of skin: Secondary | ICD-10-CM

## 2021-02-27 DIAGNOSIS — R202 Paresthesia of skin: Secondary | ICD-10-CM

## 2021-03-01 ENCOUNTER — Other Ambulatory Visit: Payer: Self-pay

## 2021-03-01 ENCOUNTER — Ambulatory Visit (INDEPENDENT_AMBULATORY_CARE_PROVIDER_SITE_OTHER): Payer: Medicare HMO

## 2021-03-01 ENCOUNTER — Encounter: Payer: Self-pay | Admitting: Physician Assistant

## 2021-03-01 DIAGNOSIS — M810 Age-related osteoporosis without current pathological fracture: Secondary | ICD-10-CM

## 2021-03-01 DIAGNOSIS — Z780 Asymptomatic menopausal state: Secondary | ICD-10-CM

## 2021-03-01 DIAGNOSIS — D250 Submucous leiomyoma of uterus: Secondary | ICD-10-CM | POA: Diagnosis not present

## 2021-03-01 DIAGNOSIS — D259 Leiomyoma of uterus, unspecified: Secondary | ICD-10-CM

## 2021-03-06 ENCOUNTER — Encounter: Payer: Self-pay | Admitting: Physician Assistant

## 2021-03-06 DIAGNOSIS — H43811 Vitreous degeneration, right eye: Secondary | ICD-10-CM | POA: Diagnosis not present

## 2021-03-06 DIAGNOSIS — H11153 Pinguecula, bilateral: Secondary | ICD-10-CM | POA: Diagnosis not present

## 2021-03-06 DIAGNOSIS — H25013 Cortical age-related cataract, bilateral: Secondary | ICD-10-CM | POA: Diagnosis not present

## 2021-03-06 DIAGNOSIS — H0288B Meibomian gland dysfunction left eye, upper and lower eyelids: Secondary | ICD-10-CM | POA: Diagnosis not present

## 2021-03-06 DIAGNOSIS — H02831 Dermatochalasis of right upper eyelid: Secondary | ICD-10-CM | POA: Diagnosis not present

## 2021-03-06 DIAGNOSIS — H2513 Age-related nuclear cataract, bilateral: Secondary | ICD-10-CM | POA: Diagnosis not present

## 2021-03-06 DIAGNOSIS — H02834 Dermatochalasis of left upper eyelid: Secondary | ICD-10-CM | POA: Diagnosis not present

## 2021-03-06 DIAGNOSIS — H0288A Meibomian gland dysfunction right eye, upper and lower eyelids: Secondary | ICD-10-CM | POA: Diagnosis not present

## 2021-03-06 DIAGNOSIS — H04123 Dry eye syndrome of bilateral lacrimal glands: Secondary | ICD-10-CM | POA: Diagnosis not present

## 2021-03-06 NOTE — Progress Notes (Signed)
Unchanged fibroids.

## 2021-03-06 NOTE — Progress Notes (Signed)
2020 bone density score was -3.4. 2022 bone density score is -3.3 very small improvement but not worsening. Make sure continue to take vitamin D and calcium as well as exercise regularly. We could discuss switching fosamax to prolia to see if we could be a modest improvement. Prolia is an every 6 month injection. Thoughts?

## 2021-03-09 DIAGNOSIS — J189 Pneumonia, unspecified organism: Secondary | ICD-10-CM | POA: Diagnosis not present

## 2021-03-09 DIAGNOSIS — J1282 Pneumonia due to coronavirus disease 2019: Secondary | ICD-10-CM | POA: Diagnosis not present

## 2021-03-09 DIAGNOSIS — U071 COVID-19: Secondary | ICD-10-CM | POA: Diagnosis not present

## 2021-03-27 ENCOUNTER — Telehealth: Payer: Self-pay | Admitting: Pharmacist

## 2021-03-27 NOTE — Chronic Care Management (AMB) (Signed)
Chronic Care Management Pharmacy Assistant   Name: Kellie [name]  MRN: [id number] DOB: Mar 04, 1956  Kellie [name] is an 65 y.o. year old female who presents for her initial CCM visit with the clinical pharmacist.   Recent office visits:  02/15/21 Kellie L. Breeback PA-C PCP- pt was seen for her medicare annual wellness exam. Labs were ordered and a pelvic w/ u/s was done in office. Provider placed a referral to [facility] and started pt on Benzonatate 200 mg BID PRN and Famotidine 40 mg at bedtime. Provider also disc. Pt guaiFENesin 200 mg and Prednisone 20 mg. F/U in 3 months.  01/31/21 Kellie Reeve DO (video)- pt was seen for COVID-19. Pt was started on guaiFENesin 200 mg QID PRN and prednisone 20 mg w/ breakfast. Provider placed order for incentive spirometer and changed pt's Albuterol to inhaler. F/U PRN  Recent consult visits:  Kellie [name] Hospital visits:  None in previous 6 months  Medications: Outpatient Encounter Medications as of 03/27/2021  Medication Sig   acyclovir (ZOVIRAX) 200 MG capsule TAKE 2 CAPSULES BY MOUTH TWICE DAILY.   albuterol (VENTOLIN HFA) 108 (90 Base) MCG/ACT inhaler Inhale 2 puffs into the lungs every 4 (four) hours as needed for wheezing or shortness of breath.   alendronate (FOSAMAX) 70 MG tablet TAKE 1 TABLET BY MOUTH ONCE A WEEK WITH 8 OUNCE GLASS OF WATER 30 MIN BEFORE BREAKFAST AND OTHER MEDS. DON'T LIE DOWN FOR 30 MINUTES.   ALPRAZolam (XANAX) 0.5 MG tablet TAKE 1 TABLET ONCE DAILY FOR ANXIETY.   AMBULATORY NON FORMULARY MEDICATION Incentive spirometer use as directed - fax to [facility] [phone]   atorvastatin (LIPITOR) 10 MG tablet Take 1 tablet (10 mg total) by mouth daily.   benzonatate (TESSALON) 200 MG capsule Take 1 capsule (200 mg total) by mouth 2 (two) times daily as needed for cough.   BIOTIN PO Take 1 tablet by mouth daily.   budesonide-formoterol (SYMBICORT) 160-4.5 MCG/ACT inhaler Inhale 2 puffs into the lungs 2 (two) times  daily.   Cholecalciferol (VITAMIN D) 2000 units CAPS Take by mouth.   citalopram (CELEXA) 10 MG tablet TAKE (1) TABLET BY MOUTH ONCE DAILY.   cyclobenzaprine (FLEXERIL) 10 MG tablet One half to one tab PO qHS   famotidine (PEPCID) 40 MG tablet Take 1 tablet (40 mg total) by mouth at bedtime.   gabapentin (NEURONTIN) 100 MG capsule Take 1 to 3 tablets up to three times day.   hydrocortisone (ANUSOL-HC) 25 MG suppository Place 1 suppository (25 mg total) rectally 2 (two) times daily as needed for hemorrhoids.   meloxicam (MOBIC) 15 MG tablet TAKE 1 TABLET IN THE MORNING WITH BREAKFAST FOR 2 WEEKS, THEN DAILY AS NEEDED FOR PAIN.   Multiple Vitamins-Minerals (ZINC PO) Take 1 tablet by mouth daily.   pantoprazole (PROTONIX) 40 MG tablet TAKE (1) TABLET BY MOUTH TWICE DAILY.   Probiotic Product (PROBIOTIC & ACIDOPHILUS EX ST PO) Take by mouth.    Spacer/Aero-Holding Chambers (AEROCHAMBER MV) inhaler Use as instructed   No facility-administered encounter medications on file as of 03/27/2021.    Current Medication List  acyclovir (ZOVIRAX) 200 MG capsule last filled 11/28/20 30 DS albuterol 108 (90 Base) MCG/ACT inhaler last filled 01/31/21 17 DS alendronate (FOSAMAX) 70 MG tablet last filled 09/26/20 28 DS ALPRAZolam (XANAX) 0.5 MG tablet last filled 02/27/21 30 DS atorvastatin (LIPITOR) 10 MG tablet last filled 09/20/20 90 DS benzonatate (TESSALON) 200 MG capsule last filled 02/15/21 10 DS BIOTIN PO  SYMBICORT 160-4.5 MCG/ACT inhaler last filled 01/13/20  Cholecalciferol (VITAMIN D) 2000 units CAPS citalopram (CELEXA) 10 MG tablet last filled 02/28/21 30 DS cyclobenzaprine (FLEXERIL) 10 MG tablet last filled 10/29/19 30 DS famotidine (PEPCID) 40 MG tablet last filled 02/15/21 90 DS gabapentin (NEURONTIN) 100 MG capsule last filled 12/27/20 90 DS Multiple Vitamins-Minerals (ZINC PO) pantoprazole (PROTONIX) 40 MG tablet last filled 11/28/20 30 DS Probiotic Product  Spacer/Aero-Holding Chambers (AEROCHAMBER MV)  inhaler  [place] Pharmacist Assistant [phone]

## 2021-03-28 ENCOUNTER — Other Ambulatory Visit (HOSPITAL_COMMUNITY): Payer: Self-pay | Admitting: Psychiatry

## 2021-03-28 ENCOUNTER — Other Ambulatory Visit: Payer: Self-pay | Admitting: Physician Assistant

## 2021-03-28 DIAGNOSIS — F418 Other specified anxiety disorders: Secondary | ICD-10-CM

## 2021-03-28 DIAGNOSIS — F419 Anxiety disorder, unspecified: Secondary | ICD-10-CM

## 2021-03-28 NOTE — Telephone Encounter (Signed)
Last written 09/20/2020 #30 with 5 refills Last visit Aug 2022

## 2021-03-31 ENCOUNTER — Telehealth: Payer: Medicare HMO

## 2021-03-31 ENCOUNTER — Telehealth: Payer: Self-pay | Admitting: Pharmacist

## 2021-03-31 NOTE — Telephone Encounter (Signed)
Unsuccessful attempt x2 to contact patient for initial phone call for CCM services with pharmacist.  Will route to schedule team for reschedule.

## 2021-03-31 NOTE — Progress Notes (Deleted)
Current Barriers:  {PHARMCACYBARRIERS:21091514}  Pharmacist Clinical Goal(s):  Over the next *** days, patient will {PHARMACYGOALCHOICES:25079} through collaboration with PharmD and provider.   Interventions: 1:1 collaboration with Donella Stade, PA-C regarding development and update of comprehensive plan of care as evidenced by provider attestation and co-signature Inter-disciplinary care team collaboration (see longitudinal plan of care) Comprehensive medication review performed; medication list updated in electronic medical record  Hyperlipidemia:  Uncontrolled/controlled; current treatment:***;   Medications previously tried: ***   Current dietary patterns: ***  {PHARMACYINTERVENTION:21091513} , Asthma:  Uncontrolled/controlled; current treatment:***;  ACT score:   {PHARMACYINTERVENTION:21091513}, and  Osteoporosis:  Current treatment:***;   Last DEXA: ***, t score ***  Supplementation: ***  Patient Goals/Self-Care Activities Over the next *** days, patient will:  {PHARMACYPATIENTGOALS:25081}  Follow Up Plan: {CM FOLLOW UP RAXE:94076} ***     Chronic Care Management Pharmacy Note  03/31/2021 Name:  Kellie [name] MRN:  [id number] DOB:  Apr 11, 1956  Summary:  Recommendations/Changes made from today's visit:  Plan:  Subjective: Kellie [name] is an 65 y.o. year old female who is a primary patient of Donella Stade, PA-C.  The CCM team was consulted for assistance with disease management and care coordination needs.    {CCMTELEPHONEFACETOFACE:21091510} for {CCMINITIALFOLLOWUPCHOICE:21091511} in response to provider referral for pharmacy case management and/or care coordination services.   Consent to Services:  {CCMCONSENTOPTIONS:25074}  Patient Care Team: Kellie [name] as PCP - General (Family Medicine) Kellie [name], [facility] as Pharmacist (Pharmacist)  Recent office visits:  02/15/21 Jade L. Breeback PA-C PCP- pt was seen for her medicare annual  wellness exam. Labs were ordered and a pelvic w/ u/s was done in office. Provider placed a referral to [facility] and started pt on Benzonatate 200 mg BID PRN and Famotidine 40 mg at bedtime. Provider also disc. Pt guaiFENesin 200 mg and Prednisone 20 mg. F/U in 3 months.   01/31/21 Emeterio Reeve DO (video)- pt was seen for COVID-19. Pt was started on guaiFENesin 200 mg QID PRN and prednisone 20 mg w/ breakfast. Provider placed order for incentive spirometer and changed pt's Albuterol to inhaler. F/U PRN   Recent consult visits:  [facility] visits:  None in previous 6 months    Objective:  Lab Results  Component Value Date   CREATININE 0.80 02/16/2021   CREATININE 0.79 09/20/2020   CREATININE 0.81 06/22/2020    Lab Results  Component Value Date   HGBA1C 5.5 02/13/2019   Last diabetic Eye exam: No results found for: HMDIABEYEEXA  Last diabetic Foot exam: No results found for: HMDIABFOOTEX      Component Value Date/Time   CHOL 231 (H) 02/16/2021 0812   TRIG 39 02/16/2021 0812   HDL 92 02/16/2021 0812   CHOLHDL 2.5 02/16/2021 0812   VLDL 10 05/03/2016 0952   LDLCALC 127 (H) 02/16/2021 0812    Hepatic Function Latest Ref Rng & Units 02/16/2021 09/20/2020 06/22/2020  Total Protein 6.1 - 8.1 g/dL 6.5 6.5 6.2  Albumin 3.5 - 5.0 g/dL - - -  AST 10 - 35 U/L '20 18 19  ' ALT 6 - 29 U/L '14 13 20  ' Alk Phosphatase 38 - 126 U/L - - -  Total Bilirubin 0.2 - 1.2 mg/dL 0.5 0.4 0.5  Bilirubin, Direct 0.0 - 0.3 mg/dL - - -    Lab Results  Component Value Date/Time   TSH 2.51 02/16/2021 08:12 AM   TSH 1.75 09/20/2020 12:00 AM   FREET4 1.1 05/01/2017 09:36 AM  CBC Latest Ref Rng & Units 02/16/2021 09/20/2020 06/22/2020  WBC 3.8 - 10.8 Thousand/uL 3.9 4.7 4.0  Hemoglobin 11.7 - 15.5 g/dL 12.5 13.0 11.4(L)  Hematocrit 35.0 - 45.0 % 38.9 39.3 34.0(L)  Platelets 140 - 400 Thousand/uL 320 309 298    Lab Results  Component Value Date/Time   VD25OH 48 02/16/2021 08:12 AM   VD25OH 60  12/03/2019 08:14 AM    Clinical ASCVD: {YES/NO:21197} The 10-year ASCVD risk score (Arnett DK, et al., 2019) is: 4.1%   Values used to calculate the score:     Age: 41 years     Sex: Female     Is Non-Hispanic African American: No     Diabetic: No     Tobacco smoker: No     Systolic Blood Pressure: 400 mmHg     Is BP treated: No     HDL Cholesterol: 92 mg/dL     Total Cholesterol: 231 mg/dL    Other: (CHADS2VASc if Afib, PHQ9 if depression, MMRC or CAT for COPD, ACT, DEXA)  Social History   Tobacco Use  Smoking Status Never  Smokeless Tobacco Never   BP Readings from Last 3 Encounters:  02/15/21 110/70  09/20/20 101/62  06/21/20 120/72   Pulse Readings from Last 3 Encounters:  02/15/21 74  09/20/20 78  06/21/20 87   Wt Readings from Last 3 Encounters:  02/15/21 111 lb 1.3 oz (50.4 kg)  01/31/21 108 lb (49 kg)  09/20/20 113 lb (51.3 kg)    Assessment: Review of patient past medical history, allergies, medications, health status, including review of consultants reports, laboratory and other test data, was performed as part of comprehensive evaluation and provision of chronic care management services.   SDOH:  (Social Determinants of Health) assessments and interventions performed:    CCM Care Plan  Allergies  Allergen Reactions   Codeine Nausea And Vomiting   Hydrocodone Nausea And Vomiting   Morphine Nausea And Vomiting   Abilify [Aripiprazole]     Nausea/vomiting/weakness/shaky   Morphine And Related Nausea And Vomiting   Percocet [Oxycodone-Acetaminophen] Nausea And Vomiting and Other (See Comments)    Patient states it makes blood pressure drop too much   Vicodin [Hydrocodone-Acetaminophen] Nausea And Vomiting    Medications Reviewed Today     Reviewed by Donella Stade, PA-C (Physician Assistant) on 02/17/21 at [phone]  Med List Status: <None>   Medication Order Taking? Sig Documenting Provider Last Dose Status Informant  acyclovir (ZOVIRAX) 200 MG  capsule [id number] Yes TAKE 2 CAPSULES BY MOUTH TWICE DAILY. Breeback, Jade L, PA-C Taking Active   albuterol (VENTOLIN HFA) 108 (90 Base) MCG/ACT inhaler [id number] Yes Inhale 2 puffs into the lungs every 4 (four) hours as needed for wheezing or shortness of breath. Emeterio Reeve, DO Taking Active   alendronate (FOSAMAX) 70 MG tablet [id number] Yes TAKE 1 TABLET BY MOUTH ONCE A WEEK WITH 8 OUNCE GLASS OF WATER 30 MIN BEFORE BREAKFAST AND OTHER MEDS. DON'T LIE DOWN FOR 30 MINUTES. Donella Stade, PA-C Taking Active   ALPRAZolam Duanne Moron) 0.5 MG tablet [id number] Yes TAKE 1 TABLET ONCE DAILY FOR ANXIETY. Donella Stade, PA-C Taking Active   Camillo Flaming MEDICATION [id number] Yes Incentive spirometer use as directed - fax to [place] Emeterio Reeve, DO Taking Active   atorvastatin (LIPITOR) 10 MG tablet [id number] Yes Take 1 tablet (10 mg total) by mouth daily. Donella Stade, PA-C Taking Active   benzonatate (TESSALON) 200 MG capsule [id number]  Yes Take 1 capsule (200 mg total) by mouth 2 (two) times daily as needed for cough. Kellie [name]  Active   BIOTIN PO [id number] Yes Take 1 tablet by mouth daily. [provider] Taking Active   budesonide-formoterol (SYMBICORT) 160-4.5 MCG/ACT inhaler [id number] Yes Inhale 2 puffs into the lungs 2 (two) times daily. Donella Stade, PA-C Taking Active   Cholecalciferol (VITAMIN D) 2000 units CAPS [id number] Yes Take by mouth. [provider] Taking Active   citalopram (CELEXA) 10 MG tablet [id number] Yes TAKE (1) TABLET BY MOUTH ONCE DAILY. Merian Capron, MD Taking Active   cyclobenzaprine (FLEXERIL) 10 MG tablet [id number] Yes One half to one tab PO qHS Sanjuana Kava, MD Taking Active   famotidine (PEPCID) 40 MG tablet [id number] Yes Take 1 tablet (40 mg total) by mouth at bedtime. Donella Stade, PA-C  Active   gabapentin (NEURONTIN) 100 MG capsule [id number] Yes Take 1 to 3 tablets up  to three times day. Donella Stade, PA-C Taking Active   hydrocortisone (ANUSOL-HC) 25 MG suppository [id number] Yes Place 1 suppository (25 mg total) rectally 2 (two) times daily as needed for hemorrhoids. Donella Stade, PA-C Taking Active   meloxicam (MOBIC) 15 MG tablet [id number] Yes TAKE 1 TABLET IN THE MORNING WITH BREAKFAST FOR 2 WEEKS, THEN DAILY AS NEEDED FOR PAIN. Orma Render, NP Taking Active   Multiple Vitamins-Minerals (ZINC PO) [id number] Yes Take 1 tablet by mouth daily. [provider] Taking Active   pantoprazole (PROTONIX) 40 MG tablet [id number] Yes TAKE (1) TABLET BY MOUTH TWICE DAILY. Zehr, Laban Emperor, PA-C Taking Active   Probiotic Product (PROBIOTIC & ACIDOPHILUS EX ST PO) [id number] Yes Take by mouth.  [provider] Taking Active   Spacer/Aero-Holding Chambers (AEROCHAMBER MV) inhaler [id number] Yes Use as instructed Hunsucker, Bonna Gains, MD Taking Active             Patient Active Problem List   Diagnosis Date Noted   Nonrheumatic aortic valve insufficiency 09/20/2020   Bilateral leg pain 09/20/2020   Numbness and tingling of both feet 09/20/2020   Family history of stroke 09/20/2020   Ankle edema, bilateral 09/20/2020   Anxiety about health 09/20/2020   Breast pain, right 07/11/2020   Leukopenia 06/21/2020   Low serum calcium 06/21/2020   Belching 03/23/2020   Globin abnormality ([place]) 02/19/2020   Hiatal hernia 02/19/2020   Gastroesophageal reflux disease with esophagitis 02/19/2020   History of iron deficiency 02/15/2020   Hyperinflation of lungs 01/15/2020   Vitamin D deficiency 12/02/2019   No energy 12/02/2019   Rectal bleeding 12/02/2019   Dysphagia 12/02/2019   Tenderness of neck 12/02/2019   Anterolisthesis 07/28/2019   Reactive airway disease without asthma 04/17/2019   Plantar fasciitis, right 04/02/2019   Vitamin D insufficiency 02/16/2019   Cervical stenosis (uterine cervix) 02/13/2019   Chronic pain of both ankles  01/15/2019   Cortical age-related cataract of both eyes 12/22/2018   Pinguecula of both eyes 12/22/2018   Nuclear sclerotic cataract of both eyes 12/22/2018   History of diverticulitis 09/16/2018   Vaginal dryness 09/02/2018   Dermatochalasis of both upper eyelids 03/13/2018   Periodic limb movement 03/13/2018   Nocturnal leg cramps 03/13/2018   Aortic insufficiency 01/10/2018   Non-restorative sleep 01/10/2018   Snoring 01/10/2018   Atypical chest pain 10/22/2017   Anxiety 10/22/2017   History of loop electrical excision procedure (LEEP) 07/01/2017   Unintentional weight loss 05/18/2017   Elevated LDL cholesterol level  05/18/2017   Hyperlipidemia 05/18/2017   Irritable bowel syndrome with both constipation and diarrhea 05/18/2017   Family history of pancreatic cancer 02/18/2017   Diarrhea 02/18/2017   Loss of weight 02/18/2017   Weakness 10/05/2016   Panic attacks 09/18/2016   SOB (shortness of breath) 09/18/2016   Bipolar 1 disorder with moderate mania ([employer]) 09/17/2016   Pulmonary nodule 08/08/2016   Hoarseness 03/09/2016   Left knee pain 11/09/2015   Chronic pain of both knees 11/09/2015   Plantar fasciitis, bilateral 11/09/2015   Cough, persistent 07/21/2015   Post-nasal drip 07/21/2015   DDD (degenerative disc disease), cervical 07/05/2015   Chronic nasal congestion 03/01/2015   Vitreous floaters of right eye 03/01/2015   Near syncope 02/09/2015   Rhinitis, allergic 10/14/2014   Family history of renal cancer 10/11/2014   DDD (degenerative disc disease), lumbar 10/11/2014   Bipolar disorder with depression ([place]) 06/02/2014   Postmenopausal atrophic vaginitis 03/17/2014   Prediabetes 12/14/2013   Encounter for gynecological examination with Papanicolaou smear of cervix 12/08/2013   Adhesive capsulitis of right shoulder 11/24/2013   Chronic periodontal disease 06/30/2013   Genital herpes 11/05/2012   Hemorrhoids 11/05/2012   Borderline intellectual functioning  02/15/2012   Osteoporosis 11/21/2011   Cough 06/02/2011   Allergic rhinitis 11/08/2010   Insomnia 11/08/2010   GERD (gastroesophageal reflux disease) 09/27/2010    Immunization History  Administered Date(s) Administered   Tdap 06/25/2005    Conditions to be addressed/monitored: {CCM ASSESSMENT DISEASE OPTIONS:25047}  There are no care plans that you recently modified to display for this patient.   Medication Assistance: {MEDASSISTANCEINFO:25044}  Patient's preferred pharmacy is:  [facility], [place] [facility] [address] [place] [address] Phone: [phone] Fax: [phone]  Uses pill box? {Yes or If no, why not?:20788} Pt endorses ***% compliance  Follow Up:  {FOLLOWUP:24991}  Plan: {CM FOLLOW UP PLAN:25073}  SIG***

## 2021-03-31 NOTE — Telephone Encounter (Signed)
Patient's appt has been cancelled to avoid no show fee. AM

## 2021-04-03 NOTE — Chronic Care Management (AMB) (Signed)
  Care Management   Note  04/03/2021 Name: Kellie [name] MRN: [id number] DOB: March 08, 1956  Kellie [name] is a 65 y.o. year old female who is a primary care patient of Lavada Mesi and is actively engaged with the care management team. I reached out to [name] by phone today to assist with re-scheduling a follow up visit with the Pharmacist  Follow up plan: Unsuccessful telephone outreach attempt made. A HIPAA compliant phone message was left for the patient providing contact information and requesting a return call.   Julian Hy, [place] Management  Direct Dial: [phone]

## 2021-04-05 NOTE — Chronic Care Management (AMB) (Signed)
  Care Management   Note  04/05/2021 Name: Kellie [name] MRN: [id number] DOB: 03-16-1956  Kellie [name] is a 65 y.o. year old female who is a primary care patient of Lavada Mesi and is actively engaged with the care management team. I reached out to [name] by phone today to assist with re-scheduling a follow up visit with the Pharmacist  Follow up plan: Patient declines further follow up and engagement by the care management team. Appropriate care team members and provider have been notified via electronic communication.   Julian Hy, [place] Management  Direct Dial: [phone]

## 2021-04-08 DIAGNOSIS — U071 COVID-19: Secondary | ICD-10-CM | POA: Diagnosis not present

## 2021-04-08 DIAGNOSIS — J189 Pneumonia, unspecified organism: Secondary | ICD-10-CM | POA: Diagnosis not present

## 2021-04-17 ENCOUNTER — Encounter: Payer: Self-pay | Admitting: Physician Assistant

## 2021-04-18 ENCOUNTER — Encounter: Payer: Self-pay | Admitting: Family Medicine

## 2021-04-18 ENCOUNTER — Ambulatory Visit (INDEPENDENT_AMBULATORY_CARE_PROVIDER_SITE_OTHER): Payer: Medicare HMO

## 2021-04-18 ENCOUNTER — Ambulatory Visit (INDEPENDENT_AMBULATORY_CARE_PROVIDER_SITE_OTHER): Payer: Medicare HMO | Admitting: Family Medicine

## 2021-04-18 ENCOUNTER — Other Ambulatory Visit: Payer: Self-pay

## 2021-04-18 VITALS — BP 112/59 | HR 76 | Temp 98.2°F | Wt 115.1 lb

## 2021-04-18 DIAGNOSIS — H538 Other visual disturbances: Secondary | ICD-10-CM

## 2021-04-18 DIAGNOSIS — G4452 New daily persistent headache (NDPH): Secondary | ICD-10-CM

## 2021-04-18 DIAGNOSIS — M542 Cervicalgia: Secondary | ICD-10-CM | POA: Diagnosis not present

## 2021-04-18 DIAGNOSIS — R7303 Prediabetes: Secondary | ICD-10-CM | POA: Diagnosis not present

## 2021-04-18 DIAGNOSIS — R42 Dizziness and giddiness: Secondary | ICD-10-CM | POA: Diagnosis not present

## 2021-04-18 LAB — POCT GLYCOSYLATED HEMOGLOBIN (HGB A1C): Hemoglobin A1C: 5.6 % (ref 4.0–5.6)

## 2021-04-18 NOTE — Patient Instructions (Signed)
CT head and neck xray ordered Continue with antiinflammatory (meloxicam), tylenol, heat, stretches, etc. We will let you know results  If you develop any symptoms of stroke, facial asymmetry/drooping, slurred speech, one sided weakness, chest pain, trouble breathing, confusion, etc, go to the Emergency Department.
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
## 2021-04-19 ENCOUNTER — Encounter: Payer: Self-pay | Admitting: Physician Assistant

## 2021-04-21 ENCOUNTER — Ambulatory Visit (INDEPENDENT_AMBULATORY_CARE_PROVIDER_SITE_OTHER): Payer: Medicare HMO

## 2021-04-21 ENCOUNTER — Other Ambulatory Visit: Payer: Self-pay

## 2021-04-21 DIAGNOSIS — G4452 New daily persistent headache (NDPH): Secondary | ICD-10-CM | POA: Diagnosis not present

## 2021-04-21 DIAGNOSIS — R42 Dizziness and giddiness: Secondary | ICD-10-CM

## 2021-04-21 DIAGNOSIS — H538 Other visual disturbances: Secondary | ICD-10-CM | POA: Diagnosis not present

## 2021-04-21 DIAGNOSIS — M542 Cervicalgia: Secondary | ICD-10-CM | POA: Diagnosis not present

## 2021-04-21 DIAGNOSIS — R519 Headache, unspecified: Secondary | ICD-10-CM | POA: Diagnosis not present

## 2021-04-28 ENCOUNTER — Other Ambulatory Visit (HOSPITAL_COMMUNITY): Payer: Self-pay | Admitting: Psychiatry

## 2021-04-28 ENCOUNTER — Other Ambulatory Visit: Payer: Self-pay | Admitting: Physician Assistant

## 2021-04-28 DIAGNOSIS — A6004 Herpesviral vulvovaginitis: Secondary | ICD-10-CM

## 2021-05-02 ENCOUNTER — Encounter: Payer: Self-pay | Admitting: Physician Assistant

## 2021-05-02 ENCOUNTER — Telehealth: Payer: Self-pay | Admitting: Neurology

## 2021-05-02 ENCOUNTER — Ambulatory Visit (INDEPENDENT_AMBULATORY_CARE_PROVIDER_SITE_OTHER): Payer: Medicare HMO | Admitting: Physician Assistant

## 2021-05-02 DIAGNOSIS — M5030 Other cervical disc degeneration, unspecified cervical region: Secondary | ICD-10-CM

## 2021-05-02 DIAGNOSIS — F418 Other specified anxiety disorders: Secondary | ICD-10-CM

## 2021-05-02 DIAGNOSIS — M5412 Radiculopathy, cervical region: Secondary | ICD-10-CM | POA: Diagnosis not present

## 2021-05-02 MED ORDER — GABAPENTIN 300 MG PO CAPS: 300.0000 mg | ORAL_CAPSULE | Freq: Three times a day (TID) | ORAL | 0 refills | Status: DC

## 2021-05-02 MED ORDER — PREDNISONE 50 MG PO TABS: ORAL_TABLET | ORAL | 0 refills | Status: DC

## 2021-05-02 NOTE — Progress Notes (Signed)
Saw Kellie [name] 04/18/2021 for neck pain Had xray Wanted to discuss results with [name] Cervical Spine Complete (Accession [id number]) (Order [id number]) Imaging Date: 04/18/2021 Department: [facility] Released By: Starleen Blue Authorizing: Terrilyn Saver, NP   Exam Status  Status  Final [99]   PACS Inteler[facility] Image Link   Show images for DG Cervical Spine Complete Study Result  Narrative & Impression  CLINICAL DATA:  Neck pain with bilateral radiculopathy   EXAM: CERVICAL SPINE - COMPLETE 4+ VIEW   COMPARISON:  None.   FINDINGS: Normal cervical lordosis. No acute fracture. 2 mm retrolisthesis C3 upon C4 and 3 mm anterolisthesis of C4 upon C5 is likely degenerative in nature. Mild intervertebral disc space narrowing and endplate remodeling is seen at C4-C6, most severe at C5-6 in keeping with changes of mild degenerative disc disease. Vertebral body height and intervertebral disc heights are preserved. The prevertebral soft tissues are not thickened. The spinal canal is widely patent. Oblique views demonstrate mild neuroforaminal narrowing on the right at C4-5 and C5-6 and on the left at C5-6 secondary to uncovertebral arthrosis.   IMPRESSION: No acute fracture or listhesis. Degenerative changes as outlined above with mild bilateral multilevel neuroforaminal narrowing.     Electronically Signed   By: Fidela Salisbury M.D.   On: 04/19/2021 14:42     Result History  DG Cervical Spine Complete (Order #[id number]) on 04/19/2021 - Order Result History Report   DG Cervical Spine Complete: Result Notes   [name], [name]  04/20/2021  9:25 AM EDT      Seen by patient [name] on 04/19/2021  5:22 PM   Terrilyn Saver, NP  04/19/2021  5:03 PM EDT      Your neck xray did show any acute fractures. You do have some degenerative disk disease ("wear and tear") like was seen on your prior images. Continue the supportive  measures we discussed. Please contact office for follow-up if symptoms do not improve or worsen. Seek emergency care if symptoms become severe.  [name]-  PT at house.

## 2021-05-02 NOTE — Telephone Encounter (Signed)
Patient left a vm wanting to speak directly to [facility] about her neck. Please schedule a virtual visit so she can discuss with her directly. [phone].

## 2021-05-02 NOTE — Telephone Encounter (Signed)
LVM for patient to call back to get this appt scheduled. AM

## 2021-05-03 ENCOUNTER — Encounter: Payer: Self-pay | Admitting: Physician Assistant

## 2021-05-03 DIAGNOSIS — M5412 Radiculopathy, cervical region: Secondary | ICD-10-CM | POA: Insufficient documentation

## 2021-05-03 NOTE — Progress Notes (Signed)
..Virtual Visit via Telephone Note  I connected with Kellie [name] on 05/02/2021 at  3:40 PM EST by telephone and verified that I am speaking with the correct person using two identifiers.  Location: Patient: home Provider: clinic  .[name] KitchenParticipating in visit:  Patient: Kellie [name] Provider:Tekeya Geffert Alden Hipp PA-C   I discussed the limitations, risks, security and privacy concerns of performing an evaluation and management service by telephone and the availability of in person appointments. I also discussed with the patient that there may be a patient responsible charge related to this service. The patient expressed understanding and agreed to proceed.   History of Present Illness: Pt is a 65 yo female with recent exacerbation of neck pain, headache and numbness and tingling radiating down arms. She saw Caleen Jobs 10/25. No new injury.  Xray of neck was done.  FINDINGS: Normal cervical lordosis. No acute fracture. 2 mm retrolisthesis C3 upon C4 and 3 mm anterolisthesis of C4 upon C5 is likely degenerative in nature. Mild intervertebral disc space narrowing and endplate remodeling is seen at C4-C6, most severe at C5-6 in keeping with changes of mild degenerative disc disease. Vertebral body height and intervertebral disc heights are preserved. The prevertebral soft tissues are not thickened. The spinal canal is widely patent. Oblique views demonstrate mild neuroforaminal narrowing on the right at C4-5 and C5-6 and on the left at C5-6 secondary to uncovertebral arthrosis.   IMPRESSION: No acute fracture or listhesis. Degenerative changes as outlined above with mild bilateral multilevel neuroforaminal narrowing.   She has had progressive pain since and intermittent numbness and tingling radiating down both arms. She is very worried. She made appt with Dr. Lynann Bologna, orthopedics, for Tuesday and wants [place] to send referral over. She is interested in physical therapy but does not feel like she can  drive due to her dizziness. She would like home PT. Taking mobic and gabapentin. Gabapentin seems to help the most.       Observations/Objective: Very anxious about neck pain and overall health  .[name] KitchenThere were no vitals filed for this visit. There is no height or weight on file to calculate BMI.    Assessment and Plan: [name] KitchenMarland KitchenRowyn was seen today for neck pain.  Diagnoses and all orders for this visit:  DDD (degenerative disc disease), cervical -     gabapentin (NEURONTIN) 300 MG capsule; Take 1 capsule (300 mg total) by mouth 3 (three) times daily. -     predniSONE (DELTASONE) 50 MG tablet; Take one tablet daily for 5 days. -     Ambulatory referral to Orthopedic Surgery -     Ambulatory referral to Home Health  Cervical radiculopathy -     gabapentin (NEURONTIN) 300 MG capsule; Take 1 capsule (300 mg total) by mouth 3 (three) times daily. -     predniSONE (DELTASONE) 50 MG tablet; Take one tablet daily for 5 days. -     Ambulatory referral to Orthopedic Surgery -     Ambulatory referral to [place] about health  Discussed degenerative disc disease and what it is and its chronic nature.  Ordered PT from home.  Referral to orthopedics. Appt Tuesday. Prednisone burst given. Stop mobic while on prednisone and then start mobic back.  Increase gabapentin to 300mg  up to three times a day.  Discussed tens unit, icy hot patches, good supportive pillow and massage.  Follow up as needed or if symptoms worsening.   Follow Up Instructions:    I discussed the assessment and treatment  plan with the patient. The patient was provided an opportunity to ask questions and all were answered. The patient agreed with the plan and demonstrated an understanding of the instructions.   The patient was advised to call back or seek an in-person evaluation if the symptoms worsen or if the condition fails to improve as anticipated.  I provided 30 minutes of non-face-to-face time during this  encounter.   [name], PA-C

## 2021-05-08 ENCOUNTER — Telehealth (INDEPENDENT_AMBULATORY_CARE_PROVIDER_SITE_OTHER): Payer: Medicare HMO | Admitting: Psychiatry

## 2021-05-08 ENCOUNTER — Encounter (HOSPITAL_COMMUNITY): Payer: Self-pay | Admitting: Psychiatry

## 2021-05-08 DIAGNOSIS — F319 Bipolar disorder, unspecified: Secondary | ICD-10-CM

## 2021-05-08 DIAGNOSIS — F419 Anxiety disorder, unspecified: Secondary | ICD-10-CM | POA: Diagnosis not present

## 2021-05-08 MED ORDER — CITALOPRAM HYDROBROMIDE 10 MG PO TABS: ORAL_TABLET | ORAL | 4 refills | Status: DC
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
## 2021-05-09 ENCOUNTER — Telehealth: Payer: Self-pay

## 2021-05-09 NOTE — Telephone Encounter (Signed)
Please let patient know needs an inperson visit for medicare to agree to have home PT.

## 2021-05-09 NOTE — Telephone Encounter (Signed)
Amanda with [facility] called and said they got the referral for Kellie [name]. After reviewing the chart through [place], she said that Medicaid will not accept the most recent visit as being a telephone only encounter, it at least has to have audio & video. I told her that the pt had a OV with Caleen Jobs, FNP on 04/18/21 and that it was an OV and she said that because there was not a specific dx of DDD or cervical radiculopathy Medicare will not accept that encounter as well. Estill Bamberg said in order to proceed with the [facility] that the pt will need to have either a MyChart visit with audio & visual or an OV in office within the next 30 days. Do you have a preference of which visit type?

## 2021-05-10 ENCOUNTER — Telehealth: Payer: Self-pay

## 2021-05-10 DIAGNOSIS — M5412 Radiculopathy, cervical region: Secondary | ICD-10-CM

## 2021-05-10 DIAGNOSIS — M5030 Other cervical disc degeneration, unspecified cervical region: Secondary | ICD-10-CM

## 2021-05-10 NOTE — Telephone Encounter (Signed)
Please see telephone encounter dated 05/10/21

## 2021-05-10 NOTE — Telephone Encounter (Signed)
Kellie [name] with [facility] called and LVM stating that she went out to see pt today for evaluation and start of care. She said they will not be able to take her on as a pt because she is not homebound and is able to ambulate quite well. She said pt is requesting a referral to [facility] on [address].   I called and spoke with pt and she said that she has an appt with Ortho on Monday and wants to see what they say first. She said that she was told that they may need to do an MRI. I told her that if she saw Ortho on Monday and they did recommend that she have PT to see if they will do the referral so that she did not have to come in here for an in-person OV (see telephone encounter dated 05/09/21) in order for insurance to pay for PT. She said she will reach out to [place] on Monday and let us know what Ortho said.

## 2021-05-12 NOTE — Telephone Encounter (Signed)
[place] for PT referral

## 2021-05-15 DIAGNOSIS — M5412 Radiculopathy, cervical region: Secondary | ICD-10-CM | POA: Diagnosis not present

## 2021-05-29 DIAGNOSIS — M4312 Spondylolisthesis, cervical region: Secondary | ICD-10-CM | POA: Diagnosis not present

## 2021-05-29 DIAGNOSIS — M50322 Other cervical disc degeneration at C5-C6 level: Secondary | ICD-10-CM | POA: Diagnosis not present

## 2021-05-29 DIAGNOSIS — M47812 Spondylosis without myelopathy or radiculopathy, cervical region: Secondary | ICD-10-CM | POA: Diagnosis not present

## 2021-05-29 DIAGNOSIS — M4316 Spondylolisthesis, lumbar region: Secondary | ICD-10-CM | POA: Diagnosis not present

## 2021-05-29 DIAGNOSIS — M5412 Radiculopathy, cervical region: Secondary | ICD-10-CM | POA: Diagnosis not present

## 2021-05-29 DIAGNOSIS — M5416 Radiculopathy, lumbar region: Secondary | ICD-10-CM | POA: Diagnosis not present

## 2021-05-29 DIAGNOSIS — M4802 Spinal stenosis, cervical region: Secondary | ICD-10-CM | POA: Diagnosis not present

## 2021-05-30 ENCOUNTER — Encounter: Payer: Self-pay | Admitting: Physician Assistant

## 2021-05-30 DIAGNOSIS — Z111 Encounter for screening for respiratory tuberculosis: Secondary | ICD-10-CM

## 2021-05-30 NOTE — Telephone Encounter (Signed)
Offer the blood test and can fax closer to where she lives.

## 2021-05-31 DIAGNOSIS — Z111 Encounter for screening for respiratory tuberculosis: Secondary | ICD-10-CM | POA: Diagnosis not present

## 2021-06-02 LAB — QUANTIFERON-TB GOLD PLUS
Mitogen-NIL: >10 IU/mL
NIL: 0.03 IU/mL
QuantiFERON-TB Gold Plus: NEGATIVE
TB1-NIL: 0 IU/mL
TB2-NIL: 0 IU/mL

## 2021-06-05 DIAGNOSIS — M545 Low back pain, unspecified: Secondary | ICD-10-CM | POA: Diagnosis not present

## 2021-06-05 DIAGNOSIS — M542 Cervicalgia: Secondary | ICD-10-CM | POA: Diagnosis not present

## 2021-06-05 NOTE — Progress Notes (Signed)
Negative for TB

## 2021-06-14 ENCOUNTER — Encounter: Payer: Self-pay | Admitting: Physician Assistant

## 2021-06-14 ENCOUNTER — Other Ambulatory Visit: Payer: Self-pay | Admitting: Physician Assistant

## 2021-06-14 DIAGNOSIS — R1310 Dysphagia, unspecified: Secondary | ICD-10-CM

## 2021-06-15 ENCOUNTER — Encounter: Payer: Self-pay | Admitting: Physician Assistant

## 2021-06-20 ENCOUNTER — Encounter: Payer: Self-pay | Admitting: Physician Assistant

## 2021-06-20 ENCOUNTER — Other Ambulatory Visit: Payer: Self-pay

## 2021-06-20 ENCOUNTER — Ambulatory Visit (INDEPENDENT_AMBULATORY_CARE_PROVIDER_SITE_OTHER): Payer: Medicare HMO

## 2021-06-20 DIAGNOSIS — E041 Nontoxic single thyroid nodule: Secondary | ICD-10-CM | POA: Insufficient documentation

## 2021-06-20 DIAGNOSIS — R1310 Dysphagia, unspecified: Secondary | ICD-10-CM

## 2021-06-20 NOTE — Progress Notes (Signed)
Left inferior solid thyroid nodule at 1cm meets criteria to recheck with u/s in 1 year.

## 2021-06-21 ENCOUNTER — Encounter: Payer: Self-pay | Admitting: Physician Assistant

## 2021-06-21 NOTE — Telephone Encounter (Signed)
Pt would like to confirm exactly how many thyroid nodules she has and how large does the nodule need to be before it needs to be removed.  Pt states that a reply thru MyChart is acceptable.  Charyl Bigger, CMA

## 2021-06-28 ENCOUNTER — Other Ambulatory Visit: Payer: Self-pay

## 2021-06-28 DIAGNOSIS — M5030 Other cervical disc degeneration, unspecified cervical region: Secondary | ICD-10-CM

## 2021-06-28 DIAGNOSIS — M5412 Radiculopathy, cervical region: Secondary | ICD-10-CM

## 2021-06-28 MED ORDER — GABAPENTIN 300 MG PO CAPS: 300.0000 mg | ORAL_CAPSULE | Freq: Three times a day (TID) | ORAL | 3 refills | Status: DC

## 2021-07-03 ENCOUNTER — Encounter: Payer: Self-pay | Admitting: Physician Assistant

## 2021-07-06 ENCOUNTER — Ambulatory Visit (INDEPENDENT_AMBULATORY_CARE_PROVIDER_SITE_OTHER): Payer: Medicare HMO | Admitting: Physician Assistant

## 2021-07-06 ENCOUNTER — Encounter: Payer: Self-pay | Admitting: Physician Assistant

## 2021-07-06 VITALS — BP 100/58 | HR 79 | Ht 66.0 in | Wt 113.0 lb

## 2021-07-06 DIAGNOSIS — R1314 Dysphagia, pharyngoesophageal phase: Secondary | ICD-10-CM | POA: Diagnosis not present

## 2021-07-06 DIAGNOSIS — K219 Gastro-esophageal reflux disease without esophagitis: Secondary | ICD-10-CM | POA: Diagnosis not present

## 2021-07-06 NOTE — Progress Notes (Signed)
I agree with the above note, plan 
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
## 2021-07-06 NOTE — Patient Instructions (Signed)
Pantoprazole 40 mg twice daily 30-60 minutes before breakfast and dinner.  You have been scheduled for an endoscopy. Please follow written instructions given to you at your visit today. If you use inhalers (even only as needed), please bring them with you on the day of your procedure.  If you are age 67 or older, your body mass index should be between 23-30. Your Body mass index is 18.24 kg/m. If this is out of the aforementioned range listed, please consider follow up with your Primary Care Provider.  If you are age 53 or younger, your body mass index should be between 19-25. Your Body mass index is 18.24 kg/m. If this is out of the aformentioned range listed, please consider follow up with your Primary Care Provider.   ________________________________________________________  The [facility] providers would like to encourage you to use [facility] to communicate with providers for non-urgent requests or questions.  Due to long hold times on the telephone, sending your provider a message by [facility] may be a faster and more efficient way to get a response.  Please allow 48 business hours for a response.  Please remember that this is for non-urgent requests.  _______________________________________________________

## 2021-07-10 ENCOUNTER — Ambulatory Visit (AMBULATORY_SURGERY_CENTER): Payer: Medicare HMO | Admitting: Gastroenterology

## 2021-07-10 ENCOUNTER — Other Ambulatory Visit: Payer: Self-pay

## 2021-07-10 ENCOUNTER — Encounter: Payer: Self-pay | Admitting: Physician Assistant

## 2021-07-10 ENCOUNTER — Encounter: Payer: Self-pay | Admitting: Gastroenterology

## 2021-07-10 VITALS — BP 112/58 | HR 67 | Temp 98.6°F | Resp 18 | Ht 66.0 in | Wt 113.0 lb

## 2021-07-10 DIAGNOSIS — R1310 Dysphagia, unspecified: Secondary | ICD-10-CM | POA: Diagnosis not present

## 2021-07-10 DIAGNOSIS — K219 Gastro-esophageal reflux disease without esophagitis: Secondary | ICD-10-CM | POA: Diagnosis not present

## 2021-07-10 DIAGNOSIS — K2100 Gastro-esophageal reflux disease with esophagitis, without bleeding: Secondary | ICD-10-CM | POA: Diagnosis not present

## 2021-07-10 MED ORDER — PANTOPRAZOLE SODIUM 40 MG PO TBEC: DELAYED_RELEASE_TABLET | ORAL | 5 refills | Status: DC

## 2021-07-10 MED ORDER — SODIUM CHLORIDE 0.9 % IV SOLN: 500.0000 mL | Freq: Once | INTRAVENOUS | Status: DC

## 2021-07-10 NOTE — Patient Instructions (Signed)
°  Awaiting pathology from Dr. Ardis Hughs  YOU HAD AN ENDOSCOPIC PROCEDURE TODAY AT THE [facility]:   Refer to the procedure report that was given to you for any specific questions about what was found during the examination.  If the procedure report does not answer your questions, please call your gastroenterologist to clarify.  If you requested that your care partner not be given the details of your procedure findings, then the procedure report has been included in a sealed envelope for you to review at your convenience later.  YOU SHOULD EXPECT: Some feelings of bloating in the abdomen. Passage of more gas than usual.  Walking can help get rid of the air that was put into your GI tract during the procedure and reduce the bloating. If you had a lower endoscopy (such as a colonoscopy or flexible sigmoidoscopy) you may notice spotting of blood in your stool or on the toilet paper. If you underwent a bowel prep for your procedure, you may not have a normal bowel movement for a few days.  Please Note:  You might notice some irritation and congestion in your nose or some drainage.  This is from the oxygen used during your procedure.  There is no need for concern and it should clear up in a day or so.  SYMPTOMS TO REPORT IMMEDIATELY:   Following upper endoscopy (EGD)  Vomiting of blood or coffee ground material  New chest pain or pain under the shoulder blades  Painful or persistently difficult swallowing  New shortness of breath  Fever of 100F or higher  Black, tarry-looking stools  For urgent or emergent issues, a gastroenterologist can be reached at any hour by calling [phone]. Do not use MyChart messaging for urgent concerns.    DIET:  We do recommend a small meal at first, but then you may proceed to your regular diet.  Drink plenty of fluids but you should avoid alcoholic beverages for 24 hours.  ACTIVITY:  You should plan to take it easy for the rest of today and you  should NOT DRIVE or use heavy machinery until tomorrow (because of the sedation medicines used during the test).    FOLLOW UP: Our staff will call the number listed on your records 48-72 hours following your procedure to check on you and address any questions or concerns that you may have regarding the information given to you following your procedure. If we do not reach you, we will leave a message.  We will attempt to reach you two times.  During this call, we will ask if you have developed any symptoms of COVID 19. If you develop any symptoms (ie: fever, flu-like symptoms, shortness of breath, cough etc.) before then, please call [phone].  If you test positive for Covid 19 in the 2 weeks post procedure, please call and report this information to [place].    If any biopsies were taken you will be contacted by phone or by letter within the next 1-3 weeks.  Please call us at [phone] if you have not heard about the biopsies in 3 weeks.    SIGNATURES/CONFIDENTIALITY: You and/or your care partner have signed paperwork which will be entered into your electronic medical record.  These signatures attest to the fact that that the information above on your After Visit Summary has been reviewed and is understood.  Full responsibility of the confidentiality of this discharge information lies with you and/or your care-partner.

## 2021-07-10 NOTE — Progress Notes (Signed)
Called to room to assist during endoscopic procedure.  Patient ID and intended procedure confirmed with present staff. Received instructions for my participation in the procedure from the performing physician.  

## 2021-07-10 NOTE — Progress Notes (Signed)
°  The recent H&P (dated 07/06/2021) was reviewed, the patient was examined and there is no change in the patients condition since that H&P was completed.   Kellie [name]  07/10/2021, 9:45 AM

## 2021-07-10 NOTE — Op Note (Signed)
[facility] Patient Name: Kellie [name] Procedure Date: 07/10/2021 9:45 AM MRN: [id number] Endoscopist: Milus Banister , MD Age: 66 Referring MD:  Date of Birth: January 09, 1956 Gender: Female Account #: [id number] Procedure:                Upper GI endoscopy Indications:              Dysphagia, Heartburn Medicines:                Monitored Anesthesia Care Procedure:                Pre-Anesthesia Assessment:                           - Prior to the procedure, a History and Physical                            was performed, and patient medications and                            allergies were reviewed. The patient's tolerance of                            previous anesthesia was also reviewed. The risks                            and benefits of the procedure and the sedation                            options and risks were discussed with the patient.                            All questions were answered, and informed consent                            was obtained. Prior Anticoagulants: The patient has                            taken no previous anticoagulant or antiplatelet                            agents. ASA Grade Assessment: II - A patient with                            mild systemic disease. After reviewing the risks                            and benefits, the patient was deemed in                            satisfactory condition to undergo the procedure.                           After obtaining informed consent, the endoscope was  passed under direct vision. Throughout the                            procedure, the patient's blood pressure, pulse, and                            oxygen saturations were monitored continuously. The                            [place] #[id number] was introduced through                            the mouth, and advanced to the second part of                            duodenum. The upper GI endoscopy was  accomplished                            without difficulty. The patient tolerated the                            procedure well. Scope In: Scope Out: Findings:                 Biopsies were taken from normal appearing esopahgus                            distally (jar 1) and proximally (jar 2)                           The esophagus was normal.                           The stomach was normal.                           The examined duodenum was normal. Complications:            No immediate complications. Estimated blood loss:                            None. Estimated Blood Loss:     Estimated blood loss: none. Impression:               - Normal UGI tract.                           - Biopsies taken from distal and proximal esophagus                            to check for Eosinophilic Esophagitis Recommendation:           - Patient has a contact number available for                            emergencies. The signs and symptoms of potential  delayed complications were discussed with the                            patient. Return to normal activities tomorrow.                            Written discharge instructions were provided to the                            patient.                           - Resume previous diet.                           - Continue present medications. Please keep taking                            the protonix 40mg  pills; one pill shortly before                            breakfast and one pill shortly before dinner.                           - Await pathology results. Milus Banister, MD 07/10/2021 10:00:51 AM This report has been signed electronically.

## 2021-07-10 NOTE — Progress Notes (Signed)
Pt in recovery with monitors in place, VSS. Report given to receiving RN. Bite guard was placed with pt awake to ensure comfort. No dental or soft tissue damage noted. 

## 2021-07-12 ENCOUNTER — Telehealth: Payer: Self-pay | Admitting: *Deleted

## 2021-07-12 NOTE — Telephone Encounter (Signed)
°  Follow up Call-  Call back number 07/10/2021  Post procedure Call Back phone  # [phone]  Permission to leave phone message Yes  Some recent data might be hidden    Patient questions:  Do you have a fever, pain , or abdominal swelling? No. Pain Score  0 *  Have you tolerated food without any problems? Yes.    Have you been able to return to your normal activities? Yes.    Do you have any questions about your discharge instructions: Diet   No. Medications  No. Follow up visit  Pt asked about pathology and biopsies- all questions answered  Do you have questions or concerns about your Care? No.  Actions: * If pain score is 4 or above: No action needed, pain <4.  Pt c/o discomfort on Tuesday morning- none today.  Have you developed a fever since your procedure? no  2.   Have you had an respiratory symptoms (SOB or cough) since your procedure? no  3.   Have you tested positive for COVID 19 since your procedure no  4.   Have you had any family members/close contacts diagnosed with the COVID 19 since your procedure?  no   If yes to any of these questions please route to Joylene John, RN and Joella Prince, RN

## 2021-07-14 ENCOUNTER — Encounter: Payer: Self-pay | Admitting: Gastroenterology

## 2021-07-26 ENCOUNTER — Encounter: Payer: Self-pay | Admitting: Physician Assistant

## 2021-07-26 MED ORDER — ONETOUCH ULTRA VI STRP: ORAL_STRIP | 12 refills | Status: DC

## 2021-07-26 MED ORDER — ONETOUCH ULTRA 2 W/DEVICE KIT
PACK | 99 refills | Status: DC
Start: 2021-07-26 — End: 2022-03-16

## 2021-07-26 MED ORDER — LANCETS 30G MISC: 99 refills | Status: DC

## 2021-07-26 NOTE — Telephone Encounter (Signed)
See other MyChart message

## 2021-07-31 ENCOUNTER — Other Ambulatory Visit: Payer: Self-pay | Admitting: Neurology

## 2021-07-31 ENCOUNTER — Encounter: Payer: Self-pay | Admitting: Physician Assistant

## 2021-07-31 DIAGNOSIS — L853 Xerosis cutis: Secondary | ICD-10-CM

## 2021-07-31 DIAGNOSIS — F418 Other specified anxiety disorders: Secondary | ICD-10-CM

## 2021-07-31 DIAGNOSIS — F419 Anxiety disorder, unspecified: Secondary | ICD-10-CM

## 2021-07-31 DIAGNOSIS — H538 Other visual disturbances: Secondary | ICD-10-CM

## 2021-07-31 DIAGNOSIS — A6004 Herpesviral vulvovaginitis: Secondary | ICD-10-CM

## 2021-07-31 DIAGNOSIS — R5383 Other fatigue: Secondary | ICD-10-CM

## 2021-07-31 DIAGNOSIS — L659 Nonscarring hair loss, unspecified: Secondary | ICD-10-CM

## 2021-07-31 DIAGNOSIS — R634 Abnormal weight loss: Secondary | ICD-10-CM

## 2021-07-31 MED ORDER — ALPRAZOLAM 0.5 MG PO TABS: ORAL_TABLET | ORAL | 3 refills | Status: DC

## 2021-07-31 MED ORDER — ACYCLOVIR 200 MG PO CAPS: 400.0000 mg | ORAL_CAPSULE | Freq: Two times a day (BID) | ORAL | 0 refills | Status: DC

## 2021-07-31 NOTE — Telephone Encounter (Signed)
Received call from [place]. Patient is new to this pharmacy. They are requesting refills of Acyclovir and Xanax. Acyclovir sent.  Xanax last written 03/29/2022 #30 with 3 refills Last appt 05/02/2021

## 2021-08-01 ENCOUNTER — Telehealth (HOSPITAL_COMMUNITY): Payer: Self-pay | Admitting: Psychiatry

## 2021-08-01 DIAGNOSIS — M5412 Radiculopathy, cervical region: Secondary | ICD-10-CM

## 2021-08-01 DIAGNOSIS — M5030 Other cervical disc degeneration, unspecified cervical region: Secondary | ICD-10-CM

## 2021-08-01 MED ORDER — GABAPENTIN 100 MG PO CAPS: 100.0000 mg | ORAL_CAPSULE | Freq: Three times a day (TID) | ORAL | 2 refills | Status: DC

## 2021-08-01 NOTE — Telephone Encounter (Signed)
Spoke with patient and fixed rx in the chart and resent to the pharmacy for 100mg  take 1 TID. Patient was satisfied with this, says 300mg  was too much medication. Nothing further is needed at this time

## 2021-08-01 NOTE — Telephone Encounter (Signed)
Changed pharmacy will no longer use the other    [address]  [place], [place] [address]  [phone]     RX refill: gabapentin 100mg  - 3 times a day    Please call pt to verify she states that is what she takes but on my end I only see gabapentin (NEURONTIN) 300 MG capsule

## 2021-08-03 NOTE — Telephone Encounter (Signed)
Referral placed.

## 2021-08-23 ENCOUNTER — Telehealth (INDEPENDENT_AMBULATORY_CARE_PROVIDER_SITE_OTHER): Payer: Medicare HMO | Admitting: Physician Assistant

## 2021-08-23 ENCOUNTER — Telehealth: Payer: Self-pay | Admitting: Gastroenterology

## 2021-08-23 DIAGNOSIS — K2100 Gastro-esophageal reflux disease with esophagitis, without bleeding: Secondary | ICD-10-CM

## 2021-08-23 DIAGNOSIS — L853 Xerosis cutis: Secondary | ICD-10-CM | POA: Diagnosis not present

## 2021-08-23 DIAGNOSIS — R5381 Other malaise: Secondary | ICD-10-CM

## 2021-08-23 DIAGNOSIS — R1310 Dysphagia, unspecified: Secondary | ICD-10-CM | POA: Diagnosis not present

## 2021-08-23 DIAGNOSIS — L659 Nonscarring hair loss, unspecified: Secondary | ICD-10-CM | POA: Diagnosis not present

## 2021-08-23 DIAGNOSIS — R636 Underweight: Secondary | ICD-10-CM

## 2021-08-23 DIAGNOSIS — R5383 Other fatigue: Secondary | ICD-10-CM

## 2021-08-23 NOTE — Progress Notes (Deleted)
? ?  Subjective:  ? ? Patient ID: Kellie [name], female    DOB: Aug 28, 1955, 66 y.o.   MRN: [id number] ? ?HPI ?Endoscopy in January bad GERD off caffien off chocolate ? ?Weak in body vision  ?Sleep  ?Increase appepite ?Hips ?Exercise every.  ? ?Write down  ? ?Ardis Hughs protonix twice a day.  ? ?ENT-  ?Choked on food. "Feels like a stick"  done swallow test normal.  ?Legs different colors and ?Echo done ? ? quest TSH corsil  ?ESR/ ?Review of Systems ? ?   ?Objective:  ? Physical Exam ? ? ? ? ?   ?Assessment & Plan:  ? ? ?

## 2021-08-23 NOTE — Progress Notes (Signed)
Discuss endocrinology refusal for referral ?

## 2021-08-23 NOTE — Telephone Encounter (Signed)
The pt states that she has hoarseness and cough for the past couple of weeks.  She is taking protonix as prescribed. She had EGD in January of this year see below; ? ?Biopsies were taken from normal appearing esopahgus distally (jar 1) and proximally (jar 2) ?- The esophagus was normal. ?- The stomach was normal. ?- The examined duodenum was normal. ? ?She tells me that she has an appt with ENT next week.  I offered to make her an appt but she declined and states she will see that the ENT advises.  She will call back if she needs GI followup ?

## 2021-08-23 NOTE — Telephone Encounter (Signed)
Inbound call from patient stating that she is having gas, chest pain, choking when eating and coughing and is seeking advise as to what she needs to do. Patient stated that she has an appointment on 3/21 with an ENT to see if they can help as well. Please advise.   ?

## 2021-08-24 DIAGNOSIS — E559 Vitamin D deficiency, unspecified: Secondary | ICD-10-CM | POA: Diagnosis not present

## 2021-08-24 DIAGNOSIS — L853 Xerosis cutis: Secondary | ICD-10-CM | POA: Diagnosis not present

## 2021-08-24 DIAGNOSIS — R5381 Other malaise: Secondary | ICD-10-CM | POA: Diagnosis not present

## 2021-08-24 DIAGNOSIS — R1310 Dysphagia, unspecified: Secondary | ICD-10-CM | POA: Diagnosis not present

## 2021-08-24 DIAGNOSIS — R636 Underweight: Secondary | ICD-10-CM | POA: Diagnosis not present

## 2021-08-24 DIAGNOSIS — R5383 Other fatigue: Secondary | ICD-10-CM | POA: Diagnosis not present

## 2021-08-24 DIAGNOSIS — L659 Nonscarring hair loss, unspecified: Secondary | ICD-10-CM | POA: Diagnosis not present

## 2021-08-24 DIAGNOSIS — D511 Vitamin B12 deficiency anemia due to selective vitamin B12 malabsorption with proteinuria: Secondary | ICD-10-CM | POA: Diagnosis not present

## 2021-08-24 DIAGNOSIS — K2100 Gastro-esophageal reflux disease with esophagitis, without bleeding: Secondary | ICD-10-CM | POA: Diagnosis not present

## 2021-08-25 ENCOUNTER — Encounter: Payer: Self-pay | Admitting: Physician Assistant

## 2021-08-25 DIAGNOSIS — R748 Abnormal levels of other serum enzymes: Secondary | ICD-10-CM | POA: Insufficient documentation

## 2021-08-25 DIAGNOSIS — R7989 Other specified abnormal findings of blood chemistry: Secondary | ICD-10-CM | POA: Insufficient documentation

## 2021-08-25 NOTE — Progress Notes (Signed)
Iron looks good.  ?B12 looks good.  ?Vitamin D looks good.  ?Normal CRP(inflammation).  ?Normal WBC.  ?Normal kidney and liver function.  ?Normal glucose.  ?TSH is up trending up some to indicate moving toward HYPO thyroidism. Lets talk about treatment next week and what that would look like.  ?Your lipase is elevated. This is a pancreatic enzyme to indicate pancreas is inflamed. Will treat as mild pancreatitis. We want to the pancreas to rest so avoid meats and fatty foods and will recheck next week. Eat lots of grains, fruits, vegetables.

## 2021-08-30 ENCOUNTER — Other Ambulatory Visit: Payer: Self-pay

## 2021-08-30 ENCOUNTER — Encounter: Payer: Self-pay | Admitting: Physician Assistant

## 2021-08-30 ENCOUNTER — Ambulatory Visit (INDEPENDENT_AMBULATORY_CARE_PROVIDER_SITE_OTHER): Payer: Medicare HMO | Admitting: Physician Assistant

## 2021-08-30 VITALS — BP 115/57 | HR 86 | Ht 66.0 in | Wt 113.0 lb

## 2021-08-30 DIAGNOSIS — R748 Abnormal levels of other serum enzymes: Secondary | ICD-10-CM

## 2021-08-30 DIAGNOSIS — R7989 Other specified abnormal findings of blood chemistry: Secondary | ICD-10-CM

## 2021-08-30 DIAGNOSIS — R112 Nausea with vomiting, unspecified: Secondary | ICD-10-CM | POA: Diagnosis not present

## 2021-08-30 DIAGNOSIS — R1010 Upper abdominal pain, unspecified: Secondary | ICD-10-CM

## 2021-08-30 NOTE — Progress Notes (Unsigned)
° °  Subjective:    Patient ID: Kellie [name], female    DOB: 1955/10/26, 66 y.o.   MRN: [id number]  HPI Endoscopy Dr. Ardis Hughs  Hx of diverticulitis Comes and goes upper abdomen No vomiting Stools loose and gray  Gallbladder   8 weeks recheck thyroid    Review of Systems     Objective:   Physical Exam        Assessment & Plan:

## 2021-08-30 NOTE — Patient Instructions (Signed)
Roasted seaweed ?Seaweed, such as kelp, nori, and wakame, are naturally rich in iodine--a trace element needed for normal thyroid function. Eat seaweed with sushi or get packaged seaweed snacks to toss in salads. ?Salted nuts ?[place] nuts, macadamia nuts, and hazelnuts are excellent sources of selenium, which helps support healthy thyroid function. Pack a small bag of assorted nuts to snack on throughout the day. ?Baked fish ?Fish is rich in Omega-3 fatty acids and selenium, which both help decrease inflammation. Bake salmon, cod, sea bass, haddock, or perch for lunch or dinner to get a healthy dose of Omega-3s and selenium. ?Dairy ?Dairy products like yogurt, ice cream, and milk contain iodine. The thyroid needs iodine to prevent its glands from becoming enlarged--known as goiter. Treat yourself to a low-fat serving of frozen yogurt to get sufficient levels of iodine. ?Fresh eggs ?Eggs contain healthy amounts of both selenium and iodine. For the most health benefits, eat the whole egg, as the yolk holds most of the nutrients. ?

## 2021-09-01 LAB — CBC WITH DIFFERENTIAL/PLATELET
Absolute Monocytes: 360 cells/uL (ref 200–950)
Basophils Absolute: 70 cells/uL (ref 0–200)
Basophils Relative: 1.4 %
Eosinophils Absolute: 50 cells/uL (ref 15–500)
Eosinophils Relative: 1 %
HCT: 37.7 % (ref 35.0–45.0)
Hemoglobin: 12.5 g/dL (ref 11.7–15.5)
Lymphs Abs: 2450 cells/uL (ref 850–3900)
MCH: 30.9 pg (ref 27.0–33.0)
MCHC: 33.2 g/dL (ref 32.0–36.0)
MCV: 93.3 fL (ref 80.0–100.0)
MPV: 11.2 fL (ref 7.5–12.5)
Monocytes Relative: 7.2 %
Neutro Abs: 2070 cells/uL (ref 1500–7800)
Neutrophils Relative %: 41.4 %
Platelets: 313 10*3/uL (ref 140–400)
RBC: 4.04 10*6/uL (ref 3.80–5.10)
RDW: 11.8 % (ref 11.0–15.0)
Total Lymphocyte: 49 %
WBC: 5 10*3/uL (ref 3.8–10.8)

## 2021-09-01 LAB — IRON,TIBC AND FERRITIN PANEL
%SAT: 27 % (calc) (ref 16–45)
Ferritin: 41 ng/mL (ref 16–288)
Iron: 77 ug/dL (ref 45–160)
TIBC: 282 mcg/dL (calc) (ref 250–450)

## 2021-09-01 LAB — COMPLETE METABOLIC PANEL WITH GFR
AG Ratio: 2.4 (calc) (ref 1.0–2.5)
ALT: 18 U/L (ref 6–29)
AST: 19 U/L (ref 10–35)
Albumin: 4.4 g/dL (ref 3.6–5.1)
Alkaline phosphatase (APISO): 42 U/L (ref 37–153)
BUN: 20 mg/dL (ref 7–25)
CO2: 29 mmol/L (ref 20–32)
Calcium: 9.8 mg/dL (ref 8.6–10.4)
Chloride: 103 mmol/L (ref 98–110)
Creat: 0.89 mg/dL (ref 0.50–1.05)
Globulin: 1.8 g/dL (calc) — ABNORMAL LOW (ref 1.9–3.7)
Glucose, Bld: 83 mg/dL (ref 65–139)
Potassium: 4.2 mmol/L (ref 3.5–5.3)
Sodium: 142 mmol/L (ref 135–146)
Total Bilirubin: 0.4 mg/dL (ref 0.2–1.2)
Total Protein: 6.2 g/dL (ref 6.1–8.1)
eGFR: 71 mL/min/{1.73_m2} (ref >=60)

## 2021-09-01 LAB — THYROID PANEL WITH TSH
Free Thyroxine Index: 3 (ref 1.4–3.8)
T3 Uptake: 32 % (ref 22–35)
T4, Total: 9.4 ug/dL (ref 5.1–11.9)
TSH: 5.69 mIU/L — ABNORMAL HIGH (ref 0.40–4.50)

## 2021-09-01 LAB — SEDIMENTATION RATE: Sed Rate: 6 mm/h (ref 0–30)

## 2021-09-01 LAB — ANA SCREEN,IFA,REFLEX TITER/PATTERN,REFLEX MPLX 11 AB CASCADE
14-3-3 eta Protein: <0.2 ng/mL (ref <0.2)
Anti Nuclear Antibody (ANA): NEGATIVE
Cyclic Citrullin Peptide Ab: <16 UNITS
Rheumatoid fact SerPl-aCnc: <14 IU/mL (ref <14)

## 2021-09-01 LAB — CORTISOL: Cortisol, Plasma: 13 ug/dL

## 2021-09-01 LAB — THYROID PEROXIDASE ANTIBODY: Thyroperoxidase Ab SerPl-aCnc: <1 IU/mL (ref <9)

## 2021-09-01 LAB — LIPASE: Lipase: 99 U/L — ABNORMAL HIGH (ref 7–60)

## 2021-09-01 LAB — VITAMIN D 25 HYDROXY (VIT D DEFICIENCY, FRACTURES): Vit D, 25-Hydroxy: 45 ng/mL (ref 30–100)

## 2021-09-01 LAB — B12 AND FOLATE PANEL
Folate: 20.5 ng/mL
Vitamin B-12: 500 pg/mL (ref 200–1100)

## 2021-09-01 LAB — C-REACTIVE PROTEIN: CRP: 0.5 mg/L (ref <8.0)

## 2021-09-01 LAB — ACTH: C206 ACTH: 39 pg/mL (ref 6–50)

## 2021-09-04 ENCOUNTER — Encounter: Payer: Self-pay | Admitting: Physician Assistant

## 2021-09-04 ENCOUNTER — Ambulatory Visit (INDEPENDENT_AMBULATORY_CARE_PROVIDER_SITE_OTHER): Payer: Medicare HMO

## 2021-09-04 ENCOUNTER — Other Ambulatory Visit: Payer: Self-pay

## 2021-09-04 DIAGNOSIS — R748 Abnormal levels of other serum enzymes: Secondary | ICD-10-CM

## 2021-09-04 DIAGNOSIS — R112 Nausea with vomiting, unspecified: Secondary | ICD-10-CM | POA: Diagnosis not present

## 2021-09-04 DIAGNOSIS — R1013 Epigastric pain: Secondary | ICD-10-CM | POA: Diagnosis not present

## 2021-09-04 DIAGNOSIS — K5730 Diverticulosis of large intestine without perforation or abscess without bleeding: Secondary | ICD-10-CM | POA: Diagnosis not present

## 2021-09-04 MED ORDER — IOHEXOL 300 MG/ML  SOLN
100.0000 mL | Freq: Once | INTRAMUSCULAR | Status: AC | PRN
  Administered 2021-09-04: 100 mL via INTRAVENOUS

## 2021-09-04 NOTE — Progress Notes (Signed)
..Virtual Visit via Telephone Note  I connected with Kellie [name] on 09/04/21 at  1:20 PM EST by telephone and verified that I am speaking with the correct person using two identifiers.  Location: Patient: home Provider: clinic  .[name] KitchenParticipating in visit:  Patient: Kellie [name]  Provider: [name] PA-C     I discussed the limitations, risks, security and privacy concerns of performing an evaluation and management service by telephone and the availability of in person appointments. I also discussed with the patient that there may be a patient responsible charge related to this service. The patient expressed understanding and agreed to proceed.   History of Present Illness: Pt is a 66 yo female who was denied referral for endocrinology and would like to discuss.   She continues to have hair loss, fatigue, dysphagia, GERD, unintentional weight loss.   .. Active Ambulatory Problems    Diagnosis Date Noted   GERD (gastroesophageal reflux disease) 09/27/2010   Allergic rhinitis 11/08/2010   Insomnia 11/08/2010   Cough 06/02/2011   Osteoporosis 11/21/2011   Borderline intellectual functioning 02/15/2012   Genital herpes 11/05/2012   Hemorrhoids 11/05/2012   Chronic periodontal disease 06/30/2013   Adhesive capsulitis of right shoulder 11/24/2013   Encounter for gynecological examination with Papanicolaou smear of cervix 12/08/2013   Prediabetes 12/14/2013   Postmenopausal atrophic vaginitis 03/17/2014   Bipolar disorder with depression ([place]) 06/02/2014   Family history of renal cancer 10/11/2014   DDD (degenerative disc disease), lumbar 10/11/2014   Rhinitis, allergic 10/14/2014   Near syncope 02/09/2015   Chronic nasal congestion 03/01/2015   Vitreous floaters of right eye 03/01/2015   DDD (degenerative disc disease), cervical 07/05/2015   Cough, persistent 07/21/2015   Post-nasal drip 07/21/2015   Left knee pain 11/09/2015   Chronic pain of both knees 11/09/2015   Plantar  fasciitis, bilateral 11/09/2015   Hoarseness 03/09/2016   Pulmonary nodule 08/08/2016   Bipolar 1 disorder with moderate mania ([place]) 09/17/2016   Panic attacks 09/18/2016   SOB (shortness of breath) 09/18/2016   Weakness 10/05/2016   Family history of pancreatic cancer 02/18/2017   Diarrhea 02/18/2017   Loss of weight 02/18/2017   Unintentional weight loss 05/18/2017   Elevated LDL cholesterol level 05/18/2017   Hyperlipidemia 05/18/2017   Irritable bowel syndrome with both constipation and diarrhea 05/18/2017   History of loop electrical excision procedure (LEEP) 07/01/2017   Atypical chest pain 10/22/2017   Anxiety 10/22/2017   Aortic insufficiency 01/10/2018   Non-restorative sleep 01/10/2018   Snoring 01/10/2018   Dermatochalasis of both upper eyelids 03/13/2018   Periodic limb movement 03/13/2018   Nocturnal leg cramps 03/13/2018   Vaginal dryness 09/02/2018   History of diverticulitis 09/16/2018   Chronic pain of both ankles 01/15/2019   Cervical stenosis (uterine cervix) 02/13/2019   Vitamin D insufficiency 02/16/2019   Plantar fasciitis, right 04/02/2019   Reactive airway disease without asthma 04/17/2019   Cortical age-related cataract of both eyes 12/22/2018   Pinguecula of both eyes 12/22/2018   Nuclear sclerotic cataract of both eyes 12/22/2018   Anterolisthesis 07/28/2019   Vitamin D deficiency 12/02/2019   No energy 12/02/2019   Rectal bleeding 12/02/2019   Dysphagia 12/02/2019   Tenderness of neck 12/02/2019   Hyperinflation of lungs 01/15/2020   History of iron deficiency 02/15/2020   Globin abnormality ([place]) 02/19/2020   Hiatal hernia 02/19/2020   Gastroesophageal reflux disease with esophagitis 02/19/2020   Belching 03/23/2020   Leukopenia 06/21/2020   Low serum calcium 06/21/2020  Breast pain, right 07/11/2020   Nonrheumatic aortic valve insufficiency 09/20/2020   Bilateral leg pain 09/20/2020   Numbness and tingling of both feet 09/20/2020    Family history of stroke 09/20/2020   Ankle edema, bilateral 09/20/2020   Anxiety about health 09/20/2020   Cervical radiculopathy 05/03/2021   Thyroid nodule 06/20/2021   Elevated lipase 08/25/2021   Elevated TSH 08/25/2021   Resolved Ambulatory Problems    Diagnosis Date Noted   Sinusitis 09/27/2010   Depression 09/27/2010   Bronchitis 12/04/2010   General medical examination 10/18/2011   Dizziness 11/21/2011   Knee pain 11/21/2011   Low back pain 11/21/2011   Heel pain 07/14/2012   Thoracic myofascial strain 03/13/2013   Sinusitis 04/20/2013   Cough 04/22/2013   Rotator cuff disorder 08/01/2013   Right shoulder pain 10/27/2013   Adhesive capsulitis 11/02/2013   Pain in joint, shoulder region 11/24/2013   Bipolar disorder ([place]) 04/29/2014   Right serous otitis media 08/03/2014   Acute recurrent maxillary sinusitis 10/14/2014   Right-sided low back pain without sciatica 10/14/2014   Sinusitis 02/09/2015   Sacroiliac joint dysfunction of right side 02/25/2015   Piriformis syndrome 03/01/2015   Adhesive capsulitis 07/05/2015   Radiculitis of right cervical region 07/06/2015   Drug reaction 09/25/2016   Mitral valve prolapse 01/10/2018   Past Medical History:  Diagnosis Date   Abnormal Pap smear of cervix    Allergic rhinitis 11/08/2010   Asthma    Herpes simplex of female genitalia    Urticaria         Observations/Objective: No acute distress Normal mood Normal breathing  .[name] KitchenThere were no vitals filed for this visit. There is no height or weight on file to calculate BMI.     Assessment and Plan: [name] KitchenMarland KitchenCheryll was seen today for follow-up.  Diagnoses and all orders for this visit:  Underweight -     COMPLETE METABOLIC PANEL WITH GFR -     Cortisol -     ACTH -     Thyroid Panel With TSH -     Thyroid peroxidase antibody -     CBC with Differential/Platelet -     Sed Rate (ESR) -     Fe+TIBC+Fer -     B12 and Folate Panel -     VITAMIN D 25 Hydroxy  (Vit-D Deficiency, Fractures) -     C-reactive protein -     ANA Screen,IFA,Reflex Titer/Pattern,Reflex Mplx 11 Ab Cascade with IdentRA -     Lipase  Gastroesophageal reflux disease with esophagitis without hemorrhage -     COMPLETE METABOLIC PANEL WITH GFR -     Cortisol -     ACTH -     Thyroid Panel With TSH -     Thyroid peroxidase antibody -     CBC with Differential/Platelet -     Sed Rate (ESR) -     Fe+TIBC+Fer -     B12 and Folate Panel -     VITAMIN D 25 Hydroxy (Vit-D Deficiency, Fractures) -     C-reactive protein -     ANA Screen,IFA,Reflex Titer/Pattern,Reflex Mplx 11 Ab Cascade with IdentRA -     Lipase  Hair loss -     COMPLETE METABOLIC PANEL WITH GFR -     Cortisol -     ACTH -     Thyroid Panel With TSH -     Thyroid peroxidase antibody -     CBC with Differential/Platelet -  Sed Rate (ESR) -     Fe+TIBC+Fer -     B12 and Folate Panel -     VITAMIN D 25 Hydroxy (Vit-D Deficiency, Fractures) -     C-reactive protein -     ANA Screen,IFA,Reflex Titer/Pattern,Reflex Mplx 11 Ab Cascade with IdentRA -     Lipase  Malaise and fatigue -     COMPLETE METABOLIC PANEL WITH GFR -     Cortisol -     ACTH -     Thyroid Panel With TSH -     Thyroid peroxidase antibody -     CBC with Differential/Platelet -     Sed Rate (ESR) -     Fe+TIBC+Fer -     B12 and Folate Panel -     VITAMIN D 25 Hydroxy (Vit-D Deficiency, Fractures) -     C-reactive protein -     ANA Screen,IFA,Reflex Titer/Pattern,Reflex Mplx 11 Ab Cascade with IdentRA -     Lipase  Dysphagia, unspecified type -     COMPLETE METABOLIC PANEL WITH GFR -     Cortisol -     ACTH -     Thyroid Panel With TSH -     Thyroid peroxidase antibody -     CBC with Differential/Platelet -     Sed Rate (ESR) -     Fe+TIBC+Fer -     B12 and Folate Panel -     VITAMIN D 25 Hydroxy (Vit-D Deficiency, Fractures) -     C-reactive protein -     ANA Screen,IFA,Reflex Titer/Pattern,Reflex Mplx 11 Ab Cascade  with IdentRA -     Lipase  Dry skin -     COMPLETE METABOLIC PANEL WITH GFR -     Cortisol -     ACTH -     Thyroid Panel With TSH -     Thyroid peroxidase antibody -     CBC with Differential/Platelet -     Sed Rate (ESR) -     Fe+TIBC+Fer -     B12 and Folate Panel -     VITAMIN D 25 Hydroxy (Vit-D Deficiency, Fractures) -     C-reactive protein -     ANA Screen,IFA,Reflex Titer/Pattern,Reflex Mplx 11 Ab Cascade with IdentRA -     Lipase   Follow up in office in one week to go over labs and past work up to determine next steps. Discussed conversation with endocrinologist and that he does not think there is a need for consult.     Follow Up Instructions:    I discussed the assessment and treatment plan with the patient. The patient was provided an opportunity to ask questions and all were answered. The patient agreed with the plan and demonstrated an understanding of the instructions.   The patient was advised to call back or seek an in-person evaluation if the symptoms worsen or if the condition fails to improve as anticipated.  I provided 10 minutes of non-face-to-face time during this encounter.   [name], PA-C

## 2021-09-06 ENCOUNTER — Encounter: Payer: Self-pay | Admitting: Physician Assistant

## 2021-09-06 NOTE — Progress Notes (Signed)
Gallbladder looks great.  ?Pancreas looks great.  ?Spleen normal size. ?Adrenal gland and bladder unremarkable.  ?Diverticulosis BUT no evidence of inflammation or infection.  ?No acute findings in abdomen.  ?We can recheck your lipase to make sure trending down and you can follow up with GI for any digestion issues but no reason for the pancreas inflammation found on CT.

## 2021-09-08 ENCOUNTER — Encounter: Payer: Self-pay | Admitting: Physician Assistant

## 2021-09-08 DIAGNOSIS — R112 Nausea with vomiting, unspecified: Secondary | ICD-10-CM | POA: Insufficient documentation

## 2021-09-08 DIAGNOSIS — R1010 Upper abdominal pain, unspecified: Secondary | ICD-10-CM | POA: Insufficient documentation

## 2021-09-11 DIAGNOSIS — R1010 Upper abdominal pain, unspecified: Secondary | ICD-10-CM | POA: Diagnosis not present

## 2021-09-11 DIAGNOSIS — R748 Abnormal levels of other serum enzymes: Secondary | ICD-10-CM | POA: Diagnosis not present

## 2021-09-11 DIAGNOSIS — R112 Nausea with vomiting, unspecified: Secondary | ICD-10-CM | POA: Diagnosis not present

## 2021-09-12 ENCOUNTER — Encounter: Payer: Self-pay | Admitting: Physician Assistant

## 2021-09-12 DIAGNOSIS — K219 Gastro-esophageal reflux disease without esophagitis: Secondary | ICD-10-CM | POA: Diagnosis not present

## 2021-09-12 DIAGNOSIS — R1312 Dysphagia, oropharyngeal phase: Secondary | ICD-10-CM | POA: Diagnosis not present

## 2021-09-12 NOTE — Progress Notes (Signed)
Lipase is back down to normal. ?Amylase is normal.  ?

## 2021-09-13 ENCOUNTER — Encounter: Payer: Self-pay | Admitting: Physician Assistant

## 2021-09-13 LAB — HEAVY METALS PANEL, BLOOD
Arsenic: <3 mcg/L (ref <23)
Lead: 1 ug/dL (ref <3.5)
Mercury, B: <4 mcg/L (ref <=10)

## 2021-09-13 LAB — IODINE, SERUM/PLASMA: Iodine: 136 mcg/L — ABNORMAL HIGH (ref 52–109)

## 2021-09-13 LAB — LIPASE: Lipase: 43 U/L (ref 7–60)

## 2021-09-13 LAB — AMYLASE: Amylase: 37 U/L (ref 21–101)

## 2021-09-13 NOTE — Telephone Encounter (Signed)
Patient has been scheduled for a virtual visit on 09/25/2021 and had questions about what exactly Iodine is and what she needs to do about this in the mean time. AM ?

## 2021-09-13 NOTE — Progress Notes (Signed)
Iodine is high and you should stop any iodine supplements.

## 2021-09-25 ENCOUNTER — Encounter (HOSPITAL_COMMUNITY): Payer: Self-pay | Admitting: Psychiatry

## 2021-09-25 ENCOUNTER — Telehealth (INDEPENDENT_AMBULATORY_CARE_PROVIDER_SITE_OTHER): Payer: Medicare HMO | Admitting: Physician Assistant

## 2021-09-25 ENCOUNTER — Telehealth (INDEPENDENT_AMBULATORY_CARE_PROVIDER_SITE_OTHER): Payer: Medicare HMO | Admitting: Psychiatry

## 2021-09-25 DIAGNOSIS — F5102 Adjustment insomnia: Secondary | ICD-10-CM

## 2021-09-25 DIAGNOSIS — F419 Anxiety disorder, unspecified: Secondary | ICD-10-CM

## 2021-09-25 DIAGNOSIS — R7989 Other specified abnormal findings of blood chemistry: Secondary | ICD-10-CM | POA: Diagnosis not present

## 2021-09-25 DIAGNOSIS — M5030 Other cervical disc degeneration, unspecified cervical region: Secondary | ICD-10-CM

## 2021-09-25 DIAGNOSIS — R6889 Other general symptoms and signs: Secondary | ICD-10-CM

## 2021-09-25 DIAGNOSIS — F319 Bipolar disorder, unspecified: Secondary | ICD-10-CM

## 2021-09-25 MED ORDER — CITALOPRAM HYDROBROMIDE 10 MG PO TABS: ORAL_TABLET | ORAL | 4 refills | Status: DC

## 2021-09-25 NOTE — Progress Notes (Signed)
Patient ID: Kellie [name], female   DOB: January 01, 1956, 66 y.o.   MRN: [id number] ? ? ?Kellie [name] ?Follow up Outpatient visit ? ?Kellie [name] ?[id number] ?66 y.o. ? ?09/25/2021 ?9:47 AM ? ?Chief Complaint:  ? Follow up for Bipolar  And anxiety. ?Virtual Visit via Telephone Note ? ?I connected with Kellie [name] on 09/25/21 at  9:30 AM EDT by telephone and verified that I am speaking with the correct person using two identifiers. ? ?Location: ?Patient: home ?Provider: home office ?  ?I discussed the limitations, risks, security and privacy concerns of performing an evaluation and management service by telephone and the availability of in person appointments. I also discussed with the patient that there may be a patient responsible charge related to this service. The patient expressed understanding and agreed to proceed. ? ? ?  ?I discussed the assessment and treatment plan with the patient. The patient was provided an opportunity to ask questions and all were answered. The patient agreed with the plan and demonstrated an understanding of the instructions. ?  ?The patient was advised to call back or seek an in-person evaluation if the symptoms worsen or if the condition fails to improve as anticipated. ? ?I provided 18 minutes of non-face-to-face time during this encounter. ?Video was tried, connected for few minutes then was on audio, had difficulty with video ? ?History of Present Illness:  ? ?Doing fair ?Needs an emotional support animal letter that dog helps with emotions ?Ovearll getting thyroid testing ?Anxiety manageable  ?Tries to distract from negative thougthts ? ?  ? ?Modifying factor: family ?Duration more then 7 years ? ?Past Psychiatric History/Hospitalization(s) ?denies ? ?Hospitalization for psychiatric illness: No ?History of Electroconvulsive Shock Therapy: No ?Prior Suicide Attempts: No ? ?Medical History; ?Past Medical History:  ?Diagnosis Date  ? Abnormal Pap smear of cervix   ? Allergic  rhinitis 11/08/2010  ? Anxiety   ? Asthma   ? Bronchitis   ? GERD (gastroesophageal reflux disease)   ? Herpes simplex of female genitalia   ? Hiatal hernia   ? Hyperlipidemia   ? Mitral valve prolapse   ? Osteoporosis 11/21/2011  ? Thyroid nodule   ? Urticaria   ? ? ?Allergies: ?Allergies  ?Allergen Reactions  ? Codeine Nausea And Vomiting  ? Hydrocodone Nausea And Vomiting  ? Morphine Nausea And Vomiting  ? Abilify [Aripiprazole]   ?  Nausea/vomiting/weakness/shaky  ? Morphine And Related Nausea And Vomiting  ? Percocet [Oxycodone-Acetaminophen] Nausea And Vomiting and Other (See Comments)  ?  Patient states it makes blood pressure drop too much  ? Vicodin [Hydrocodone-Acetaminophen] Nausea And Vomiting  ? ? ?Medications: ?Outpatient Encounter Medications as of 09/25/2021  ?Medication Sig  ? acyclovir (ZOVIRAX) 200 MG capsule Take 2 capsules (400 mg total) by mouth 2 (two) times daily.  ? albuterol (VENTOLIN HFA) 108 (90 Base) MCG/ACT inhaler Inhale 2 puffs into the lungs every 4 (four) hours as needed for wheezing or shortness of breath.  ? ALPRAZolam (XANAX) 0.5 MG tablet TAKE 1 TABLET ONCE DAILY FOR ANXIETY.  ? AMBULATORY NON FORMULARY MEDICATION Incentive spirometer use as directed - fax to [facility] [phone]  ? BIOTIN PO Take 1 tablet by mouth daily.  ? Blood Glucose Monitoring Suppl (ONE TOUCH ULTRA 2) w/Device KIT Check fasting blood sugar every morning and 2 hours after largest meal of the day.  ? Cholecalciferol (VITAMIN D) 2000 units CAPS Take by mouth.  ? citalopram (CELEXA) 10 MG tablet Take  one a day  ? cyclobenzaprine (FLEXERIL) 10 MG tablet One half to one tab PO qHS  ? gabapentin (NEURONTIN) 100 MG capsule Take 1 capsule (100 mg total) by mouth 3 (three) times daily.  ? glucose blood (ONETOUCH ULTRA) test strip Check fasting blood sugar every morning and 2 hours after largest meal of the day.  ? hydrocortisone (ANUSOL-HC) 25 MG suppository Place 1 suppository (25 mg total)  rectally 2 (two) times daily as needed for hemorrhoids.  ? Lancets 30G MISC Check fasting blood sugar every morning and 2 hours after largest meal of the day.  ? Multiple Vitamins-Minerals (ZINC PO) Take 1 tablet by mouth daily.  ? pantoprazole (PROTONIX) 40 MG tablet TAKE (1) TABLET BY MOUTH TWICE DAILY.  ? Probiotic Product (PROBIOTIC & ACIDOPHILUS EX ST PO) Take by mouth.   ? Spacer/Aero-Holding Chambers (AEROCHAMBER MV) inhaler Use as instructed  ? [DISCONTINUED] citalopram (CELEXA) 10 MG tablet Take one a day  ? ?No facility-administered encounter medications on file as of 09/25/2021.  ? ? ? ?Family History; ?Family History  ?Problem Relation Age of Onset  ? Kidney cancer Mother 26  ? COPD Mother   ? Kidney disease Mother   ? Allergic rhinitis Mother   ? Asthma Mother   ? Heart disease Father   ? Allergic rhinitis Father   ? COPD Sister   ? Depression Sister   ? Pancreatic cancer Sister   ? Hypothyroidism Sister   ? Hypothyroidism Sister   ? Heart disease Brother   ? Heart attack Brother   ? Depression Maternal Aunt   ? Heart disease Paternal Uncle   ? Depression Maternal Grandfather   ? Diabetes Other   ?     neice  ? Thyroid disease Other   ?     neice  ? Colon cancer Neg Hx   ? Rectal cancer Neg Hx   ? Stomach cancer Neg Hx   ? Colon polyps Neg Hx   ? Esophageal cancer Neg Hx   ? ? ? ? ? ?Labs: ? ?Recent Results (from the past 2160 hour(s))  ?COMPLETE METABOLIC PANEL WITH GFR     Status: Abnormal  ? Collection Time: 08/24/21  7:55 AM  ?Result Value Ref Range  ? Glucose, Bld 83 65 - 139 mg/dL  ?  Comment: . ?       Non-fasting reference interval ?. ?  ? BUN 20 7 - 25 mg/dL  ? Creat 0.89 0.50 - 1.05 mg/dL  ? eGFR 71 > OR = 60 mL/min/1.78m  ?  Comment: The eGFR is based on the CKD-EPI 2021 equation. To calculate  ?the new eGFR from a previous Creatinine or Cystatin C ?result, go to https://www.kidney.org/professionals/ ?kdoqi/gfr%5Fcalculator ?  ? BUN/Creatinine Ratio NOT APPLICABLE 6 - 22 (calc)  ? Sodium 142  135 - 146 mmol/L  ? Potassium 4.2 3.5 - 5.3 mmol/L  ? Chloride 103 98 - 110 mmol/L  ? CO2 29 20 - 32 mmol/L  ? Calcium 9.8 8.6 - 10.4 mg/dL  ? Total Protein 6.2 6.1 - 8.1 g/dL  ? Albumin 4.4 3.6 - 5.1 g/dL  ? Globulin 1.8 (L) 1.9 - 3.7 g/dL (calc)  ? AG Ratio 2.4 1.0 - 2.5 (calc)  ? Total Bilirubin 0.4 0.2 - 1.2 mg/dL  ? Alkaline phosphatase (APISO) 42 37 - 153 U/L  ? AST 19 10 - 35 U/L  ? ALT 18 6 - 29 U/L  ?Cortisol     Status: None  ?  Collection Time: 08/24/21  7:55 AM  ?Result Value Ref Range  ? Cortisol, Plasma 13.0 mcg/dL  ?  Comment: Reference Range: For 8 a.m.(7-9 a.m.) Specimen: 4.0-22.0 ?Reference Range: For 4 p.m.(3-5 p.m.) Specimen: 3.0-17.0 ?  * Please interpret above results accordingly * ?. ?  ?ACTH     Status: None  ? Collection Time: 08/24/21  7:55 AM  ?Result Value Ref Range  ? C206 ACTH 39 6 - 50 pg/mL  ?  Comment: . ?Reference range applies only to specimens collected ?between 7am-10am. ?. ?  ?Thyroid Panel With TSH     Status: Abnormal  ? Collection Time: 08/24/21  7:55 AM  ?Result Value Ref Range  ? T3 Uptake 32 22 - 35 %  ? T4, Total 9.4 5.1 - 11.9 mcg/dL  ? Free Thyroxine Index 3.0 1.4 - 3.8  ? TSH 5.69 (H) 0.40 - 4.50 mIU/L  ?Thyroid peroxidase antibody     Status: None  ? Collection Time: 08/24/21  7:55 AM  ?Result Value Ref Range  ? Thyroperoxidase Ab SerPl-aCnc <1 <9 IU/mL  ?CBC with Differential/Platelet     Status: None  ? Collection Time: 08/24/21  7:55 AM  ?Result Value Ref Range  ? WBC 5.0 3.8 - 10.8 Thousand/uL  ? RBC 4.04 3.80 - 5.10 Million/uL  ? Hemoglobin 12.5 11.7 - 15.5 g/dL  ? HCT 37.7 35.0 - 45.0 %  ? MCV 93.3 80.0 - 100.0 fL  ? MCH 30.9 27.0 - 33.0 pg  ? MCHC 33.2 32.0 - 36.0 g/dL  ? RDW 11.8 11.0 - 15.0 %  ? Platelets 313 140 - 400 Thousand/uL  ? MPV 11.2 7.5 - 12.5 fL  ? Neutro Abs 2,070 1,500 - 7,800 cells/uL  ? Lymphs Abs 2,450 850 - 3,900 cells/uL  ? Absolute Monocytes 360 200 - 950 cells/uL  ? Eosinophils Absolute 50 15 - 500 cells/uL  ? Basophils Absolute 70 0 - 200  cells/uL  ? Neutrophils Relative % 41.4 %  ? Total Lymphocyte 49.0 %  ? Monocytes Relative 7.2 %  ? Eosinophils Relative 1.0 %  ? Basophils Relative 1.4 %  ?Sed Rate (ESR)     Status: None  ? Collection Time:

## 2021-09-26 DIAGNOSIS — R6889 Other general symptoms and signs: Secondary | ICD-10-CM | POA: Diagnosis not present

## 2021-09-26 DIAGNOSIS — R7989 Other specified abnormal findings of blood chemistry: Secondary | ICD-10-CM | POA: Diagnosis not present

## 2021-09-27 ENCOUNTER — Encounter: Payer: Self-pay | Admitting: Physician Assistant

## 2021-09-27 NOTE — Progress Notes (Signed)
TSH is MUCH improved and right there in normal range.  ?Iodine is pending.

## 2021-09-27 NOTE — Progress Notes (Signed)
.  Virtual Visit via Telephone Note ? ?I connected with Kellie [name] on 4/3/2023at  3:00 PM EDT by telephone and verified that I am speaking with the correct person using two identifiers. ? ?Location: ?Patient: home ?Provider: clinic ? ?[name] Kitchen.Participating in visit:  ?Patient: Kellie [name] ?Provider: [name] PA-C ?  ?I discussed the limitations, risks, security and privacy concerns of performing an evaluation and management service by telephone and the availability of in person appointments. I also discussed with the patient that there may be a patient responsible charge related to this service. The patient expressed understanding and agreed to proceed. ? ? ?History of Present Illness: ?Pt is a 66 yo female who wants to discuss elevated TSH and Iodine. She denies any supplements with iodine in them. No recent medication changes. She does eat quite a bit of fish.  ? ?  ?Observations/Objective: ?Anxious about health ? ? ?Assessment and Plan: ?..Auset was seen today for follow-up. ? ?Diagnoses and all orders for this visit: ? ?Elevated TSH ?-     Thyroid Panel With TSH ? ?Excessive iodine intake ?-     Iodine, Serum/Plasma ? ? ?Discussed labs with patient and potential causes of both being elevated and how we could treat.  ?I suspect iodine was elevated due to recent contrast for imaging she had done.  ?Discussed foods to limit.  ?Reassurance given that we will follow this closely.  ?Recheck labs.  ? ? ?Follow Up Instructions: ? ?  ?I discussed the assessment and treatment plan with the patient. The patient was provided an opportunity to ask questions and all were answered. The patient agreed with the plan and demonstrated an understanding of the instructions. ?  ?The patient was advised to call back or seek an in-person evaluation if the symptoms worsen or if the condition fails to improve as anticipated. ? ?I provided 20 minutes of non-face-to-face time during this encounter. ? ? ?[name], PA-C ? ?

## 2021-09-30 LAB — THYROID PANEL WITH TSH
Free Thyroxine Index: 2.7 (ref 1.4–3.8)
T3 Uptake: 27 % (ref 22–35)
T4, Total: 10.1 ug/dL (ref 5.1–11.9)
TSH: 2.35 mIU/L (ref 0.40–4.50)

## 2021-09-30 LAB — IODINE, SERUM/PLASMA: Iodine: 104 mcg/L (ref 52–109)

## 2021-10-01 ENCOUNTER — Encounter: Payer: Self-pay | Admitting: Physician Assistant

## 2021-10-01 NOTE — Progress Notes (Signed)
Iodine is in normal range at this time. Great news.

## 2021-10-05 ENCOUNTER — Encounter: Payer: Self-pay | Admitting: Physician Assistant

## 2021-10-28 ENCOUNTER — Other Ambulatory Visit (HOSPITAL_COMMUNITY): Payer: Self-pay | Admitting: Psychiatry

## 2021-10-28 DIAGNOSIS — M5030 Other cervical disc degeneration, unspecified cervical region: Secondary | ICD-10-CM

## 2021-10-28 DIAGNOSIS — M5412 Radiculopathy, cervical region: Secondary | ICD-10-CM

## 2021-11-02 ENCOUNTER — Other Ambulatory Visit: Payer: Self-pay | Admitting: Physician Assistant

## 2021-11-02 DIAGNOSIS — K449 Diaphragmatic hernia without obstruction or gangrene: Secondary | ICD-10-CM

## 2021-11-02 DIAGNOSIS — R1013 Epigastric pain: Secondary | ICD-10-CM

## 2021-11-14 ENCOUNTER — Ambulatory Visit (INDEPENDENT_AMBULATORY_CARE_PROVIDER_SITE_OTHER): Payer: Medicare HMO | Admitting: Psychiatry

## 2021-11-14 ENCOUNTER — Encounter (HOSPITAL_COMMUNITY): Payer: Self-pay | Admitting: Psychiatry

## 2021-11-14 VITALS — BP 102/64 | HR 84 | Temp 97.9°F | Ht 65.5 in | Wt 108.0 lb

## 2021-11-14 DIAGNOSIS — F319 Bipolar disorder, unspecified: Secondary | ICD-10-CM

## 2021-11-14 DIAGNOSIS — F419 Anxiety disorder, unspecified: Secondary | ICD-10-CM | POA: Diagnosis not present

## 2021-11-14 DIAGNOSIS — F5102 Adjustment insomnia: Secondary | ICD-10-CM | POA: Diagnosis not present

## 2021-11-14 NOTE — Progress Notes (Signed)
Patient ID: Kellie [name], female   DOB: Nov 07, 1955, 66 y.o.   MRN: [id number]   [facility] Follow up Outpatient visit  Kellie [name] [id number] 66 y.o.  11/14/2021 3:16 PM  Chief Complaint:   Follow up for Bipolar  And anxiety. Need a letter for emotional support animal  History of Present Illness:   Has been doing fair but upset due to need for emotional support animal, otherwise would get evicted  She is tolerating meds but need letter , stressed out and made earlier appointment  Anxiety otherwise manageable and support animal has kept her more stable along with medications     Modifying factor:famioy Duration  8 years plus  Aggravating factor: crowds or conflicts  Past Psychiatric History/Hospitalization(s) denies  Hospitalization for psychiatric illness: No History of Electroconvulsive Shock Therapy: No Prior Suicide Attempts: No  Medical History; Past Medical History:  Diagnosis Date   Abnormal Pap smear of cervix    Allergic rhinitis 11/08/2010   Anxiety    Asthma    Bronchitis    GERD (gastroesophageal reflux disease)    Herpes simplex of female genitalia    Hiatal hernia    Hyperlipidemia    Mitral valve prolapse    Osteoporosis 11/21/2011   Thyroid nodule    Urticaria     Allergies: Allergies  Allergen Reactions   Codeine Nausea And Vomiting   Hydrocodone Nausea And Vomiting   Morphine Nausea And Vomiting   Abilify [Aripiprazole]     Nausea/vomiting/weakness/shaky   Morphine And Related Nausea And Vomiting   Percocet [Oxycodone-Acetaminophen] Nausea And Vomiting and Other (See Comments)    Patient states it makes blood pressure drop too much   Vicodin [Hydrocodone-Acetaminophen] Nausea And Vomiting    Medications: Outpatient Encounter Medications as of 11/14/2021  Medication Sig   acyclovir (ZOVIRAX) 200 MG capsule Take 2 capsules (400 mg total) by mouth 2 (two) times daily.   albuterol (VENTOLIN HFA) 108 (90 Base) MCG/ACT  inhaler Inhale 2 puffs into the lungs every 4 (four) hours as needed for wheezing or shortness of breath.   ALPRAZolam (XANAX) 0.5 MG tablet TAKE 1 TABLET ONCE DAILY FOR ANXIETY.   AMBULATORY NON FORMULARY MEDICATION Incentive spirometer use as directed - fax to [facility] [phone]   Cholecalciferol (VITAMIN D) 2000 units CAPS Take by mouth.   citalopram (CELEXA) 10 MG tablet Take one a day   cyclobenzaprine (FLEXERIL) 10 MG tablet One half to one tab PO qHS   gabapentin (NEURONTIN) 100 MG capsule TAKE ONE CAPSULE ($RemoveBefo'100MG'FUqHauSuAXF$  TOTAL) BY MOUTH THREE TIMES DAILY   pantoprazole (PROTONIX) 40 MG tablet TAKE (1) TABLET BY MOUTH TWICE DAILY.   Probiotic Product (PROBIOTIC & ACIDOPHILUS EX ST PO) Take by mouth.    Spacer/Aero-Holding Chambers (AEROCHAMBER MV) inhaler Use as instructed   BIOTIN PO Take 1 tablet by mouth daily. (Patient not taking: Reported on 11/14/2021)   Blood Glucose Monitoring Suppl (ONE TOUCH ULTRA 2) w/Device KIT Check fasting blood sugar every morning and 2 hours after largest meal of the day. (Patient not taking: Reported on 11/14/2021)   glucose blood (ONETOUCH ULTRA) test strip Check fasting blood sugar every morning and 2 hours after largest meal of the day. (Patient not taking: Reported on 11/14/2021)   hydrocortisone (ANUSOL-HC) 25 MG suppository Place 1 suppository (25 mg total) rectally 2 (two) times daily as needed for hemorrhoids. (Patient not taking: Reported on 11/14/2021)   Lancets 30G MISC Check fasting blood sugar every morning and 2  hours after largest meal of the day. (Patient not taking: Reported on 11/14/2021)   Multiple Vitamins-Minerals (ZINC PO) Take 1 tablet by mouth daily. (Patient not taking: Reported on 11/14/2021)   No facility-administered encounter medications on file as of 11/14/2021.     Family History; Family History  Problem Relation Age of Onset   Kidney cancer Mother 4   COPD Mother    Kidney disease Mother    Allergic rhinitis  Mother    Asthma Mother    Heart disease Father    Allergic rhinitis Father    COPD Sister    Depression Sister    Pancreatic cancer Sister    Hypothyroidism Sister    Hypothyroidism Sister    Heart disease Brother    Heart attack Brother    Depression Maternal Aunt    Heart disease Paternal Uncle    Depression Maternal Grandfather    Diabetes Other        neice   Thyroid disease Other        neice   Colon cancer Neg Hx    Rectal cancer Neg Hx    Stomach cancer Neg Hx    Colon polyps Neg Hx    Esophageal cancer Neg Hx        Labs:  Recent Results (from the past 2160 hour(s))  COMPLETE METABOLIC PANEL WITH GFR     Status: Abnormal   Collection Time: 08/24/21  7:55 AM  Result Value Ref Range   Glucose, Bld 83 65 - 139 mg/dL    Comment: .        Non-fasting reference interval .    BUN 20 7 - 25 mg/dL   Creat 0.89 0.50 - 1.05 mg/dL   eGFR 71 > OR = 60 mL/min/1.18m2    Comment: The eGFR is based on the CKD-EPI 2021 equation. To calculate  the new eGFR from a previous Creatinine or Cystatin C result, go to https://www.kidney.org/professionals/ kdoqi/gfr%5Fcalculator    BUN/Creatinine Ratio NOT APPLICABLE 6 - 22 (calc)   Sodium 142 135 - 146 mmol/L   Potassium 4.2 3.5 - 5.3 mmol/L   Chloride 103 98 - 110 mmol/L   CO2 29 20 - 32 mmol/L   Calcium 9.8 8.6 - 10.4 mg/dL   Total Protein 6.2 6.1 - 8.1 g/dL   Albumin 4.4 3.6 - 5.1 g/dL   Globulin 1.8 (L) 1.9 - 3.7 g/dL (calc)   AG Ratio 2.4 1.0 - 2.5 (calc)   Total Bilirubin 0.4 0.2 - 1.2 mg/dL   Alkaline phosphatase (APISO) 42 37 - 153 U/L   AST 19 10 - 35 U/L   ALT 18 6 - 29 U/L  Cortisol     Status: None   Collection Time: 08/24/21  7:55 AM  Result Value Ref Range   Cortisol, Plasma 13.0 mcg/dL    Comment: Reference Range: For 8 a.m.(7-9 a.m.) Specimen: 4.0-22.0 Reference Range: For 4 p.m.(3-5 p.m.) Specimen: 3.0-17.0   * Please interpret above results accordingly * .   ACTH     Status: None   Collection  Time: 08/24/21  7:55 AM  Result Value Ref Range   C206 ACTH 39 6 - 50 pg/mL    Comment: . Reference range applies only to specimens collected between 7am-10am. .   Thyroid Panel With TSH     Status: Abnormal   Collection Time: 08/24/21  7:55 AM  Result Value Ref Range   T3 Uptake 32 22 - 35 %   T4, Total  9.4 5.1 - 11.9 mcg/dL   Free Thyroxine Index 3.0 1.4 - 3.8   TSH 5.69 (H) 0.40 - 4.50 mIU/L  Thyroid peroxidase antibody     Status: None   Collection Time: 08/24/21  7:55 AM  Result Value Ref Range   Thyroperoxidase Ab SerPl-aCnc <1 <9 IU/mL  CBC with Differential/Platelet     Status: None   Collection Time: 08/24/21  7:55 AM  Result Value Ref Range   WBC 5.0 3.8 - 10.8 Thousand/uL   RBC 4.04 3.80 - 5.10 Million/uL   Hemoglobin 12.5 11.7 - 15.5 g/dL   HCT 37.7 35.0 - 45.0 %   MCV 93.3 80.0 - 100.0 fL   MCH 30.9 27.0 - 33.0 pg   MCHC 33.2 32.0 - 36.0 g/dL   RDW 11.8 11.0 - 15.0 %   Platelets 313 140 - 400 Thousand/uL   MPV 11.2 7.5 - 12.5 fL   Neutro Abs 2,070 1,500 - 7,800 cells/uL   Lymphs Abs 2,450 850 - 3,900 cells/uL   Absolute Monocytes 360 200 - 950 cells/uL   Eosinophils Absolute 50 15 - 500 cells/uL   Basophils Absolute 70 0 - 200 cells/uL   Neutrophils Relative % 41.4 %   Total Lymphocyte 49.0 %   Monocytes Relative 7.2 %   Eosinophils Relative 1.0 %   Basophils Relative 1.4 %  Sed Rate (ESR)     Status: None   Collection Time: 08/24/21  7:55 AM  Result Value Ref Range   Sed Rate 6 0 - 30 mm/h  Fe+TIBC+Fer     Status: None   Collection Time: 08/24/21  7:55 AM  Result Value Ref Range   Iron 77 45 - 160 mcg/dL   TIBC 282 250 - 450 mcg/dL (calc)   %SAT 27 16 - 45 % (calc)   Ferritin 41 16 - 288 ng/mL  B12 and Folate Panel     Status: None   Collection Time: 08/24/21  7:55 AM  Result Value Ref Range   Vitamin B-12 500 200 - 1,100 pg/mL   Folate 20.5 ng/mL    Comment:                            Reference Range                            Low:            <3.4                            Borderline:    3.4-5.4                            Normal:        >5.4 .   VITAMIN D 25 Hydroxy (Vit-D Deficiency, Fractures)     Status: None   Collection Time: 08/24/21  7:55 AM  Result Value Ref Range   Vit D, 25-Hydroxy 45 30 - 100 ng/mL    Comment: Vitamin D Status         25-OH Vitamin D: . Deficiency:                    <20 ng/mL Insufficiency:             20 - 29 ng/mL Optimal:                 >  or = 30 ng/mL . For 25-OH Vitamin D testing on patients on  D2-supplementation and patients for whom quantitation  of D2 and D3 fractions is required, the QuestAssureD(TM) 25-OH VIT D, (D2,D3), LC/MS/MS is recommended: order  code [id number] (patients >40yr). See Note 1 . Note 1 . For additional information, please refer to  http://education.[facility].com/faq/FAQ199  (This link is being provided for informational/ educational purposes only.)   C-reactive protein     Status: None   Collection Time: 08/24/21  7:55 AM  Result Value Ref Range   CRP 0.5 <8.0 mg/L  ANA Screen,IFA,Reflex Titer/Pattern,Reflex Mplx 11 Ab Cascade with IdentRA     Status: None   Collection Time: 08/24/21  7:55 AM  Result Value Ref Range   Anti Nuclear Antibody (ANA) NEGATIVE NEGATIVE    Comment: ANA IFA is a first line screen for detecting the presence of up to approximately 150 autoantibodies in various autoimmune diseases. A negative ANA IFA result suggests an ANA-associated autoimmune disease is not present at this time, and does not reflex further. If there is high clinical suspicion for Sjogren's syndrome, testing for anti-SS-A/Ro antibody should be considered. Anti-Jo-1 antibody should be considered for clinically suspected inflammatory myopathies. . AC-0: Negative . International Consensus on ANA Patterns (hhttps://www.hernandez-brewer.com/ . For additional information, please refer to http://education.[facility].com/faq/FAQ177 (This link is  being provided for informational/ educational purposes only.) .    Rhuematoid fact SerPl-aCnc <<50<<35IU/mL   Cyclic Citrullin Peptide Ab <16 UNITS    Comment: Reference Range Negative:            <20 Weak Positive:       20-39 Moderate Positive:   40-59 Strong Positive:     >59 .    14-3-3 eta Protein <0.2 <0.2 ng/mL    Comment: . The 14-3-3eta protein is a marker of synovial inflammation that is released into synovial fluid and peripheral blood in rheumatoid arthritis (RA) and erosive psoriatic arthritis. One in five RF and CCP seronegative early stage RA patients is found to be positive for 14-3-3eta protein. Patients with active joint RA disease have higher values of 14-3-3eta protein than those with inactive RA or psoriasis without arthritis. 14-3-3eta protein has a 93% specificity in patients with RA. Values > or = 0.2 ng/mL are elevated and indicative of RA disease or erosive psoriatic arthritis. Values >0.50 ng/mL are associated with more aggressive RA disease and poorer outcomes. Unlike RF and CCP, 14-3-3eta protein is a therapeutically modifiable marker to monitor response to therapy. A decrease in 14-3-3eta protein in response to DMARDs (disease-modifying antirheumatic drugs) and anti-TNF (tumor necrosis factor) drugs indicates better clinical outcomes; an increase is associated with  worse outcomes despite apparent clinical remission. . For further information please visit: http://www.quest[facility].com/testcenter/testguide.action? dc=TS-RmArthPnl . This test was developed and its analytical performance characteristics have been determined by [facility] It has not been cleared or approved by FDA. This assay has been validated pursuant to the CLIA regulations and is used for clinical purposes. .   Lipase     Status: Abnormal   Collection Time: 08/24/21  7:55 AM  Result Value Ref Range   Lipase 99 (H) 7 - 60  U/L  Lipase     Status: None   Collection Time: 09/11/21  7:46 AM  Result Value Ref Range   Lipase 43 7 - 60 U/L  Amylase     Status: None   Collection Time: 09/11/21  7:46 AM  Result Value  Ref Range   Amylase 37 21 - 101 U/L  Iodine, Serum/Plasma     Status: Abnormal   Collection Time: 09/11/21  7:46 AM  Result Value Ref Range   Iodine 136 (H) 52 - 109 mcg/L    Comment: . This test was developed and its analytical performance characteristics have been determined by [place], [place]. It has not been cleared or approved by the U.S. Food and Drug Administration. This assay has been validated pursuant to the CLIA regulations and is used for clinical purposes. [name] Kitchen   Heavy Metals Panel, Blood     Status: None   Collection Time: 09/11/21  7:46 AM  Result Value Ref Range   Arsenic <3 <23 mcg/L    Comment: . Whole Blood Arsenic level >100 mcg/L is indicative of acute/chronic exposure. Urine is usually the best specimen for the analysis of arsenic in body fluids. Blood levels tend to be low even when urine concentrations are high . [name] Kitchen This test was developed and its analytical performance characteristics have been determined by [place], [place]. It has not been cleared or approved by the U.S. Food and Drug Administration. This assay has been validated pursuant to the CLIA regulations and is used for clinical purposes. .    Mercury, B <4 <=10 mcg/L    Comment: . This test was developed and its analytical performance characteristics have been determined by [place], [place]. It has not been cleared or approved by the U.S. Food and Drug Administration. This assay has been validated pursuant to the CLIA regulations and is used for clinical purposes. .    Lead 1.0 <3.5 mcg/dL    Comment: . A blood lead reference value of <5 mcg/dL should apply to only [place] state residents per  [facility]. [name] Kitchen Analysis was performed by [facility] ([facility]) . This test was developed and its analytical performance characteristics have been determined by [place], [place]. It has not been cleared or approved by the U.S. Food and Drug Administration. This assay has been validated pursuant to the CLIA regulations and is used for clinical purposes. .   Iodine, Serum/Plasma     Status: None   Collection Time: 09/26/21  7:16 AM  Result Value Ref Range   Iodine 104 52 - 109 mcg/L    Comment: . This test was developed and its analytical performance characteristics have been determined by [place], [place]. It has not been cleared or approved by the U.S. Food and Drug Administration. This assay has been validated pursuant to the CLIA regulations and is used for clinical purposes. .   Thyroid Panel With TSH     Status: None   Collection Time: 09/26/21  7:16 AM  Result Value Ref Range   T3 Uptake 27 22 - 35 %   T4, Total 10.1 5.1 - 11.9 mcg/dL   Free Thyroxine Index 2.7 1.4 - 3.8   TSH 2.35 0.40 - 4.50 mIU/L      Mental Status Examination;   Psychiatric Specialty Exam: Physical Exam  Review of Systems  Cardiovascular:  Negative for chest pain.  Psychiatric/Behavioral:  Negative for depression and suicidal ideas. The patient is nervous/anxious.    Blood pressure 102/64, pulse 84, temperature 97.9 F (36.6 C), height 5' 5.5" (1.664 m), weight 108 lb (49 kg), SpO2 99 %.Body mass index is 17.7 kg/m.  General Appearance: casual  Eye  Contact::  fair  Speech:  coherent  Volume:  Normal  Mood: fair  Affect:   Thought Process: clear . No psychosis  Orientation:  Full (Time, Place, and Person)  Thought Content:  Rumination  Suicidal Thoughts:  No  Homicidal Thoughts:  No  Memory:  Immediate;   Fair Recent;   Fair  Judgement:  Fair  Insight:  Shallow  Psychomotor Activity:   Increased  Concentration:  Fair  Recall:  Fair  Akathisia:  Negative  Handed:  Right  AIMS (if indicated):     Assets:  Desire for Improvement Physical Health Transportation  Sleep:        Assessment: Axis I: bipolar disorder II unspecified or depressed type. Anxiety disorder NOS. Insomnia  Axis II: deferred  Axis III:  Past Medical History:  Diagnosis Date   Abnormal Pap smear of cervix    Allergic rhinitis 11/08/2010   Anxiety    Asthma    Bronchitis    GERD (gastroesophageal reflux disease)    Herpes simplex of female genitalia    Hiatal hernia    Hyperlipidemia    Mitral valve prolapse    Osteoporosis 11/21/2011   Thyroid nodule    Urticaria     Axis IV: psychosocial   Treatment Plan and Summary:  Prior documentation reviewed   Bipolar : baseline ,continue gabapentin    GAD:  worries related to health and support animal , will write letter for emotional support animal Continue celexa medication , work on ME time and distraction   Insomnia: manageable with sleep hygiene, continue   Fu 36mCall for refills or due were sent Direct care time spent in office including face to face 21m, including chart review and documentation Wrote letter Coded: complexity NaMerian CapronMD 11/14/2021

## 2021-11-28 ENCOUNTER — Other Ambulatory Visit: Payer: Self-pay | Admitting: Physician Assistant

## 2021-11-28 DIAGNOSIS — F418 Other specified anxiety disorders: Secondary | ICD-10-CM

## 2021-11-28 DIAGNOSIS — F419 Anxiety disorder, unspecified: Secondary | ICD-10-CM

## 2021-11-28 DIAGNOSIS — A6004 Herpesviral vulvovaginitis: Secondary | ICD-10-CM

## 2021-12-04 ENCOUNTER — Encounter: Payer: Self-pay | Admitting: Physician Assistant

## 2021-12-04 DIAGNOSIS — R7989 Other specified abnormal findings of blood chemistry: Secondary | ICD-10-CM

## 2021-12-04 DIAGNOSIS — R6889 Other general symptoms and signs: Secondary | ICD-10-CM

## 2021-12-04 DIAGNOSIS — Z79899 Other long term (current) drug therapy: Secondary | ICD-10-CM

## 2021-12-04 DIAGNOSIS — R5381 Other malaise: Secondary | ICD-10-CM

## 2021-12-07 DIAGNOSIS — R6889 Other general symptoms and signs: Secondary | ICD-10-CM | POA: Diagnosis not present

## 2021-12-07 DIAGNOSIS — Z79899 Other long term (current) drug therapy: Secondary | ICD-10-CM | POA: Diagnosis not present

## 2021-12-07 DIAGNOSIS — R7989 Other specified abnormal findings of blood chemistry: Secondary | ICD-10-CM | POA: Diagnosis not present

## 2021-12-07 DIAGNOSIS — R5383 Other fatigue: Secondary | ICD-10-CM | POA: Diagnosis not present

## 2021-12-07 DIAGNOSIS — R5381 Other malaise: Secondary | ICD-10-CM | POA: Diagnosis not present

## 2021-12-08 NOTE — Progress Notes (Signed)
Thyroid looks great.  Kidney and glucose look great.  Calcium looks great.  Iodine pending.

## 2021-12-10 LAB — BASIC METABOLIC PANEL
BUN: 17 mg/dL (ref 7–25)
CO2: 25 mmol/L (ref 20–32)
Calcium: 9.6 mg/dL (ref 8.6–10.4)
Chloride: 101 mmol/L (ref 98–110)
Creat: 0.78 mg/dL (ref 0.50–1.05)
Glucose, Bld: 77 mg/dL (ref 65–99)
Potassium: 4.3 mmol/L (ref 3.5–5.3)
Sodium: 138 mmol/L (ref 135–146)

## 2021-12-10 LAB — IODINE, SERUM/PLASMA: Iodine: 84 mcg/L (ref 52–109)

## 2021-12-10 LAB — TSH: TSH: 2.03 mIU/L (ref 0.40–4.50)

## 2021-12-11 NOTE — Progress Notes (Signed)
Iodine is in normal range.

## 2021-12-13 DIAGNOSIS — J45909 Unspecified asthma, uncomplicated: Secondary | ICD-10-CM | POA: Diagnosis not present

## 2021-12-13 DIAGNOSIS — K219 Gastro-esophageal reflux disease without esophagitis: Secondary | ICD-10-CM | POA: Diagnosis not present

## 2021-12-13 DIAGNOSIS — B009 Herpesviral infection, unspecified: Secondary | ICD-10-CM | POA: Diagnosis not present

## 2021-12-13 DIAGNOSIS — F418 Other specified anxiety disorders: Secondary | ICD-10-CM | POA: Diagnosis not present

## 2021-12-13 DIAGNOSIS — Z0189 Encounter for other specified special examinations: Secondary | ICD-10-CM | POA: Diagnosis not present

## 2021-12-13 DIAGNOSIS — R7303 Prediabetes: Secondary | ICD-10-CM | POA: Diagnosis not present

## 2021-12-13 DIAGNOSIS — F319 Bipolar disorder, unspecified: Secondary | ICD-10-CM | POA: Diagnosis not present

## 2021-12-18 DIAGNOSIS — H04123 Dry eye syndrome of bilateral lacrimal glands: Secondary | ICD-10-CM | POA: Diagnosis not present

## 2021-12-18 DIAGNOSIS — H524 Presbyopia: Secondary | ICD-10-CM | POA: Diagnosis not present

## 2021-12-18 DIAGNOSIS — H5203 Hypermetropia, bilateral: Secondary | ICD-10-CM | POA: Diagnosis not present

## 2021-12-18 DIAGNOSIS — H52203 Unspecified astigmatism, bilateral: Secondary | ICD-10-CM | POA: Diagnosis not present

## 2021-12-18 DIAGNOSIS — H2513 Age-related nuclear cataract, bilateral: Secondary | ICD-10-CM | POA: Diagnosis not present

## 2021-12-18 DIAGNOSIS — H25013 Cortical age-related cataract, bilateral: Secondary | ICD-10-CM | POA: Diagnosis not present

## 2021-12-19 DIAGNOSIS — R7303 Prediabetes: Secondary | ICD-10-CM | POA: Diagnosis not present

## 2021-12-19 DIAGNOSIS — I10 Essential (primary) hypertension: Secondary | ICD-10-CM | POA: Diagnosis not present

## 2021-12-19 DIAGNOSIS — F319 Bipolar disorder, unspecified: Secondary | ICD-10-CM | POA: Diagnosis not present

## 2021-12-27 ENCOUNTER — Other Ambulatory Visit: Payer: Self-pay | Admitting: Family Medicine

## 2021-12-27 ENCOUNTER — Other Ambulatory Visit (HOSPITAL_COMMUNITY): Payer: Self-pay | Admitting: Family Medicine

## 2021-12-27 DIAGNOSIS — K219 Gastro-esophageal reflux disease without esophagitis: Secondary | ICD-10-CM | POA: Diagnosis not present

## 2021-12-27 DIAGNOSIS — Z681 Body mass index (BMI) 19 or less, adult: Secondary | ICD-10-CM | POA: Diagnosis not present

## 2021-12-27 DIAGNOSIS — E785 Hyperlipidemia, unspecified: Secondary | ICD-10-CM

## 2021-12-27 DIAGNOSIS — B009 Herpesviral infection, unspecified: Secondary | ICD-10-CM | POA: Diagnosis not present

## 2021-12-27 DIAGNOSIS — F418 Other specified anxiety disorders: Secondary | ICD-10-CM | POA: Diagnosis not present

## 2021-12-27 DIAGNOSIS — R7303 Prediabetes: Secondary | ICD-10-CM | POA: Diagnosis not present

## 2021-12-27 DIAGNOSIS — R634 Abnormal weight loss: Secondary | ICD-10-CM | POA: Diagnosis not present

## 2021-12-27 DIAGNOSIS — F319 Bipolar disorder, unspecified: Secondary | ICD-10-CM | POA: Diagnosis not present

## 2021-12-27 DIAGNOSIS — J45909 Unspecified asthma, uncomplicated: Secondary | ICD-10-CM | POA: Diagnosis not present

## 2021-12-28 ENCOUNTER — Other Ambulatory Visit (HOSPITAL_COMMUNITY): Payer: Self-pay | Admitting: Psychiatry

## 2021-12-28 ENCOUNTER — Other Ambulatory Visit: Payer: Self-pay | Admitting: Physician Assistant

## 2021-12-28 DIAGNOSIS — M5030 Other cervical disc degeneration, unspecified cervical region: Secondary | ICD-10-CM

## 2021-12-28 DIAGNOSIS — M5412 Radiculopathy, cervical region: Secondary | ICD-10-CM

## 2021-12-28 DIAGNOSIS — A6004 Herpesviral vulvovaginitis: Secondary | ICD-10-CM

## 2022-01-09 DIAGNOSIS — H52223 Regular astigmatism, bilateral: Secondary | ICD-10-CM | POA: Diagnosis not present

## 2022-01-09 DIAGNOSIS — H524 Presbyopia: Secondary | ICD-10-CM | POA: Diagnosis not present

## 2022-01-27 ENCOUNTER — Other Ambulatory Visit: Payer: Self-pay | Admitting: Physician Assistant

## 2022-01-27 ENCOUNTER — Other Ambulatory Visit (HOSPITAL_COMMUNITY): Payer: Self-pay | Admitting: Psychiatry

## 2022-01-27 DIAGNOSIS — M5412 Radiculopathy, cervical region: Secondary | ICD-10-CM

## 2022-01-27 DIAGNOSIS — M5030 Other cervical disc degeneration, unspecified cervical region: Secondary | ICD-10-CM

## 2022-01-27 DIAGNOSIS — A6004 Herpesviral vulvovaginitis: Secondary | ICD-10-CM

## 2022-01-29 NOTE — Telephone Encounter (Signed)
Please advise on refill, given a month ago.

## 2022-02-21 ENCOUNTER — Ambulatory Visit: Payer: Medicare HMO | Admitting: Nutrition

## 2022-02-27 ENCOUNTER — Other Ambulatory Visit (HOSPITAL_COMMUNITY): Payer: Self-pay | Admitting: Psychiatry

## 2022-02-27 ENCOUNTER — Other Ambulatory Visit: Payer: Self-pay | Admitting: Physician Assistant

## 2022-02-27 DIAGNOSIS — F419 Anxiety disorder, unspecified: Secondary | ICD-10-CM

## 2022-02-27 DIAGNOSIS — F418 Other specified anxiety disorders: Secondary | ICD-10-CM

## 2022-02-27 DIAGNOSIS — M5030 Other cervical disc degeneration, unspecified cervical region: Secondary | ICD-10-CM

## 2022-02-27 DIAGNOSIS — M5412 Radiculopathy, cervical region: Secondary | ICD-10-CM

## 2022-02-27 NOTE — Telephone Encounter (Signed)
Xanax last written 11/28/2021 #30 with 2 refills  Last appt 09/25/2021

## 2022-03-07 ENCOUNTER — Other Ambulatory Visit: Payer: Self-pay | Admitting: Family Medicine

## 2022-03-07 DIAGNOSIS — Z1231 Encounter for screening mammogram for malignant neoplasm of breast: Secondary | ICD-10-CM

## 2022-03-12 ENCOUNTER — Encounter: Payer: Self-pay | Admitting: Physician Assistant

## 2022-03-19 ENCOUNTER — Ambulatory Visit (INDEPENDENT_AMBULATORY_CARE_PROVIDER_SITE_OTHER): Payer: Medicare HMO | Admitting: Physician Assistant

## 2022-03-19 VITALS — BP 109/62 | HR 70 | Ht 65.5 in | Wt 107.0 lb

## 2022-03-19 DIAGNOSIS — J989 Respiratory disorder, unspecified: Secondary | ICD-10-CM

## 2022-03-19 DIAGNOSIS — J301 Allergic rhinitis due to pollen: Secondary | ICD-10-CM

## 2022-03-19 DIAGNOSIS — R053 Chronic cough: Secondary | ICD-10-CM

## 2022-03-19 DIAGNOSIS — E041 Nontoxic single thyroid nodule: Secondary | ICD-10-CM

## 2022-03-19 NOTE — Progress Notes (Signed)
Acute Office Visit  Subjective:     Patient ID: Kellie [name], female    DOB: Dec 05, 1955, 66 y.o.   MRN: [id number]  Chief Complaint  Patient presents with   Cough    Hoarseness, feeling tightness in chest, coughing up tan color once weekly   Hip Pain    Left hip and low back pain    HPI Patient is in today for chronic cough. At times she can cough up some tan color. No fever, chills, sinus pressure or ear pain. No ST. She has some hoarseness. She has had EGD and seen GI. She has seen pulmonology. No SOB. Albuterol is not really helping. She is not taking zyrtec. She is taking pepcid and prontonix.   Pt was concerned about her thyroid nodule follow up and wonders when it is.  .. Active Ambulatory Problems    Diagnosis Date Noted   GERD (gastroesophageal reflux disease) 09/27/2010   Allergic rhinitis 11/08/2010   Insomnia 11/08/2010   Chronic cough 06/02/2011   Osteoporosis 11/21/2011   Borderline intellectual functioning 02/15/2012   Genital herpes 11/05/2012   Hemorrhoids 11/05/2012   Chronic periodontal disease 06/30/2013   Adhesive capsulitis of right shoulder 11/24/2013   Encounter for gynecological examination with Papanicolaou smear of cervix 12/08/2013   Prediabetes 12/14/2013   Postmenopausal atrophic vaginitis 03/17/2014   Bipolar disorder with depression ([place]) 06/02/2014   Family history of renal cancer 10/11/2014   DDD (degenerative disc disease), lumbar 10/11/2014   Rhinitis, allergic 10/14/2014   Near syncope 02/09/2015   Chronic nasal congestion 03/01/2015   Vitreous floaters of right eye 03/01/2015   DDD (degenerative disc disease), cervical 07/05/2015   Cough, persistent 07/21/2015   Post-nasal drip 07/21/2015   Left knee pain 11/09/2015   Chronic pain of both knees 11/09/2015   Plantar fasciitis, bilateral 11/09/2015   Hoarseness 03/09/2016   Pulmonary nodule 08/08/2016   Bipolar 1 disorder with moderate mania ([place]) 09/17/2016   Panic attacks  09/18/2016   SOB (shortness of breath) 09/18/2016   Weakness 10/05/2016   Family history of pancreatic cancer 02/18/2017   Diarrhea 02/18/2017   Loss of weight 02/18/2017   Unintentional weight loss 05/18/2017   Elevated LDL cholesterol level 05/18/2017   Hyperlipidemia 05/18/2017   Irritable bowel syndrome with both constipation and diarrhea 05/18/2017   History of loop electrical excision procedure (LEEP) 07/01/2017   Atypical chest pain 10/22/2017   Anxiety 10/22/2017   Aortic insufficiency 01/10/2018   Non-restorative sleep 01/10/2018   Snoring 01/10/2018   Dermatochalasis of both upper eyelids 03/13/2018   Periodic limb movement 03/13/2018   Nocturnal leg cramps 03/13/2018   Vaginal dryness 09/02/2018   History of diverticulitis 09/16/2018   Chronic pain of both ankles 01/15/2019   Cervical stenosis (uterine cervix) 02/13/2019   Vitamin D insufficiency 02/16/2019   Plantar fasciitis, right 04/02/2019   Reactive airway disease without asthma 04/17/2019   Cortical age-related cataract of both eyes 12/22/2018   Pinguecula of both eyes 12/22/2018   Nuclear sclerotic cataract of both eyes 12/22/2018   Anterolisthesis 07/28/2019   Vitamin D deficiency 12/02/2019   No energy 12/02/2019   Rectal bleeding 12/02/2019   Dysphagia 12/02/2019   Tenderness of neck 12/02/2019   Hyperinflation of lungs 01/15/2020   History of iron deficiency 02/15/2020   Globin abnormality ([place]) 02/19/2020   Hiatal hernia 02/19/2020   Gastroesophageal reflux disease with esophagitis 02/19/2020   Belching 03/23/2020   Leukopenia 06/21/2020   Low serum calcium 06/21/2020  Breast pain, right 07/11/2020   Nonrheumatic aortic valve insufficiency 09/20/2020   Bilateral leg pain 09/20/2020   Numbness and tingling of both feet 09/20/2020   Family history of stroke 09/20/2020   Ankle edema, bilateral 09/20/2020   Anxiety about health 09/20/2020   Cervical radiculopathy 05/03/2021   Left thyroid nodule  06/20/2021   Elevated lipase 08/25/2021   Elevated TSH 08/25/2021   Nausea and vomiting 09/08/2021   Upper abdominal pain 09/08/2021   Resolved Ambulatory Problems    Diagnosis Date Noted   Sinusitis 09/27/2010   Depression 09/27/2010   Bronchitis 12/04/2010   General medical examination 10/18/2011   Dizziness 11/21/2011   Knee pain 11/21/2011   Low back pain 11/21/2011   Heel pain 07/14/2012   Thoracic myofascial strain 03/13/2013   Sinusitis 04/20/2013   Cough 04/22/2013   Rotator cuff disorder 08/01/2013   Right shoulder pain 10/27/2013   Adhesive capsulitis 11/02/2013   Pain in joint, shoulder region 11/24/2013   Bipolar disorder ([place]) 04/29/2014   Right serous otitis media 08/03/2014   Acute recurrent maxillary sinusitis 10/14/2014   Right-sided low back pain without sciatica 10/14/2014   Sinusitis 02/09/2015   Sacroiliac joint dysfunction of right side 02/25/2015   Piriformis syndrome 03/01/2015   Adhesive capsulitis 07/05/2015   Radiculitis of right cervical region 07/06/2015   Drug reaction 09/25/2016   Mitral valve prolapse 01/10/2018   Past Medical History:  Diagnosis Date   Abnormal Pap smear of cervix    Allergic rhinitis 11/08/2010   Asthma    Herpes simplex of female genitalia    Thyroid nodule    Urticaria      ROS  See HPI.     Objective:    BP 109/62   Pulse 70   Ht 5' 5.5" (1.664 m)   Wt 107 lb (48.5 kg)   SpO2 99%   BMI 17.53 kg/m  BP Readings from Last 3 Encounters:  03/19/22 109/62  08/30/21 (!) 115/57  07/10/21 (!) 112/58   Wt Readings from Last 3 Encounters:  03/19/22 107 lb (48.5 kg)  08/30/21 113 lb (51.3 kg)  07/10/21 113 lb (51.3 kg)      Physical Exam Constitutional:      Appearance: Normal appearance.  HENT:     Head: Normocephalic.     Nose: Nose normal.     Mouth/Throat:     Mouth: Mucous membranes are moist.  Cardiovascular:     Rate and Rhythm: Normal rate and regular rhythm.  Pulmonary:     Effort:  Pulmonary effort is normal.     Breath sounds: Normal breath sounds. No wheezing or rhonchi.  Neurological:     General: No focal deficit present.     Mental Status: She is alert and oriented to person, place, and time.  Psychiatric:        Mood and Affect: Mood normal.         Assessment & Plan:  [name] KitchenMarland KitchenTiny was seen today for cough and hip pain.  Diagnoses and all orders for this visit:  Chronic cough -     Ambulatory referral to Allergy  Left thyroid nodule -     US THYROID; Future  Non-seasonal allergic rhinitis due to pollen -     Ambulatory referral to Allergy  Reactive airway disease without asthma -     Ambulatory referral to Allergy  Chronic cough sounds like could have some allergic variant to it.  Restart zyrtec daily.  Due to ongoing GERD stay  on protonix bid but add pepcid at bedtime.  She has seen pulmonology.  Will make referral to allergist.  Incentive spirometry to help with lung function and coughing  Discussed [place] for follow up thyroid nodule due after 12/22. Ordered and post dated.    [name], PA-C

## 2022-03-19 NOTE — Patient Instructions (Addendum)
Add back zyrtec daily for allergies and cough Pepcid take once a day at bedtime.  Protonix twice a day.   Referral to allergy.

## 2022-03-29 ENCOUNTER — Ambulatory Visit (INDEPENDENT_AMBULATORY_CARE_PROVIDER_SITE_OTHER): Payer: Medicare HMO

## 2022-03-29 DIAGNOSIS — Z1231 Encounter for screening mammogram for malignant neoplasm of breast: Secondary | ICD-10-CM | POA: Diagnosis not present

## 2022-03-30 ENCOUNTER — Telehealth (INDEPENDENT_AMBULATORY_CARE_PROVIDER_SITE_OTHER): Payer: Medicare HMO | Admitting: Psychiatry

## 2022-03-30 ENCOUNTER — Other Ambulatory Visit: Payer: Self-pay | Admitting: Physician Assistant

## 2022-03-30 ENCOUNTER — Encounter (HOSPITAL_COMMUNITY): Payer: Self-pay | Admitting: Psychiatry

## 2022-03-30 ENCOUNTER — Other Ambulatory Visit (HOSPITAL_COMMUNITY): Payer: Self-pay | Admitting: Psychiatry

## 2022-03-30 ENCOUNTER — Encounter: Payer: Self-pay | Admitting: Physician Assistant

## 2022-03-30 DIAGNOSIS — R058 Other specified cough: Secondary | ICD-10-CM

## 2022-03-30 DIAGNOSIS — F5102 Adjustment insomnia: Secondary | ICD-10-CM | POA: Diagnosis not present

## 2022-03-30 DIAGNOSIS — M5030 Other cervical disc degeneration, unspecified cervical region: Secondary | ICD-10-CM

## 2022-03-30 DIAGNOSIS — F419 Anxiety disorder, unspecified: Secondary | ICD-10-CM | POA: Diagnosis not present

## 2022-03-30 DIAGNOSIS — F319 Bipolar disorder, unspecified: Secondary | ICD-10-CM | POA: Diagnosis not present

## 2022-03-30 DIAGNOSIS — M5412 Radiculopathy, cervical region: Secondary | ICD-10-CM

## 2022-03-30 MED ORDER — CITALOPRAM HYDROBROMIDE 10 MG PO TABS: ORAL_TABLET | ORAL | 4 refills | Status: DC

## 2022-03-30 MED ORDER — GABAPENTIN 100 MG PO CAPS: ORAL_CAPSULE | ORAL | 1 refills | Status: DC

## 2022-03-30 NOTE — Progress Notes (Signed)
Patient ID: Kellie [name], female   DOB: [birth year], 66 y.o.   MRN: [id number]   [facility] Follow up Outpatient visit  Kellie [name] [id number] 66 y.o.  03/30/2022 9:46 AM  Virtual Visit via Video Note  I connected with Kellie [name] on 03/30/22 at  9:30 AM EDT by a video enabled telemedicine application and verified that I am speaking with the correct person using two identifiers.  Location: Patient: home Provider: home office   I discussed the limitations of evaluation and management by telemedicine and the availability of in person appointments. The patient expressed understanding and agreed to proceed.      I discussed the assessment and treatment plan with the patient. The patient was provided an opportunity to ask questions and all were answered. The patient agreed with the plan and demonstrated an understanding of the instructions.   The patient was advised to call back or seek an in-person evaluation if the symptoms worsen or if the condition fails to improve as anticipated.  I provided 20 minutes of non-face-to-face time during this encounter.   Chief Complaint:    History of Present Illness:  Has been doing fair, some weight gain, gets stressed out at times, dog is helping to keep busy  Gets stressed driving    Anxiety otherwise manageable and support animal has kept her more stable along with medications Feels somewhat tired during the day   Aggravating factor: driving, regular stressors, crowds Modifying factor: family Duration  8 years plus Severity manageable, can get stressed at times  No tremors, no chest pain  Past Psychiatric History/Hospitalization(s) denies  Hospitalization for psychiatric illness: No History of Electroconvulsive Shock Therapy: No Prior Suicide Attempts: No  Medical History; Past Medical History:  Diagnosis Date   Abnormal Pap smear of cervix    Allergic rhinitis 11/08/2010   Anxiety    Asthma     Bronchitis    GERD (gastroesophageal reflux disease)    Herpes simplex of female genitalia    Hiatal hernia    Hyperlipidemia    Mitral valve prolapse    Osteoporosis 11/21/2011   Thyroid nodule    Urticaria     Allergies: Allergies  Allergen Reactions   Codeine Nausea And Vomiting   Hydrocodone Nausea And Vomiting   Morphine Nausea And Vomiting   Abilify [Aripiprazole]     Nausea/vomiting/weakness/shaky   Morphine And Related Nausea And Vomiting   Percocet [Oxycodone-Acetaminophen] Nausea And Vomiting and Other (See Comments)    Patient states it makes blood pressure drop too much   Vicodin [Hydrocodone-Acetaminophen] Nausea And Vomiting    Medications: Outpatient Encounter Medications as of 03/30/2022  Medication Sig   acyclovir (ZOVIRAX) 200 MG capsule TAKE 2 CAPSULES BY MOUTH TWICE DAILY.   albuterol (VENTOLIN HFA) 108 (90 Base) MCG/ACT inhaler Inhale 2 puffs into the lungs every 4 (four) hours as needed for wheezing or shortness of breath.   alendronate (FOSAMAX) 70 MG tablet Take by mouth.   ALPRAZolam (XANAX) 0.5 MG tablet TAKE 1 TABLET ONCE DAILY FOR ANXIETY.   AMBULATORY NON FORMULARY MEDICATION Incentive spirometer use as directed - fax to [facility] [phone]   ascorbic acid (VITAMIN C) 100 MG tablet Take 1 tablet by mouth daily.   atorvastatin (LIPITOR) 10 MG tablet Take 1 tablet by mouth daily.   cetirizine (ZYRTEC) 10 MG tablet Take by mouth.   Cholecalciferol (VITAMIN D) 2000 units CAPS Take by mouth.   citalopram (CELEXA) 10 MG tablet Take  one a day   cyclobenzaprine (FLEXERIL) 10 MG tablet One half to one tab PO qHS   gabapentin (NEURONTIN) 100 MG capsule TAKE 1 CAPSULE BY MOUTH THREE TIMES A DAY.   pantoprazole (PROTONIX) 40 MG tablet TAKE (1) TABLET BY MOUTH TWICE DAILY.   Probiotic Product (PROBIOTIC & ACIDOPHILUS EX ST PO) Take by mouth.    Spacer/Aero-Holding Chambers (AEROCHAMBER MV) inhaler Use as instructed   [DISCONTINUED]  citalopram (CELEXA) 10 MG tablet Take one a day   [DISCONTINUED] gabapentin (NEURONTIN) 100 MG capsule TAKE 1 CAPSULE BY MOUTH THREE TIMES A DAY.   No facility-administered encounter medications on file as of 03/30/2022.     Family History; Family History  Problem Relation Age of Onset   Kidney cancer Mother 61   COPD Mother    Kidney disease Mother    Allergic rhinitis Mother    Asthma Mother    Heart disease Father    Allergic rhinitis Father    COPD Sister    Depression Sister    Pancreatic cancer Sister    Hypothyroidism Sister    Hypothyroidism Sister    Heart disease Brother    Heart attack Brother    Depression Maternal Aunt    Heart disease Paternal Uncle    Depression Maternal Grandfather    Diabetes Other        neice   Thyroid disease Other        neice   Colon cancer Neg Hx    Rectal cancer Neg Hx    Stomach cancer Neg Hx    Colon polyps Neg Hx    Esophageal cancer Neg Hx        Labs:  No results found for this or any previous visit (from the past 2160 hour(s)).     Mental Status Examination;   Psychiatric Specialty Exam: Physical Exam  Review of Systems  Cardiovascular:  Negative for chest pain.  Psychiatric/Behavioral:  Negative for depression and suicidal ideas. The patient is nervous/anxious and has insomnia.     There were no vitals taken for this visit.There is no height or weight on file to calculate BMI.  General Appearance: casual  Eye Contact::  fair  Speech:  coherent  Volume:  Normal  Mood: fair  Affect:   Thought Process: clear . No psychosis  Orientation:  Full (Time, Place, and Person)  Thought Content:  Rumination  Suicidal Thoughts:  No  Homicidal Thoughts:  No  Memory:  Immediate;   Fair Recent;   Fair  Judgement:  Fair  Insight:  Shallow  Psychomotor Activity:  Increased  Concentration:  Fair  Recall:  Fair  Akathisia:  Negative  Handed:  Right  AIMS (if indicated):     Assets:  Desire for  Improvement Physical Health Transportation  Sleep:        Assessment: Axis I: bipolar disorder II unspecified or depressed type. Anxiety disorder NOS. Insomnia  Axis II: deferred  Axis III:  Past Medical History:  Diagnosis Date   Abnormal Pap smear of cervix    Allergic rhinitis 11/08/2010   Anxiety    Asthma    Bronchitis    GERD (gastroesophageal reflux disease)    Herpes simplex of female genitalia    Hiatal hernia    Hyperlipidemia    Mitral valve prolapse    Osteoporosis 11/21/2011   Thyroid nodule    Urticaria     Axis IV: psychosocial   Treatment Plan and Summary:  Prior documentation reviewed  Bipolar : baseline, continue gabapentin can take one during day and 2 at night if it makes her tired during the day    GAD:  fluctuates, overall gaba helps along with xanax  Continue celexa medication , work on ME time and distraction   Insomnia:manageable, takes xanax and gabapentin at night Having a dog has helped adjust sleep hours as well  Fu 3mRefills due were sent Coded: complexity NMerian Capron MD 03/30/2022

## 2022-04-02 IMAGING — MG DIGITAL SCREENING BILAT W/ TOMO W/ CAD
8 series · 8 of 24 positions shown · non-contrast
Comparison: Previous exam(s).

CLINICAL DATA: Screening.

EXAM:
DIGITAL SCREENING BILATERAL MAMMOGRAM WITH TOMO AND CAD

[R CC synth-2D]
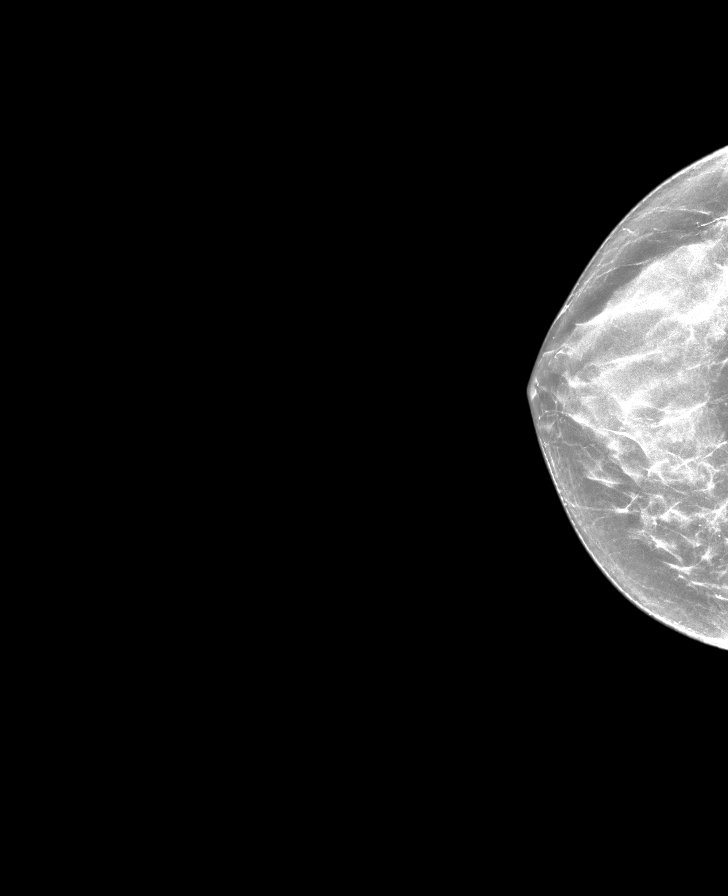

[R MLO synth-2D]
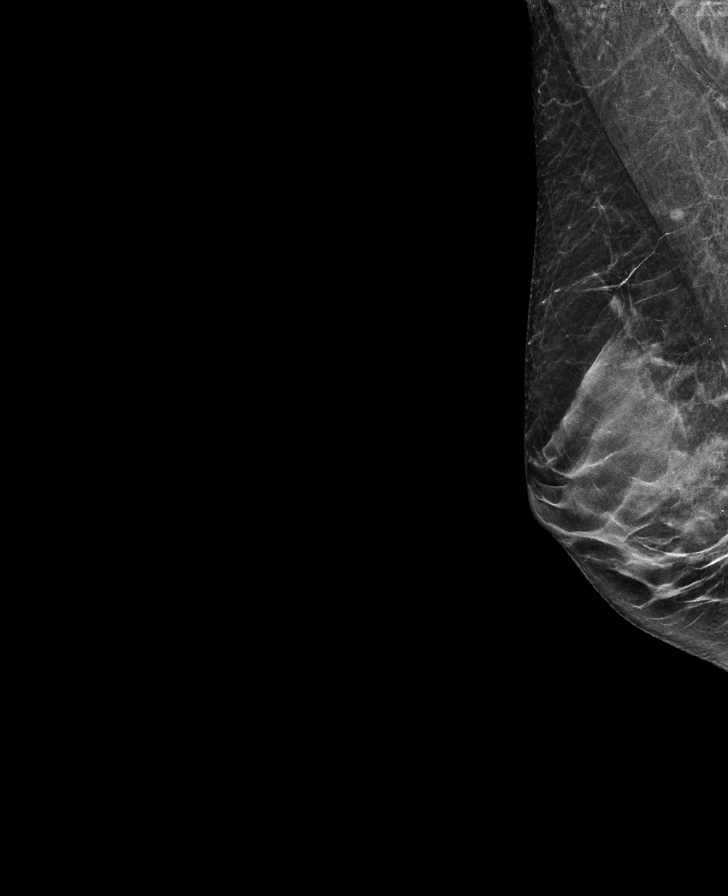

[L MLO synth-2D]
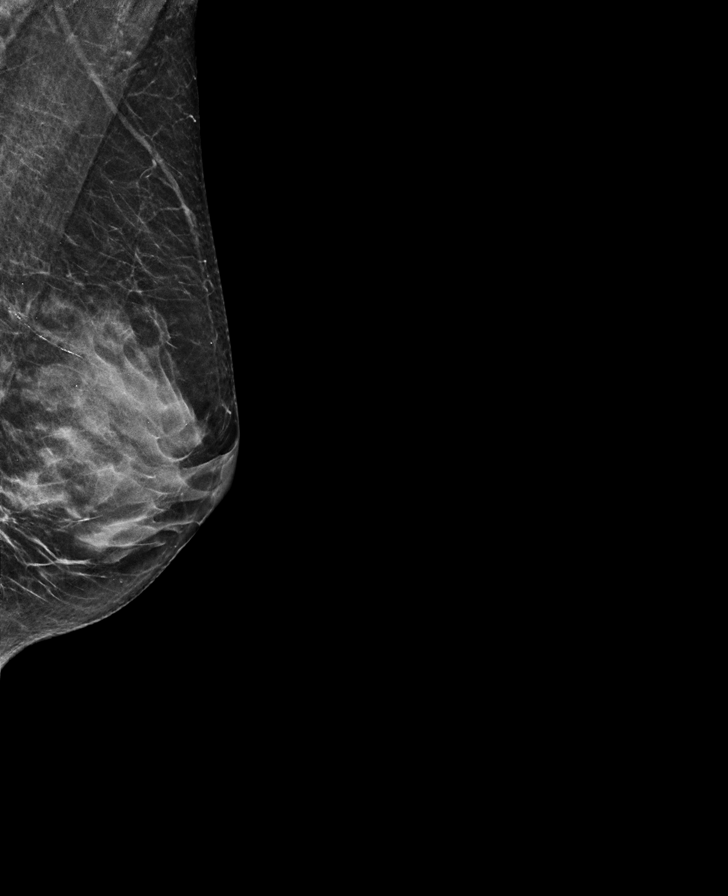

[L CC synth-2D]
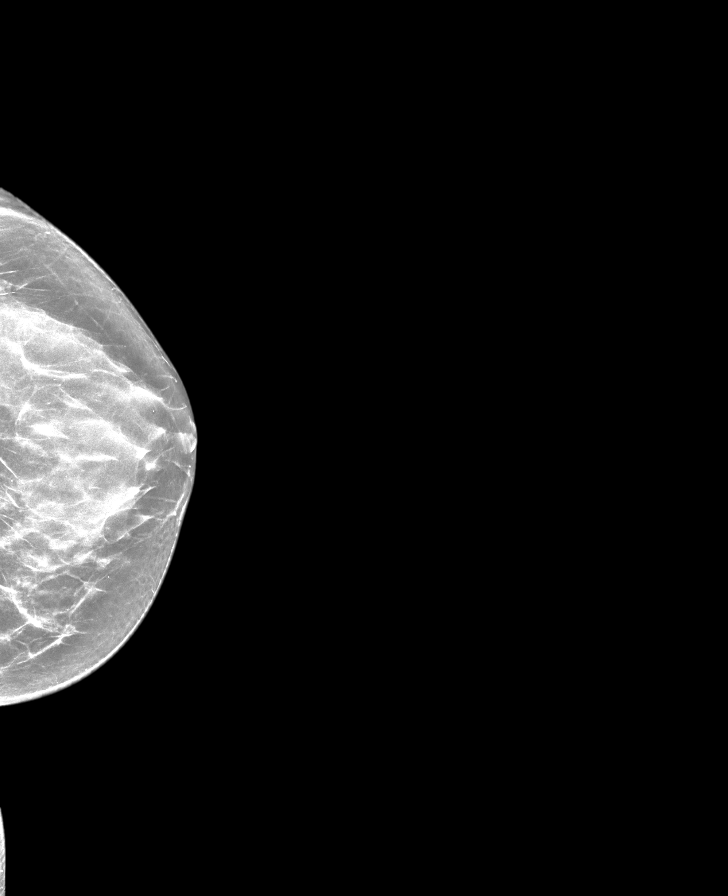

[R CC tomo · tomo slice 29/58.0]
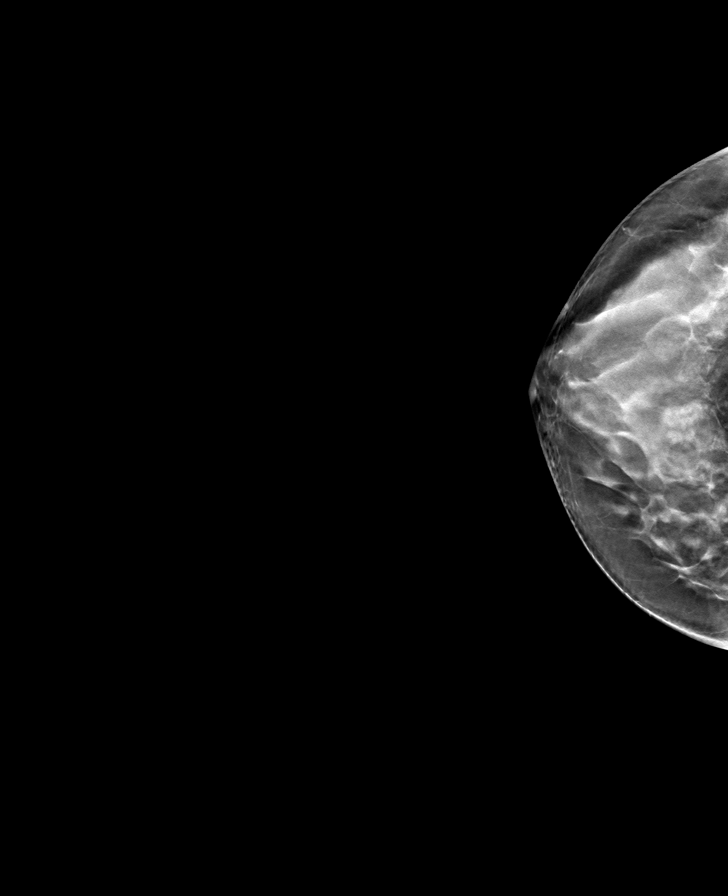

[L MLO tomo · tomo slice 27/52.0]
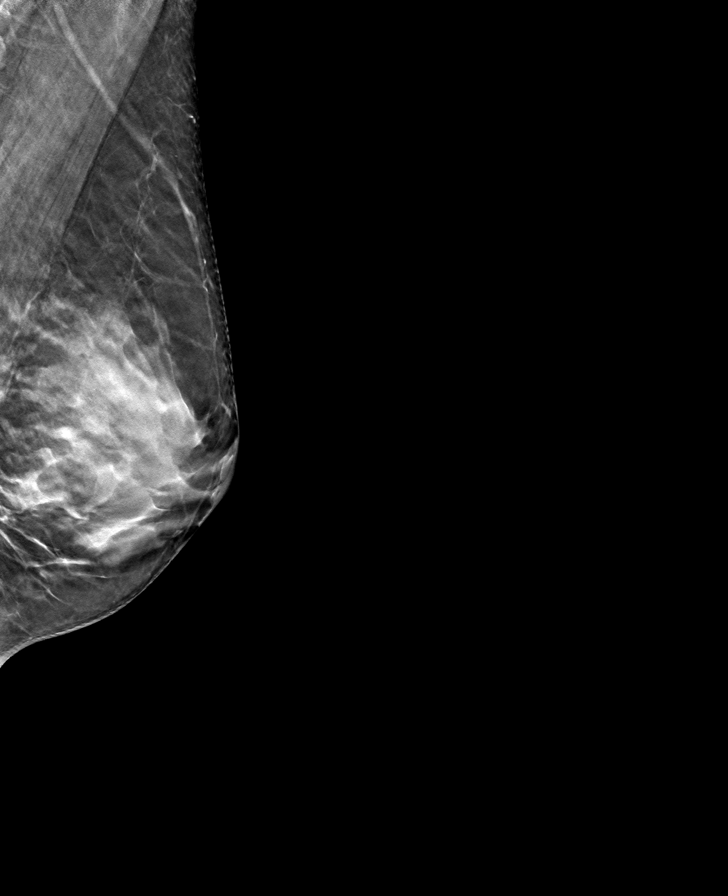

[R MLO tomo · tomo slice 25/50.0]
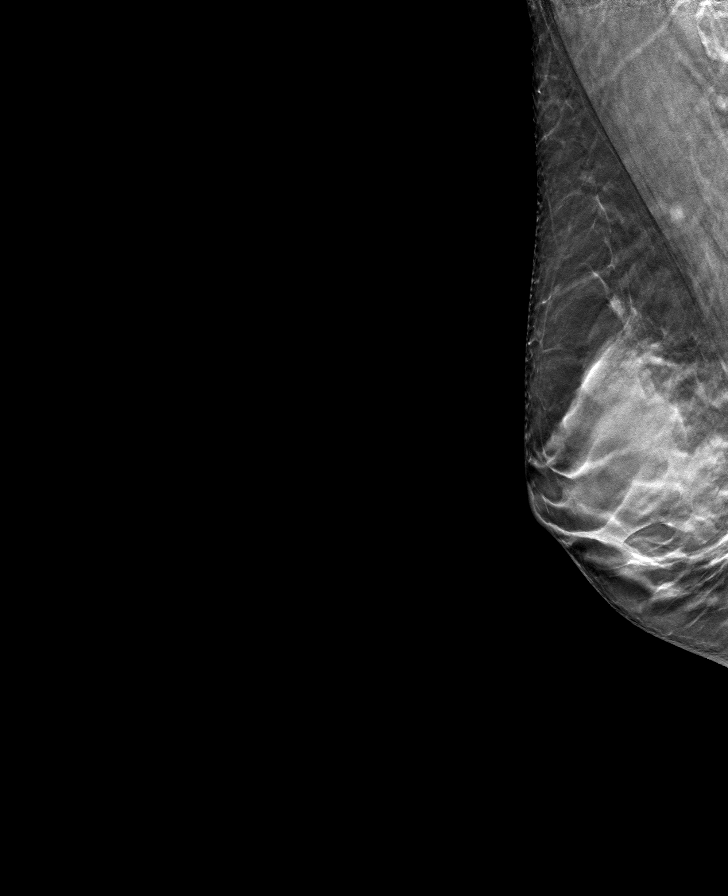

[L CC tomo · tomo slice 25/50.0]
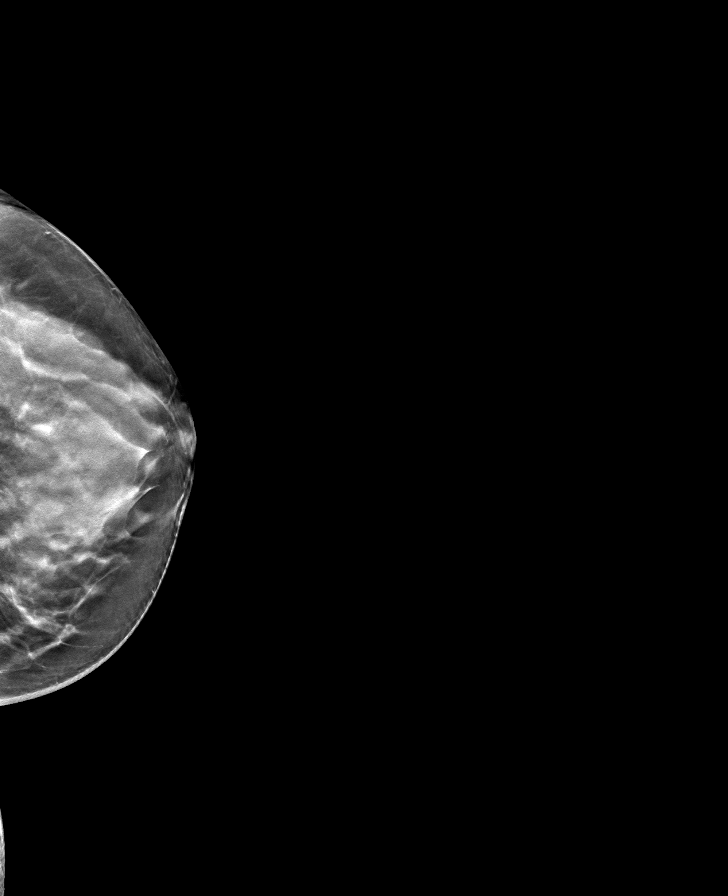

[8 of 24 positions shown; findings below may reference images not displayed]

ACR Breast Density Category c: The breast tissue is heterogeneously
dense, which may obscure small masses.
FINDINGS: There are no findings suspicious for malignancy. Images were
processed with CAD.
IMPRESSION: No mammographic evidence of malignancy. A result letter of this
screening mammogram will be mailed directly to the patient.

RECOMMENDATION:
Screening mammogram in one year. (Code:[id number])

BI-RADS CATEGORY  1: Negative.

## 2022-04-02 MED ORDER — FAMOTIDINE 20 MG PO TABS: 20.0000 mg | ORAL_TABLET | Freq: Every day | ORAL | 3 refills | Status: DC

## 2022-04-04 ENCOUNTER — Encounter: Payer: Self-pay | Admitting: Physician Assistant

## 2022-05-28 ENCOUNTER — Other Ambulatory Visit: Payer: Self-pay | Admitting: Physician Assistant

## 2022-05-28 DIAGNOSIS — F418 Other specified anxiety disorders: Secondary | ICD-10-CM

## 2022-05-28 DIAGNOSIS — F419 Anxiety disorder, unspecified: Secondary | ICD-10-CM

## 2022-05-28 NOTE — Telephone Encounter (Signed)
Last appt 03/19/2022  Last written 02/27/2022 #30 with 2 refills

## 2022-06-20 ENCOUNTER — Ambulatory Visit (INDEPENDENT_AMBULATORY_CARE_PROVIDER_SITE_OTHER): Payer: Medicare HMO

## 2022-06-20 ENCOUNTER — Encounter: Payer: Self-pay | Admitting: Physician Assistant

## 2022-06-20 DIAGNOSIS — E041 Nontoxic single thyroid nodule: Secondary | ICD-10-CM

## 2022-06-20 NOTE — Progress Notes (Signed)
1 year stability of thyroid nodule. Annual survellience until 5 years of stability confirmed.

## 2022-06-26 ENCOUNTER — Telehealth (INDEPENDENT_AMBULATORY_CARE_PROVIDER_SITE_OTHER): Payer: Medicare HMO | Admitting: Physician Assistant

## 2022-06-26 ENCOUNTER — Encounter: Payer: Self-pay | Admitting: Physician Assistant

## 2022-06-26 DIAGNOSIS — E782 Mixed hyperlipidemia: Secondary | ICD-10-CM

## 2022-06-26 DIAGNOSIS — E041 Nontoxic single thyroid nodule: Secondary | ICD-10-CM

## 2022-06-26 NOTE — Progress Notes (Signed)
..  Virtual Visit via Video Note  I connected with Kellie [name] on 06/26/22 at  1:00 PM EST by a video enabled telemedicine application and verified that I am speaking with the correct person using two identifiers.  Location: Patient: car Provider: clinic   .[name] KitchenParticipating in visit:  Patient: Kellie [name] Provider: [name] PA-C  I discussed the limitations of evaluation and management by telemedicine and the availability of in person appointments. The patient expressed understanding and agreed to proceed.  History of Present Illness: Pt would like to discuss thyroid ultrasound and get CT calcium score re-ordered.    Observations/Objective: No acute distress   Assessment and Plan: [name] KitchenMarland KitchenDiagnoses and all orders for this visit:  Left thyroid nodule  Mixed hyperlipidemia -     CT CARDIAC SCORING (SELF PAY ONLY)   Discussed 1 year of stability and we recheck annually for 5 years.   CT calcium score has been ordered but would like it ordered in my name so I get results and so that she can get down in [place]. Ordered placed Not currently on statin  Follow Up Instructions:    I discussed the assessment and treatment plan with the patient. The patient was provided an opportunity to ask questions and all were answered. The patient agreed with the plan and demonstrated an understanding of the instructions.   The patient was advised to call back or seek an in-person evaluation if the symptoms worsen or if the condition fails to improve as anticipated.   [name], PA-C

## 2022-06-27 ENCOUNTER — Other Ambulatory Visit: Payer: Self-pay | Admitting: Physician Assistant

## 2022-06-27 ENCOUNTER — Other Ambulatory Visit (HOSPITAL_COMMUNITY): Payer: Self-pay | Admitting: Psychiatry

## 2022-06-27 DIAGNOSIS — F419 Anxiety disorder, unspecified: Secondary | ICD-10-CM

## 2022-06-27 DIAGNOSIS — M5030 Other cervical disc degeneration, unspecified cervical region: Secondary | ICD-10-CM

## 2022-06-27 DIAGNOSIS — F418 Other specified anxiety disorders: Secondary | ICD-10-CM

## 2022-06-27 DIAGNOSIS — M5412 Radiculopathy, cervical region: Secondary | ICD-10-CM

## 2022-06-27 NOTE — Telephone Encounter (Signed)
Last appt yesterday   Last written 05/28/2022 #30 no refills

## 2022-06-28 ENCOUNTER — Other Ambulatory Visit: Payer: Self-pay | Admitting: Physician Assistant

## 2022-06-28 DIAGNOSIS — A6004 Herpesviral vulvovaginitis: Secondary | ICD-10-CM

## 2022-06-28 MED ORDER — ACYCLOVIR 200 MG PO CAPS: 400.0000 mg | ORAL_CAPSULE | Freq: Two times a day (BID) | ORAL | 0 refills | Status: DC

## 2022-06-29 ENCOUNTER — Ambulatory Visit (INDEPENDENT_AMBULATORY_CARE_PROVIDER_SITE_OTHER): Payer: Self-pay

## 2022-06-29 DIAGNOSIS — E782 Mixed hyperlipidemia: Secondary | ICD-10-CM

## 2022-06-29 NOTE — Progress Notes (Signed)
Calcium score is 23 in the left anterior descending artery. Not terrible but enough with age that justifies you staying on a statin(your lipitor) and keeping your LdL under 130.

## 2022-07-06 ENCOUNTER — Telehealth: Payer: Self-pay | Admitting: Physician Assistant

## 2022-07-06 DIAGNOSIS — R350 Frequency of micturition: Secondary | ICD-10-CM

## 2022-07-06 DIAGNOSIS — E041 Nontoxic single thyroid nodule: Secondary | ICD-10-CM

## 2022-07-06 NOTE — Telephone Encounter (Signed)
Can we call and triage this?

## 2022-07-06 NOTE — Telephone Encounter (Signed)
Triage this. Thyroid should not cause urinary symptoms. We can check a urine to look for infection.

## 2022-07-06 NOTE — Telephone Encounter (Signed)
Patient called and ask if you could order labs for her thyroid also she is have frequent trips to the bathroom says 4-5 times a night no pain or burning just more frequent

## 2022-07-06 NOTE — Telephone Encounter (Signed)
Ordered labs per [employer]. Patient advised.

## 2022-07-13 DIAGNOSIS — E041 Nontoxic single thyroid nodule: Secondary | ICD-10-CM | POA: Diagnosis not present

## 2022-07-13 DIAGNOSIS — R350 Frequency of micturition: Secondary | ICD-10-CM | POA: Diagnosis not present

## 2022-07-16 LAB — IODINE, SERUM/PLASMA: Iodine: 69 mcg/L (ref 52–109)

## 2022-07-16 LAB — TSH+FREE T4: TSH W/REFLEX TO FT4: 2.06 mIU/L (ref 0.40–4.50)

## 2022-07-16 LAB — URINE CULTURE
MICRO NUMBER:: 14448163
Result:: NO GROWTH
SPECIMEN QUALITY:: ADEQUATE

## 2022-07-16 NOTE — Progress Notes (Signed)
No growth on urine culture.  TSH looks great.  Iodine looks great.

## 2022-07-19 ENCOUNTER — Encounter: Payer: Self-pay | Admitting: Physician Assistant

## 2022-07-23 ENCOUNTER — Ambulatory Visit: Payer: Medicare HMO | Admitting: Physician Assistant

## 2022-07-30 ENCOUNTER — Other Ambulatory Visit: Payer: Self-pay | Admitting: Physician Assistant

## 2022-07-30 ENCOUNTER — Other Ambulatory Visit (HOSPITAL_COMMUNITY): Payer: Self-pay | Admitting: Psychiatry

## 2022-07-30 DIAGNOSIS — M5412 Radiculopathy, cervical region: Secondary | ICD-10-CM

## 2022-07-30 DIAGNOSIS — M5030 Other cervical disc degeneration, unspecified cervical region: Secondary | ICD-10-CM

## 2022-07-30 DIAGNOSIS — A6004 Herpesviral vulvovaginitis: Secondary | ICD-10-CM

## 2022-08-03 ENCOUNTER — Ambulatory Visit (INDEPENDENT_AMBULATORY_CARE_PROVIDER_SITE_OTHER): Payer: Medicare HMO | Admitting: Physician Assistant

## 2022-08-03 DIAGNOSIS — Z1211 Encounter for screening for malignant neoplasm of colon: Secondary | ICD-10-CM

## 2022-08-03 DIAGNOSIS — Z0000 Encounter for general adult medical examination without abnormal findings: Secondary | ICD-10-CM | POA: Diagnosis not present

## 2022-08-03 NOTE — Progress Notes (Signed)
MEDICARE ANNUAL WELLNESS VISIT  08/03/2022  Telephone Visit Disclaimer This Medicare AWV was conducted by telephone due to national recommendations for restrictions regarding the COVID-19 Pandemic (e.g. social distancing).  I verified, using two identifiers, that I am speaking with Kellie [name] or their authorized healthcare agent. I discussed the limitations, risks, security, and privacy concerns of performing an evaluation and management service by telephone and the potential availability of an in-person appointment in the future. The patient expressed understanding and agreed to proceed.  Location of Patient: Home Location of Provider (nurse):  In the office.  Subjective:    Kellie [name] is a 67 y.o. female patient of Alden Hipp, Royetta Car, PA-C who had a Medicare Annual Wellness Visit today via telephone. Kellie [name] is Retired and lives alone. she has 1 child. she reports that she is socially active and does interact with friends/family regularly. she is moderately physically active and enjoys shopping.  Patient Care Team: Lavada Mesi as PCP - General (Family Medicine)     08/03/2022    8:52 AM 11/14/2020    2:11 PM 08/06/2019    7:36 AM 03/23/2019    9:35 AM 03/20/2018    8:49 AM 02/08/2018    5:39 AM 11/27/2017   10:27 AM  Advanced Directives  Does Patient Have a Medical Advance Directive? No No No No No No No  Would patient like information on creating a medical advance directive? No - Patient declined  No - Patient declined No - Patient declined No - Patient declined No - Patient declined No - Patient declined    Hospital Utilization Over the Past 12 Months: # of hospitalizations or ER visits: 0 # of surgeries: 0  Review of Systems    Patient reports that her overall health is unchanged compared to last year.  History obtained from chart review and the patient  Patient Reported Readings (BP, Pulse, CBG, Weight, etc) none  Pain Assessment Pain : No/denies pain      Current Medications & Allergies (verified) Allergies as of 08/03/2022       Reactions   Codeine Nausea And Vomiting   Hydrocodone Nausea And Vomiting   Morphine Nausea And Vomiting   Abilify [aripiprazole]    Nausea/vomiting/weakness/shaky   Morphine And Related Nausea And Vomiting   Percocet [oxycodone-acetaminophen] Nausea And Vomiting, Other (See Comments)   Patient states it makes blood pressure drop too much   Vicodin [hydrocodone-acetaminophen] Nausea And Vomiting        Medication List        Accurate as of August 03, 2022  9:27 AM. If you have any questions, ask your nurse or doctor.          acyclovir 200 MG capsule Commonly known as: ZOVIRAX TAKE 2 CAPSULES BY MOUTH TWICE DAILY.   AeroChamber MV inhaler Use as instructed   albuterol 108 (90 Base) MCG/ACT inhaler Commonly known as: VENTOLIN HFA Inhale 2 puffs into the lungs every 4 (four) hours as needed for wheezing or shortness of breath.   alendronate 70 MG tablet Commonly known as: FOSAMAX Take by mouth.   ALPRAZolam 0.5 MG tablet Commonly known as: XANAX TAKE 1 TABLET ONCE DAILY FOR ANXIETY.   AMBULATORY NON FORMULARY MEDICATION Incentive spirometer use as directed - fax to [facility] [phone]   ascorbic acid 100 MG tablet Commonly known as: VITAMIN C Take 1 tablet by mouth daily.   atorvastatin 10 MG tablet Commonly known as: LIPITOR Take 1 tablet  by mouth daily.   cetirizine 10 MG tablet Commonly known as: ZYRTEC Take by mouth.   citalopram 10 MG tablet Commonly known as: CELEXA TAKE (1) TABLET BY MOUTH ONCE DAILY.   cyclobenzaprine 10 MG tablet Commonly known as: FLEXERIL One half to one tab PO qHS   famotidine 20 MG tablet Commonly known as: PEPCID Take 1 tablet (20 mg total) by mouth at bedtime.   gabapentin 100 MG capsule Commonly known as: NEURONTIN TAKE 1 CAPSULE BY MOUTH THREE TIMES A DAY.   pantoprazole 40 MG tablet Commonly known as:  PROTONIX TAKE (1) TABLET BY MOUTH TWICE DAILY.   PROBIOTIC & ACIDOPHILUS EX ST PO Take by mouth.   Vitamin D 50 MCG (2000 UT) Caps Take by mouth.        History (reviewed): Past Medical History:  Diagnosis Date   Abnormal Pap smear of cervix    Allergic rhinitis 11/08/2010   Anxiety    Asthma    Bronchitis    GERD (gastroesophageal reflux disease)    Herpes simplex of female genitalia    Hiatal hernia    Hyperlipidemia    Mitral valve prolapse    Osteoporosis 11/21/2011   Thyroid nodule    Urticaria    Past Surgical History:  Procedure Laterality Date   CLOSED MANIPULATION SHOULDER  7&01/2005   x2, 30 days apart   CLOSED MANIPULATION SHOULDER  2006   Left   COLONOSCOPY  09/2012   Medium sized external hemorrhoids, few small divertic in left colon, otherwise normal colon (repeat 10 yrs)   ESOPHAGOGASTRODUODENOSCOPY  09/2012   Normal (Dr. Ardis Hughs)   LEEP  11/2004   Family History  Problem Relation Age of Onset   Kidney cancer Mother 37   COPD Mother    Kidney disease Mother    Allergic rhinitis Mother    Asthma Mother    Heart disease Father    Allergic rhinitis Father    COPD Sister    Depression Sister    Pancreatic cancer Sister    Hypothyroidism Sister    Hypothyroidism Sister    Heart disease Brother    Heart attack Brother    Depression Maternal Aunt    Heart disease Paternal Uncle    Depression Maternal Grandfather    Diabetes Other        neice   Thyroid disease Other        neice   Colon cancer Neg Hx    Rectal cancer Neg Hx    Stomach cancer Neg Hx    Colon polyps Neg Hx    Esophageal cancer Neg Hx    Social History   Socioeconomic History   Marital status: Divorced    Spouse name: Not on file   Number of children: 1   Years of education: 9   Highest education level: 9th grade  Occupational History    Comment: Retired  Tobacco Use   Smoking status: Never   Smokeless tobacco: Never  Vaping Use   Vaping Use: Never used  Substance  and Sexual Activity   Alcohol use: No   Drug use: No   Sexual activity: Not Currently    Birth control/protection: Post-menopausal  Other Topics Concern   Not on file  Social History Narrative   Lives with her dog. Her son lives about 28 miles from her; her sister close by. She enjoys shopping and exercising.   Social Determinants of Health   Financial Resource Strain: Low Risk  (08/03/2022)  Overall Financial Resource Strain (CARDIA)    Difficulty of Paying Living Expenses: Not hard at all  Food Insecurity: No Food Insecurity (08/03/2022)   Hunger Vital Sign    Worried About Running Out of Food in the Last Year: Never true    Ran Out of Food in the Last Year: Never true  Transportation Needs: No Transportation Needs (08/03/2022)   PRAPARE - Hydrologist (Medical): No    Lack of Transportation (Non-Medical): No  Physical Activity: Insufficiently Active (08/03/2022)   Exercise Vital Sign    Days of Exercise per Week: 7 days    Minutes of Exercise per Session: 10 min  Stress: No Stress Concern Present (08/03/2022)   [place]    Feeling of Stress : Not at all  Social Connections: Moderately Isolated (08/03/2022)   Social Connection and Isolation Panel [NHANES]    Frequency of Communication with Friends and Family: More than three times a week    Frequency of Social Gatherings with Friends and Family: Twice a week    Attends Religious Services: More than 4 times per year    Active Member of [employer] or Organizations: No    Attends Archivist Meetings: Never    Marital Status: Divorced    Activities of Daily Living    08/03/2022    8:59 AM  In your present state of health, do you have any difficulty performing the following activities:  Hearing? 0  Vision? 0  Difficulty concentrating or making decisions? 1  Walking or climbing stairs? 0  Dressing or bathing? 0  Doing errands,  shopping? 0  Preparing Food and eating ? N  Using the Toilet? N  In the past six months, have you accidently leaked urine? N  Do you have problems with loss of bowel control? N  Managing your Medications? N  Managing your Finances? N  Housekeeping or managing your Housekeeping? N    Patient Education/ Literacy How often do you need to have someone help you when you read instructions, pamphlets, or other written materials from your doctor or pharmacy?: 1 - Never What is the last grade level you completed in school?: 9th grade  Exercise Current Exercise Habits: Home exercise routine, Type of exercise: stretching;strength training/weights, Time (Minutes): 10, Frequency (Times/Week): 7, Weekly Exercise (Minutes/Week): 70, Intensity: Moderate, Exercise limited by: None identified  Diet Patient reports consuming 3 meals a day and 2 snack(s) a day Patient reports that her primary diet is: Regular Patient reports that she does have regular access to food.   Depression Screen    08/03/2022    8:53 AM 03/19/2022   10:55 AM 02/15/2021   10:14 AM 09/20/2020    8:59 AM 02/15/2020    9:38 AM 02/13/2019    8:40 AM 09/02/2018   10:55 AM  PHQ 2/9 Scores  PHQ - 2 Score 0 3 0 2 4 3 2  $ PHQ- 9 Score  17 4 9 20 9 9     $ Fall Risk    08/03/2022    8:53 AM 02/15/2021   10:13 AM 09/20/2020    8:59 AM 02/15/2020    9:38 AM 09/02/2018   10:55 AM  Fall Risk   Falls in the past year? 0 0 1 0   Number falls in past yr: 0 0 1    Injury with Fall? 0 0 0    Risk for fall due to : No Fall  Risks  History of fall(s)    Follow up Falls evaluation completed  Falls evaluation completed  Falls evaluation completed;Falls prevention discussed     Objective:  Kellie [name] seemed alert and oriented and she participated appropriately during our telephone visit.  Blood Pressure Weight BMI  BP Readings from Last 3 Encounters:  03/19/22 109/62  08/30/21 (!) 115/57  07/10/21 (!) 112/58   Wt Readings from Last 3  Encounters:  03/19/22 107 lb (48.5 kg)  08/30/21 113 lb (51.3 kg)  07/10/21 113 lb (51.3 kg)   BMI Readings from Last 1 Encounters:  03/19/22 17.53 kg/m    *Unable to obtain current vital signs, weight, and BMI due to telephone visit type  Hearing/Vision  Kellie [name] did not seem to have difficulty with hearing/understanding during the telephone conversation Reports that she has had a formal eye exam by an eye care professional within the past year Reports that she has not had a formal hearing evaluation within the past year *Unable to fully assess hearing and vision during telephone visit type  Cognitive Function:    08/03/2022    9:03 AM 02/15/2021    9:44 AM  6CIT Screen  What Year? 0 points 0 points  What month? 0 points 0 points  What time? 0 points 0 points  Count back from 20 0 points 0 points  Months in reverse 2 points 0 points  Repeat phrase 0 points 4 points  Total Score 2 points 4 points   (Normal:0-7, Significant for Dysfunction: >8)  Normal Cognitive Function Screening: Yes   Immunization & Health Maintenance Record Immunization History  Administered Date(s) Administered   Tdap 06/25/2005    Health Maintenance  Topic Date Due   DTaP/Tdap/Td (2 - Td or Tdap) 06/26/2015   INFLUENZA VACCINE  09/23/2022 (Originally 01/23/2022)   Zoster Vaccines- Shingrix (1 of 2) 11/01/2022 (Originally 11/30/1973)   Pneumonia Vaccine 1+ Years old (1 of 1 - PCV) 08/04/2023 (Originally 12/01/2019)   COLONOSCOPY (Pts 45-40yr Insurance coverage will need to be confirmed)  10/16/2022   DEXA SCAN  03/02/2023   MAMMOGRAM  03/30/2023   Medicare Annual Wellness (AWV)  08/04/2023   Hepatitis C Screening  Completed   HPV VACCINES  Aged Out   COVID-19 Vaccine  Discontinued       Assessment  This is a routine wellness examination for Kellie [name]  Health Maintenance: Due or Overdue Health Maintenance Due  Topic Date Due   DTaP/Tdap/Td (2 - Td or Tdap) 06/26/2015    SRutherford [name] does not need a referral for Community Assistance: Care Management:   no Social Work:    no Prescription Assistance:  no Nutrition/Diabetes Education:  no   Plan:  Personalized Goals  Goals Addressed               This Visit's Progress     Patient Stated (pt-stated)        Patient stated that she would like to work on their strengthen her muscles and get her LDL lower.       Personalized Health Maintenance & Screening Recommendations  Colorectal cancer screening Flu vaccine, Shingles vaccine, TD vaccine and Pneumonia vaccine Bone density scan due in September, 2024 Mammogram due in October, 2024  Patient declined the vaccines at this time.  Lung Cancer Screening Recommended: no (Low Dose CT Chest recommended if Age 10640-80years, 30 pack-year currently smoking OR have quit w/in past 15 years) Hepatitis C Screening recommended: no HIV Screening  recommended: no  Advanced Directives: Written information was not prepared per patient's request.  Referrals & Orders Orders Placed This Encounter  Procedures   Ambulatory referral to Gastroenterology (for Colonoscopy)    Follow-up Plan Follow-up with Donella Stade, PA-C as planned Schedule bone density and mammogram for October.  Colon cancer screening referral has been sent. Due in April. Medicare wellness visit in one year.  Patient will access AVS on my chart.   I have personally reviewed and noted the following in the patient's chart:   Medical and social history Use of alcohol, tobacco or illicit drugs  Current medications and supplements Functional ability and status Nutritional status Physical activity Advanced directives List of other physicians Hospitalizations, surgeries, and ER visits in previous 12 months Vitals Screenings to include cognitive, depression, and falls Referrals and appointments  In addition, I have reviewed and discussed with Kellie [name] certain preventive protocols, quality  metrics, and best practice recommendations. A written personalized care plan for preventive services as well as general preventive health recommendations is available and can be mailed to the patient at her request.      Tinnie Gens, RN BSN  08/03/2022

## 2022-08-03 NOTE — Patient Instructions (Addendum)
[address] Maintenance Summary and Written Plan of Care  Kellie [name] ,  Thank you for allowing me to perform your Medicare Annual Wellness Visit and for your ongoing commitment to your health.   Health Maintenance & Immunization History Health Maintenance  Topic Date Due   DTaP/Tdap/Td (2 - Td or Tdap) 06/26/2015   INFLUENZA VACCINE  09/23/2022 (Originally 01/23/2022)   Zoster Vaccines- Shingrix (1 of 2) 11/01/2022 (Originally 11/30/1973)   Pneumonia Vaccine 73+ Years old (1 of 1 - PCV) 08/04/2023 (Originally 12/01/2019)   COLONOSCOPY (Pts 45-52yr Insurance coverage will need to be confirmed)  10/16/2022   DEXA SCAN  03/02/2023   MAMMOGRAM  03/30/2023   Medicare Annual Wellness (AWV)  08/04/2023   Hepatitis C Screening  Completed   HPV VACCINES  Aged Out   COVID-19 Vaccine  Discontinued   Immunization History  Administered Date(s) Administered   Tdap 06/25/2005    These are the patient goals that we discussed:  Goals Addressed               This Visit's Progress     Patient Stated (pt-stated)        Patient stated that she would like to work on their strengthen her muscles and get her LDL lower.         This is a list of Health Maintenance Items that are overdue or due now: Health Maintenance Due  Topic Date Due   DTaP/Tdap/Td (2 - Td or Tdap) 06/26/2015   Colorectal cancer screening Flu vaccine, Shingles vaccine, TD vaccine and Pneumonia vaccine Bone density scan due in September, 2024 Mammogram due in October, 2024  Patient declined the vaccines at this time.  Orders/Referrals Placed Today: Orders Placed This Encounter  Procedures   Ambulatory referral to Gastroenterology (for Colonoscopy)    Referral Priority:   Routine    Referral Type:   Consultation    Referral Reason:   Specialty Services Required    Number of Visits Requested:   1    (Contact our referral department at [phone] you have not spoken with someone about  your referral appointment within the next 5 days)    Follow-up Plan Follow-up with BDonella Stade PA-C as planned Schedule bone density and mammogram for October.  Colon cancer screening referral has been sent. Due in April. Medicare wellness visit in one year.  Patient will access AVS on my chart.      Health Maintenance, Female Adopting a healthy lifestyle and getting preventive care are important in promoting health and wellness. Ask your health care provider about: The right schedule for you to have regular tests and exams. Things you can do on your own to prevent diseases and keep yourself healthy. What should I know about diet, weight, and exercise? Eat a healthy diet  Eat a diet that includes plenty of vegetables, fruits, low-fat dairy products, and lean protein. Do not eat a lot of foods that are high in solid fats, added sugars, or sodium. Maintain a healthy weight Body mass index (BMI) is used to identify weight problems. It estimates body fat based on height and weight. Your health care provider can help determine your BMI and help you achieve or maintain a healthy weight. Get regular exercise Get regular exercise. This is one of the most important things you can do for your health. Most adults should: Exercise for at least 150 minutes each week. The exercise should increase your heart rate and make you  sweat (moderate-intensity exercise). Do strengthening exercises at least twice a week. This is in addition to the moderate-intensity exercise. Spend less time sitting. Even light physical activity can be beneficial. Watch cholesterol and blood lipids Have your blood tested for lipids and cholesterol at 68 years of age, then have this test every 5 years. Have your cholesterol levels checked more often if: Your lipid or cholesterol levels are high. You are older than 68 years of age. You are at high risk for heart disease. What should I know about cancer  screening? Depending on your health history and family history, you may need to have cancer screening at various ages. This may include screening for: Breast cancer. Cervical cancer. Colorectal cancer. Skin cancer. Lung cancer. What should I know about heart disease, diabetes, and high blood pressure? Blood pressure and heart disease High blood pressure causes heart disease and increases the risk of stroke. This is more likely to develop in people who have high blood pressure readings or are overweight. Have your blood pressure checked: Every 3-5 years if you are 50-7 years of age. Every year if you are 69 years old or older. Diabetes Have regular diabetes screenings. This checks your fasting blood sugar level. Have the screening done: Once every three years after age 57 if you are at a normal weight and have a low risk for diabetes. More often and at a younger age if you are overweight or have a high risk for diabetes. What should I know about preventing infection? Hepatitis B If you have a higher risk for hepatitis B, you should be screened for this virus. Talk with your health care provider to find out if you are at risk for hepatitis B infection. Hepatitis C Testing is recommended for: Everyone born from [birth year] through 1966. Anyone with known risk factors for hepatitis C. Sexually transmitted infections (STIs) Get screened for STIs, including gonorrhea and chlamydia, if: You are sexually active and are younger than 68 years of age. You are older than 68 years of age and your health care provider tells you that you are at risk for this type of infection. Your sexual activity has changed since you were last screened, and you are at increased risk for chlamydia or gonorrhea. Ask your health care provider if you are at risk. Ask your health care provider about whether you are at high risk for HIV. Your health care provider may recommend a prescription medicine to help prevent HIV  infection. If you choose to take medicine to prevent HIV, you should first get tested for HIV. You should then be tested every 3 months for as long as you are taking the medicine. Pregnancy If you are about to stop having your period (premenopausal) and you may become pregnant, seek counseling before you get pregnant. Take 400 to 800 micrograms (mcg) of folic acid every day if you become pregnant. Ask for birth control (contraception) if you want to prevent pregnancy. Osteoporosis and menopause Osteoporosis is a disease in which the bones lose minerals and strength with aging. This can result in bone fractures. If you are 84 years old or older, or if you are at risk for osteoporosis and fractures, ask your health care provider if you should: Be screened for bone loss. Take a calcium or vitamin D supplement to lower your risk of fractures. Be given hormone replacement therapy (HRT) to treat symptoms of menopause. Follow these instructions at home: Alcohol use Do not drink alcohol if: Your health care provider  tells you not to drink. You are pregnant, may be pregnant, or are planning to become pregnant. If you drink alcohol: Limit how much you have to: 0-1 drink a day. Know how much alcohol is in your drink. In the [place] one drink equals one 12 oz bottle of beer (355 mL), one 5 oz glass of wine (148 mL), or one 1 oz glass of hard liquor (44 mL). Lifestyle Do not use any products that contain nicotine or tobacco. These products include cigarettes, chewing tobacco, and vaping devices, such as e-cigarettes. If you need help quitting, ask your health care provider. Do not use street drugs. Do not share needles. Ask your health care provider for help if you need support or information about quitting drugs. General instructions Schedule regular health, dental, and eye exams. Stay current with your vaccines. Tell your health care provider if: You often feel depressed. You have ever been abused  or do not feel safe at home. Summary Adopting a healthy lifestyle and getting preventive care are important in promoting health and wellness. Follow your health care provider's instructions about healthy diet, exercising, and getting tested or screened for diseases. Follow your health care provider's instructions on monitoring your cholesterol and blood pressure. This information is not intended to replace advice given to you by your health care provider. Make sure you discuss any questions you have with your health care provider. Document Revised: 10/31/2020 Document Reviewed: 10/31/2020 Elsevier Patient Education  [place].

## 2022-08-23 ENCOUNTER — Encounter: Payer: Self-pay | Admitting: Radiology

## 2022-08-27 ENCOUNTER — Other Ambulatory Visit (HOSPITAL_COMMUNITY): Payer: Self-pay | Admitting: Psychiatry

## 2022-08-27 DIAGNOSIS — M5030 Other cervical disc degeneration, unspecified cervical region: Secondary | ICD-10-CM

## 2022-08-27 DIAGNOSIS — M5412 Radiculopathy, cervical region: Secondary | ICD-10-CM

## 2022-09-18 DIAGNOSIS — H524 Presbyopia: Secondary | ICD-10-CM | POA: Diagnosis not present

## 2022-09-18 DIAGNOSIS — H43813 Vitreous degeneration, bilateral: Secondary | ICD-10-CM | POA: Diagnosis not present

## 2022-09-18 DIAGNOSIS — H02831 Dermatochalasis of right upper eyelid: Secondary | ICD-10-CM | POA: Diagnosis not present

## 2022-09-18 DIAGNOSIS — H11153 Pinguecula, bilateral: Secondary | ICD-10-CM | POA: Diagnosis not present

## 2022-09-18 DIAGNOSIS — H2513 Age-related nuclear cataract, bilateral: Secondary | ICD-10-CM | POA: Diagnosis not present

## 2022-09-18 DIAGNOSIS — H52203 Unspecified astigmatism, bilateral: Secondary | ICD-10-CM | POA: Diagnosis not present

## 2022-09-18 DIAGNOSIS — H25013 Cortical age-related cataract, bilateral: Secondary | ICD-10-CM | POA: Diagnosis not present

## 2022-09-18 DIAGNOSIS — H5203 Hypermetropia, bilateral: Secondary | ICD-10-CM | POA: Diagnosis not present

## 2022-09-18 DIAGNOSIS — H04123 Dry eye syndrome of bilateral lacrimal glands: Secondary | ICD-10-CM | POA: Diagnosis not present

## 2022-09-20 ENCOUNTER — Institutional Professional Consult (permissible substitution): Payer: Medicare HMO | Admitting: Pulmonary Disease

## 2022-09-25 ENCOUNTER — Telehealth: Payer: Self-pay | Admitting: Physician Assistant

## 2022-09-25 NOTE — Telephone Encounter (Signed)
-----   Message from Donella Stade, [place] sent at 06/20/2021  1:52 PM EST ----- US thyroid repeat 1cm left thyroid nodule follow up

## 2022-09-27 ENCOUNTER — Other Ambulatory Visit (HOSPITAL_COMMUNITY): Payer: Self-pay | Admitting: Psychiatry

## 2022-09-27 ENCOUNTER — Other Ambulatory Visit: Payer: Self-pay | Admitting: Physician Assistant

## 2022-09-27 DIAGNOSIS — M5412 Radiculopathy, cervical region: Secondary | ICD-10-CM

## 2022-09-27 DIAGNOSIS — M5030 Other cervical disc degeneration, unspecified cervical region: Secondary | ICD-10-CM

## 2022-09-27 DIAGNOSIS — F419 Anxiety disorder, unspecified: Secondary | ICD-10-CM

## 2022-09-27 DIAGNOSIS — R4589 Other symptoms and signs involving emotional state: Secondary | ICD-10-CM

## 2022-09-28 ENCOUNTER — Encounter (HOSPITAL_COMMUNITY): Payer: Self-pay | Admitting: Psychiatry

## 2022-09-28 ENCOUNTER — Telehealth (INDEPENDENT_AMBULATORY_CARE_PROVIDER_SITE_OTHER): Payer: Medicare HMO | Admitting: Psychiatry

## 2022-09-28 DIAGNOSIS — F319 Bipolar disorder, unspecified: Secondary | ICD-10-CM

## 2022-09-28 DIAGNOSIS — F419 Anxiety disorder, unspecified: Secondary | ICD-10-CM

## 2022-09-28 DIAGNOSIS — F5102 Adjustment insomnia: Secondary | ICD-10-CM | POA: Diagnosis not present

## 2022-09-28 NOTE — Progress Notes (Signed)
Patient ID: Kellie [name], female   DOB: 06/10/1956, 67 y.o.   MRN: [id number]   [facility]  Kellie [name] [id number] 67 y.o.  09/28/2022 9:22 AM  Virtual Visit via Video Note  I connected with Kellie [name] on 09/28/22 at  9:00 AM EDT by a video enabled telemedicine application and verified that I am speaking with the correct person using two identifiers.  Location: Patient: home Provider: home office   I discussed the limitations of evaluation and management by telemedicine and the availability of in person appointments. The patient expressed understanding and agreed to proceed.      I discussed the assessment and treatment plan with the patient. The patient was provided an opportunity to ask questions and all were answered. The patient agreed with the plan and demonstrated an understanding of the instructions.   The patient was advised to call back or seek an in-person evaluation if the symptoms worsen or if the condition fails to improve as anticipated.  I provided 20 minutes of non-face-to-face time during this encounter.     Chief Complaint:    History of Present Illness:   Doing better, does not let other people bother her. Sister lives near by  dog is helping to keep busy and active  Gets stressed driving  Weight some better   Aggravating factor: driving, regular stressors, crowds Modifying factor: family Duration  8 years plus Severity manageable, better  No tremors, no chest pain  Past Psychiatric History/Hospitalization(s) denies  Hospitalization for psychiatric illness: No History of Electroconvulsive Shock Therapy: No Prior Suicide Attempts: No  Medical History; Past Medical History:  Diagnosis Date   Abnormal Pap smear of cervix    Allergic rhinitis 11/08/2010   Anxiety    Asthma    Bronchitis    GERD (gastroesophageal reflux disease)    Herpes simplex of female genitalia    Hiatal hernia     Hyperlipidemia    Mitral valve prolapse    Osteoporosis 11/21/2011   Thyroid nodule    Urticaria     Allergies: Allergies  Allergen Reactions   Codeine Nausea And Vomiting   Hydrocodone Nausea And Vomiting   Morphine Nausea And Vomiting   Abilify [Aripiprazole]     Nausea/vomiting/weakness/shaky   Morphine And Related Nausea And Vomiting   Percocet [Oxycodone-Acetaminophen] Nausea And Vomiting and Other (See Comments)    Patient states it makes blood pressure drop too much   Vicodin [Hydrocodone-Acetaminophen] Nausea And Vomiting    Medications: Outpatient Encounter Medications as of 09/28/2022  Medication Sig   acyclovir (ZOVIRAX) 200 MG capsule TAKE 2 CAPSULES BY MOUTH TWICE DAILY.   albuterol (VENTOLIN HFA) 108 (90 Base) MCG/ACT inhaler Inhale 2 puffs into the lungs every 4 (four) hours as needed for wheezing or shortness of breath.   alendronate (FOSAMAX) 70 MG tablet Take by mouth.   ALPRAZolam (XANAX) 0.5 MG tablet TAKE 1 TABLET ONCE DAILY FOR ANXIETY.   AMBULATORY NON FORMULARY MEDICATION Incentive spirometer use as directed - [facility] [place] [phone]   ascorbic acid (VITAMIN C) 100 MG tablet Take 1 tablet by mouth daily.   atorvastatin (LIPITOR) 10 MG tablet Take 1 tablet by mouth daily.   cetirizine (ZYRTEC) 10 MG tablet Take by mouth.   Cholecalciferol (VITAMIN D) 2000 units CAPS Take by mouth.   citalopram (CELEXA) 10 MG tablet TAKE (1) TABLET BY MOUTH ONCE DAILY.   cyclobenzaprine (FLEXERIL) 10 MG tablet One half to  one tab PO qHS   famotidine (PEPCID) 20 MG tablet Take 1 tablet (20 mg total) by mouth at bedtime.   gabapentin (NEURONTIN) 100 MG capsule TAKE 1 CAPSULE BY MOUTH THREE TIMES A DAY.   pantoprazole (PROTONIX) 40 MG tablet TAKE (1) TABLET BY MOUTH TWICE DAILY.   Probiotic Product (PROBIOTIC & ACIDOPHILUS EX ST PO) Take by mouth.    Spacer/Aero-Holding Chambers (AEROCHAMBER MV) inhaler Use as instructed   No facility-administered  encounter medications on file as of 09/28/2022.     Family History; Family History  Problem Relation Age of Onset   Kidney cancer Mother 60   COPD Mother    Kidney disease Mother    Allergic rhinitis Mother    Asthma Mother    Heart disease Father    Allergic rhinitis Father    COPD Sister    Depression Sister    Pancreatic cancer Sister    Hypothyroidism Sister    Hypothyroidism Sister    Heart disease Brother    Heart attack Brother    Depression Maternal Aunt    Heart disease Paternal Uncle    Depression Maternal Grandfather    Diabetes Other        neice   Thyroid disease Other        neice   Colon cancer Neg Hx    Rectal cancer Neg Hx    Stomach cancer Neg Hx    Colon polyps Neg Hx    Esophageal cancer Neg Hx        Labs:  Recent Results (from the past 2160 hour(s))  Urine Culture     Status: None   Collection Time: 07/13/22  9:11 AM   Specimen: Urine  Result Value Ref Range   MICRO NUMBER: [id number]    SPECIMEN QUALITY: Adequate    Sample Source URINE, CLEAN CATCH    STATUS: FINAL    Result: No Growth   TSH + free T4     Status: None   Collection Time: 07/13/22  9:11 AM  Result Value Ref Range   TSH W/REFLEX TO FT4 2.06 0.40 - 4.50 mIU/L  Iodine, Serum/Plasma     Status: None   Collection Time: 07/13/22  9:11 AM  Result Value Ref Range   Iodine 69 52 - 109 mcg/L    Comment: . This test was developed and its analytical performance characteristics have been determined by [facility] [place], [place]. It has not been cleared or approved by the U.S. Food and Drug Administration. This assay has been validated pursuant to the CLIA regulations and is used for clinical purposes. .        Mental Status Examination;   Psychiatric Specialty Exam: Physical Exam  Review of Systems  Cardiovascular:  Negative for chest pain.  Psychiatric/Behavioral:  Negative for depression and suicidal ideas. The patient is nervous/anxious and  has insomnia.     There were no vitals taken for this visit.There is no height or weight on file to calculate BMI.  General Appearance: casual  Eye Contact::  fair  Speech:  coherent  Volume:  Normal  Mood: fair  Affect:   Thought Process: clear . No psychosis  Orientation:  Full (Time, Place, and Person)  Thought Content:  Rumination  Suicidal Thoughts:  No  Homicidal Thoughts:  No  Memory:  Immediate;   Fair Recent;   Fair  Judgement:  Fair  Insight:  Shallow  Psychomotor Activity:  Increased  Concentration:  Fair  Recall:  Fair  Akathisia:  Negative  Handed:  Right  AIMS (if indicated):     Assets:  Desire for Improvement Physical Health Transportation  Sleep:        Assessment: Axis I: bipolar disorder II unspecified or depressed type. Anxiety disorder NOS. Insomnia  Axis II: deferred  Axis III:  Past Medical History:  Diagnosis Date   Abnormal Pap smear of cervix    Allergic rhinitis 11/08/2010   Anxiety    Asthma    Bronchitis    GERD (gastroesophageal reflux disease)    Herpes simplex of female genitalia    Hiatal hernia    Hyperlipidemia    Mitral valve prolapse    Osteoporosis 11/21/2011   Thyroid nodule    Urticaria     Axis IV: psychosocial   Treatment Plan and Summary:  Prior documentation reviewed   Bipolar : baseline, doing better, continue gaba   GAD:  manageabe with celexa and gabapentin, also on xanax from pcp  Continue celexa medication , work on ME time and distraction   Insomnia: manageable takes xanax , continue sleep hygiene  Fu [id number] Refills due were sent  Thresa RossNadeem Lainy Wrobleski, MD 09/28/2022

## 2022-10-01 DIAGNOSIS — H524 Presbyopia: Secondary | ICD-10-CM | POA: Diagnosis not present

## 2022-10-08 ENCOUNTER — Encounter: Payer: Self-pay | Admitting: *Deleted

## 2022-10-11 ENCOUNTER — Institutional Professional Consult (permissible substitution): Payer: Medicare HMO | Admitting: Pulmonary Disease

## 2022-10-24 ENCOUNTER — Other Ambulatory Visit: Payer: Self-pay | Admitting: Physician Assistant

## 2022-10-24 ENCOUNTER — Other Ambulatory Visit (HOSPITAL_COMMUNITY): Payer: Self-pay | Admitting: Psychiatry

## 2022-10-24 DIAGNOSIS — F419 Anxiety disorder, unspecified: Secondary | ICD-10-CM

## 2022-10-24 DIAGNOSIS — R4589 Other symptoms and signs involving emotional state: Secondary | ICD-10-CM

## 2022-10-24 DIAGNOSIS — M5412 Radiculopathy, cervical region: Secondary | ICD-10-CM

## 2022-10-24 DIAGNOSIS — M5030 Other cervical disc degeneration, unspecified cervical region: Secondary | ICD-10-CM

## 2022-10-25 ENCOUNTER — Institutional Professional Consult (permissible substitution): Payer: Medicare HMO | Admitting: Pulmonary Disease

## 2022-11-06 ENCOUNTER — Ambulatory Visit (INDEPENDENT_AMBULATORY_CARE_PROVIDER_SITE_OTHER): Payer: Medicare HMO | Admitting: Physician Assistant

## 2022-11-06 VITALS — BP 122/72 | HR 74

## 2022-11-06 DIAGNOSIS — T7840XD Allergy, unspecified, subsequent encounter: Secondary | ICD-10-CM

## 2022-11-06 DIAGNOSIS — R519 Headache, unspecified: Secondary | ICD-10-CM | POA: Diagnosis not present

## 2022-11-06 DIAGNOSIS — R599 Enlarged lymph nodes, unspecified: Secondary | ICD-10-CM | POA: Diagnosis not present

## 2022-11-06 DIAGNOSIS — E782 Mixed hyperlipidemia: Secondary | ICD-10-CM | POA: Diagnosis not present

## 2022-11-06 DIAGNOSIS — R5383 Other fatigue: Secondary | ICD-10-CM

## 2022-11-06 DIAGNOSIS — Z1211 Encounter for screening for malignant neoplasm of colon: Secondary | ICD-10-CM

## 2022-11-06 DIAGNOSIS — Z79899 Other long term (current) drug therapy: Secondary | ICD-10-CM

## 2022-11-06 MED ORDER — FAMOTIDINE 20 MG PO TABS
20.0000 mg | ORAL_TABLET | Freq: Every day | ORAL | 0 refills | Status: DC
Start: 2022-11-06 — End: 2023-08-21

## 2022-11-06 MED ORDER — FEXOFENADINE HCL 180 MG PO TABS
180.0000 mg | ORAL_TABLET | Freq: Every day | ORAL | 0 refills | Status: DC
Start: 2022-11-06 — End: 2023-08-13

## 2022-11-06 NOTE — Progress Notes (Signed)
Established Patient Office Visit  Subjective   Patient ID: Kellie [name], female    DOB: March 21, 1956  Age: 67 y.o. MRN: [id number]  Chief Complaint  Patient presents with   Follow-up    HPI Pt is a 67 yo female who presents to the clinic with her sister for follow up.   Pt's mood is being managed by [facility], downstairs.   She is having intermittent headaches on the top right side of head at least 3. She takes tylenol but does not seem to help. They just go away on their own. She is not on allergy medicaton. No vision changes. Describes the headache as dull.   She is concerned about her lymph nodes in her neck. She has PND and drainage and they seem swollen.   .. Active Ambulatory Problems    Diagnosis Date Noted   GERD (gastroesophageal reflux disease) 09/27/2010   Allergic rhinitis 11/08/2010   Insomnia 11/08/2010   Chronic cough 06/02/2011   Osteoporosis 11/21/2011   Borderline intellectual functioning 02/15/2012   Genital herpes 11/05/2012   Hemorrhoids 11/05/2012   Chronic periodontal disease 06/30/2013   Adhesive capsulitis of right shoulder 11/24/2013   Encounter for gynecological examination with Papanicolaou smear of cervix 12/08/2013   Prediabetes 12/14/2013   Postmenopausal atrophic vaginitis 03/17/2014   Bipolar disorder with depression (HCC) 06/02/2014   Family history of renal cancer 10/11/2014   DDD (degenerative disc disease), lumbar 10/11/2014   Rhinitis, allergic 10/14/2014   Near syncope 02/09/2015   Chronic nasal congestion 03/01/2015   Vitreous floaters of right eye 03/01/2015   DDD (degenerative disc disease), cervical 07/05/2015   Cough, persistent 07/21/2015   Post-nasal drip 07/21/2015   Left knee pain 11/09/2015   Chronic pain of both knees 11/09/2015   Plantar fasciitis, bilateral 11/09/2015   Hoarseness 03/09/2016   Pulmonary nodule 08/08/2016   Bipolar 1 disorder with moderate mania (HCC) 09/17/2016   Panic attacks 09/18/2016   SOB  (shortness of breath) 09/18/2016   Weakness 10/05/2016   Family history of pancreatic cancer 02/18/2017   Diarrhea 02/18/2017   Loss of weight 02/18/2017   Unintentional weight loss 05/18/2017   Elevated LDL cholesterol level 05/18/2017   Hyperlipidemia 05/18/2017   Irritable bowel syndrome with both constipation and diarrhea 05/18/2017   History of loop electrical excision procedure (LEEP) 07/01/2017   Atypical chest pain 10/22/2017   Anxiety 10/22/2017   Aortic insufficiency 01/10/2018   Non-restorative sleep 01/10/2018   Snoring 01/10/2018   Dermatochalasis of both upper eyelids 03/13/2018   Periodic limb movement 03/13/2018   Nocturnal leg cramps 03/13/2018   Vaginal dryness 09/02/2018   History of diverticulitis 09/16/2018   Chronic pain of both ankles 01/15/2019   Cervical stenosis (uterine cervix) 02/13/2019   Vitamin D insufficiency 02/16/2019   Plantar fasciitis, right 04/02/2019   Reactive airway disease without asthma 04/17/2019   Cortical age-related cataract of both eyes 12/22/2018   Pinguecula of both eyes 12/22/2018   Nuclear sclerotic cataract of both eyes 12/22/2018   Anterolisthesis 07/28/2019   Vitamin D deficiency 12/02/2019   No energy 12/02/2019   Rectal bleeding 12/02/2019   Dysphagia 12/02/2019   Tenderness of neck 12/02/2019   Hyperinflation of lungs 01/15/2020   History of iron deficiency 02/15/2020   Globin abnormality (HCC) 02/19/2020   Hiatal hernia 02/19/2020   Gastroesophageal reflux disease with esophagitis 02/19/2020   Belching 03/23/2020   Leukopenia 06/21/2020   Low serum calcium 06/21/2020   Breast pain, right 07/11/2020  Nonrheumatic aortic valve insufficiency 09/20/2020   Bilateral leg pain 09/20/2020   Numbness and tingling of both feet 09/20/2020   Family history of stroke 09/20/2020   Ankle edema, bilateral 09/20/2020   Anxiety about health 09/20/2020   Cervical radiculopathy 05/03/2021   Left thyroid nodule 06/20/2021    Elevated lipase 08/25/2021   Elevated TSH 08/25/2021   Nausea and vomiting 09/08/2021   Upper abdominal pain 09/08/2021   Resolved Ambulatory Problems    Diagnosis Date Noted   Sinusitis 09/27/2010   Depression 09/27/2010   Bronchitis 12/04/2010   General medical examination 10/18/2011   Dizziness 11/21/2011   Knee pain 11/21/2011   Low back pain 11/21/2011   Heel pain 07/14/2012   Thoracic myofascial strain 03/13/2013   Sinusitis 04/20/2013   Cough 04/22/2013   Rotator cuff disorder 08/01/2013   Right shoulder pain 10/27/2013   Adhesive capsulitis 11/02/2013   Pain in joint, shoulder region 11/24/2013   Bipolar disorder (HCC) 04/29/2014   Right serous otitis media 08/03/2014   Acute recurrent maxillary sinusitis 10/14/2014   Right-sided low back pain without sciatica 10/14/2014   Sinusitis 02/09/2015   Sacroiliac joint dysfunction of right side 02/25/2015   Piriformis syndrome 03/01/2015   Adhesive capsulitis 07/05/2015   Radiculitis of right cervical region 07/06/2015   Drug reaction 09/25/2016   Mitral valve prolapse 01/10/2018   Past Medical History:  Diagnosis Date   Abnormal Pap smear of cervix    Allergic rhinitis 11/08/2010   Asthma    Herpes simplex of female genitalia    Thyroid nodule    Urticaria      ROS See HPI.    Objective:     BP 122/72   Pulse 74  BP Readings from Last 3 Encounters:  11/09/22 122/72  03/19/22 109/62  08/30/21 (!) 115/57   Wt Readings from Last 3 Encounters:  03/19/22 107 lb (48.5 kg)  08/30/21 113 lb (51.3 kg)  07/10/21 113 lb (51.3 kg)      Physical Exam Constitutional:      Appearance: Normal appearance.  HENT:     Head: Normocephalic.     Right Ear: Tympanic membrane, ear canal and external ear normal. There is no impacted cerumen.     Left Ear: Tympanic membrane, ear canal and external ear normal. There is no impacted cerumen.     Nose: Nose normal.     Mouth/Throat:     Mouth: Mucous membranes are moist.      Pharynx: No oropharyngeal exudate or posterior oropharyngeal erythema.  Eyes:     Conjunctiva/sclera: Conjunctivae normal.  Neck:     Vascular: No carotid bruit.     Comments: Very small shotty anterior cervical adenopathy- bilateral and non tender as well as mobile.  Cardiovascular:     Rate and Rhythm: Normal rate and regular rhythm.  Pulmonary:     Effort: Pulmonary effort is normal.     Breath sounds: Normal breath sounds.  Musculoskeletal:     Cervical back: Normal range of motion and neck supple. No rigidity or tenderness.     Right lower leg: No edema.     Left lower leg: No edema.  Lymphadenopathy:     Cervical: Cervical adenopathy present.  Neurological:     General: No focal deficit present.     Mental Status: She is oriented to person, place, and time.  Psychiatric:        Mood and Affect: Mood normal.      The ASCVD Risk  score (Arnett DK, et al., 2019) failed to calculate for the following reasons:   The valid HDL cholesterol range is 20 to 100 mg/dL    Assessment & Plan:  [name] KitchenMarland KitchenManinder was seen today for follow-up.  Diagnoses and all orders for this visit:  Allergy, subsequent encounter -     fexofenadine (ALLEGRA) 180 MG tablet; Take 1 tablet (180 mg total) by mouth daily. -     famotidine (PEPCID) 20 MG tablet; Take 1 tablet (20 mg total) by mouth at bedtime.  Mixed hyperlipidemia -     Lipid panel -     Lipoprotein A (LPA)  Medication management -     Vitamin B1 -     B12 and Folate Panel -     Fe+TIBC+Fer -     Lipid panel -     Lipoprotein A (LPA) -     TSH + free T4 -     COMPLETE METABOLIC PANEL WITH GFR -     VITAMIN D 25 Hydroxy (Vit-D Deficiency, Fractures) -     US Soft Tissue Head/Neck (NON-THYROID); Future  Reactive lymphadenopathy -     fexofenadine (ALLEGRA) 180 MG tablet; Take 1 tablet (180 mg total) by mouth daily. -     famotidine (PEPCID) 20 MG tablet; Take 1 tablet (20 mg total) by mouth at bedtime. -     US Soft Tissue  Head/Neck (NON-THYROID); Future  Acute intractable headache, unspecified headache type -     fexofenadine (ALLEGRA) 180 MG tablet; Take 1 tablet (180 mg total) by mouth daily.  No energy  Colon cancer screening -     Cologuard   I think her symptoms are allergy related Start back on allegra and pepcid daily Follow up in 4-6 weeks  Fasting labs ordered at patients request Cologuard kit to be mailed [place] of neck ordered    Tandy Gaw, PA-C

## 2022-11-08 ENCOUNTER — Encounter: Payer: Self-pay | Admitting: Gastroenterology

## 2022-11-08 DIAGNOSIS — E559 Vitamin D deficiency, unspecified: Secondary | ICD-10-CM | POA: Diagnosis not present

## 2022-11-08 DIAGNOSIS — E782 Mixed hyperlipidemia: Secondary | ICD-10-CM | POA: Diagnosis not present

## 2022-11-08 DIAGNOSIS — Z79899 Other long term (current) drug therapy: Secondary | ICD-10-CM | POA: Diagnosis not present

## 2022-11-08 DIAGNOSIS — D649 Anemia, unspecified: Secondary | ICD-10-CM | POA: Diagnosis not present

## 2022-11-09 ENCOUNTER — Encounter: Payer: Self-pay | Admitting: Physician Assistant

## 2022-11-09 ENCOUNTER — Encounter: Payer: Self-pay | Admitting: Pulmonary Disease

## 2022-11-09 LAB — COMPLETE METABOLIC PANEL WITH GFR
ALT: 14 U/L (ref 6–29)
Alkaline phosphatase (APISO): 48 U/L (ref 37–153)
Calcium: 9.8 mg/dL (ref 8.6–10.4)
Globulin: 2.1 g/dL (calc) (ref 1.9–3.7)
Total Bilirubin: 0.4 mg/dL (ref 0.2–1.2)

## 2022-11-09 LAB — IRON,TIBC AND FERRITIN PANEL
Iron: 111 ug/dL (ref 45–160)
TIBC: 324 mcg/dL (calc) (ref 250–450)

## 2022-11-09 LAB — LIPID PANEL
LDL Cholesterol (Calc): 147 mg/dL (calc) — ABNORMAL HIGH
Non-HDL Cholesterol (Calc): 159 mg/dL (calc) — ABNORMAL HIGH (ref <130)

## 2022-11-09 NOTE — Progress Notes (Signed)
Allea,   Thyroid looks good and stable from 3 months ago.  Vitamin D looks good.  Kidney, liver, glucose looks good.  Iron looks good.  B12 looks great.  LDL is high but HDL looks amazing!

## 2022-11-11 LAB — COMPLETE METABOLIC PANEL WITH GFR
AST: 21 U/L (ref 10–35)
Creat: 0.75 mg/dL (ref 0.50–1.05)
Potassium: 4.7 mmol/L (ref 3.5–5.3)
Total Protein: 6.9 g/dL (ref 6.1–8.1)

## 2022-11-11 LAB — B12 AND FOLATE PANEL
Folate: >24 ng/mL
Vitamin B-12: 538 pg/mL (ref 200–1100)

## 2022-11-11 LAB — IRON,TIBC AND FERRITIN PANEL: Ferritin: 47 ng/mL (ref 16–288)

## 2022-11-11 LAB — LIPID PANEL: HDL: 102 mg/dL (ref >=50)

## 2022-11-14 ENCOUNTER — Telehealth (INDEPENDENT_AMBULATORY_CARE_PROVIDER_SITE_OTHER): Payer: Medicare HMO | Admitting: Physician Assistant

## 2022-11-14 ENCOUNTER — Ambulatory Visit (INDEPENDENT_AMBULATORY_CARE_PROVIDER_SITE_OTHER): Payer: Medicare HMO

## 2022-11-14 DIAGNOSIS — E7841 Elevated Lipoprotein(a): Secondary | ICD-10-CM | POA: Diagnosis not present

## 2022-11-14 DIAGNOSIS — R931 Abnormal findings on diagnostic imaging of heart and coronary circulation: Secondary | ICD-10-CM | POA: Diagnosis not present

## 2022-11-14 DIAGNOSIS — R4589 Other symptoms and signs involving emotional state: Secondary | ICD-10-CM | POA: Diagnosis not present

## 2022-11-14 DIAGNOSIS — N882 Stricture and stenosis of cervix uteri: Secondary | ICD-10-CM | POA: Diagnosis not present

## 2022-11-14 DIAGNOSIS — Z79899 Other long term (current) drug therapy: Secondary | ICD-10-CM

## 2022-11-14 DIAGNOSIS — D259 Leiomyoma of uterus, unspecified: Secondary | ICD-10-CM | POA: Diagnosis not present

## 2022-11-14 DIAGNOSIS — E782 Mixed hyperlipidemia: Secondary | ICD-10-CM | POA: Diagnosis not present

## 2022-11-14 DIAGNOSIS — E041 Nontoxic single thyroid nodule: Secondary | ICD-10-CM | POA: Diagnosis not present

## 2022-11-14 DIAGNOSIS — R599 Enlarged lymph nodes, unspecified: Secondary | ICD-10-CM | POA: Diagnosis not present

## 2022-11-14 LAB — COMPLETE METABOLIC PANEL WITH GFR
AG Ratio: 2.3 (calc) (ref 1.0–2.5)
Albumin: 4.8 g/dL (ref 3.6–5.1)
BUN: 16 mg/dL (ref 7–25)
CO2: 28 mmol/L (ref 20–32)
Chloride: 100 mmol/L (ref 98–110)
Glucose, Bld: 84 mg/dL (ref 65–99)
Sodium: 138 mmol/L (ref 135–146)
eGFR: 87 mL/min/{1.73_m2} (ref >=60)

## 2022-11-14 LAB — TSH+FREE T4: TSH W/REFLEX TO FT4: 2.07 mIU/L (ref 0.40–4.50)

## 2022-11-14 LAB — LIPID PANEL
Cholesterol: 261 mg/dL — ABNORMAL HIGH (ref <200)
Total CHOL/HDL Ratio: 2.6 (calc) (ref <5.0)
Triglycerides: 40 mg/dL (ref <150)

## 2022-11-14 LAB — VITAMIN B1: Vitamin B1 (Thiamine): 25 nmol/L (ref 8–30)

## 2022-11-14 LAB — IRON,TIBC AND FERRITIN PANEL: %SAT: 34 % (calc) (ref 16–45)

## 2022-11-14 LAB — LIPOPROTEIN A (LPA): Lipoprotein (a): 155 nmol/L — ABNORMAL HIGH (ref <75)

## 2022-11-14 LAB — VITAMIN D 25 HYDROXY (VIT D DEFICIENCY, FRACTURES): Vit D, 25-Hydroxy: 48 ng/mL (ref 30–100)

## 2022-11-14 NOTE — Progress Notes (Signed)
No concerns with masses or lymph nodes in the neck.

## 2022-11-14 NOTE — Progress Notes (Signed)
..Virtual Visit via Video Note  I connected with Kellie [name] on 11/14/22 at  3:40 PM EDT by a video enabled telemedicine application and verified that I am speaking with the correct person using two identifiers.  Location: Patient: home Provider: clinic  .[name] KitchenParticipating in visit:  Patient: Kellie [name] Provider: Tandy Gaw PA-C   I discussed the limitations of evaluation and management by telemedicine and the availability of in person appointments. The patient expressed understanding and agreed to proceed.  History of Present Illness: Pt saw elevated cholesterol and lipoprotein a level. She is concerned and wanted to discussed. She eats really good and stays active. She does not want to take statins. She is concerned about side effects.   She request her u/s of vaginal to follow up on fibroids and stenosis of cervix.   .. Active Ambulatory Problems    Diagnosis Date Noted   GERD (gastroesophageal reflux disease) 09/27/2010   Allergic rhinitis 11/08/2010   Insomnia 11/08/2010   Chronic cough 06/02/2011   Osteoporosis 11/21/2011   Borderline intellectual functioning 02/15/2012   Genital herpes 11/05/2012   Hemorrhoids 11/05/2012   Chronic periodontal disease 06/30/2013   Adhesive capsulitis of right shoulder 11/24/2013   Encounter for gynecological examination with Papanicolaou smear of cervix 12/08/2013   Prediabetes 12/14/2013   Postmenopausal atrophic vaginitis 03/17/2014   Bipolar disorder with depression (HCC) 06/02/2014   Family history of renal cancer 10/11/2014   DDD (degenerative disc disease), lumbar 10/11/2014   Rhinitis, allergic 10/14/2014   Near syncope 02/09/2015   Chronic nasal congestion 03/01/2015   Vitreous floaters of right eye 03/01/2015   DDD (degenerative disc disease), cervical 07/05/2015   Cough, persistent 07/21/2015   Post-nasal drip 07/21/2015   Left knee pain 11/09/2015   Chronic pain of both knees 11/09/2015   Plantar fasciitis, bilateral  11/09/2015   Hoarseness 03/09/2016   Pulmonary nodule 08/08/2016   Bipolar 1 disorder with moderate mania (HCC) 09/17/2016   Panic attacks 09/18/2016   SOB (shortness of breath) 09/18/2016   Weakness 10/05/2016   Family history of pancreatic cancer 02/18/2017   Diarrhea 02/18/2017   Loss of weight 02/18/2017   Unintentional weight loss 05/18/2017   Elevated LDL cholesterol level 05/18/2017   Hyperlipidemia 05/18/2017   Irritable bowel syndrome with both constipation and diarrhea 05/18/2017   History of loop electrical excision procedure (LEEP) 07/01/2017   Atypical chest pain 10/22/2017   Anxiety 10/22/2017   Aortic insufficiency 01/10/2018   Non-restorative sleep 01/10/2018   Snoring 01/10/2018   Dermatochalasis of both upper eyelids 03/13/2018   Periodic limb movement 03/13/2018   Nocturnal leg cramps 03/13/2018   Vaginal dryness 09/02/2018   History of diverticulitis 09/16/2018   Chronic pain of both ankles 01/15/2019   Cervical stenosis (uterine cervix) 02/13/2019   Vitamin D insufficiency 02/16/2019   Plantar fasciitis, right 04/02/2019   Reactive airway disease without asthma 04/17/2019   Cortical age-related cataract of both eyes 12/22/2018   Pinguecula of both eyes 12/22/2018   Nuclear sclerotic cataract of both eyes 12/22/2018   Anterolisthesis 07/28/2019   Vitamin D deficiency 12/02/2019   No energy 12/02/2019   Rectal bleeding 12/02/2019   Dysphagia 12/02/2019   Tenderness of neck 12/02/2019   Hyperinflation of lungs 01/15/2020   History of iron deficiency 02/15/2020   Globin abnormality (HCC) 02/19/2020   Hiatal hernia 02/19/2020   Gastroesophageal reflux disease with esophagitis 02/19/2020   Belching 03/23/2020   Leukopenia 06/21/2020   Low serum calcium 06/21/2020   Breast pain,  right 07/11/2020   Nonrheumatic aortic valve insufficiency 09/20/2020   Bilateral leg pain 09/20/2020   Numbness and tingling of both feet 09/20/2020   Family history of stroke  09/20/2020   Ankle edema, bilateral 09/20/2020   Anxiety about health 09/20/2020   Cervical radiculopathy 05/03/2021   Left thyroid nodule 06/20/2021   Elevated lipase 08/25/2021   Elevated TSH 08/25/2021   Nausea and vomiting 09/08/2021   Upper abdominal pain 09/08/2021   Resolved Ambulatory Problems    Diagnosis Date Noted   Sinusitis 09/27/2010   Depression 09/27/2010   Bronchitis 12/04/2010   General medical examination 10/18/2011   Dizziness 11/21/2011   Knee pain 11/21/2011   Low back pain 11/21/2011   Heel pain 07/14/2012   Thoracic myofascial strain 03/13/2013   Sinusitis 04/20/2013   Cough 04/22/2013   Rotator cuff disorder 08/01/2013   Right shoulder pain 10/27/2013   Adhesive capsulitis 11/02/2013   Pain in joint, shoulder region 11/24/2013   Bipolar disorder (HCC) 04/29/2014   Right serous otitis media 08/03/2014   Acute recurrent maxillary sinusitis 10/14/2014   Right-sided low back pain without sciatica 10/14/2014   Sinusitis 02/09/2015   Sacroiliac joint dysfunction of right side 02/25/2015   Piriformis syndrome 03/01/2015   Adhesive capsulitis 07/05/2015   Radiculitis of right cervical region 07/06/2015   Drug reaction 09/25/2016   Mitral valve prolapse 01/10/2018   Past Medical History:  Diagnosis Date   Abnormal Pap smear of cervix    Allergic rhinitis 11/08/2010   Asthma    Herpes simplex of female genitalia    Thyroid nodule    Urticaria        Observations/Objective: Anxious and concerned about health   Assessment and Plan: [name] KitchenMarland KitchenDiagnoses and all orders for this visit:  Anxiety about health  Mixed hyperlipidemia -     Ambulatory referral to Cardiology  Elevated lipoprotein A level -     Ambulatory referral to Cardiology  Agatston coronary artery calcium score less than 100  Cervical stenosis (uterine cervix) -     US Pelvic Complete With Transvaginal; Future  Uterine leiomyoma, unspecified location -     US Pelvic Complete With  Transvaginal; Future   Discussed no medications right now that target lipoprotein a right now.  Your HDL is great which is amazing.  I would suggest start a statin but patient is concerned about this.  She did have calcium score and found her LAD did have some calcium accumulation.  Will refer to cardiology to have patient to discuss risk.   [place] of pelvis ordered for follow up on cervical stenosis and fibroids.     Follow Up Instructions:    I discussed the assessment and treatment plan with the patient. The patient was provided an opportunity to ask questions and all were answered. The patient agreed with the plan and demonstrated an understanding of the instructions.   The patient was advised to call back or seek an in-person evaluation if the symptoms worsen or if the condition fails to improve as anticipated.   Tandy Gaw, PA-C

## 2022-11-14 NOTE — Progress Notes (Signed)
Your LDL has increased. This is your bad cholesterol. Your lipoprotein came by and your cardiovascular risk is higher. Thankfully your HDL, good cholesterol, looks good. Are there any cholesterol medications your are willing to take? The most effective is statin.

## 2022-11-20 ENCOUNTER — Encounter: Payer: Self-pay | Admitting: Physician Assistant

## 2022-11-20 DIAGNOSIS — K649 Unspecified hemorrhoids: Secondary | ICD-10-CM

## 2022-11-20 DIAGNOSIS — R931 Abnormal findings on diagnostic imaging of heart and coronary circulation: Secondary | ICD-10-CM | POA: Insufficient documentation

## 2022-11-20 DIAGNOSIS — E7841 Elevated Lipoprotein(a): Secondary | ICD-10-CM | POA: Insufficient documentation

## 2022-11-21 ENCOUNTER — Ambulatory Visit (INDEPENDENT_AMBULATORY_CARE_PROVIDER_SITE_OTHER): Payer: Medicare HMO

## 2022-11-21 ENCOUNTER — Encounter: Payer: Self-pay | Admitting: Physician Assistant

## 2022-11-21 DIAGNOSIS — D259 Leiomyoma of uterus, unspecified: Secondary | ICD-10-CM | POA: Diagnosis not present

## 2022-11-21 DIAGNOSIS — D251 Intramural leiomyoma of uterus: Secondary | ICD-10-CM | POA: Diagnosis not present

## 2022-11-21 DIAGNOSIS — N882 Stricture and stenosis of cervix uteri: Secondary | ICD-10-CM

## 2022-11-21 MED ORDER — HYDROCORTISONE (PERIANAL) 2.5 % EX CREA
1.0000 | TOPICAL_CREAM | Freq: Two times a day (BID) | CUTANEOUS | 1 refills | Status: DC | PRN
Start: 2022-11-21 — End: 2023-08-13

## 2022-11-26 ENCOUNTER — Other Ambulatory Visit: Payer: Self-pay | Admitting: Physician Assistant

## 2022-11-26 ENCOUNTER — Encounter: Payer: Self-pay | Admitting: Physician Assistant

## 2022-11-26 ENCOUNTER — Other Ambulatory Visit (HOSPITAL_COMMUNITY): Payer: Self-pay | Admitting: Psychiatry

## 2022-11-26 DIAGNOSIS — M5412 Radiculopathy, cervical region: Secondary | ICD-10-CM

## 2022-11-26 DIAGNOSIS — R4589 Other symptoms and signs involving emotional state: Secondary | ICD-10-CM

## 2022-11-26 DIAGNOSIS — M5030 Other cervical disc degeneration, unspecified cervical region: Secondary | ICD-10-CM

## 2022-11-26 DIAGNOSIS — N9489 Other specified conditions associated with female genital organs and menstrual cycle: Secondary | ICD-10-CM | POA: Insufficient documentation

## 2022-11-26 DIAGNOSIS — F419 Anxiety disorder, unspecified: Secondary | ICD-10-CM

## 2022-11-26 NOTE — Progress Notes (Signed)
You do have fibroid and possible 3mm endometrial mass. Needs more follow up with GYN. Are you ok going next door?  Benign right ovarian cyst.

## 2022-11-27 ENCOUNTER — Encounter: Payer: Self-pay | Admitting: Physician Assistant

## 2022-11-27 DIAGNOSIS — D219 Benign neoplasm of connective and other soft tissue, unspecified: Secondary | ICD-10-CM

## 2022-11-28 ENCOUNTER — Encounter: Payer: Self-pay | Admitting: Physician Assistant

## 2022-11-28 DIAGNOSIS — Z1211 Encounter for screening for malignant neoplasm of colon: Secondary | ICD-10-CM | POA: Diagnosis not present

## 2022-12-05 NOTE — Telephone Encounter (Signed)
Patient has appointment scheduled 12/20/2022 at 11:10 am with Milas Hock at [facility]. Referral was placed on 11/27/2022 by [facility] and normally takes  up to 2 weeks for referring office to contact patient.

## 2022-12-12 ENCOUNTER — Ambulatory Visit (INDEPENDENT_AMBULATORY_CARE_PROVIDER_SITE_OTHER): Payer: Medicare HMO | Admitting: Obstetrics and Gynecology

## 2022-12-12 ENCOUNTER — Encounter: Payer: Self-pay | Admitting: Obstetrics and Gynecology

## 2022-12-12 ENCOUNTER — Other Ambulatory Visit (HOSPITAL_COMMUNITY)
Admission: RE | Admit: 2022-12-12 | Discharge: 2022-12-12 | Disposition: A | Discharge status: Home / Self Care | Payer: Medicare HMO | Source: Ambulatory Visit | Attending: Obstetrics and Gynecology | Admitting: Obstetrics and Gynecology

## 2022-12-12 VITALS — BP 116/71 | HR 101 | Ht 66.0 in | Wt 105.0 lb

## 2022-12-12 DIAGNOSIS — R9389 Abnormal findings on diagnostic imaging of other specified body structures: Secondary | ICD-10-CM | POA: Insufficient documentation

## 2022-12-12 DIAGNOSIS — Z124 Encounter for screening for malignant neoplasm of cervix: Secondary | ICD-10-CM

## 2022-12-12 DIAGNOSIS — D219 Benign neoplasm of connective and other soft tissue, unspecified: Secondary | ICD-10-CM

## 2022-12-12 DIAGNOSIS — Z1151 Encounter for screening for human papillomavirus (HPV): Secondary | ICD-10-CM | POA: Insufficient documentation

## 2022-12-12 DIAGNOSIS — Z01411 Encounter for gynecological examination (general) (routine) with abnormal findings: Secondary | ICD-10-CM | POA: Insufficient documentation

## 2022-12-12 LAB — COLOGUARD: COLOGUARD: NEGATIVE

## 2022-12-12 NOTE — Progress Notes (Signed)
NEW GYNECOLOGY VISIT  Subjective:  Kellie [name] is a 67 y.o. postmenopausal G1P1 presenting for follow up of endometrial mass  Pt initially got pelvic US 01/2019 due to history of cervical stenosis. A small 1cm fibroid was seen as well as fluid within the endocervical canal. A repeat US was obtained to monitor her fibroid in 2022 that showed stability of her fibroid and decrease in fluid previously seen. Last month, she requested a follow up ultrasound to monitor her fibroids. [place] 5/29 shows stability of her fibroid, but possible 3mm endometrial mass.   She is asymptomatic. Denies any vaginal bleeding. Has some urinary frequency and reports pelvic pressure when bladder full. Reports 6lb WL over the past 1.5 months, reports that she frequently struggles with her weight.   Of note, pt has hx LEEP in 2011 for CINI, final path benign. Subsequent paps have been NILM. Last pap 07/01/17 NILM/HPV neg.   I personally reviewed the following: - Pelvic US 03/05/2019 - uterus w/ 9 x 11 x 9 mm intramural fundal fibroid. Complex fluid distending the endocervical canal with the largest pocket measuring 13 x 7 x 13mm - Pelvic US 03/01/21 - uterus w/ 11 x 9 x 9 SM fibroid in upper uterine segment unchanged from prior imaging, complicated fluid collection in the endocervical canal slightly decreased from prior study - CT A/P 09/04/21 - sigmoid diverticulosis, no acute findings in abdomen/pelvis - Pelvic US 11/21/22 - 1cm IM fundal fibroid, 1.3cm fluid collection within cervix, fluid within the endometrial cavity and possible 3mm endometrial mass. EL 2.38mm  Past Medical History:  Diagnosis Date   Abnormal Pap smear of cervix    Allergic rhinitis 11/08/2010   Anxiety    Asthma    Bronchitis    GERD (gastroesophageal reflux disease)    Herpes simplex of female genitalia    Hiatal hernia    Hyperlipidemia    Mitral valve prolapse    Osteoporosis 11/21/2011   Thyroid nodule    Urticaria    Past Surgical  History:  Procedure Laterality Date   CLOSED MANIPULATION SHOULDER  7&01/2005   x2, 30 days apart   CLOSED MANIPULATION SHOULDER  2006   Left   COLONOSCOPY  09/2012   Medium sized external hemorrhoids, few small divertic in left colon, otherwise normal colon (repeat 10 yrs)   ESOPHAGOGASTRODUODENOSCOPY  09/2012   Normal (Dr. Christella Hartigan)   LEEP  11/2004   Current Outpatient Medications on File Prior to Visit  Medication Sig Dispense Refill   acyclovir (ZOVIRAX) 200 MG capsule TAKE 2 CAPSULES BY MOUTH TWICE DAILY. 120 capsule 0   albuterol (VENTOLIN HFA) 108 (90 Base) MCG/ACT inhaler Inhale 2 puffs into the lungs every 4 (four) hours as needed for wheezing or shortness of breath. 8.5 g 0   alendronate (FOSAMAX) 70 MG tablet Take by mouth.     ALPRAZolam (XANAX) 0.5 MG tablet TAKE 1 TABLET ONCE DAILY FOR ANXIETY. 30 tablet 2   AMBULATORY NON FORMULARY MEDICATION Incentive spirometer use as directed - fax to [facility] [phone] 1 Units 99   ascorbic acid (VITAMIN C) 100 MG tablet Take 1 tablet by mouth daily.     cetirizine (ZYRTEC) 10 MG tablet Take by mouth.     Cholecalciferol (VITAMIN D) 2000 units CAPS Take by mouth.     citalopram (CELEXA) 10 MG tablet TAKE (1) TABLET BY MOUTH ONCE DAILY. 30 tablet 0   cyclobenzaprine (FLEXERIL) 10 MG tablet One half to one tab PO  qHS 30 tablet 0   famotidine (PEPCID) 20 MG tablet Take 1 tablet (20 mg total) by mouth at bedtime. 90 tablet 0   gabapentin (NEURONTIN) 100 MG capsule TAKE 1 CAPSULE BY MOUTH THREE TIMES A DAY. 90 capsule 0   hydrocortisone (ANUSOL-HC) 2.5 % rectal cream Place 1 Application rectally 2 (two) times daily as needed for hemorrhoids or anal itching. 30 g 1   Menaquinone-7 (VITAMIN K2) 100 MCG CAPS Take by mouth.     pantoprazole (PROTONIX) 40 MG tablet TAKE (1) TABLET BY MOUTH TWICE DAILY. 60 tablet 0   Probiotic Product (PROBIOTIC & ACIDOPHILUS EX ST PO) Take by mouth.      atorvastatin (LIPITOR) 10 MG tablet Take  1 tablet by mouth daily. (Patient not taking: Reported on 12/12/2022)     fexofenadine (ALLEGRA) 180 MG tablet Take 1 tablet (180 mg total) by mouth daily. (Patient not taking: Reported on 12/12/2022) 90 tablet 0   Spacer/Aero-Holding Chambers (AEROCHAMBER MV) inhaler Use as instructed (Patient not taking: Reported on 12/12/2022) 1 each 0   No current facility-administered medications on file prior to visit.   Allergies  Allergen Reactions   Codeine Nausea And Vomiting   Hydrocodone Nausea And Vomiting   Morphine Nausea And Vomiting   Abilify [Aripiprazole]     Nausea/vomiting/weakness/shaky   Morphine And Codeine Nausea And Vomiting   Percocet [Oxycodone-Acetaminophen] Nausea And Vomiting and Other (See Comments)    Patient states it makes blood pressure drop too much   Vicodin [Hydrocodone-Acetaminophen] Nausea And Vomiting   OB History     Gravida  1   Para  1   Term  1   Preterm      AB      Living  1      SAB      IAB      Ectopic      Multiple      Live Births  1          Social History   Socioeconomic History   Marital status: Divorced    Spouse name: Not on file   Number of children: 1   Years of education: 9   Highest education level: 9th grade  Occupational History    Comment: Retired  Tobacco Use   Smoking status: Never   Smokeless tobacco: Never  Vaping Use   Vaping Use: Never used  Substance and Sexual Activity   Alcohol use: No   Drug use: No   Sexual activity: Not Currently    Birth control/protection: Post-menopausal  Other Topics Concern   Not on file  Social History Narrative   Lives with her dog. Her son lives about 28 miles from her; her sister close by. She enjoys shopping and exercising.   Social Determinants of Health   Financial Resource Strain: Low Risk  (08/03/2022)   Overall Financial Resource Strain (CARDIA)    Difficulty of Paying Living Expenses: Not hard at all  Food Insecurity: No Food Insecurity (08/03/2022)    Hunger Vital Sign    Worried About Running Out of Food in the Last Year: Never true    Ran Out of Food in the Last Year: Never true  Transportation Needs: No Transportation Needs (08/03/2022)   PRAPARE - Administrator, Civil Service (Medical): No    Lack of Transportation (Non-Medical): No  Physical Activity: Insufficiently Active (08/03/2022)   Exercise Vital Sign    Days of Exercise per Week: 7 days  Minutes of Exercise per Session: 10 min  Stress: No Stress Concern Present (08/03/2022)   [employer] of Occupational Health - Occupational Stress Questionnaire    Feeling of Stress : Not at all  Social Connections: Moderately Isolated (08/03/2022)   Social Connection and Isolation Panel [NHANES]    Frequency of Communication with Friends and Family: More than three times a week    Frequency of Social Gatherings with Friends and Family: Twice a week    Attends Religious Services: More than 4 times per year    Active Member of [employer] or Organizations: No    Attends Banker Meetings: Never    Marital Status: Divorced  Catering manager Violence: Not At Risk (08/03/2022)   Humiliation, Afraid, Rape, and Kick questionnaire    Fear of Current or Ex-Partner: No    Emotionally Abused: No    Physically Abused: No    Sexually Abused: No   Objective:   Vitals:   12/12/22 1334  BP: 116/71  Pulse: (!) 101  Weight: 105 lb (47.6 kg)  Height: 5\' 6"  (1.676 m)    General:  Alert, oriented and cooperative. Patient is in no acute distress.  Skin: Skin is warm and dry. No rash noted.   Cardiovascular: Normal heart rate noted  Respiratory: Normal respiratory effort, no problems with respiration noted  Abdomen: Soft, non-tender, non-distended   Pelvic: NEFG. Cervix mostly flush with vagina, pap collected without difficulty. Would be difficult to do office EMB, would consider misoprostol pre procedure.  Exam performed in the presence of a chaperone  Assessment and Plan:   RAHNI [name] is a 67 y.o. with possible endometrial mass and fibroids  1. Abnormal finding on ultrasound 2. Fibroid - Detailed conversation with the patient today reviewing her three prior ultrasounds and pap history.  - We discussed the definition of cervical stenosis and that her cervical stenosis is likely due to her prior LEEP as well as changes from menopause. Reviewed that cervical stenosis can cause a build up of fluid like what we've seen on previous ultrasounds. The endocervical fluid has remained stable on imaging and may be a Nabothian cyst. We also discussed that cervical stenosis may present [place] with a challenge should we need to biopsy the endometrium - Discussed that her fibroid has now been stable on 3 ultrasounds over four years. We would expect her fibroid to remain stable or shrink in menopause. Since it is not causing symptoms and is stable, we would not continue to monitor.  - We reviewed that her most recent ultrasound findings (endometrial fluid, possible endometrial mass) need to be monitored. On my review of images, endometrial thickness, even including fluid, is < 4mm and the EL of both "slices" of endometrium is 2.37mm. This is reassuring. The area of endometrium in question for a mass is homogenous appearing.  - She is asymptomatic, but in the setting of known cervical stenosis she may not have bleeding so needs to be under close surveillance. Recommended surveillance with repeat ultrasound in 3-6 months. Low threshold for sampling if persistent findings at that time. - Strongly recommend patient calls [place] prior to her ultrasound if she develops PMB or pain so we can proceed with biopsy sooner.  - Discussed her pap history. Offered pap today. We discussed that if it is normal, it can likely be her last pap since her LEEP was in 2011 and benign. We will continue to address as needed.  -     US PELVIC  COMPLETE WITH TRANSVAGINAL; Future -     Cytology - PAP( [facility])  Return in about 3 months (around 03/14/2023) for pelvic ultrasound & virtual appointment to review next steps.  Future Appointments  Date Time Provider Department Center  02/04/2023 10:40 AM Lewayne Bunting, MD [facility] None  04/01/2023  9:00 AM Thresa Ross, MD [facility] None  08/06/2023  9:00 AM [facility] V[facility] None   I spent a total of 37 minutes of face-to-face time with the patient as well as 23 reviewing her chart and documenting our encounter. Total encounter time: 50 minutes.  Lennart Pall, MD

## 2022-12-12 NOTE — Patient Instructions (Signed)
It was nice meeting you today! You will see your pap results in the next 1-2 weeks in the MyChart app. I will let you know if you need to do anything extra.   If you have any new vaginal bleeding, please let me know.

## 2022-12-12 NOTE — Progress Notes (Signed)
Negative cologuard. Follow up in 3 years.

## 2022-12-13 LAB — CYTOLOGY - PAP
Comment: NEGATIVE
Diagnosis: NEGATIVE
High risk HPV: NEGATIVE

## 2022-12-20 ENCOUNTER — Encounter: Payer: Medicare HMO | Admitting: Obstetrics and Gynecology

## 2022-12-25 ENCOUNTER — Other Ambulatory Visit: Payer: Self-pay | Admitting: Physician Assistant

## 2022-12-25 ENCOUNTER — Other Ambulatory Visit (HOSPITAL_COMMUNITY): Payer: Self-pay | Admitting: Psychiatry

## 2022-12-25 DIAGNOSIS — M5412 Radiculopathy, cervical region: Secondary | ICD-10-CM

## 2022-12-25 DIAGNOSIS — R4589 Other symptoms and signs involving emotional state: Secondary | ICD-10-CM

## 2022-12-25 DIAGNOSIS — F419 Anxiety disorder, unspecified: Secondary | ICD-10-CM

## 2022-12-25 DIAGNOSIS — M5030 Other cervical disc degeneration, unspecified cervical region: Secondary | ICD-10-CM

## 2022-12-27 ENCOUNTER — Encounter: Payer: Self-pay | Admitting: Physician Assistant

## 2022-12-27 DIAGNOSIS — R4589 Other symptoms and signs involving emotional state: Secondary | ICD-10-CM

## 2022-12-27 DIAGNOSIS — F419 Anxiety disorder, unspecified: Secondary | ICD-10-CM

## 2022-12-28 ENCOUNTER — Other Ambulatory Visit: Payer: Self-pay | Admitting: Physician Assistant

## 2022-12-28 ENCOUNTER — Encounter: Payer: Self-pay | Admitting: Physician Assistant

## 2022-12-28 DIAGNOSIS — R4589 Other symptoms and signs involving emotional state: Secondary | ICD-10-CM

## 2022-12-28 DIAGNOSIS — F419 Anxiety disorder, unspecified: Secondary | ICD-10-CM

## 2022-12-28 MED ORDER — ALPRAZOLAM 0.5 MG PO TABS
0.50 mg | ORAL_TABLET | Freq: Every day | ORAL | 2 refills | Status: AC | PRN
Start: 2022-12-28 — End: ?

## 2022-12-28 NOTE — Telephone Encounter (Signed)
See other MyChart message

## 2022-12-28 NOTE — Telephone Encounter (Signed)
I did call and speak to the pharmacist directly.  For some reason there computer system says 0.25 was what was sent and dispensed on June 4.  But what we see in our system is 0.5.  There was no prescription for 0.25.  And according to the PDMP she was dispensed 0.5.  So the data does not match which is unusual and did discuss this with the pharmacist he will keep an eye out for any future errors.  And in the meantime we did send an updated prescription for the 0.5.

## 2022-12-28 NOTE — Telephone Encounter (Signed)
Based on the medication list this 0.5 was sent into the pharmacy on June 4 and it has 2 refills.  So I would recommend that she call her pharmacy for further refills.

## 2023-01-04 ENCOUNTER — Encounter: Payer: Self-pay | Admitting: Physician Assistant

## 2023-01-04 DIAGNOSIS — E782 Mixed hyperlipidemia: Secondary | ICD-10-CM

## 2023-01-04 DIAGNOSIS — R931 Abnormal findings on diagnostic imaging of heart and coronary circulation: Secondary | ICD-10-CM

## 2023-01-04 DIAGNOSIS — E7841 Elevated Lipoprotein(a): Secondary | ICD-10-CM

## 2023-01-08 NOTE — Addendum Note (Signed)
Addended by: Jomarie Longs on: 01/08/2023 04:53 PM   Modules accepted: Orders

## 2023-01-10 ENCOUNTER — Ambulatory Visit: Payer: Medicare HMO

## 2023-01-10 DIAGNOSIS — E782 Mixed hyperlipidemia: Secondary | ICD-10-CM

## 2023-01-10 DIAGNOSIS — R931 Abnormal findings on diagnostic imaging of heart and coronary circulation: Secondary | ICD-10-CM | POA: Diagnosis not present

## 2023-01-10 DIAGNOSIS — E7800 Pure hypercholesterolemia, unspecified: Secondary | ICD-10-CM | POA: Diagnosis not present

## 2023-01-10 DIAGNOSIS — E7841 Elevated Lipoprotein(a): Secondary | ICD-10-CM | POA: Diagnosis not present

## 2023-01-12 ENCOUNTER — Encounter: Payer: Self-pay | Admitting: Physician Assistant

## 2023-01-12 DIAGNOSIS — R059 Cough, unspecified: Secondary | ICD-10-CM

## 2023-01-14 ENCOUNTER — Other Ambulatory Visit: Payer: Medicare HMO

## 2023-01-14 ENCOUNTER — Encounter: Payer: Self-pay | Admitting: Physician Assistant

## 2023-01-14 MED ORDER — ALBUTEROL SULFATE HFA 108 (90 BASE) MCG/ACT IN AERS
2.0000 | INHALATION_SPRAY | RESPIRATORY_TRACT | 0 refills | Status: DC | PRN
Start: 2023-01-14 — End: 2023-09-09

## 2023-01-14 NOTE — Telephone Encounter (Signed)
I have not seen this patient since 2020

## 2023-01-14 NOTE — Progress Notes (Signed)
No significant plaque accumulation in carotid arteries.

## 2023-01-22 ENCOUNTER — Encounter: Payer: Self-pay | Admitting: Physician Assistant

## 2023-01-24 ENCOUNTER — Other Ambulatory Visit (HOSPITAL_COMMUNITY): Payer: Self-pay | Admitting: Psychiatry

## 2023-01-24 ENCOUNTER — Other Ambulatory Visit: Payer: Self-pay | Admitting: Physician Assistant

## 2023-01-24 DIAGNOSIS — M5030 Other cervical disc degeneration, unspecified cervical region: Secondary | ICD-10-CM

## 2023-01-24 DIAGNOSIS — M5412 Radiculopathy, cervical region: Secondary | ICD-10-CM

## 2023-01-29 NOTE — Progress Notes (Signed)
Referring-Jade Breeback PA-C Reason for referral-hyperlipidemia and chest pain  HPI: 67 year old female for evaluation of hyperlipidemia and chest pain request of Jade Breeback PA-C.  Patient seen previously but not since October 2021. Exercise treadmill July 2019 negative adequate. Echocardiogram March 2022 showed normal LV function, mild to moderate aortic insufficiency.  ABIs May 2022 normal.  Calcium score January 2024 23 which was 62nd percentile.  Carotid Dopplers July 2024 showed no significant stenosis.  Laboratories May 2024 showed lipoprotein a 155, total cholesterol 261, HDL 102, LDL 147.  Cardiology now asked to evaluate.  Patient states she has an occasional pain in her chest that awakes her from sleep.  It is diffuse without radiation and no associated symptoms.  Last seconds and resolves.  She does not have exertional chest pain.  She denies dyspnea, palpitations or syncope.  Some lightheadedness with standing.  Current Outpatient Medications  Medication Sig Dispense Refill   acyclovir (ZOVIRAX) 200 MG capsule TAKE 2 CAPSULES BY MOUTH TWICE DAILY. 120 capsule 0   albuterol (VENTOLIN HFA) 108 (90 Base) MCG/ACT inhaler Inhale 2 puffs into the lungs every 4 (four) hours as needed for wheezing or shortness of breath. 8.5 g 0   alendronate (FOSAMAX) 70 MG tablet Take by mouth.     ALPRAZolam (XANAX) 0.5 MG tablet Take 1 tablet (0.5 mg total) by mouth daily as needed for anxiety. 30 tablet 2   AMBULATORY NON FORMULARY MEDICATION Incentive spirometer use as directed - fax to [facility] [phone] 1 Units 99   ascorbic acid (VITAMIN C) 100 MG tablet Take 1 tablet by mouth daily.     cetirizine (ZYRTEC) 10 MG tablet Take by mouth.     Cholecalciferol (VITAMIN D) 2000 units CAPS Take by mouth.     citalopram (CELEXA) 10 MG tablet TAKE (1) TABLET BY MOUTH ONCE DAILY. 30 tablet 0   cyclobenzaprine (FLEXERIL) 10 MG tablet One half to one tab PO qHS 30 tablet 0    famotidine (PEPCID) 20 MG tablet Take 1 tablet (20 mg total) by mouth at bedtime. 90 tablet 0   fexofenadine (ALLEGRA) 180 MG tablet Take 1 tablet (180 mg total) by mouth daily. 90 tablet 0   gabapentin (NEURONTIN) 100 MG capsule TAKE 1 CAPSULE BY MOUTH THREE TIMES A DAY. 90 capsule 0   hydrocortisone (ANUSOL-HC) 2.5 % rectal cream Place 1 Application rectally 2 (two) times daily as needed for hemorrhoids or anal itching. 30 g 1   Menaquinone-7 (VITAMIN K2) 100 MCG CAPS Take by mouth.     pantoprazole (PROTONIX) 40 MG tablet TAKE (1) TABLET BY MOUTH TWICE DAILY. 60 tablet 0   Probiotic Product (PROBIOTIC & ACIDOPHILUS EX ST PO) Take by mouth.      atorvastatin (LIPITOR) 10 MG tablet Take 1 tablet by mouth daily. (Patient not taking: Reported on 12/12/2022)     No current facility-administered medications for this visit.    Allergies  Allergen Reactions   Codeine Nausea And Vomiting   Hydrocodone Nausea And Vomiting   Morphine Nausea And Vomiting   Abilify [Aripiprazole]     Nausea/vomiting/weakness/shaky   Morphine And Codeine Nausea And Vomiting   Percocet [Oxycodone-Acetaminophen] Nausea And Vomiting and Other (See Comments)    Patient states it makes blood pressure drop too much   Vicodin [Hydrocodone-Acetaminophen] Nausea And Vomiting     Past Medical History:  Diagnosis Date   Abnormal Pap smear of cervix    Allergic rhinitis 11/08/2010   Anxiety  Asthma    Bronchitis    GERD (gastroesophageal reflux disease)    Herpes simplex of female genitalia    Hiatal hernia    Hyperlipidemia    Mitral valve prolapse    Osteoporosis 11/21/2011   Thyroid nodule    Urticaria     Past Surgical History:  Procedure Laterality Date   CLOSED MANIPULATION SHOULDER  7&01/2005   x2, 30 days apart   CLOSED MANIPULATION SHOULDER  2006   Left   COLONOSCOPY  09/2012   Medium sized external hemorrhoids, few small divertic in left colon, otherwise normal colon (repeat 10 yrs)    ESOPHAGOGASTRODUODENOSCOPY  09/2012   Normal (Dr. Christella Hartigan)   LEEP  11/2004    Social History   Socioeconomic History   Marital status: Divorced    Spouse name: Not on file   Number of children: 1   Years of education: 9   Highest education level: 9th grade  Occupational History    Comment: Retired  Tobacco Use   Smoking status: Never   Smokeless tobacco: Never  Vaping Use   Vaping status: Never Used  Substance and Sexual Activity   Alcohol use: No   Drug use: No   Sexual activity: Not Currently    Birth control/protection: Post-menopausal  Other Topics Concern   Not on file  Social History Narrative   Lives with her dog. Her son lives about 28 miles from her; her sister close by. She enjoys shopping and exercising.   Social Determinants of Health   Financial Resource Strain: Low Risk  (08/03/2022)   Overall Financial Resource Strain (CARDIA)    Difficulty of Paying Living Expenses: Not hard at all  Food Insecurity: No Food Insecurity (08/03/2022)   Hunger Vital Sign    Worried About Running Out of Food in the Last Year: Never true    Ran Out of Food in the Last Year: Never true  Transportation Needs: No Transportation Needs (08/03/2022)   PRAPARE - Administrator, Civil Service (Medical): No    Lack of Transportation (Non-Medical): No  Physical Activity: Insufficiently Active (08/03/2022)   Exercise Vital Sign    Days of Exercise per Week: 7 days    Minutes of Exercise per Session: 10 min  Stress: No Stress Concern Present (08/03/2022)   [employer] of Occupational Health - Occupational Stress Questionnaire    Feeling of Stress : Not at all  Social Connections: Moderately Isolated (08/03/2022)   Social Connection and Isolation Panel [NHANES]    Frequency of Communication with Friends and Family: More than three times a week    Frequency of Social Gatherings with Friends and Family: Twice a week    Attends Religious Services: More than 4 times per year     Active Member of [employer] or Organizations: No    Attends Banker Meetings: Never    Marital Status: Divorced  Catering manager Violence: Not At Risk (08/03/2022)   Humiliation, Afraid, Rape, and Kick questionnaire    Fear of Current or Ex-Partner: No    Emotionally Abused: No    Physically Abused: No    Sexually Abused: No    Family History  Problem Relation Age of Onset   Kidney cancer Mother 36   COPD Mother    Kidney disease Mother    Allergic rhinitis Mother    Asthma Mother    Heart disease Father    Allergic rhinitis Father    COPD Sister    Depression  Sister    Pancreatic cancer Sister    Hypothyroidism Sister    Hypothyroidism Sister    Heart disease Brother    Heart attack Brother    Depression Maternal Aunt    Heart disease Paternal Uncle    Depression Maternal Grandfather    Diabetes Other        neice   Thyroid disease Other        neice   Colon cancer Neg Hx    Rectal cancer Neg Hx    Stomach cancer Neg Hx    Colon polyps Neg Hx    Esophageal cancer Neg Hx     ROS: no fevers or chills, productive cough, hemoptysis, dysphasia, odynophagia, melena, hematochezia, dysuria, hematuria, rash, seizure activity, orthopnea, PND, pedal edema, claudication. Remaining systems are negative.  Physical Exam:   Blood pressure 102/62, pulse 75, height 5\' 6"  (1.676 m), weight 105 lb 6.4 oz (47.8 kg), SpO2 98%.  General:  Well developed/well nourished in NAD Skin warm/dry Patient not depressed No peripheral clubbing Back-normal HEENT-normal/normal eyelids Neck supple/normal carotid upstroke bilaterally; no bruits; no JVD; no thyromegaly chest - CTA/ normal expansion CV - RRR/normal S1 and S2; no murmurs, rubs or gallops;  PMI nondisplaced Abdomen -NT/ND, no HSM, no mass, + bowel sounds, no bruit 2+ femoral pulses, no bruits Ext-no edema, chords, 2+ DP Neuro-grossly nonfocal  EKG Interpretation Date/Time:  Monday February 04 2023 10:45:53 EDT Ventricular  Rate:  75 PR Interval:  170 QRS Duration:  94 QT Interval:  390 QTC Calculation: 435 R Axis:   81  Text Interpretation: Normal sinus rhythm Incomplete right bundle branch block When compared with ECG of 08-Feb-2018 05:36, PREVIOUS ECG IS PRESENT Confirmed by Olga Millers ([id number]) on 02/04/2023 10:47:45 AM    A/P  1 chest pain-symptoms are atypical but she is very concerned.  We will arrange a cardiac CTA for full evaluation.  2 hyperlipidemia-LDL and LP(a) are elevated.  We discussed options today and I noted that she had some degree of calcification in her coronary arteries on previous calcium score.  I recommended PCSK9 inhibitor but she declined.  We also discussed initiating a statin and she also declined.  Continue diet.  3 aortic insufficiency-mild to moderate on previous echocardiogram.  Will repeat.  Olga Millers, MD

## 2023-02-04 ENCOUNTER — Ambulatory Visit (INDEPENDENT_AMBULATORY_CARE_PROVIDER_SITE_OTHER): Payer: Medicare HMO | Admitting: Cardiology

## 2023-02-04 ENCOUNTER — Encounter: Payer: Self-pay | Admitting: Cardiology

## 2023-02-04 VITALS — BP 102/62 | HR 75 | Ht 66.0 in | Wt 105.4 lb

## 2023-02-04 DIAGNOSIS — E7800 Pure hypercholesterolemia, unspecified: Secondary | ICD-10-CM | POA: Diagnosis not present

## 2023-02-04 DIAGNOSIS — R9431 Abnormal electrocardiogram [ECG] [EKG]: Secondary | ICD-10-CM

## 2023-02-04 DIAGNOSIS — R072 Precordial pain: Secondary | ICD-10-CM | POA: Diagnosis not present

## 2023-02-04 DIAGNOSIS — I351 Nonrheumatic aortic (valve) insufficiency: Secondary | ICD-10-CM

## 2023-02-04 MED ORDER — METOPROLOL TARTRATE 25 MG PO TABS: ORAL_TABLET | ORAL | 0 refills | Status: DC

## 2023-02-04 NOTE — Patient Instructions (Addendum)
Testing/Procedures:  Your physician has requested that you have an echocardiogram. Echocardiography is a painless test that uses sound waves to create images of your heart. It provides your doctor with information about the size and shape of your heart and how well your heart's chambers and valves are working. This procedure takes approximately one hour. There are no restrictions for this procedure. Please do NOT wear cologne, perfume, aftershave, or lotions (deodorant is allowed). Please arrive 15 minutes prior to your appointment time. [facility]-1 ST FLOOR IMAGING DEPARTMENT    Your cardiac CT will be scheduled at   [facility] [address] [place], [place] [address] [phone]   If scheduled at [facility], please arrive at the [facility] and Children's Entrance (Entrance C2) of [facility] 30 minutes prior to test start time. You can use the FREE valet parking offered at entrance C (encouraged to control the heart rate for the test)  Proceed to the [facility] Radiology Department (first floor) to check-in and test prep.  All radiology patients and guests should use entrance C2 at [facility], accessed from [facility], even though the hospital's physical address listed is [address].      Please follow these instructions carefully (unless otherwise directed):  An IV will be required for this test and Nitroglycerin will be given.    On the Night Before the Test: Be sure to Drink plenty of water. Do not consume any caffeinated/decaffeinated beverages or chocolate 12 hours prior to your test. Do not take any antihistamines 12 hours prior to your test.  On the Day of the Test: Drink plenty of water until 1 hour prior to the test. Do not eat any food 1 hour prior to test. You may take your regular medications prior to the test.  FEMALES- please wear underwire-free bra if available, avoid dresses &  tight clothing        After the Test: Drink plenty of water. After receiving IV contrast, you may experience a mild flushed feeling. This is normal. On occasion, you may experience a mild rash up to 24 hours after the test. This is not dangerous. If this occurs, you can take Benadryl 25 mg and increase your fluid intake. If you experience trouble breathing, this can be serious. If it is severe call 911 IMMEDIATELY. If it is mild, please call our office.   We will call to schedule your test 2-4 weeks out understanding that some insurance companies will need an authorization prior to the service being performed.   For more information and frequently asked questions, please visit our website : [email or web address]  For non-scheduling related questions, please contact the cardiac imaging nurse navigator should you have any questions/concerns: Cardiac Imaging Nurse Navigators Direct Office Dial: [phone]   For scheduling needs, including cancellations and rescheduling, please call [place], [phone].    Follow-Up: At [facility], you and your health needs are our priority.  As part of our continuing mission to provide you with exceptional heart care, we have created designated Provider Care Teams.  These Care Teams include your primary Cardiologist (physician) and Advanced Practice Providers (APPs -  Physician Assistants and Nurse Practitioners) who all work together to provide you with the care you need, when you need it.  We recommend signing up for the patient portal called "MyChart".  Sign up information is provided on this After Visit Summary.  MyChart is used to connect with  patients for Virtual Visits (Telemedicine).  Patients are able to view lab/test results, encounter notes, upcoming appointments, etc.  Non-urgent messages can be sent to your provider as well.   To learn more about what you can do with MyChart, go to [email or web address].    Your  next appointment:   12 month(s)  Provider:   Olga Millers, MD

## 2023-02-25 ENCOUNTER — Other Ambulatory Visit (HOSPITAL_COMMUNITY): Payer: Self-pay | Admitting: Psychiatry

## 2023-02-25 ENCOUNTER — Other Ambulatory Visit: Payer: Self-pay | Admitting: Physician Assistant

## 2023-02-25 DIAGNOSIS — M5030 Other cervical disc degeneration, unspecified cervical region: Secondary | ICD-10-CM

## 2023-02-25 DIAGNOSIS — M5412 Radiculopathy, cervical region: Secondary | ICD-10-CM

## 2023-02-28 ENCOUNTER — Telehealth: Payer: Self-pay | Admitting: Cardiology

## 2023-02-28 DIAGNOSIS — R072 Precordial pain: Secondary | ICD-10-CM

## 2023-02-28 DIAGNOSIS — E7800 Pure hypercholesterolemia, unspecified: Secondary | ICD-10-CM

## 2023-02-28 DIAGNOSIS — R9431 Abnormal electrocardiogram [ECG] [EKG]: Secondary | ICD-10-CM

## 2023-02-28 NOTE — Telephone Encounter (Signed)
-----   Message from Olga Millers sent at 02/28/2023  7:28 AM EDT ----- Regarding: RE: ct scan Schedule stress nuclear study Olga Millers ----- Message ----- From: Lindell Spar, RN Sent: 02/27/2023   4:41 PM EDT To: Lewayne Bunting, MD Subject: FW: ct scan                                    FYI - please advise if a different test is needed. She is scheduled for echo. Thanks ----- Message ----- From: Lorrin Jackson Sent: 02/18/2023   2:43 PM EDT To: Freddi Starr, RN Subject: ct scan                                        Good Afternoon,  She refused to schedule her ct scan. She said she read up on the medication and the contrast dye and do not feel comfortable to do this test at all. She wanted something else to do if possible. She will continue to do her echo.  Thanks, [place]

## 2023-02-28 NOTE — Telephone Encounter (Signed)
MyChart message sent to patient re: stress test, per MD

## 2023-03-01 NOTE — Addendum Note (Signed)
Addended by: Lindell Spar on: 03/01/2023 09:50 AM   Modules accepted: Orders

## 2023-03-01 NOTE — Addendum Note (Signed)
Addended by: Lewayne Bunting on: 03/01/2023 10:08 AM   Modules accepted: Orders

## 2023-03-01 NOTE — Telephone Encounter (Signed)
Patient reviewed MyChart message Last read by [name] at 10:02 AM on 02/28/2023.    Stress test ordered Routed to MD to sign attestation

## 2023-03-02 ENCOUNTER — Emergency Department (HOSPITAL_COMMUNITY): Payer: Medicare HMO

## 2023-03-02 ENCOUNTER — Other Ambulatory Visit: Payer: Self-pay

## 2023-03-02 ENCOUNTER — Emergency Department (HOSPITAL_COMMUNITY)
Admission: EM | Admit: 2023-03-02 | Discharge: 2023-03-02 | Disposition: A | Discharge status: Home / Self Care | Payer: Medicare HMO | Attending: Emergency Medicine | Admitting: Emergency Medicine

## 2023-03-02 DIAGNOSIS — R519 Headache, unspecified: Secondary | ICD-10-CM | POA: Diagnosis not present

## 2023-03-02 DIAGNOSIS — Z1152 Encounter for screening for COVID-19: Secondary | ICD-10-CM | POA: Insufficient documentation

## 2023-03-02 DIAGNOSIS — R1032 Left lower quadrant pain: Secondary | ICD-10-CM | POA: Diagnosis present

## 2023-03-02 DIAGNOSIS — R109 Unspecified abdominal pain: Secondary | ICD-10-CM | POA: Diagnosis not present

## 2023-03-02 DIAGNOSIS — K5792 Diverticulitis of intestine, part unspecified, without perforation or abscess without bleeding: Secondary | ICD-10-CM | POA: Diagnosis not present

## 2023-03-02 DIAGNOSIS — K5732 Diverticulitis of large intestine without perforation or abscess without bleeding: Secondary | ICD-10-CM | POA: Diagnosis not present

## 2023-03-02 DIAGNOSIS — K7689 Other specified diseases of liver: Secondary | ICD-10-CM | POA: Diagnosis not present

## 2023-03-02 LAB — COMPREHENSIVE METABOLIC PANEL
ALT: 15 U/L (ref 0–44)
AST: 20 U/L (ref 15–41)
Albumin: 4 g/dL (ref 3.5–5.0)
Alkaline Phosphatase: 43 U/L (ref 38–126)
Anion gap: 8 (ref 5–15)
BUN: 15 mg/dL (ref 8–23)
CO2: 27 mmol/L (ref 22–32)
Calcium: 8.9 mg/dL (ref 8.9–10.3)
Chloride: 101 mmol/L (ref 98–111)
Creatinine, Ser: 0.78 mg/dL (ref 0.44–1.00)
GFR, Estimated: >60 mL/min (ref >60)
Glucose, Bld: 91 mg/dL (ref 70–99)
Potassium: 4.1 mmol/L (ref 3.5–5.1)
Sodium: 136 mmol/L (ref 135–145)
Total Bilirubin: 0.7 mg/dL (ref 0.3–1.2)
Total Protein: 6.5 g/dL (ref 6.5–8.1)

## 2023-03-02 LAB — URINALYSIS, ROUTINE W REFLEX MICROSCOPIC
Bilirubin Urine: NEGATIVE
Glucose, UA: NEGATIVE mg/dL
Hgb urine dipstick: NEGATIVE
Ketones, ur: 5 mg/dL — AB
Leukocytes,Ua: NEGATIVE
Nitrite: NEGATIVE
Protein, ur: NEGATIVE mg/dL
Specific Gravity, Urine: 1.017 (ref 1.005–1.030)
pH: 6 (ref 5.0–8.0)

## 2023-03-02 LAB — CBC
HCT: 36.5 % (ref 36.0–46.0)
Hemoglobin: 12.3 g/dL (ref 12.0–15.0)
MCH: 31.6 pg (ref 26.0–34.0)
MCHC: 33.7 g/dL (ref 30.0–36.0)
MCV: 93.8 fL (ref 80.0–100.0)
Platelets: 263 10*3/uL (ref 150–400)
RBC: 3.89 MIL/uL (ref 3.87–5.11)
RDW: 12.3 % (ref 11.5–15.5)
WBC: 7.6 10*3/uL (ref 4.0–10.5)
nRBC: 0 % (ref 0.0–0.2)

## 2023-03-02 LAB — LIPASE, BLOOD: Lipase: 35 U/L (ref 11–51)

## 2023-03-02 LAB — SARS CORONAVIRUS 2 BY RT PCR: SARS Coronavirus 2 by RT PCR: NEGATIVE

## 2023-03-02 MED ORDER — IOHEXOL 300 MG/ML  SOLN
100.0000 mL | Freq: Once | INTRAMUSCULAR | Status: AC | PRN
  Administered 2023-03-02: 100 mL via INTRAVENOUS

## 2023-03-02 MED ORDER — AMOXICILLIN-POT CLAVULANATE 875-125 MG PO TABS: 1.0000 | ORAL_TABLET | Freq: Two times a day (BID) | ORAL | 0 refills | Status: DC

## 2023-03-02 MED ORDER — PROCHLORPERAZINE EDISYLATE 10 MG/2ML IJ SOLN
10.0000 mg | Freq: Once | INTRAMUSCULAR | Status: DC
  Filled 2023-03-02: qty 2

## 2023-03-02 MED ORDER — DIPHENHYDRAMINE HCL 50 MG/ML IJ SOLN
25.0000 mg | Freq: Once | INTRAMUSCULAR | Status: DC
  Filled 2023-03-02: qty 1

## 2023-03-02 NOTE — ED Notes (Signed)
Patient transported to CT 

## 2023-03-02 NOTE — ED Provider Notes (Signed)
[place] [facility]ERGENC[facility] DEPARTMENT AT [facility] Provider Note   CSN: [id number] Arrival date & time: 03/02/23  8657     History  Chief Complaint  Patient presents with   Abdominal Pain    Kellie [name] is a 67 y.o. female.  HPI     67 year old female comes in with chief complaint of abdominal pain. Patient has history of mitral valve prolapse, diverticulosis. She indicates that she has been having suprapubic abdominal pain for the last 4 days.  The pain is intermittent, but fairly constant.  It would last for few minutes and then resolved.  Pain is described as sharp.  Pain is nonradiating.  She has had diverticulitis in the past, but the pain was left sided.  She has been having bowel movement, but they have not been normal.  No blood in the stool.  Patient denies any UTI-like symptoms.  Home Medications Prior to Admission medications   Medication Sig Start Date End Date Taking? Authorizing Provider  amoxicillin-clavulanate (AUGMENTIN) 875-125 MG tablet Take 1 tablet by mouth every 12 (twelve) hours. 03/02/23  Yes Triniti Gruetzmacher, MD  acyclovir (ZOVIRAX) 200 MG capsule TAKE 2 CAPSULES BY MOUTH TWICE DAILY. 07/30/22   Breeback, Lonna Cobb, PA-C  albuterol (VENTOLIN HFA) 108 (90 Base) MCG/ACT inhaler Inhale 2 puffs into the lungs every 4 (four) hours as needed for wheezing or shortness of breath. 01/14/23   Breeback, Jade L, PA-C  alendronate (FOSAMAX) 70 MG tablet Take by mouth. 09/02/18   [provider]  ALPRAZolam Prudy Feeler) 0.5 MG tablet Take 1 tablet (0.5 mg total) by mouth daily as needed for anxiety. 12/28/22   Agapito Games, MD  AMBULATORY NON FORMULARY MEDICATION Incentive spirometer use as directed - fax to [facility] [address] [phone] 01/31/21   Sunnie Nielsen, DO  ascorbic acid (VITAMIN C) 100 MG tablet Take 1 tablet by mouth daily.    [provider]  atorvastatin (LIPITOR) 10 MG tablet Take 1 tablet by mouth daily. Patient not  taking: Reported on 12/12/2022 09/20/20   [provider]  cetirizine (ZYRTEC) 10 MG tablet Take by mouth.    [provider]  Cholecalciferol (VITAMIN D) 2000 units CAPS Take by mouth.    [provider]  citalopram (CELEXA) 10 MG tablet TAKE (1) TABLET BY MOUTH ONCE DAILY. 02/26/23   Thresa Ross, MD  cyclobenzaprine (FLEXERIL) 10 MG tablet One half to one tab PO qHS 10/29/19   Darreld Mclean, MD  famotidine (PEPCID) 20 MG tablet Take 1 tablet (20 mg total) by mouth at bedtime. 11/06/22   Breeback, Lonna Cobb, PA-C  fexofenadine (ALLEGRA) 180 MG tablet Take 1 tablet (180 mg total) by mouth daily. 11/06/22   Breeback, Jade L, PA-C  gabapentin (NEURONTIN) 100 MG capsule TAKE 1 CAPSULE BY MOUTH THREE TIMES A DAY. 02/26/23   Thresa Ross, MD  hydrocortisone (ANUSOL-HC) 2.5 % rectal cream Place 1 Application rectally 2 (two) times daily as needed for hemorrhoids or anal itching. 11/21/22   Agapito Games, MD  Menaquinone-7 (VITAMIN K2) 100 MCG CAPS Take by mouth. 12/18/21   [provider]  metoprolol tartrate (LOPRESSOR) 25 MG tablet TAKE 2 HOURS PRIOR TO CT SCAN 02/04/23   Lewayne Bunting, MD  pantoprazole (PROTONIX) 40 MG tablet TAKE (1) TABLET BY MOUTH TWICE DAILY. 02/28/23   Breeback, Jade L, PA-C  Probiotic Product (PROBIOTIC & ACIDOPHILUS EX ST PO) Take by mouth.     [provider]  Allergies    Codeine, Hydrocodone, Morphine, Abilify [aripiprazole], Morphine and codeine, Percocet [oxycodone-acetaminophen], and Vicodin [hydrocodone-acetaminophen]    Review of Systems   Review of Systems  All other systems reviewed and are negative.   Physical Exam Updated Vital Signs BP 114/63 (BP Location: Left Arm)   Pulse 69   Temp 98.2 F (36.8 C) (Oral)   Resp 17   Ht 5\' 5"  (1.651 m)   Wt 46.7 kg   SpO2 97%   BMI 17.14 kg/m  Physical Exam Vitals and nursing note reviewed.  Constitutional:      Appearance: She is well-developed.  HENT:      Head: Atraumatic.  Cardiovascular:     Rate and Rhythm: Normal rate.  Pulmonary:     Effort: Pulmonary effort is normal.  Abdominal:     Tenderness: There is abdominal tenderness in the suprapubic area and left lower quadrant. There is no guarding or rebound.  Musculoskeletal:     Cervical back: Normal range of motion and neck supple.  Skin:    General: Skin is warm and dry.  Neurological:     Mental Status: She is alert and oriented to person, place, and time.     ED Results / Procedures / Treatments   Labs (all labs ordered are listed, but only abnormal results are displayed) Labs Reviewed  URINALYSIS, ROUTINE W REFLEX MICROSCOPIC - Abnormal; Notable for the following components:      Result Value   Ketones, ur 5 (*)    All other components within normal limits  SARS CORONAVIRUS 2 BY RT PCR  LIPASE, BLOOD  COMPREHENSIVE METABOLIC PANEL  CBC    EKG None  Radiology CT ABDOMEN PELVIS W CONTRAST  Result Date: 03/02/2023 CLINICAL DATA:  Acute abdominal pain and dizziness for 3 days. EXAM: CT ABDOMEN AND PELVIS WITH CONTRAST TECHNIQUE: Multidetector CT imaging of the abdomen and pelvis was performed using the standard protocol following bolus administration of intravenous contrast. RADIATION DOSE REDUCTION: This exam was performed according to the departmental dose-optimization program which includes automated exposure control, adjustment of the mA and/or kV according to patient size and/or use of iterative reconstruction technique. CONTRAST:  OMNIPAQUE IOHEXOL 300 MG/ML  SOLN COMPARISON:  09/04/2021 FINDINGS: Lower Chest: No acute findings. Hepatobiliary: No suspicious hepatic masses identified. Stable tiny sub-cm cysts in left hepatic lobe. Gallbladder is unremarkable. No evidence of biliary ductal dilatation. Pancreas:  No mass or inflammatory changes. Spleen: Within normal limits in size and appearance. Adrenals/Urinary Tract: No suspicious masses identified. No evidence of  ureteral calculi or hydronephrosis. Stomach/Bowel: Mild diverticulitis is seen involving the distal sigmoid colon. No evidence of perforation or abscess. Normal appendix visualized. Vascular/Lymphatic: No pathologically enlarged lymph nodes. No acute vascular findings. Reproductive:  No mass or other significant abnormality. Other:  None. Musculoskeletal:  No suspicious bone lesions identified. IMPRESSION: Mild distal sigmoid diverticulitis. No evidence of perforation or abscess. Electronically Signed   By: Danae Orleans M.D.   On: 03/02/2023 13:20   CT Head Wo Contrast  Result Date: 03/02/2023 CLINICAL DATA:  Headache, new onset (Age >= 51y) EXAM: CT HEAD WITHOUT CONTRAST TECHNIQUE: Contiguous axial images were obtained from the base of the skull through the vertex without intravenous contrast. RADIATION DOSE REDUCTION: This exam was performed according to the departmental dose-optimization program which includes automated exposure control, adjustment of the mA and/or kV according to patient size and/or use of iterative reconstruction technique. COMPARISON:  CT Head 04/21/21 FINDINGS: Brain: No evidence of acute  infarction, hemorrhage, hydrocephalus, extra-axial collection or mass lesion/mass effect. Vascular: No hyperdense vessel or unexpected calcification. Skull: Normal. Negative for fracture or focal lesion. Sinuses/Orbits: No middle ear or mastoid effusion. Paranasal sinuses are notable for mucosal thickening in the posterior ethmoid air cells on the right. Other: None. IMPRESSION: No CT etiology for headaches identified Electronically Signed   By: Lorenza Cambridge M.D.   On: 03/02/2023 12:04   DG Abdomen 1 View  Result Date: 03/02/2023 CLINICAL DATA:  Abdominal pain. EXAM: ABDOMEN - 1 VIEW COMPARISON:  Sacrum coccyx radiographs 12/25/2016. CT of the abdomen pelvis 09/04/2021 FINDINGS: The bowel gas pattern is normal. No radio-opaque calculi or other significant radiographic abnormality are seen. IMPRESSION:  Negative one view abdomen. Electronically Signed   By: Marin Roberts M.D.   On: 03/02/2023 10:38    Procedures Procedures    Medications Ordered in ED Medications  prochlorperazine (COMPAZINE) injection 10 mg (10 mg Intravenous Not Given 03/02/23 1131)  diphenhydrAMINE (BENADRYL) injection 25 mg (25 mg Intravenous Not Given 03/02/23 1131)  iohexol (OMNIPAQUE) 300 MG/ML solution 100 mL (100 mLs Intravenous Contrast Given 03/02/23 1140)    ED Course/ Medical Decision Making/ A&P Clinical Course as of 03/02/23 1433  Sat Mar 02, 2023  1207 Patient reassessed after initial workup.  The initial workup is reassuring. She thereafter tells me that her pain has been constant, it was worse at night, she could not sleep.  Thereafter she when she went to the bathroom, she started getting sweaty and felt like she was going to faint.  She also has had some headaches that are new.  Plan is to get CT head without contrast.  Headache cocktail ordered.  She still has suprapubic tenderness, but it is not severe.  We will get COVID-19 test and also CT abdomen pelvis with contrast.  If workup is reassuring then she can be discharged. [AN]    Clinical Course User Index [AN] Derwood Kaplan, MD                                 Medical Decision Making Amount and/or Complexity of Data Reviewed Labs: ordered. Radiology: ordered.  Risk Prescription drug management.   This patient presents to the ED with chief complaint(s) of suprapubic abdominal pain with pertinent past medical history of diverticulosis, pelvic mass. Initial exam is overall reassuring besides suprapubic pain.  The complaint involves an extensive differential diagnosis and also carries with it a high risk of complications and morbidity.    The differential diagnosis includes acute diverticulitis, acute cystitis, kidney stone. After reviewing patient's records, which indicates that she has endometrial mass, that could be possibly  contributing to the pain.  The initial plan is to get basic labs.  Patient thinks that she could be constipated, therefore KUB ordered.   Additional history obtained: Records reviewed Primary Care Documents, previous CT scan and ultrasound of the abdomen within the last year  Independent labs interpretation:  The following labs were independently interpreted: CBC, and urine analysis are reassuring.  Independent visualization and interpretation of imaging: - I independently visualized the following imaging with scope of interpretation limited to determining acute life threatening conditions related to emergency care: CT scan of the brain, which revealed no evidence of brain bleed  Treatment and Reassessment: Patient reassessed.  CT head negative.  COVID-negative.  CT abdomen pelvis shows diverticulitis.  We will give her Augmentin.   Final Clinical Impression(s) /  ED Diagnoses Final diagnoses:  Diverticulitis    Rx / DC Orders ED Discharge Orders          Ordered    amoxicillin-clavulanate (AUGMENTIN) 875-125 MG tablet  Every 12 hours        03/02/23 1431              Derwood Kaplan, MD 03/02/23 1433

## 2023-03-02 NOTE — Discharge Instructions (Addendum)
The scan confirms that you have diverticulitis.  Take the antibiotics.

## 2023-03-02 NOTE — ED Triage Notes (Signed)
Pt c/o abd pain, headache, dizziness x 3 days. Pt also c/o of all extremities tingling.

## 2023-03-04 ENCOUNTER — Ambulatory Visit (HOSPITAL_BASED_OUTPATIENT_CLINIC_OR_DEPARTMENT_OTHER)
Admission: RE | Admit: 2023-03-04 | Discharge: 2023-03-04 | Disposition: A | Discharge status: Home / Self Care | Payer: Medicare HMO | Source: Ambulatory Visit | Attending: Cardiology | Admitting: Cardiology

## 2023-03-04 ENCOUNTER — Encounter: Payer: Self-pay | Admitting: Physician Assistant

## 2023-03-04 DIAGNOSIS — I351 Nonrheumatic aortic (valve) insufficiency: Secondary | ICD-10-CM | POA: Insufficient documentation

## 2023-03-05 ENCOUNTER — Encounter (HOSPITAL_COMMUNITY): Payer: Medicare HMO

## 2023-03-05 LAB — ECHOCARDIOGRAM COMPLETE
AR max vel: 2.73 cm2
AV Area VTI: 2.75 cm2
AV Area mean vel: 2.63 cm2
AV Mean grad: 4 mmHg
AV Peak grad: 6.7 mmHg
AV Vena cont: 0.3 cm
Ao pk vel: 1.29 m/s
Area-P 1/2: 4.29 cm2
Calc EF: 63.4 %
P 1/2 time: 1429 ms
Single Plane A2C EF: 72.1 %
Single Plane A4C EF: 56.6 %

## 2023-03-13 ENCOUNTER — Ambulatory Visit (INDEPENDENT_AMBULATORY_CARE_PROVIDER_SITE_OTHER): Payer: Medicare HMO | Admitting: Physician Assistant

## 2023-03-13 ENCOUNTER — Encounter: Payer: Self-pay | Admitting: Physician Assistant

## 2023-03-13 VITALS — BP 111/69 | HR 75 | Ht 65.0 in | Wt 104.0 lb

## 2023-03-13 DIAGNOSIS — E7800 Pure hypercholesterolemia, unspecified: Secondary | ICD-10-CM | POA: Diagnosis not present

## 2023-03-13 DIAGNOSIS — R636 Underweight: Secondary | ICD-10-CM | POA: Diagnosis not present

## 2023-03-13 DIAGNOSIS — Z13820 Encounter for screening for osteoporosis: Secondary | ICD-10-CM

## 2023-03-13 DIAGNOSIS — Z8719 Personal history of other diseases of the digestive system: Secondary | ICD-10-CM | POA: Diagnosis not present

## 2023-03-13 DIAGNOSIS — R824 Acetonuria: Secondary | ICD-10-CM | POA: Insufficient documentation

## 2023-03-13 LAB — POCT URINALYSIS DIP (CLINITEK)
Bilirubin, UA: NEGATIVE
Blood, UA: NEGATIVE
Glucose, UA: NEGATIVE mg/dL
Ketones, POC UA: NEGATIVE mg/dL
Leukocytes, UA: NEGATIVE
Nitrite, UA: NEGATIVE
POC PROTEIN,UA: NEGATIVE
Spec Grav, UA: 1.02 (ref 1.010–1.025)
Urobilinogen, UA: 0.2 U/dL
pH, UA: 5.5 (ref 5.0–8.0)

## 2023-03-13 NOTE — Progress Notes (Signed)
Established Patient Office Visit  Subjective   Patient ID: Kellie [name], female    DOB: Jan 04, 1956  Age: 67 y.o. MRN: [id number]  No chief complaint on file.   HPI Patient is a 67 year old underweight female who presents to the clinic to follow-up after ED visit for diverticulitis on 03/02/2023.  She was treated with Augmentin.  She has only been taking it once a day and has a few doses left.  She does feel better.  She denies any abdominal pain.  She is concerned with ketones that were found in urine.  She wants to make sure those have resolved.  She has been ongoing concerned with her weight.  She feels like she eats all the time and cannot gain weight.  She wonders if there is any thing that can be tested or anything she can do for this.  She has not been taking her cholesterol medication but she has been changing her diet and wants to see what her cholesterol is today.  .. Active Ambulatory Problems    Diagnosis Date Noted   GERD (gastroesophageal reflux disease) 09/27/2010   Allergic rhinitis 11/08/2010   Insomnia 11/08/2010   Chronic cough 06/02/2011   Osteoporosis 11/21/2011   Borderline intellectual functioning 02/15/2012   Genital herpes 11/05/2012   Hemorrhoids 11/05/2012   Chronic periodontal disease 06/30/2013   Adhesive capsulitis of right shoulder 11/24/2013   Encounter for gynecological examination with Papanicolaou smear of cervix 12/08/2013   Prediabetes 12/14/2013   Postmenopausal atrophic vaginitis 03/17/2014   Bipolar disorder with depression (HCC) 06/02/2014   Family history of renal cancer 10/11/2014   DDD (degenerative disc disease), lumbar 10/11/2014   Rhinitis, allergic 10/14/2014   Near syncope 02/09/2015   Chronic nasal congestion 03/01/2015   Vitreous floaters of right eye 03/01/2015   DDD (degenerative disc disease), cervical 07/05/2015   Cough, persistent 07/21/2015   Post-nasal drip 07/21/2015   Left knee pain 11/09/2015   Chronic pain of both  knees 11/09/2015   Plantar fasciitis, bilateral 11/09/2015   Hoarseness 03/09/2016   Pulmonary nodule 08/08/2016   Bipolar 1 disorder with moderate mania (HCC) 09/17/2016   Panic attacks 09/18/2016   SOB (shortness of breath) 09/18/2016   Weakness 10/05/2016   Family history of pancreatic cancer 02/18/2017   Diarrhea 02/18/2017   Loss of weight 02/18/2017   Unintentional weight loss 05/18/2017   Elevated LDL cholesterol level 05/18/2017   Hyperlipidemia 05/18/2017   Irritable bowel syndrome with both constipation and diarrhea 05/18/2017   History of loop electrical excision procedure (LEEP) 07/01/2017   Atypical chest pain 10/22/2017   Anxiety 10/22/2017   Aortic insufficiency 01/10/2018   Non-restorative sleep 01/10/2018   Snoring 01/10/2018   Dermatochalasis of both upper eyelids 03/13/2018   Periodic limb movement 03/13/2018   Nocturnal leg cramps 03/13/2018   Vaginal dryness 09/02/2018   History of diverticulitis 09/16/2018   Chronic pain of both ankles 01/15/2019   Cervical stenosis (uterine cervix) 02/13/2019   Vitamin D insufficiency 02/16/2019   Plantar fasciitis, right 04/02/2019   Reactive airway disease without asthma 04/17/2019   Cortical age-related cataract of both eyes 12/22/2018   Pinguecula of both eyes 12/22/2018   Nuclear sclerotic cataract of both eyes 12/22/2018   Anterolisthesis 07/28/2019   Vitamin D deficiency 12/02/2019   No energy 12/02/2019   Rectal bleeding 12/02/2019   Dysphagia 12/02/2019   Tenderness of neck 12/02/2019   Hyperinflation of lungs 01/15/2020   History of iron deficiency 02/15/2020   Globin  abnormality (HCC) 02/19/2020   Hiatal hernia 02/19/2020   Gastroesophageal reflux disease with esophagitis 02/19/2020   Belching 03/23/2020   Leukopenia 06/21/2020   Low serum calcium 06/21/2020   Breast pain, right 07/11/2020   Nonrheumatic aortic valve insufficiency 09/20/2020   Bilateral leg pain 09/20/2020   Numbness and tingling of  both feet 09/20/2020   Family history of stroke 09/20/2020   Ankle edema, bilateral 09/20/2020   Anxiety about health 09/20/2020   Cervical radiculopathy 05/03/2021   Left thyroid nodule 06/20/2021   Elevated lipase 08/25/2021   Elevated TSH 08/25/2021   Nausea and vomiting 09/08/2021   Upper abdominal pain 09/08/2021   Agatston coronary artery calcium score less than 100 11/20/2022   Elevated lipoprotein A level 11/20/2022   Endometrial mass 11/26/2022   Ketonuria 03/13/2023   Underweight 03/13/2023   Resolved Ambulatory Problems    Diagnosis Date Noted   Sinusitis 09/27/2010   Depression 09/27/2010   Bronchitis 12/04/2010   General medical examination 10/18/2011   Dizziness 11/21/2011   Knee pain 11/21/2011   Low back pain 11/21/2011   Heel pain 07/14/2012   Thoracic myofascial strain 03/13/2013   Sinusitis 04/20/2013   Cough 04/22/2013   Rotator cuff disorder 08/01/2013   Right shoulder pain 10/27/2013   Adhesive capsulitis 11/02/2013   Pain in joint, shoulder region 11/24/2013   Bipolar disorder (HCC) 04/29/2014   Right serous otitis media 08/03/2014   Acute recurrent maxillary sinusitis 10/14/2014   Right-sided low back pain without sciatica 10/14/2014   Sinusitis 02/09/2015   Sacroiliac joint dysfunction of right side 02/25/2015   Piriformis syndrome 03/01/2015   Adhesive capsulitis 07/05/2015   Radiculitis of right cervical region 07/06/2015   Drug reaction 09/25/2016   Mitral valve prolapse 01/10/2018   Past Medical History:  Diagnosis Date   Abnormal Pap smear of cervix    Allergic rhinitis 11/08/2010   Asthma    Herpes simplex of female genitalia    Thyroid nodule    Urticaria     ROS See HPI.    Objective:     BP 111/69   Pulse 75   Ht 5\' 5"  (1.651 m)   Wt 104 lb (47.2 kg)   SpO2 100%   BMI 17.31 kg/m  BP Readings from Last 3 Encounters:  03/13/23 111/69  03/02/23 114/63  02/04/23 102/62   Wt Readings from Last 3 Encounters:   03/13/23 104 lb (47.2 kg)  03/02/23 103 lb (46.7 kg)  02/04/23 105 lb 6.4 oz (47.8 kg)      Physical Exam Constitutional:      Appearance: Normal appearance.  HENT:     Head: Normocephalic.  Cardiovascular:     Rate and Rhythm: Normal rate and regular rhythm.  Pulmonary:     Effort: Pulmonary effort is normal.     Breath sounds: Normal breath sounds.  Abdominal:     General: Bowel sounds are normal. There is no distension.     Palpations: Abdomen is soft. There is no mass.     Tenderness: There is no abdominal tenderness. There is no right CVA tenderness, left CVA tenderness, guarding or rebound.     Hernia: No hernia is present.  Musculoskeletal:     Right lower leg: No edema.     Left lower leg: No edema.  Neurological:     General: No focal deficit present.     Mental Status: She is alert and oriented to person, place, and time.  Psychiatric:  Mood and Affect: Mood normal.      Results for orders placed or performed in visit on 03/13/23  POCT URINALYSIS DIP (CLINITEK)  Result Value Ref Range   Color, UA yellow yellow   Clarity, UA clear clear   Glucose, UA negative negative mg/dL   Bilirubin, UA negative negative   Ketones, POC UA negative negative mg/dL   Spec Grav, UA 1.610 9.604 - 1.025   Blood, UA negative negative   pH, UA 5.5 5.0 - 8.0   POC PROTEIN,UA negative negative, trace   Urobilinogen, UA 0.2 0.2 or 1.0 E.U./dL   Nitrite, UA Negative Negative   Leukocytes, UA Negative Negative        Assessment & Plan:  [name] KitchenMarland KitchenDiagnoses and all orders for this visit:  Elevated LDL cholesterol level -     Lipid panel -     POCT URINALYSIS DIP (CLINITEK) -     Lipid panel  Ketonuria -     POCT URINALYSIS DIP (CLINITEK)  History of diverticulitis -     POCT URINALYSIS DIP (CLINITEK)  Osteoporosis screening -     DG Bone Density; Future -     POCT URINALYSIS DIP (CLINITEK)  Underweight   Diverticulitis symptoms resolved, finish augmentin No  ketones found in urine, great news Bone density ordered today Lipid level ordered to follow up on elevated cholesterol Referral placed for endocrinology for being underweight and for consult.    Tandy Gaw, PA-C

## 2023-03-14 ENCOUNTER — Ambulatory Visit: Payer: Medicare HMO

## 2023-03-14 ENCOUNTER — Telehealth (HOSPITAL_COMMUNITY): Payer: Self-pay

## 2023-03-14 DIAGNOSIS — R9389 Abnormal findings on diagnostic imaging of other specified body structures: Secondary | ICD-10-CM

## 2023-03-14 DIAGNOSIS — N83201 Unspecified ovarian cyst, right side: Secondary | ICD-10-CM | POA: Diagnosis not present

## 2023-03-14 NOTE — Telephone Encounter (Signed)
Spoke with the patient, detailed instructions given. She stated that she would be here for her test. Asked to call back with any questions.  S.Jalyric Kaestner CCT

## 2023-03-15 DIAGNOSIS — E7800 Pure hypercholesterolemia, unspecified: Secondary | ICD-10-CM | POA: Diagnosis not present

## 2023-03-16 LAB — LIPID PANEL
Cholesterol: 245 mg/dL — ABNORMAL HIGH (ref <200)
HDL: 88 mg/dL (ref >=50)
LDL Cholesterol (Calc): 143 mg/dL (calc) — ABNORMAL HIGH
Non-HDL Cholesterol (Calc): 157 mg/dL (calc) — ABNORMAL HIGH (ref <130)
Total CHOL/HDL Ratio: 2.8 (calc) (ref <5.0)
Triglycerides: 51 mg/dL (ref <150)

## 2023-03-18 NOTE — Progress Notes (Signed)
Very little improvement of LDL, bad cholesterol.  Some lowering of total cholesterol but most of change was in good cholesterol, HDL. We like this number to be elevated.   [name] Kitchen.The 10-year ASCVD risk score (Arnett DK, et al., 2019) is: 5.6%   Values used to calculate the score:     Age: 67 years     Sex: Female     Is Non-Hispanic African American: No     Diabetic: No     Tobacco smoker: No     Systolic Blood Pressure: 111 mmHg     Is BP treated: No     HDL Cholesterol: 88 mg/dL     Total Cholesterol: 245 mg/dL

## 2023-03-19 ENCOUNTER — Encounter: Payer: Self-pay | Admitting: Cardiology

## 2023-03-19 ENCOUNTER — Telehealth: Payer: Self-pay | Admitting: Cardiology

## 2023-03-19 ENCOUNTER — Ambulatory Visit (HOSPITAL_COMMUNITY): Payer: Medicare HMO | Attending: Cardiology

## 2023-03-19 ENCOUNTER — Encounter: Payer: Self-pay | Admitting: Obstetrics and Gynecology

## 2023-03-19 DIAGNOSIS — R9431 Abnormal electrocardiogram [ECG] [EKG]: Secondary | ICD-10-CM | POA: Diagnosis not present

## 2023-03-19 DIAGNOSIS — E7800 Pure hypercholesterolemia, unspecified: Secondary | ICD-10-CM | POA: Diagnosis not present

## 2023-03-19 DIAGNOSIS — R072 Precordial pain: Secondary | ICD-10-CM | POA: Diagnosis not present

## 2023-03-19 LAB — MYOCARDIAL PERFUSION IMAGING
Angina Index: 0
Duke Treadmill Score: 7
Estimated workload: 8.9
Exercise duration (min): 7 min
Exercise duration (sec): 15 s
LV dias vol: 45 mL (ref 46–106)
LV sys vol: 9 mL
MPHR: 152 {beats}/min
Nuc Stress EF: 79 %
Peak HR: 144 {beats}/min
Percent HR: 94 %
Rest HR: 68 {beats}/min
Rest Nuclear Isotope Dose: 10.3 mCi
SDS: 3
SRS: 0
SSS: 3
ST Depression (mm): 0 mm
Stress Nuclear Isotope Dose: 29.7 mCi
TID: 0.78

## 2023-03-19 MED ORDER — TECHNETIUM TC 99M TETROFOSMIN IV KIT
10.3000 | PACK | Freq: Once | INTRAVENOUS | Status: AC | PRN
  Administered 2023-03-19: 10.3 via INTRAVENOUS

## 2023-03-19 MED ORDER — TECHNETIUM TC 99M TETROFOSMIN IV KIT
29.7000 | PACK | Freq: Once | INTRAVENOUS | Status: AC | PRN
  Administered 2023-03-19: 29.7 via INTRAVENOUS

## 2023-03-19 NOTE — Telephone Encounter (Signed)
Called patient. Patient made aware of the lab results for stress test. Verbalized understanding. No questions or concerns expressed at this time.

## 2023-03-19 NOTE — Telephone Encounter (Signed)
Patient has some questions regarding her recent test.

## 2023-03-26 ENCOUNTER — Other Ambulatory Visit (HOSPITAL_COMMUNITY): Payer: Self-pay | Admitting: Psychiatry

## 2023-03-26 ENCOUNTER — Other Ambulatory Visit: Payer: Self-pay | Admitting: Family Medicine

## 2023-03-26 DIAGNOSIS — M5412 Radiculopathy, cervical region: Secondary | ICD-10-CM

## 2023-03-26 DIAGNOSIS — R4589 Other symptoms and signs involving emotional state: Secondary | ICD-10-CM

## 2023-03-26 DIAGNOSIS — F419 Anxiety disorder, unspecified: Secondary | ICD-10-CM

## 2023-03-26 DIAGNOSIS — M5030 Other cervical disc degeneration, unspecified cervical region: Secondary | ICD-10-CM

## 2023-03-26 NOTE — Telephone Encounter (Signed)
Called patient to review results - we are waiting for radiology read of her [place]. On review of her images, her EL remains thin and the amount of fluid in her endometrium is stable. The hyperechoic lesion also appears stable in size. Discussed that if [place] remains stable on final read, have the option for (1) continued surveillance, (2) endometrial biopsy in office with pre procedural misoprostol (hx cervical stenosis), (3) hysteroscopy D&C in OR with pre procedural misoprostol. Pt is considering her options.   Will follow up virtually to discuss in more detail.   Harvie Bridge, MD Obstetrician & Gynecologist, [facility] for [employer], [facility]

## 2023-03-27 ENCOUNTER — Other Ambulatory Visit: Payer: Self-pay | Admitting: Physician Assistant

## 2023-03-27 ENCOUNTER — Ambulatory Visit: Payer: Medicare HMO

## 2023-03-27 DIAGNOSIS — Z780 Asymptomatic menopausal state: Secondary | ICD-10-CM | POA: Diagnosis not present

## 2023-03-27 DIAGNOSIS — Z1231 Encounter for screening mammogram for malignant neoplasm of breast: Secondary | ICD-10-CM

## 2023-03-27 DIAGNOSIS — Z13820 Encounter for screening for osteoporosis: Secondary | ICD-10-CM

## 2023-03-27 DIAGNOSIS — M810 Age-related osteoporosis without current pathological fracture: Secondary | ICD-10-CM | POA: Diagnosis not present

## 2023-03-27 NOTE — Progress Notes (Signed)
You are very osteoporotic I would advise you to start medication for osteoporosis or consider being referred to osteoporosis clinic.

## 2023-03-28 ENCOUNTER — Telehealth: Payer: Self-pay

## 2023-03-28 ENCOUNTER — Encounter: Payer: Self-pay | Admitting: Physician Assistant

## 2023-03-28 DIAGNOSIS — R1030 Lower abdominal pain, unspecified: Secondary | ICD-10-CM

## 2023-03-28 DIAGNOSIS — Z8719 Personal history of other diseases of the digestive system: Secondary | ICD-10-CM

## 2023-03-28 MED ORDER — AMOXICILLIN-POT CLAVULANATE 875-125 MG PO TABS: 1.0000 | ORAL_TABLET | Freq: Two times a day (BID) | ORAL | 0 refills | Status: DC

## 2023-03-28 NOTE — Telephone Encounter (Signed)
Transition Care Management Follow-up Telephone Call Date of discharge and from where: 03/02/2023 [facility] How have you been since you were released from the hospital? Patient stated she still is not feeling well, still having abdominal pain and is unable to eat. Any questions or concerns? No  Items Reviewed: Did the pt receive and understand the discharge instructions provided? Yes  Medications obtained and verified? Yes  Other? No  Any new allergies since your discharge? No  Dietary orders reviewed? Yes Do you have support at home? Yes   Follow up appointments reviewed:  PCP Hospital f/u appt confirmed? Yes  Scheduled to see Jade L. Caleen Essex, PA-C on 03/13/2023 @ [facility] at [facility]. Specialist Hospital f/u appt confirmed? No  Scheduled to see  on  @ . Are transportation arrangements needed? No  If their condition worsens, is the pt aware to call PCP or go to the Emergency Dept.? Yes Was the patient provided with contact information for the PCP's office or ED? Yes Was to pt encouraged to call back with questions or concerns? Yes  Tyneshia Stivers [facility]  [facility] Guide Direct Dial: [phone]  Website: [email or web address]

## 2023-04-01 ENCOUNTER — Telehealth (INDEPENDENT_AMBULATORY_CARE_PROVIDER_SITE_OTHER): Payer: Medicare HMO | Admitting: Psychiatry

## 2023-04-01 ENCOUNTER — Encounter (HOSPITAL_COMMUNITY): Payer: Self-pay | Admitting: Psychiatry

## 2023-04-01 DIAGNOSIS — F319 Bipolar disorder, unspecified: Secondary | ICD-10-CM | POA: Diagnosis not present

## 2023-04-01 DIAGNOSIS — F419 Anxiety disorder, unspecified: Secondary | ICD-10-CM

## 2023-04-01 DIAGNOSIS — F5102 Adjustment insomnia: Secondary | ICD-10-CM | POA: Diagnosis not present

## 2023-04-01 MED ORDER — CITALOPRAM HYDROBROMIDE 10 MG PO TABS: ORAL_TABLET | ORAL | 2 refills | Status: DC

## 2023-04-01 NOTE — Progress Notes (Signed)
Patient ID: Kellie [name], female   DOB: 1956/02/20, 67 y.o.   MRN: [id number]   [facility] Follow up Outpatient visit  Kellie [name] [id number] 67 y.o.  04/01/2023 9:08 AM   Virtual Visit via Video Note  I connected with Kellie [name] on 04/01/23 at  9:00 AM EDT by a video enabled telemedicine application and verified that I am speaking with the correct person using two identifiers.  Location: Patient: home Provider: home office   I discussed the limitations of evaluation and management by telemedicine and the availability of in person appointments. The patient expressed understanding and agreed to proceed.     I discussed the assessment and treatment plan with the patient. The patient was provided an opportunity to ask questions and all were answered. The patient agreed with the plan and demonstrated an understanding of the instructions.   The patient was advised to call back or seek an in-person evaluation if the symptoms worsen or if the condition fails to improve as anticipated.  I provided 18 minutes of non-face-to-face time during this encounter.       Chief Complaint:    History of Present Illness:   Doing fair, has osteoporosis and now knows that was causing weight loss  Tries to keep positive and goes to church Has a dog as ESA  Gets stressed driving     Aggravating factor: driving, regular stressors, crowds Modifying factor: family, church Duration  8 years plus Severity manageable,  No chest pain  Past Psychiatric History/Hospitalization(s) denies  Hospitalization for psychiatric illness: No History of Electroconvulsive Shock Therapy: No Prior Suicide Attempts: No  Medical History; Past Medical History:  Diagnosis Date   Abnormal Pap smear of cervix    Allergic rhinitis 11/08/2010   Anxiety    Asthma    Bronchitis    GERD (gastroesophageal reflux disease)    Herpes simplex of female genitalia    Hiatal hernia     Hyperlipidemia    Mitral valve prolapse    Osteoporosis 11/21/2011   Thyroid nodule    Urticaria     Allergies: Allergies  Allergen Reactions   Codeine Nausea And Vomiting   Hydrocodone Nausea And Vomiting   Morphine Nausea And Vomiting   Abilify [Aripiprazole]     Nausea/vomiting/weakness/shaky   Morphine And Codeine Nausea And Vomiting   Percocet [Oxycodone-Acetaminophen] Nausea And Vomiting and Other (See Comments)    Patient states it makes blood pressure drop too much   Vicodin [Hydrocodone-Acetaminophen] Nausea And Vomiting    Medications: Outpatient Encounter Medications as of 04/01/2023  Medication Sig   acyclovir (ZOVIRAX) 200 MG capsule TAKE 2 CAPSULES BY MOUTH TWICE DAILY.   albuterol (VENTOLIN HFA) 108 (90 Base) MCG/ACT inhaler Inhale 2 puffs into the lungs every 4 (four) hours as needed for wheezing or shortness of breath.   alendronate (FOSAMAX) 70 MG tablet Take by mouth.   ALPRAZolam (XANAX) 0.5 MG tablet TAKE 1 TABLET BY MOUTH DAILY AS NEEDED FOR ANXIETY.   AMBULATORY NON FORMULARY MEDICATION Incentive spirometer use as directed - fax to [facility] [phone]   amoxicillin-clavulanate (AUGMENTIN) 875-125 MG tablet Take 1 tablet by mouth every 12 (twelve) hours.   ascorbic acid (VITAMIN C) 100 MG tablet Take 1 tablet by mouth daily.   cetirizine (ZYRTEC) 10 MG tablet Take by mouth.   Cholecalciferol (VITAMIN D) 2000 units CAPS Take by mouth.   citalopram (CELEXA) 10 MG tablet TAKE (1) TABLET BY MOUTH ONCE DAILY.  famotidine (PEPCID) 20 MG tablet Take 1 tablet (20 mg total) by mouth at bedtime.   fexofenadine (ALLEGRA) 180 MG tablet Take 1 tablet (180 mg total) by mouth daily.   gabapentin (NEURONTIN) 100 MG capsule TAKE 1 CAPSULE BY MOUTH THREE TIMES A DAY.   hydrocortisone (ANUSOL-HC) 2.5 % rectal cream Place 1 Application rectally 2 (two) times daily as needed for hemorrhoids or anal itching.   Menaquinone-7 (VITAMIN K2) 100 MCG CAPS Take by  mouth.   metoprolol tartrate (LOPRESSOR) 25 MG tablet TAKE 2 HOURS PRIOR TO CT SCAN   pantoprazole (PROTONIX) 40 MG tablet TAKE (1) TABLET BY MOUTH TWICE DAILY.   Probiotic Product (PROBIOTIC & ACIDOPHILUS EX ST PO) Take by mouth.    [DISCONTINUED] citalopram (CELEXA) 10 MG tablet TAKE (1) TABLET BY MOUTH ONCE DAILY.   No facility-administered encounter medications on file as of 04/01/2023.     Family History; Family History  Problem Relation Age of Onset   Kidney cancer Mother 36   COPD Mother    Kidney disease Mother    Allergic rhinitis Mother    Asthma Mother    Heart disease Father    Allergic rhinitis Father    COPD Sister    Depression Sister    Pancreatic cancer Sister    Hypothyroidism Sister    Hypothyroidism Sister    Heart disease Brother    Heart attack Brother    Depression Maternal Aunt    Heart disease Paternal Uncle    Depression Maternal Grandfather    Diabetes Other        neice   Thyroid disease Other        neice   Colon cancer Neg Hx    Rectal cancer Neg Hx    Stomach cancer Neg Hx    Colon polyps Neg Hx    Esophageal cancer Neg Hx        Labs:  Recent Results (from the past 2160 hour(s))  Lipase, blood     Status: None   Collection Time: 03/02/23  8:21 AM  Result Value Ref Range   Lipase 35 11 - 51 U/L    Comment: Performed at [facility], [address], [place], [place] [address]  Comprehensive metabolic panel     Status: None   Collection Time: 03/02/23  8:21 AM  Result Value Ref Range   Sodium 136 135 - 145 mmol/L   Potassium 4.1 3.5 - 5.1 mmol/L   Chloride 101 98 - 111 mmol/L   CO2 27 22 - 32 mmol/L   Glucose, Bld 91 70 - 99 mg/dL    Comment: Glucose reference range applies only to samples taken after fasting for at least 8 hours.   BUN 15 8 - 23 mg/dL   Creatinine, Ser 4.33 0.44 - 1.00 mg/dL   Calcium 8.9 8.9 - 29.5 mg/dL   Total Protein 6.5 6.5 - 8.1 g/dL   Albumin 4.0 3.5 - 5.0 g/dL   AST 20 15 - 41 U/L   ALT 15 0 - 44  U/L   Alkaline Phosphatase 43 38 - 126 U/L   Total Bilirubin 0.7 0.3 - 1.2 mg/dL   GFR, Estimated >18 >84 mL/min    Comment: (NOTE) Calculated using the CKD-EPI Creatinine Equation (2021)    Anion gap 8 5 - 15    Comment: Performed at [facility], [address] [place], [place] [address]  CBC     Status: None   Collection Time: 03/02/23  8:21  AM  Result Value Ref Range   WBC 7.6 4.0 - 10.5 K/uL   RBC 3.89 3.87 - 5.11 MIL/uL   Hemoglobin 12.3 12.0 - 15.0 g/dL   HCT 40.9 81.1 - 91.4 %   MCV 93.8 80.0 - 100.0 fL   MCH 31.6 26.0 - 34.0 pg   MCHC 33.7 30.0 - 36.0 g/dL   RDW 78.2 95.6 - 21.3 %   Platelets 263 150 - 400 K/uL   nRBC 0.0 0.0 - 0.2 %    Comment: Performed at [facility], [address], [place], [place] [address]  Urinalysis, Routine w reflex microscopic -Urine, Clean Catch     Status: Abnormal   Collection Time: 03/02/23  8:48 AM  Result Value Ref Range   Color, Urine YELLOW YELLOW   APPearance CLEAR CLEAR   Specific Gravity, Urine 1.017 1.005 - 1.030   pH 6.0 5.0 - 8.0   Glucose, UA NEGATIVE NEGATIVE mg/dL   Hgb urine dipstick NEGATIVE NEGATIVE   Bilirubin Urine NEGATIVE NEGATIVE   Ketones, ur 5 (A) NEGATIVE mg/dL   Protein, ur NEGATIVE NEGATIVE mg/dL   Nitrite NEGATIVE NEGATIVE   Leukocytes,Ua NEGATIVE NEGATIVE    Comment: Performed at [facility], [address], [place], [place] [address]  SARS Coronavirus 2 by RT PCR (hospital order, performed in [facility] hospital lab) *cepheid single result test* Anterior Nasal Swab     Status: None   Collection Time: 03/02/23 11:39 AM   Specimen: Anterior Nasal Swab  Result Value Ref Range   SARS Coronavirus 2 by RT PCR NEGATIVE NEGATIVE    Comment: (NOTE) SARS-CoV-2 target nucleic acids are NOT DETECTED.  The SARS-CoV-2 RNA is generally detectable in upper and lower respiratory specimens during the acute phase of infection. The lowest concentration of SARS-CoV-2 viral copies this assay can detect is 250 copies /  mL. A negative result does not preclude SARS-CoV-2 infection and should not be used as the sole basis for treatment or other patient management decisions.  A negative result may occur with improper specimen collection / handling, submission of specimen other than nasopharyngeal swab, presence of viral mutation(s) within the areas targeted by this assay, and inadequate number of viral copies (<250 copies / mL). A negative result must be combined with clinical observations, patient history, and epidemiological information.  Fact Sheet for Patients:   RoadLapTop.co.za  Fact Sheet for Healthcare Providers: http://kim-miller.com/  This test is not yet approved or  cleared by the [place] FDA and has been authorized for detection and/or diagnosis of SARS-CoV-2 by FDA under an Emergency Use Authorization (EUA).  This EUA will remain in effect (meaning this test can be used) for the duration of the COVID-19 declaration under Section 564(b)(1) of the Act, 21 U.S.C. section 360bbb-3(b)(1), unless the authorization is terminated or revoked sooner.  Performed at [facility], [address], [place], [place] [address]   ECHOCARDIOGRAM COMPLETE     Status: None   Collection Time: 03/04/23 10:41 AM  Result Value Ref Range   Area-P 1/2 4.29 cm2   Ao pk vel 1.29 m/s   AR max vel 2.73 cm2   AV Peak grad 6.7 mmHg   AV Area VTI 2.75 cm2   AV Area mean vel 2.63 cm2   P 1/2 time 1,429 msec   AV Mean grad 4.0 mmHg   Single Plane A2C EF 72.1 %   Single Plane A4C EF 56.6 %   Calc EF 63.4 %   AV Vena  cont 0.30 cm   Est EF 60 - 65%   POCT URINALYSIS DIP (CLINITEK)     Status: Normal   Collection Time: 03/13/23  1:25 PM  Result Value Ref Range   Color, UA yellow yellow   Clarity, UA clear clear   Glucose, UA negative negative mg/dL   Bilirubin, UA negative negative   Ketones, POC UA negative negative mg/dL   Spec Grav, UA 4.098 1.191 - 1.025    Blood, UA negative negative   pH, UA 5.5 5.0 - 8.0   POC PROTEIN,UA negative negative, trace   Urobilinogen, UA 0.2 0.2 or 1.0 E.U./dL   Nitrite, UA Negative Negative   Leukocytes, UA Negative Negative  Lipid panel     Status: Abnormal   Collection Time: 03/15/23  8:03 AM  Result Value Ref Range   Cholesterol 245 (H) <200 mg/dL   HDL 88 > OR = 50 mg/dL   Triglycerides 51 <478 mg/dL   LDL Cholesterol (Calc) 143 (H) mg/dL (calc)    Comment: Reference range: <100 . Desirable range <100 mg/dL for primary prevention;   <70 mg/dL for patients with CHD or diabetic patients  with > or = 2 CHD risk factors. [name] Kitchen LDL-C is now calculated using the Martin-Hopkins  calculation, which is a validated novel method providing  better accuracy than the Friedewald equation in the  estimation of LDL-C.  Horald Pollen et al. Lenox Ahr. 2956;213(08): 2061-2068  (http://education.[employer].com/faq/FAQ164)    Total CHOL/HDL Ratio 2.8 <5.0 (calc)   Non-HDL Cholesterol (Calc) 157 (H) <130 mg/dL (calc)    Comment: For patients with diabetes plus 1 major ASCVD risk  factor, treating to a non-HDL-C goal of <100 mg/dL  (LDL-C of <65 mg/dL) is considered a therapeutic  option.   MYOCARDIAL PERFUSION IMAGING     Status: None   Collection Time: 03/19/23  1:09 PM  Result Value Ref Range   Rest Nuclear Isotope Dose 10.3 mCi   Stress Nuclear Isotope Dose 29.7 mCi   Rest HR 68.0 bpm   Rest BP 117/68 mmHg   Exercise duration (min) 7 min   Exercise duration (sec) 15 sec   Estimated workload 8.9    Peak HR 144 bpm   Peak BP 142/68 mmHg   MPHR 152 bpm   Percent HR 94.0 %   SSS 3.0    SRS 0.0    SDS 3.0    TID 0.78    LV sys vol 9.0 mL   LV dias vol 45.0 46 - 106 mL   Nuc Stress EF 79 %   Angina Index 0    Duke Treadmill Score 7    ST Depression (mm) 0 mm       Mental Status Examination;   Psychiatric Specialty Exam: Physical Exam  Review of Systems  Cardiovascular:  Negative for chest pain.   Psychiatric/Behavioral:  Negative for depression and suicidal ideas.     There were no vitals taken for this visit.There is no height or weight on file to calculate BMI.  General Appearance: casual  Eye Contact::  fair  Speech:  coherent  Volume:  Normal  Mood: fair  Affect:   Thought Process: clear . No psychosis  Orientation:  Full (Time, Place, and Person)  Thought Content:  Rumination  Suicidal Thoughts:  No  Homicidal Thoughts:  No  Memory:  Immediate;   Fair Recent;   Fair  Judgement:  Fair  Insight:  Shallow  Psychomotor Activity:  Increased  Concentration:  Fair  Recall:  Fair  Akathisia:  Negative  Handed:  Right  AIMS (if indicated):     Assets:  Desire for Improvement Physical Health Transportation  Sleep:        Assessment: Axis I: bipolar disorder II unspecified or depressed type. Anxiety disorder NOS. Insomnia  Axis II: deferred  Axis III:  Past Medical History:  Diagnosis Date   Abnormal Pap smear of cervix    Allergic rhinitis 11/08/2010   Anxiety    Asthma    Bronchitis    GERD (gastroesophageal reflux disease)    Herpes simplex of female genitalia    Hiatal hernia    Hyperlipidemia    Mitral valve prolapse    Osteoporosis 11/21/2011   Thyroid nodule    Urticaria     Axis IV: psychosocial   Treatment Plan and Summary:  Prior documentation reviewed   Bipolar : baseline conitnue gaba   GAD:  manageable,gets stress of the circumstances, continue coping skills and celexa, also on xanax from pcp   Insomnia: manageable continue sleep hygiene and xanax prn  Refills due were sent  Thresa Ross, MD 04/01/2023

## 2023-04-18 ENCOUNTER — Ambulatory Visit: Payer: Medicare HMO | Admitting: Obstetrics and Gynecology

## 2023-04-18 ENCOUNTER — Encounter: Payer: Self-pay | Admitting: Obstetrics and Gynecology

## 2023-04-18 VITALS — BP 102/62 | HR 76 | Ht 66.0 in | Wt 104.0 lb

## 2023-04-18 DIAGNOSIS — N859 Noninflammatory disorder of uterus, unspecified: Secondary | ICD-10-CM | POA: Diagnosis not present

## 2023-04-18 DIAGNOSIS — N882 Stricture and stenosis of cervix uteri: Secondary | ICD-10-CM | POA: Diagnosis not present

## 2023-04-18 NOTE — Progress Notes (Signed)
   RETURN GYNECOLOGY VISIT  Subjective:  Kellie [name] is a 67 y.o. menopausal G1P1001 presenting for follow up of pelvic US.   Pt first seen by myself on 12/12/22. She had been referred by her PCP for endometrial fluid found on [place] - first noted 2020 on US obtained for cervical stenosis but no other symptoms that the patient can recall. A repeat scan 10/2022 showed fluid within the endometrial cavity and interval development on a 3mm endometrial mass. She has a history of LEEP with likely cervical stenosis/scarring.   We discussed options for management including endometrial biopsy, but ultimately decided on surveillance with repeat US in 3-6 months. Repeat US on 03/14/23 shows persistence of a small amount of simple fluid within the endometrium, EL <91mm, and no endometrial lesion/mass. She has a small biloculated cyst measuring 1.5cm similar in appearance to prior imaging. Since our last appointment, she's had another CT A/P on 03/02/23 with normal visualization of reproductive organs and mild diverticulitis.   She denies any vaginal bleeding or pelvic pain (except for when she has episodes of diverticulitis).   Objective:   Vitals:   04/18/23 0947  BP: 102/62  Pulse: 76  Weight: 104 lb (47.2 kg)  Height: 5\' 6"  (1.676 m)   General:  Alert, oriented and cooperative. Patient is in no acute distress.  Skin: Skin is warm and dry. No rash noted.   Cardiovascular: Normal heart rate noted  Respiratory: Normal respiratory effort, no problems with respiration noted   Assessment and Plan:  Kellie [name] is a 67 y.o. with fluid in endometrial cavity and cervical stenosis  We discussed next steps in management. Given the stability of the fluid since 2020, resolution of possible endometrial lesion, EL < 3mm, and overall low risk for endometrial cancer, the likelihood that Kellie [name] currently has uterine cancer is very low (<1-2.5%). In other words, we can be around 97-99% certain that she does not have  cancer.   We discussed limitations of endometrial cancer surveillance in s/o cervical stenosis since vaginal bleeding is our first warning sign and cervical stenosis may prevent [place] from seeing any ongoing uterine bleeding.   We reviewed the option for endometrial sampling with either attempted in office biopsy or hysteroscopy D&C in the OR. We discussed that benign pathology would give [place] closer to 99.6% certainty of diagnosis. We discussed risks/benefits of each option including low risk of uterine perforation (<1%) or inability to obtain adequate sample.   After discussion, Kellie [name] would prefer to monitor her symptoms. We discussed that repeating her imaging is unlikely to be helpful if she remains symptomatic. If she develops pelvic pain unrelated to episodes of diverticulitis or if she were to develop ANY vaginal bleeding, she will call to schedule an appointment and sampling should be pursued. She expressed her understanding and is in agreement with the plan.  Return in about 1 year (around 04/17/2024) for annual exam or sooner as needed.  I spent a total of 34 minutes reviewing the patient's chart, interviewing/counseling the patient, and documenting our encounter.   Future Appointments  Date Time Provider Department Center  05/01/2023 11:00 AM [facility] 1 [facility]  08/06/2023  9:00 AM [facility]-ANNUAL WELLNESS VISIT [facility] None  09/30/2023 10:00 AM Thresa Ross, MD [facility] None    Lennart Pall, MD

## 2023-04-26 ENCOUNTER — Other Ambulatory Visit (HOSPITAL_COMMUNITY): Payer: Self-pay | Admitting: Psychiatry

## 2023-04-26 DIAGNOSIS — M5412 Radiculopathy, cervical region: Secondary | ICD-10-CM

## 2023-04-26 DIAGNOSIS — M5030 Other cervical disc degeneration, unspecified cervical region: Secondary | ICD-10-CM

## 2023-05-01 ENCOUNTER — Ambulatory Visit: Payer: Medicare HMO

## 2023-05-01 DIAGNOSIS — Z1231 Encounter for screening mammogram for malignant neoplasm of breast: Secondary | ICD-10-CM

## 2023-05-06 ENCOUNTER — Other Ambulatory Visit: Payer: Self-pay

## 2023-05-06 ENCOUNTER — Telehealth: Payer: Self-pay | Admitting: Gastroenterology

## 2023-05-06 ENCOUNTER — Emergency Department (HOSPITAL_BASED_OUTPATIENT_CLINIC_OR_DEPARTMENT_OTHER)
Admission: EM | Admit: 2023-05-06 | Discharge: 2023-05-06 | Disposition: A | Discharge status: Home / Self Care | Payer: Medicare HMO | Attending: Emergency Medicine | Admitting: Emergency Medicine

## 2023-05-06 ENCOUNTER — Emergency Department (HOSPITAL_BASED_OUTPATIENT_CLINIC_OR_DEPARTMENT_OTHER): Payer: Medicare HMO

## 2023-05-06 ENCOUNTER — Encounter: Payer: Self-pay | Admitting: Physician Assistant

## 2023-05-06 ENCOUNTER — Telehealth: Payer: Self-pay | Admitting: Physician Assistant

## 2023-05-06 ENCOUNTER — Encounter (HOSPITAL_BASED_OUTPATIENT_CLINIC_OR_DEPARTMENT_OTHER): Payer: Self-pay | Admitting: Emergency Medicine

## 2023-05-06 DIAGNOSIS — K5732 Diverticulitis of large intestine without perforation or abscess without bleeding: Secondary | ICD-10-CM | POA: Diagnosis not present

## 2023-05-06 DIAGNOSIS — R109 Unspecified abdominal pain: Secondary | ICD-10-CM | POA: Diagnosis not present

## 2023-05-06 DIAGNOSIS — K5730 Diverticulosis of large intestine without perforation or abscess without bleeding: Secondary | ICD-10-CM | POA: Diagnosis not present

## 2023-05-06 DIAGNOSIS — R1030 Lower abdominal pain, unspecified: Secondary | ICD-10-CM | POA: Diagnosis not present

## 2023-05-06 DIAGNOSIS — K5792 Diverticulitis of intestine, part unspecified, without perforation or abscess without bleeding: Secondary | ICD-10-CM

## 2023-05-06 LAB — COMPREHENSIVE METABOLIC PANEL
ALT: 17 U/L (ref 0–44)
AST: 22 U/L (ref 15–41)
Albumin: 4.4 g/dL (ref 3.5–5.0)
Alkaline Phosphatase: 38 U/L (ref 38–126)
Anion gap: 7 (ref 5–15)
BUN: 15 mg/dL (ref 8–23)
CO2: 28 mmol/L (ref 22–32)
Calcium: 9.5 mg/dL (ref 8.9–10.3)
Chloride: 103 mmol/L (ref 98–111)
Creatinine, Ser: 0.8 mg/dL (ref 0.44–1.00)
GFR, Estimated: >60 mL/min (ref >60)
Glucose, Bld: 123 mg/dL — ABNORMAL HIGH (ref 70–99)
Potassium: 3.8 mmol/L (ref 3.5–5.1)
Sodium: 138 mmol/L (ref 135–145)
Total Bilirubin: 0.8 mg/dL (ref <1.2)
Total Protein: 7 g/dL (ref 6.5–8.1)

## 2023-05-06 LAB — CBC
HCT: 35.2 % — ABNORMAL LOW (ref 36.0–46.0)
Hemoglobin: 11.9 g/dL — ABNORMAL LOW (ref 12.0–15.0)
MCH: 30.8 pg (ref 26.0–34.0)
MCHC: 33.8 g/dL (ref 30.0–36.0)
MCV: 91.2 fL (ref 80.0–100.0)
Platelets: 282 10*3/uL (ref 150–400)
RBC: 3.86 MIL/uL — ABNORMAL LOW (ref 3.87–5.11)
RDW: 12.2 % (ref 11.5–15.5)
WBC: 10.4 10*3/uL (ref 4.0–10.5)
nRBC: 0 % (ref 0.0–0.2)

## 2023-05-06 LAB — URINALYSIS, ROUTINE W REFLEX MICROSCOPIC
Bilirubin Urine: NEGATIVE
Glucose, UA: NEGATIVE mg/dL
Hgb urine dipstick: NEGATIVE
Ketones, ur: NEGATIVE mg/dL
Leukocytes,Ua: NEGATIVE
Nitrite: NEGATIVE
Protein, ur: NEGATIVE mg/dL
Specific Gravity, Urine: <=1.005 (ref 1.005–1.030)
pH: 6.5 (ref 5.0–8.0)

## 2023-05-06 LAB — LIPASE, BLOOD: Lipase: 36 U/L (ref 11–51)

## 2023-05-06 MED ORDER — ONDANSETRON 4 MG PO TBDP
ORAL_TABLET | ORAL | 0 refills | Status: DC
Start: 2023-05-06 — End: 2023-05-20

## 2023-05-06 MED ORDER — METRONIDAZOLE 500 MG PO TABS: 500.0000 mg | ORAL_TABLET | Freq: Three times a day (TID) | ORAL | 0 refills | Status: DC

## 2023-05-06 MED ORDER — CIPROFLOXACIN IN D5W 400 MG/200ML IV SOLN
400.0000 mg | Freq: Once | INTRAVENOUS | Status: AC
  Administered 2023-05-06: 400 mg via INTRAVENOUS
  Filled 2023-05-06: qty 200

## 2023-05-06 MED ORDER — IOHEXOL 300 MG/ML  SOLN
100.0000 mL | Freq: Once | INTRAMUSCULAR | Status: AC | PRN
  Administered 2023-05-06: 100 mL via INTRAVENOUS

## 2023-05-06 MED ORDER — CIPROFLOXACIN HCL 500 MG PO TABS: 500.0000 mg | ORAL_TABLET | Freq: Two times a day (BID) | ORAL | 0 refills | Status: DC

## 2023-05-06 MED ORDER — HYDROCODONE-ACETAMINOPHEN 5-325 MG PO TABS: 1.0000 | ORAL_TABLET | ORAL | 0 refills | Status: DC | PRN

## 2023-05-06 MED ORDER — METRONIDAZOLE 500 MG/100ML IV SOLN
500.0000 mg | Freq: Once | INTRAVENOUS | Status: AC
  Administered 2023-05-06: 500 mg via INTRAVENOUS
  Filled 2023-05-06: qty 100

## 2023-05-06 NOTE — Telephone Encounter (Signed)
Hx of diverticulitis with diverticulitis symptoms Did not tolerate augmentin Sent metronidazole and cipro

## 2023-05-06 NOTE — Discharge Instructions (Signed)
You have uncomplicated diverticulitis.  I recommend you take Cipro and Flagyl as prescribed by your doctor.  I have prescribed Norco for pain and you can take Zofran to help with the nausea  You need to follow-up with your GI doctor to get a colonoscopy  Return to ER if you have severe abdominal pain or vomiting or fever

## 2023-05-06 NOTE — Addendum Note (Signed)
Addended by: Jomarie Longs on: 05/06/2023 03:22 PM   Modules accepted: Orders

## 2023-05-06 NOTE — Telephone Encounter (Signed)
Inbound call from patient stating she has had 3 diverticulitis flare ups. Patient is due for procedure but was scheduled for next available ov with provider.   Requesting to speak with a nurse.   Previous patient of Dr Christella Hartigan.

## 2023-05-06 NOTE — Telephone Encounter (Signed)
Sent to [facility]. If not feeling better will need in office visit.

## 2023-05-06 NOTE — Progress Notes (Signed)
Normal mammogram. Follow up in 1 year.

## 2023-05-06 NOTE — Telephone Encounter (Signed)
Patient called she is requesting Cipro and Flagyl she can't take amox 875mg  she can't swallow please send to [facility] [place]  Phone # [phone]

## 2023-05-06 NOTE — Telephone Encounter (Signed)
The pt states that she was treated for diverticulitis flare on 10/3.  She called and made an appt with GM for next available in Feb.  She states she has some nausea and change in bowel habits now.  She is not sure if this is diverticulits but called her PCP and our office for instructions.  She did mention that the pain has her in tears at times.  Our next available is 11/19 with Hyacinth Meeker and she feels that is not soon enough.  She will wait for a call from her PCP and if they can not see her in the next 1- 2 days she will go to UC.  She will keep the appt with GM for follow up and call our office back if needed in the meantime.

## 2023-05-06 NOTE — ED Triage Notes (Signed)
Recurrent diverticulitis , third flare up since 09/24. Her PCP sent antibiotic today , preferred to be checked before treatment ,  Abdominal pain and emesis started today , feeling lethargic .  Lightheaded .

## 2023-05-06 NOTE — ED Provider Notes (Signed)
[place] [facility]PARTMENT AT [facility] Provider Note   CSN: [id number] Arrival date & time: 05/06/23  1700     History  Chief Complaint  Patient presents with   Abdominal Pain    Kellie [name] is a 67 y.o. female history of recurrent diverticulitis, here presenting with abdominal pain.  Patient states that she had diverticulitis 3 times since September of this year.  She did not tolerate Augmentin so she has been on Cipro and Flagyl recently.  She states that she had worsening lower abdominal pain since this morning.  She was prescribed Cipro Flagyl and fill the prescription but came here for further assessment.  Her most recent CT was back in September and it showed uncomplicated diverticulitis.  Patient had some nausea but no vomiting.  The history is provided by the patient.       Home Medications Prior to Admission medications   Medication Sig Start Date End Date Taking? Authorizing Provider  acyclovir (ZOVIRAX) 200 MG capsule TAKE 2 CAPSULES BY MOUTH TWICE DAILY. 07/30/22   Breeback, Lonna Cobb, PA-C  albuterol (VENTOLIN HFA) 108 (90 Base) MCG/ACT inhaler Inhale 2 puffs into the lungs every 4 (four) hours as needed for wheezing or shortness of breath. 01/14/23   Breeback, Jade L, PA-C  alendronate (FOSAMAX) 70 MG tablet Take by mouth. 09/02/18   [provider]  ALPRAZolam (XANAX) 0.5 MG tablet TAKE 1 TABLET BY MOUTH DAILY AS NEEDED FOR ANXIETY. 03/27/23   Jomarie Longs, PA-C  AMBULATORY NON FORMULARY MEDICATION Incentive spirometer use as directed - fax to [facility] [phone] 01/31/21   Sunnie Nielsen, DO  amoxicillin-clavulanate (AUGMENTIN) 875-125 MG tablet Take 1 tablet by mouth every 12 (twelve) hours. 03/28/23   Breeback, Lonna Cobb, PA-C  ascorbic acid (VITAMIN C) 100 MG tablet Take 1 tablet by mouth daily.    [provider]  cetirizine (ZYRTEC) 10 MG tablet Take by mouth.    [provider]  Cholecalciferol  (VITAMIN D) 2000 units CAPS Take by mouth.    [provider]  ciprofloxacin (CIPRO) 500 MG tablet Take 1 tablet (500 mg total) by mouth 2 (two) times daily. 05/06/23   Breeback, Jade L, PA-C  citalopram (CELEXA) 10 MG tablet TAKE (1) TABLET BY MOUTH ONCE DAILY. 04/01/23   Thresa Ross, MD  famotidine (PEPCID) 20 MG tablet Take 1 tablet (20 mg total) by mouth at bedtime. 11/06/22   Breeback, Lonna Cobb, PA-C  fexofenadine (ALLEGRA) 180 MG tablet Take 1 tablet (180 mg total) by mouth daily. 11/06/22   Breeback, Jade L, PA-C  gabapentin (NEURONTIN) 100 MG capsule TAKE 1 CAPSULE BY MOUTH THREE TIMES A DAY. 04/26/23   Thresa Ross, MD  hydrocortisone (ANUSOL-HC) 2.5 % rectal cream Place 1 Application rectally 2 (two) times daily as needed for hemorrhoids or anal itching. 11/21/22   Agapito Games, MD  Menaquinone-7 (VITAMIN K2) 100 MCG CAPS Take by mouth. 12/18/21   [provider]  metoprolol tartrate (LOPRESSOR) 25 MG tablet TAKE 2 HOURS PRIOR TO CT SCAN 02/04/23   Lewayne Bunting, MD  metroNIDAZOLE (FLAGYL) 500 MG tablet Take 1 tablet (500 mg total) by mouth 3 (three) times daily. 05/06/23   Breeback, Jade L, PA-C  pantoprazole (PROTONIX) 40 MG tablet TAKE (1) TABLET BY MOUTH TWICE DAILY. 02/28/23   Breeback, Jade L, PA-C  Probiotic Product (PROBIOTIC & ACIDOPHILUS EX ST PO) Take by mouth.     [provider]  Allergies    Codeine, Hydrocodone, Morphine, Abilify [aripiprazole], Morphine and codeine, Percocet [oxycodone-acetaminophen], and Vicodin [hydrocodone-acetaminophen]    Review of Systems   Review of Systems  Gastrointestinal:  Positive for abdominal pain.  All other systems reviewed and are negative.   Physical Exam Updated Vital Signs BP (!) 106/53   Pulse 76   Temp 98.5 F (36.9 C)   Resp 18   SpO2 100%  Physical Exam Vitals and nursing note reviewed.  Constitutional:      Comments: Slightly anxious but comfortable  HENT:     Head:  Normocephalic.  Eyes:     Extraocular Movements: Extraocular movements intact.     Pupils: Pupils are equal, round, and reactive to light.  Cardiovascular:     Rate and Rhythm: Normal rate and regular rhythm.  Pulmonary:     Effort: Pulmonary effort is normal.     Breath sounds: Normal breath sounds.  Abdominal:     General: Abdomen is flat.     Comments: Mild diffuse lower abdominal tenderness.  Skin:    General: Skin is warm.     Capillary Refill: Capillary refill takes less than 2 seconds.  Neurological:     General: No focal deficit present.     Mental Status: She is alert and oriented to person, place, and time.  Psychiatric:        Mood and Affect: Mood normal.        Behavior: Behavior normal.     ED Results / Procedures / Treatments   Labs (all labs ordered are listed, but only abnormal results are displayed) Labs Reviewed  COMPREHENSIVE METABOLIC PANEL - Abnormal; Notable for the following components:      Result Value   Glucose, Bld 123 (*)    All other components within normal limits  CBC - Abnormal; Notable for the following components:   RBC 3.86 (*)    Hemoglobin 11.9 (*)    HCT 35.2 (*)    All other components within normal limits  LIPASE, BLOOD  URINALYSIS, ROUTINE W REFLEX MICROSCOPIC    EKG None  Radiology CT ABDOMEN PELVIS W CONTRAST  Result Date: 05/06/2023 CLINICAL DATA:  Abdomen pain EXAM: CT ABDOMEN AND PELVIS WITH CONTRAST TECHNIQUE: Multidetector CT imaging of the abdomen and pelvis was performed using the standard protocol following bolus administration of intravenous contrast. RADIATION DOSE REDUCTION: This exam was performed according to the departmental dose-optimization program which includes automated exposure control, adjustment of the mA and/or kV according to patient size and/or use of iterative reconstruction technique. CONTRAST:  OMNIPAQUE IOHEXOL 300 MG/ML  SOLN COMPARISON:  CT 03/02/2023 FINDINGS: Lower chest: No acute  abnormality. Hepatobiliary: Subcentimeter hypodensities in the liver too small to further characterize. No calcified gallstone or biliary dilatation. Pancreas: Unremarkable. No pancreatic ductal dilatation or surrounding inflammatory changes. Spleen: Normal in size without focal abnormality. Adrenals/Urinary Tract: Adrenal glands are unremarkable. Kidneys are normal, without renal calculi, focal lesion, or hydronephrosis. Bladder is unremarkable. Stomach/Bowel: Stomach nonenlarged. No dilated small bowel. Negative appendix. Diverticular disease of the colon. Focal wall thickening and inflammation at the sigmoid colon in the pelvis, series 301, image 51 consistent with acute diverticulitis. No perforation or abscess. Vascular/Lymphatic: Mild atherosclerosis. No aneurysm. No suspicious lymph nodes Reproductive: Uterus and bilateral adnexa are unremarkable. Other: Negative for free air or free fluid. Musculoskeletal: No acute or significant osseous findings. IMPRESSION: Findings consistent with acute sigmoid colon diverticulitis. No perforation or abscess. Electronically Signed   By: Jasmine Pang  M.D.   On: 05/06/2023 22:05    Procedures Procedures    Medications Ordered in ED Medications  iohexol (OMNIPAQUE) 300 MG/ML solution 100 mL (100 mLs Intravenous Contrast Given 05/06/23 1847)  ciprofloxacin (CIPRO) IVPB 400 mg (0 mg Intravenous Stopped 05/06/23 2104)  metroNIDAZOLE (FLAGYL) IVPB 500 mg (500 mg Intravenous New Bag/Given 05/06/23 2106)    ED Course/ Medical Decision Making/ A&P                                 Medical Decision Making ASIALYN ELEFANTE is a 67 y.o. female here with lower abdominal pain.  Patient has a history of recurrent diverticulitis.  Plan to get CBC and CMP and CT abdomen pelvis.  10:13 PM I reviewed patient's labs and CBC and CMP and lipase unremarkable.  CT showed acute diverticulitis with no perforation.  Patient was given Cipro and Flagyl in the ED.  Will prescribe pain  medicine.  Patient was prescribed Cipro Flagyl already by PCP.  Problems Addressed: Diverticulitis: acute illness or injury  Amount and/or Complexity of Data Reviewed Labs: ordered. Decision-making details documented in ED Course. Radiology: ordered and independent interpretation performed. Decision-making details documented in ED Course.  Risk Prescription drug management.    Final Clinical Impression(s) / ED Diagnoses Final diagnoses:  None    Rx / DC Orders ED Discharge Orders     None         Charlynne Pander, MD 05/06/23 2215

## 2023-05-08 ENCOUNTER — Encounter: Payer: Self-pay | Admitting: Physician Assistant

## 2023-05-08 DIAGNOSIS — N644 Mastodynia: Secondary | ICD-10-CM

## 2023-05-08 NOTE — Telephone Encounter (Signed)
Who should I reach out too at breast clinic with what to order? She just had screening mammogram?

## 2023-05-09 ENCOUNTER — Encounter: Payer: Self-pay | Admitting: Physician Assistant

## 2023-05-15 ENCOUNTER — Encounter: Payer: Self-pay | Admitting: Physician Assistant

## 2023-05-20 ENCOUNTER — Encounter: Payer: Self-pay | Admitting: Gastroenterology

## 2023-05-20 ENCOUNTER — Other Ambulatory Visit (INDEPENDENT_AMBULATORY_CARE_PROVIDER_SITE_OTHER): Payer: Medicare HMO

## 2023-05-20 ENCOUNTER — Ambulatory Visit (INDEPENDENT_AMBULATORY_CARE_PROVIDER_SITE_OTHER): Payer: Medicare HMO | Admitting: Gastroenterology

## 2023-05-20 VITALS — BP 98/60 | HR 78 | Ht 66.0 in | Wt 103.4 lb

## 2023-05-20 DIAGNOSIS — R1032 Left lower quadrant pain: Secondary | ICD-10-CM

## 2023-05-20 DIAGNOSIS — Z8719 Personal history of other diseases of the digestive system: Secondary | ICD-10-CM

## 2023-05-20 LAB — CBC WITH DIFFERENTIAL/PLATELET
Basophils Absolute: 0 10*3/uL (ref 0.0–0.1)
Basophils Relative: 0.7 % (ref 0.0–3.0)
Eosinophils Absolute: 0 10*3/uL (ref 0.0–0.7)
Eosinophils Relative: 0.8 % (ref 0.0–5.0)
HCT: 39 % (ref 36.0–46.0)
Hemoglobin: 13 g/dL (ref 12.0–15.0)
Lymphocytes Relative: 21.5 % (ref 12.0–46.0)
Lymphs Abs: 1.2 10*3/uL (ref 0.7–4.0)
MCHC: 33.4 g/dL (ref 30.0–36.0)
MCV: 94.5 fL (ref 78.0–100.0)
Monocytes Absolute: 0.4 10*3/uL (ref 0.1–1.0)
Monocytes Relative: 6.6 % (ref 3.0–12.0)
Neutro Abs: 4 10*3/uL (ref 1.4–7.7)
Neutrophils Relative %: 70.4 % (ref 43.0–77.0)
Platelets: 338 10*3/uL (ref 150.0–400.0)
RBC: 4.13 Mil/uL (ref 3.87–5.11)
RDW: 13.1 % (ref 11.5–15.5)
WBC: 5.7 10*3/uL (ref 4.0–10.5)

## 2023-05-20 LAB — COMPREHENSIVE METABOLIC PANEL
ALT: 9 U/L (ref 0–35)
AST: 16 U/L (ref 0–37)
Albumin: 4.3 g/dL (ref 3.5–5.2)
Alkaline Phosphatase: 32 U/L — ABNORMAL LOW (ref 39–117)
BUN: 20 mg/dL (ref 6–23)
CO2: 30 meq/L (ref 19–32)
Calcium: 9.5 mg/dL (ref 8.4–10.5)
Chloride: 102 meq/L (ref 96–112)
Creatinine, Ser: 0.68 mg/dL (ref 0.40–1.20)
GFR: 89.46 mL/min (ref >60.00)
Glucose, Bld: 94 mg/dL (ref 70–99)
Potassium: 4.3 meq/L (ref 3.5–5.1)
Sodium: 139 meq/L (ref 135–145)
Total Bilirubin: 0.4 mg/dL (ref 0.2–1.2)
Total Protein: 6.6 g/dL (ref 6.0–8.3)

## 2023-05-20 NOTE — Progress Notes (Signed)
Chief Complaint: Diverticulitis Primary GI MD: (Dr. Christella Hartigan) - transferring to Dr. Meridee Score  HPI: 67 year old female with medical history as listed below presents for evaluation of recurrent diverticulitis  Discussed the use of AI scribe software for clinical note transcription with the patient, who gave verbal consent to proceed.  History of Present Illness   The patient, with a history of diverticulitis, presents with recurrent flare-ups since September. She reports three episodes since September 9th, necessitating two visits to the emergency room. The patient was initially treated with Augmentin for eight days.  She then revisited the emergency department 05/06/2023 and was found to have acute uncomplicated diverticulitis and was given a ten-day course of Cipro and Flagyl.   Patient notes since completing her last course of antibiotics recently her pain has improved though it is still present (now it is 4 out of 10).  She also notes some increased urinary frequency which is a new symptom for her.  Denies dysuria.  The patient's CT scans from September 7th and November 11th both showed acute sigmoid diverticulitis without perforation or abscess. She denies any history of pancreatitis and her lipase levels were reported as normal. The patient also reports a weight loss of approximately 15 pounds over the past year, which she finds concerning. She has a family history of pancreatic cancer (sister) and renal cancer (mother).  CT scan was negative for any pancreatic abnormality or pancreatic ductal dilation.  Normal LFTs.  Normal lipase and no previous episodes of pancreatitis.  The patient's last colonoscopy was in 2014. She also mentions having completed a Cologuard test, which returned negative results. She expresses concerns about her diet, but reports adhering to a high fiber diet when not experiencing a flare-up. She has been on a soft diet for approximately 11 days at the time of the  consultation.      PREVIOUS GI WORKUP   Colonoscopy 09/2012 - Medium external hemorrhoids - Diverticulosis in the left colon - Repeat 10 years  EGD 09/2012 done for GERD - Normal EGD  Past Medical History:  Diagnosis Date   Abnormal Pap smear of cervix    Allergic rhinitis 11/08/2010   Anxiety    Asthma    Bronchitis    GERD (gastroesophageal reflux disease)    Herpes simplex of female genitalia    Hiatal hernia    Hyperlipidemia    Mitral valve prolapse    Osteoporosis 11/21/2011   Thyroid nodule    Urticaria     Past Surgical History:  Procedure Laterality Date   CLOSED MANIPULATION SHOULDER  7&01/2005   x2, 30 days apart   CLOSED MANIPULATION SHOULDER  2006   Left   COLONOSCOPY  09/2012   Medium sized external hemorrhoids, few small divertic in left colon, otherwise normal colon (repeat 10 yrs)   ESOPHAGOGASTRODUODENOSCOPY  09/2012   Normal (Dr. Christella Hartigan)   LEEP  11/2004    Current Outpatient Medications  Medication Sig Dispense Refill   acyclovir (ZOVIRAX) 200 MG capsule TAKE 2 CAPSULES BY MOUTH TWICE DAILY. 120 capsule 0   albuterol (VENTOLIN HFA) 108 (90 Base) MCG/ACT inhaler Inhale 2 puffs into the lungs every 4 (four) hours as needed for wheezing or shortness of breath. 8.5 g 0   alendronate (FOSAMAX) 70 MG tablet Take by mouth.     ALPRAZolam (XANAX) 0.5 MG tablet TAKE 1 TABLET BY MOUTH DAILY AS NEEDED FOR ANXIETY. 30 tablet 2   AMBULATORY NON FORMULARY MEDICATION Incentive spirometer use as directed - fax  to [facility] [place] [phone] 1 Units 99   amoxicillin-clavulanate (AUGMENTIN) 875-125 MG tablet Take 1 tablet by mouth every 12 (twelve) hours. 14 tablet 0   ascorbic acid (VITAMIN C) 100 MG tablet Take 1 tablet by mouth daily.     cetirizine (ZYRTEC) 10 MG tablet Take by mouth.     Cholecalciferol (VITAMIN D) 2000 units CAPS Take by mouth.     citalopram (CELEXA) 10 MG tablet TAKE (1) TABLET BY MOUTH ONCE DAILY. 30 tablet 2   famotidine  (PEPCID) 20 MG tablet Take 1 tablet (20 mg total) by mouth at bedtime. 90 tablet 0   fexofenadine (ALLEGRA) 180 MG tablet Take 1 tablet (180 mg total) by mouth daily. 90 tablet 0   gabapentin (NEURONTIN) 100 MG capsule TAKE 1 CAPSULE BY MOUTH THREE TIMES A DAY. 90 capsule 1   hydrocortisone (ANUSOL-HC) 2.5 % rectal cream Place 1 Application rectally 2 (two) times daily as needed for hemorrhoids or anal itching. 30 g 1   Menaquinone-7 (VITAMIN K2) 100 MCG CAPS Take by mouth.     metoprolol tartrate (LOPRESSOR) 25 MG tablet TAKE 2 HOURS PRIOR TO CT SCAN 1 tablet 0   pantoprazole (PROTONIX) 40 MG tablet TAKE (1) TABLET BY MOUTH TWICE DAILY. 60 tablet 0   Probiotic Product (PROBIOTIC & ACIDOPHILUS EX ST PO) Take by mouth.      No current facility-administered medications for this visit.    Allergies as of 05/20/2023 - Review Complete 05/20/2023  Allergen Reaction Noted   Codeine Nausea And Vomiting 10/01/2012   Hydrocodone Nausea And Vomiting 09/27/2010   Morphine Nausea And Vomiting 06/01/2020   Abilify [aripiprazole]  09/25/2016   Morphine and codeine Nausea And Vomiting 11/07/2011   Percocet [oxycodone-acetaminophen] Nausea And Vomiting and Other (See Comments) 11/24/2013   Vicodin [hydrocodone-acetaminophen] Nausea And Vomiting 05/24/2015    Family History  Problem Relation Age of Onset   Kidney cancer Mother 47   COPD Mother    Kidney disease Mother    Allergic rhinitis Mother    Asthma Mother    Heart disease Father    Allergic rhinitis Father    COPD Sister    Depression Sister    Pancreatic cancer Sister    Hypothyroidism Sister    Hypothyroidism Sister    Stroke Sister    Heart disease Brother    Heart attack Brother    Depression Maternal Grandfather    Depression Maternal Aunt    Heart disease Paternal Uncle    Diabetes Other        neice   Thyroid disease Other        neice   Colon cancer Neg Hx    Rectal cancer Neg Hx    Stomach cancer Neg Hx    Colon polyps  Neg Hx    Esophageal cancer Neg Hx     Social History   Socioeconomic History   Marital status: Divorced    Spouse name: Not on file   Number of children: 1   Years of education: 9   Highest education level: 9th grade  Occupational History    Comment: Retired  Tobacco Use   Smoking status: Never   Smokeless tobacco: Never  Vaping Use   Vaping status: Never Used  Substance and Sexual Activity   Alcohol use: No   Drug use: No   Sexual activity: Not Currently    Birth control/protection: Post-menopausal  Other Topics Concern   Not on file  Social History Narrative  Lives with her dog. Her son lives about 28 miles from her; her sister close by. She enjoys shopping and exercising.   Social Determinants of Health   Financial Resource Strain: Low Risk  (08/03/2022)   Overall Financial Resource Strain (CARDIA)    Difficulty of Paying Living Expenses: Not hard at all  Food Insecurity: No Food Insecurity (08/03/2022)   Hunger Vital Sign    Worried About Running Out of Food in the Last Year: Never true    Ran Out of Food in the Last Year: Never true  Transportation Needs: No Transportation Needs (08/03/2022)   PRAPARE - Administrator, Civil Service (Medical): No    Lack of Transportation (Non-Medical): No  Physical Activity: Insufficiently Active (08/03/2022)   Exercise Vital Sign    Days of Exercise per Week: 7 days    Minutes of Exercise per Session: 10 min  Stress: No Stress Concern Present (08/03/2022)   [employer] of Occupational Health - Occupational Stress Questionnaire    Feeling of Stress : Not at all  Social Connections: Moderately Isolated (08/03/2022)   Social Connection and Isolation Panel [NHANES]    Frequency of Communication with Friends and Family: More than three times a week    Frequency of Social Gatherings with Friends and Family: Twice a week    Attends Religious Services: More than 4 times per year    Active Member of [employer] or Organizations:  No    Attends Banker Meetings: Never    Marital Status: Divorced  Catering manager Violence: Not At Risk (08/03/2022)   Humiliation, Afraid, Rape, and Kick questionnaire    Fear of Current or Ex-Partner: No    Emotionally Abused: No    Physically Abused: No    Sexually Abused: No    Review of Systems:    Constitutional: No weight loss, fever, chills, weakness or fatigue HEENT: Eyes: No change in vision               Ears, Nose, Throat:  No change in hearing or congestion Skin: No rash or itching Cardiovascular: No chest pain, chest pressure or palpitations   Respiratory: No SOB or cough Gastrointestinal: See HPI and otherwise negative Genitourinary: No dysuria or change in urinary frequency Neurological: No headache, dizziness or syncope Musculoskeletal: No new muscle or joint pain Hematologic: No bleeding or bruising Psychiatric: No history of depression or anxiety    Physical Exam:  Vital signs: BP 98/60   Pulse 78   Ht 5\' 6"  (1.676 m)   Wt 103 lb 6 oz (46.9 kg)   BMI 16.69 kg/m   Constitutional: NAD, Well developed, Well nourished, alert and cooperative Head:  Normocephalic and atraumatic. Eyes:   PEERL, EOMI. No icterus. Conjunctiva pink. Respiratory: Respirations even and unlabored. Lungs clear to auscultation bilaterally.   No wheezes, crackles, or rhonchi.  Cardiovascular:  Regular rate and rhythm. No peripheral edema, cyanosis or pallor.  Gastrointestinal:  Soft, nondistended, nontender. No rebound or guarding. Normal bowel sounds. No appreciable masses or hepatomegaly. Rectal:  Not performed.  Msk:  Symmetrical without gross deformities. Without edema, no deformity or joint abnormality.  Neurologic:  Alert and  oriented x4;  grossly normal neurologically.  Skin:   Dry and intact without significant lesions or rashes. Psychiatric: Oriented to person, place and time. Demonstrates good judgement and reason without abnormal affect or  behaviors.   RELEVANT LABS AND IMAGING: CBC    Component Value Date/Time   WBC 10.4 05/06/2023  1726   RBC 3.86 (L) 05/06/2023 1726   HGB 11.9 (L) 05/06/2023 1726   HCT 35.2 (L) 05/06/2023 1726   PLT 282 05/06/2023 1726   MCV 91.2 05/06/2023 1726   MCH 30.8 05/06/2023 1726   MCHC 33.8 05/06/2023 1726   RDW 12.2 05/06/2023 1726   LYMPHSABS 2,450 08/24/2021 0755   MONOABS 0.3 05/27/2020 1634   EOSABS 50 08/24/2021 0755   BASOSABS 70 08/24/2021 0755    CMP     Component Value Date/Time   NA 138 05/06/2023 1726   K 3.8 05/06/2023 1726   CL 103 05/06/2023 1726   CO2 28 05/06/2023 1726   GLUCOSE 123 (H) 05/06/2023 1726   BUN 15 05/06/2023 1726   CREATININE 0.80 05/06/2023 1726   CREATININE 0.75 11/08/2022 0835   CALCIUM 9.5 05/06/2023 1726   PROT 7.0 05/06/2023 1726   ALBUMIN 4.4 05/06/2023 1726   AST 22 05/06/2023 1726   ALT 17 05/06/2023 1726   ALKPHOS 38 05/06/2023 1726   BILITOT 0.8 05/06/2023 1726   GFRNONAA >60 05/06/2023 1726   GFRNONAA 79 09/20/2020 0000   GFRAA 91 09/20/2020 0000     Assessment/Plan:      Recurrent Diverticulitis Two confirmed episodes of diverticulitis since September 2024, treated with Augmentin for 8 days and Cipro/Flagyl for 10 days (most recently 05/06/23). CT scans from September and November 2024 showed acute sigmoid diverticulitis without perforation or abscess. Current symptoms improved but still present.  Question if patient may be having a smoldering diverticulitis that has not improved.  Last colonoscopy in 2014 was normal.  Negative Cologuard this year. --Order repeat CT scan in 6 weeks to assess inflammation. --Pending CT scan will recommend colonoscopy for further evaluation  Unintentional Weight Loss Reported loss of 15 pounds over the past year. No concerns on CT or labs. -Continue monitoring weight. - colonoscopy in the near future for further evaluation  Increased Urinary Frequency Possibly from recurrent abx -Monitor  symptoms  Loose stools post abx therapy -Check for C. diff due to recent antibiotic use and ongoing loose stools. -Redraw CBC to assess slight anemia noted on previous labs.     Lara Mulch [facility] 05/20/2023, 10:53 AM  Cc: Jomarie Longs, PA-C

## 2023-05-20 NOTE — Patient Instructions (Addendum)
You will be contacted by [facility] Scheduling in the next 2 days to arrange a CT Abdomen/Pelvis.  The number on your caller ID will be [phone], please answer when they call.  If you have not heard from them in 2 days please call [phone] to schedule.  Schedule around or after Christmas  Your provider has requested that you go to the basement level for lab work before leaving today. Press "B" on the elevator. The lab is located at the first door on the left as you exit the elevator.   Due to recent changes in healthcare laws, you may see the results of your imaging and laboratory studies on MyChart before your provider has had a chance to review them.  We understand that in some cases there may be results that are confusing or concerning to you. Not all laboratory results come back in the same time frame and the provider may be waiting for multiple results in order to interpret others.  Please give [place] 48 hours in order for your provider to thoroughly review all the results before contacting the office for clarification of your results.    _______________________________________________________  If your blood pressure at your visit was 140/90 or greater, please contact your primary care physician to follow up on this.  _______________________________________________________  If you are age 43 or older, your body mass index should be between 23-30. Your Body mass index is 16.69 kg/m. If this is out of the aforementioned range listed, please consider follow up with your Primary Care Provider.  If you are age 35 or younger, your body mass index should be between 19-25. Your Body mass index is 16.69 kg/m. If this is out of the aformentioned range listed, please consider follow up with your Primary Care Provider.   ________________________________________________________  The [facility] providers would like to encourage you to use [facility] to communicate with providers for non-urgent  requests or questions.  Due to long hold times on the telephone, sending your provider a message by [facility] may be a faster and more efficient way to get a response.  Please allow 48 business hours for a response.  Please remember that this is for non-urgent requests.  _______________________________________________________   I appreciate the  opportunity to care for you  Thank You   [facility]

## 2023-05-20 NOTE — Progress Notes (Signed)
Attending Physician's Attestation   I have reviewed the chart.   I agree with the Advanced Practitioner's note, impression, and recommendations with any updates as below. Very reasonable plan.  With our colonoscopy availability, it may make sense for [place] to tentatively plan colonoscopy in 2-3 months with the repeat CT scan being completed as planned (put as urgent so we can get a result quickly).  If patient is found to have iron deficiency, may need an upper endoscopy as well.  Agree with further workup as outlined.   Corliss Parish, MD [facility] Advanced Endoscopy Office # [phone]

## 2023-05-22 ENCOUNTER — Other Ambulatory Visit: Payer: Medicare HMO

## 2023-05-22 DIAGNOSIS — Z8719 Personal history of other diseases of the digestive system: Secondary | ICD-10-CM | POA: Diagnosis not present

## 2023-05-24 LAB — C. DIFFICILE GDH AND TOXIN A/B
GDH ANTIGEN: NOT DETECTED
MICRO NUMBER:: 15787841
SPECIMEN QUALITY:: ADEQUATE
TOXIN A AND B: NOT DETECTED

## 2023-05-30 ENCOUNTER — Telehealth: Payer: Self-pay | Admitting: Gastroenterology

## 2023-05-30 NOTE — Telephone Encounter (Signed)
Discussed CT with patient as well as the purpose of IV and oral contrast for the test. Patient verbalizes understanding and states she feels better now that this has been explained to her. Advised patient to call back with any additional concerns or questions.

## 2023-05-30 NOTE — Telephone Encounter (Signed)
Inbound call from patient, would like to get clarification on CT scan scheduled. She states she has never done oral and IV contrast together. She would like to know why both have to be done. She states she does not wish to be overdosed with contrast.

## 2023-06-14 ENCOUNTER — Other Ambulatory Visit: Payer: Medicare HMO

## 2023-06-14 ENCOUNTER — Ambulatory Visit
Admission: RE | Admit: 2023-06-14 | Discharge: 2023-06-14 | Disposition: A | Discharge status: Home / Self Care | Payer: Medicare HMO | Source: Ambulatory Visit | Attending: Physician Assistant | Admitting: Physician Assistant

## 2023-06-14 ENCOUNTER — Other Ambulatory Visit: Payer: Self-pay | Admitting: Physician Assistant

## 2023-06-14 DIAGNOSIS — N644 Mastodynia: Secondary | ICD-10-CM

## 2023-06-24 ENCOUNTER — Other Ambulatory Visit: Payer: Medicare HMO

## 2023-06-27 ENCOUNTER — Other Ambulatory Visit: Payer: Self-pay | Admitting: Physician Assistant

## 2023-06-27 ENCOUNTER — Other Ambulatory Visit (HOSPITAL_COMMUNITY): Payer: Self-pay | Admitting: Psychiatry

## 2023-06-27 DIAGNOSIS — M5412 Radiculopathy, cervical region: Secondary | ICD-10-CM

## 2023-06-27 DIAGNOSIS — R4589 Other symptoms and signs involving emotional state: Secondary | ICD-10-CM

## 2023-06-27 DIAGNOSIS — F419 Anxiety disorder, unspecified: Secondary | ICD-10-CM

## 2023-06-27 DIAGNOSIS — M5030 Other cervical disc degeneration, unspecified cervical region: Secondary | ICD-10-CM

## 2023-06-28 ENCOUNTER — Telehealth (HOSPITAL_COMMUNITY): Payer: Self-pay | Admitting: Psychiatry

## 2023-06-28 DIAGNOSIS — F319 Bipolar disorder, unspecified: Secondary | ICD-10-CM

## 2023-06-28 DIAGNOSIS — F419 Anxiety disorder, unspecified: Secondary | ICD-10-CM

## 2023-06-28 DIAGNOSIS — M5412 Radiculopathy, cervical region: Secondary | ICD-10-CM

## 2023-06-28 DIAGNOSIS — M5030 Other cervical disc degeneration, unspecified cervical region: Secondary | ICD-10-CM

## 2023-06-28 MED ORDER — CITALOPRAM HYDROBROMIDE 10 MG PO TABS
10.0000 mg | ORAL_TABLET | Freq: Every day | ORAL | 0 refills | Status: DC
Start: 2023-06-28 — End: 2023-09-24

## 2023-06-28 MED ORDER — GABAPENTIN 100 MG PO CAPS
100.0000 mg | ORAL_CAPSULE | Freq: Three times a day (TID) | ORAL | 0 refills | Status: DC
Start: 2023-06-28 — End: 2023-08-06

## 2023-06-28 NOTE — Telephone Encounter (Signed)
 Last fill 03/27/23 with 2 refills last visit 03/13/23

## 2023-06-28 NOTE — Telephone Encounter (Signed)
 I have sent Celexa and gabapentin to pharmacy for 30 days supply.

## 2023-06-28 NOTE — Telephone Encounter (Signed)
 Patient called stating pharmacy had faxed request X 2 for refill on 2 medications. No faxes have been received however. Patients states she will be out of these medications this weekend. Patient requests refills of:  citalopram  (CELEXA ) 10 MG tablet  Last ordered: 04/01/2023 - 30 tablets with 2 refills  gabapentin  (NEURONTIN ) 100 MG capsule  Last ordered: 04/26/2023 - 90 capsules with 1 refill   [facility] [place], [place] - [address] (Ph: [phone])   Last visit: 04/01/2023  Next visit: 09/30/2023

## 2023-06-28 NOTE — Telephone Encounter (Signed)
..  PDMP reviewed during this encounter. Sent refills.

## 2023-06-28 NOTE — Telephone Encounter (Signed)
 Copied from CRM [id number]. Topic: Clinical - Medication Refill >> Jun 28, 2023 11:00 AM Franky GRADE wrote: Most Recent Primary Care Visit:  Provider: ANTONIETTE VERMELL CROME  Department: [facility]  Visit Type: OFFICE VISIT  Date: 03/13/2023  Medication: ALPRAZolam  (XANAX ) 0.5 MG tablet  Has the patient contacted their pharmacy? Yes, pharmacy has sent multiple request to the office with no replies.  (Agent: If no, request that the patient contact the pharmacy for the refill. If patient does not wish to contact the pharmacy document the reason why and proceed with request.) (Agent: If yes, when and what did the pharmacy advise?)  Is this the correct pharmacy for this prescription? Yes If no, delete pharmacy and type the correct one.  This is the patient's preferred pharmacy:  [facility] [place], [place] - [id number] [place] [address] Phone: [phone] Fax: [phone]   Has the prescription been filled recently? No  Is the patient out of the medication? Yes, patient is completely out.   Has the patient been seen for an appointment in the last year OR does the patient have an upcoming appointment? Yes, last appointment was on 03/13/2023  Can we respond through MyChart? Yes  Agent: Please be advised that Rx refills may take up to 3 business days. We ask that you follow-up with your pharmacy.

## 2023-07-01 ENCOUNTER — Other Ambulatory Visit: Payer: Medicare HMO

## 2023-07-08 ENCOUNTER — Other Ambulatory Visit: Payer: Medicare HMO

## 2023-07-29 ENCOUNTER — Other Ambulatory Visit: Payer: Self-pay | Admitting: Physician Assistant

## 2023-08-06 ENCOUNTER — Telehealth: Payer: Self-pay | Admitting: Physician Assistant

## 2023-08-06 ENCOUNTER — Telehealth (HOSPITAL_COMMUNITY): Payer: Self-pay | Admitting: *Deleted

## 2023-08-06 DIAGNOSIS — M5030 Other cervical disc degeneration, unspecified cervical region: Secondary | ICD-10-CM

## 2023-08-06 DIAGNOSIS — M5412 Radiculopathy, cervical region: Secondary | ICD-10-CM

## 2023-08-06 MED ORDER — GABAPENTIN 100 MG PO CAPS
100.0000 mg | ORAL_CAPSULE | Freq: Three times a day (TID) | ORAL | 1 refills | Status: DC
Start: 2023-08-06 — End: 2023-09-19

## 2023-08-06 NOTE — Telephone Encounter (Signed)
Copied from CRM [id number]. Topic: General - Other >> Aug 06, 2023  9:10 AM Nila Nephew wrote: Reason for CRM: Patient calling to request visit mode for scheduled AWV be changed to virtual.

## 2023-08-06 NOTE — Addendum Note (Signed)
Addended by: Thresa Ross on: 08/06/2023 10:19 AM   Modules accepted: Orders

## 2023-08-06 NOTE — Telephone Encounter (Signed)
REFILL REQUEST  gabapentin (NEURONTIN) 100 MG capsule    [facility] [place], [place] - [address]    Rush Farmer APPT  09/30/23 LAST APPT   04/01/23

## 2023-08-09 ENCOUNTER — Ambulatory Visit: Payer: Medicare HMO | Admitting: Gastroenterology

## 2023-08-12 ENCOUNTER — Ambulatory Visit: Payer: Medicare HMO

## 2023-08-12 DIAGNOSIS — R1032 Left lower quadrant pain: Secondary | ICD-10-CM | POA: Diagnosis not present

## 2023-08-12 DIAGNOSIS — K5730 Diverticulosis of large intestine without perforation or abscess without bleeding: Secondary | ICD-10-CM | POA: Diagnosis not present

## 2023-08-12 DIAGNOSIS — Z8719 Personal history of other diseases of the digestive system: Secondary | ICD-10-CM

## 2023-08-12 DIAGNOSIS — K5792 Diverticulitis of intestine, part unspecified, without perforation or abscess without bleeding: Secondary | ICD-10-CM | POA: Diagnosis not present

## 2023-08-12 MED ORDER — IOHEXOL 9 MG/ML PO SOLN
500.0000 mL | ORAL | Status: AC
  Administered 2023-08-12 (×2): 500 mL via ORAL

## 2023-08-12 MED ORDER — IOHEXOL 300 MG/ML  SOLN
100.0000 mL | Freq: Once | INTRAMUSCULAR | Status: AC | PRN
  Administered 2023-08-12: 80 mL via INTRAVENOUS

## 2023-08-13 ENCOUNTER — Ambulatory Visit (INDEPENDENT_AMBULATORY_CARE_PROVIDER_SITE_OTHER): Payer: Medicare HMO

## 2023-08-13 ENCOUNTER — Ambulatory Visit: Payer: Medicare HMO

## 2023-08-13 ENCOUNTER — Encounter: Payer: Self-pay | Admitting: Physician Assistant

## 2023-08-13 VITALS — Ht 65.0 in | Wt 104.0 lb

## 2023-08-13 DIAGNOSIS — Z0000 Encounter for general adult medical examination without abnormal findings: Secondary | ICD-10-CM | POA: Diagnosis not present

## 2023-08-13 DIAGNOSIS — K649 Unspecified hemorrhoids: Secondary | ICD-10-CM

## 2023-08-13 MED ORDER — HYDROCORTISONE (PERIANAL) 2.5 % EX CREA: 1.0000 | TOPICAL_CREAM | Freq: Two times a day (BID) | CUTANEOUS | 1 refills | Status: AC | PRN

## 2023-08-13 NOTE — Progress Notes (Signed)
 Subjective:   Kellie [name] is a 68 y.o. female who presents for Medicare Annual (Subsequent) preventive examination.  Visit Complete: Virtual I connected with  Kellie [name] on 08/13/23 by a audio enabled telemedicine application and verified that I am speaking with the correct person using two identifiers.  Patient Location: Home  Provider Location: Office/Clinic  I discussed the limitations of evaluation and management by telemedicine. The patient expressed understanding and agreed to proceed.  Vital Signs: Because this visit was a virtual/telehealth visit, some criteria may be missing or patient reported. Any vitals not documented were not able to be obtained and vitals that have been documented are patient reported.  Patient Medicare AWV questionnaire was completed by the patient on 08/04/23; I have confirmed that all information answered by patient is correct and no changes since this date.  Cardiac Risk Factors include: advanced age (>60men, >94 women);family history of premature cardiovascular disease;hypertension     Objective:    Today's Vitals   08/13/23 1454  Weight: 104 lb (47.2 kg)  Height: 5\' 5"  (1.651 m)  PainSc: 9    Body mass index is 17.31 kg/m.     08/13/2023    3:13 PM 05/06/2023    5:25 PM 03/02/2023    8:10 AM 08/03/2022    8:52 AM 11/14/2020    2:11 PM 08/06/2019    7:36 AM 03/23/2019    9:35 AM  Advanced Directives  Does Patient Have a Medical Advance Directive? No No No No No No No  Would patient like information on creating a medical advance directive? No - Patient declined  No - Patient declined No - Patient declined  No - Patient declined No - Patient declined    Current Medications (verified) Outpatient Encounter Medications as of 08/13/2023  Medication Sig   acyclovir (ZOVIRAX) 200 MG capsule TAKE 2 CAPSULES BY MOUTH TWICE DAILY.   ALPRAZolam (XANAX) 0.5 MG tablet TAKE 1 TABLET BY MOUTH DAILY AS NEEDED FOR ANXIETY.   AMBULATORY NON FORMULARY  MEDICATION Incentive spirometer use as directed - fax to [facility] [phone]   ascorbic acid (VITAMIN C) 100 MG tablet Take 1 tablet by mouth daily.   cetirizine (ZYRTEC) 10 MG tablet Take by mouth.   Cholecalciferol (VITAMIN D) 2000 units CAPS Take by mouth.   gabapentin (NEURONTIN) 100 MG capsule Take 1 capsule (100 mg total) by mouth 3 (three) times daily.   hydrocortisone (ANUSOL-HC) 2.5 % rectal cream Place 1 Application rectally 2 (two) times daily as needed for hemorrhoids or anal itching.   Menaquinone-7 (VITAMIN K2) 100 MCG CAPS Take by mouth.   pantoprazole (PROTONIX) 40 MG tablet TAKE (1) TABLET BY MOUTH TWICE DAILY.   Probiotic Product (PROBIOTIC & ACIDOPHILUS EX ST PO) Take by mouth.    albuterol (VENTOLIN HFA) 108 (90 Base) MCG/ACT inhaler Inhale 2 puffs into the lungs every 4 (four) hours as needed for wheezing or shortness of breath. (Patient not taking: Reported on 08/13/2023)   alendronate (FOSAMAX) 70 MG tablet Take by mouth. (Patient not taking: Reported on 08/13/2023)   citalopram (CELEXA) 10 MG tablet Take 1 tablet (10 mg total) by mouth daily.   famotidine (PEPCID) 20 MG tablet Take 1 tablet (20 mg total) by mouth at bedtime. (Patient not taking: Reported on 08/13/2023)   metoprolol tartrate (LOPRESSOR) 25 MG tablet TAKE 2 HOURS PRIOR TO CT SCAN (Patient not taking: Reported on 08/13/2023)   [DISCONTINUED] amoxicillin-clavulanate (AUGMENTIN) 875-125 MG tablet Take 1 tablet by mouth  every 12 (twelve) hours.   [DISCONTINUED] fexofenadine (ALLEGRA) 180 MG tablet Take 1 tablet (180 mg total) by mouth daily.   No facility-administered encounter medications on file as of 08/13/2023.    Allergies (verified) Codeine, Hydrocodone, Morphine, Abilify [aripiprazole], Morphine and codeine, Percocet [oxycodone-acetaminophen], and Vicodin [hydrocodone-acetaminophen]   History: Past Medical History:  Diagnosis Date   Abnormal Pap smear of cervix    Allergic rhinitis  11/08/2010   Anxiety    Asthma    Bronchitis    GERD (gastroesophageal reflux disease)    Herpes simplex of female genitalia    Hiatal hernia    Hyperlipidemia    Mitral valve prolapse    Osteoporosis 11/21/2011   Thyroid nodule    Urticaria    Past Surgical History:  Procedure Laterality Date   CLOSED MANIPULATION SHOULDER  7&01/2005   x2, 30 days apart   CLOSED MANIPULATION SHOULDER  2006   Left   COLONOSCOPY  09/2012   Medium sized external hemorrhoids, few small divertic in left colon, otherwise normal colon (repeat 10 yrs)   ESOPHAGOGASTRODUODENOSCOPY  09/2012   Normal (Dr. Christella Hartigan)   LEEP  11/2004   Family History  Problem Relation Age of Onset   Kidney cancer Mother 70   COPD Mother    Kidney disease Mother    Allergic rhinitis Mother    Asthma Mother    Heart disease Father    Allergic rhinitis Father    COPD Sister    Depression Sister    Pancreatic cancer Sister    Hypothyroidism Sister    Hypothyroidism Sister    Stroke Sister    Heart disease Brother    Heart attack Brother    Depression Maternal Aunt    Heart disease Paternal Uncle    Depression Maternal Grandfather    Diabetes Other        neice   Thyroid disease Other        neice   Colon cancer Neg Hx    Rectal cancer Neg Hx    Stomach cancer Neg Hx    Colon polyps Neg Hx    Esophageal cancer Neg Hx    Social History   Socioeconomic History   Marital status: Divorced    Spouse name: Not on file   Number of children: 1   Years of education: 9   Highest education level: 9th grade  Occupational History    Comment: Retired  Tobacco Use   Smoking status: Never   Smokeless tobacco: Never  Vaping Use   Vaping status: Never Used  Substance and Sexual Activity   Alcohol use: No   Drug use: No   Sexual activity: Not Currently    Birth control/protection: Post-menopausal  Other Topics Concern   Not on file  Social History Narrative   Lives with her dog. Her son lives about 28 miles from  her; her sister close by. She enjoys shopping, church and exercising.   Social Drivers of Corporate investment banker Strain: Low Risk  (08/13/2023)   Overall Financial Resource Strain (CARDIA)    Difficulty of Paying Living Expenses: Not hard at all  Food Insecurity: No Food Insecurity (08/13/2023)   Hunger Vital Sign    Worried About Running Out of Food in the Last Year: Never true    Ran Out of Food in the Last Year: Never true  Transportation Needs: No Transportation Needs (08/13/2023)   PRAPARE - Administrator, Civil Service (Medical): No  Lack of Transportation (Non-Medical): No  Physical Activity: Sufficiently Active (08/13/2023)   Exercise Vital Sign    Days of Exercise per Week: 7 days    Minutes of Exercise per Session: 30 min  Stress: No Stress Concern Present (08/13/2023)   [employer] of Occupational Health - Occupational Stress Questionnaire    Feeling of Stress : Not at all  Social Connections: Socially Integrated (08/13/2023)   Social Connection and Isolation Panel [NHANES]    Frequency of Communication with Friends and Family: More than three times a week    Frequency of Social Gatherings with Friends and Family: Twice a week    Attends Religious Services: More than 4 times per year    Active Member of [employer] or Organizations: Yes    Attends Engineer, structural: More than 4 times per year    Marital Status: Married    Tobacco Counseling Counseling given: Not Answered   Clinical Intake:  Pre-visit preparation completed: Yes  Pain : 0-10 Pain Score: 9  Pain Type: Acute pain Pain Location: Rib cage Pain Orientation: Right Pain Descriptors / Indicators: Sore Pain Onset: Today Pain Frequency: Constant Pain Relieving Factors: Tylenol Effect of Pain on Daily Activities: No  Pain Relieving Factors: Tylenol  BMI - recorded: 17.31 Nutritional Status: BMI <19  Underweight Nutritional Risks: None Diabetes: No  How often do you need  to have someone help you when you read instructions, pamphlets, or other written materials from your doctor or pharmacy?: 1 - Never What is the last grade level you completed in school?: 9  Interpreter Needed?: No      Activities of Daily Living    08/13/2023    2:59 PM  In your present state of health, do you have any difficulty performing the following activities:  Hearing? 0  Vision? 1  Difficulty concentrating or making decisions? 1  Walking or climbing stairs? 0  Dressing or bathing? 0  Doing errands, shopping? 0  Preparing Food and eating ? N  Using the Toilet? N  In the past six months, have you accidently leaked urine? N  Do you have problems with loss of bowel control? N  Managing your Medications? N  Managing your Finances? N  Housekeeping or managing your Housekeeping? N    Patient Care Team: Nolene Ebbs as PCP - General (Family Medicine) Mansouraty, Netty Starring., MD as Consulting Physician (Gastroenterology) Thresa Ross, MD as Consulting Physician (Psychiatry) Jens Som Madolyn Frieze, MD as Consulting Physician (Cardiology) Jomarie Longs, MD as Consulting Physician (Psychiatry)  Indicate any recent Medical Services you may have received from other than [facility] providers in the past year (date may be approximate).     Assessment:   This is a routine wellness examination for Siham.  Hearing/Vision screen Hearing Screening - Comments:: Unable to test Vision Screening - Comments:: Unable to test.    Goals Addressed             This Visit's Progress    Gain weight       She would like to gain weight. She would also like to get closer to God.       Depression Screen    08/13/2023    3:12 PM 08/03/2022    8:53 AM 03/19/2022   10:55 AM 02/15/2021   10:14 AM 09/20/2020    8:59 AM 02/15/2020    9:38 AM 02/13/2019    8:40 AM  PHQ 2/9 Scores  PHQ - 2 Score 0 0 3 0  2 4 3   PHQ- 9 Score   17 4 9 20 9     Fall Risk    08/13/2023    3:14 PM 08/03/2022     8:53 AM 02/15/2021   10:13 AM 09/20/2020    8:59 AM 02/15/2020    9:38 AM  Fall Risk   Falls in the past year? 0 0 0 1 0  Number falls in past yr: 0 0 0 1   Injury with Fall? 0 0 0 0   Risk for fall due to : No Fall Risks No Fall Risks  History of fall(s)   Follow up Falls evaluation completed Falls evaluation completed  Falls evaluation completed     MEDICARE RISK AT HOME: Medicare Risk at Home Any stairs in or around the home?: Yes If so, are there any without handrails?: No Home free of loose throw rugs in walkways, pet beds, electrical cords, etc?: Yes Adequate lighting in your home to reduce risk of falls?: Yes Life alert?: No Use of a cane, walker or w/c?: No Grab bars in the bathroom?: No Shower chair or bench in shower?: No Elevated toilet seat or a handicapped toilet?: No  TIMED UP AND GO:  Was the test performed?  No    Cognitive Function:        08/13/2023    3:16 PM 08/03/2022    9:03 AM 02/15/2021    9:44 AM  6CIT Screen  What Year? 0 points 0 points 0 points  What month? 0 points 0 points 0 points  What time? 0 points 0 points 0 points  Count back from 20 0 points 0 points 0 points  Months in reverse 0 points 2 points 0 points  Repeat phrase 2 points 0 points 4 points  Total Score 2 points 2 points 4 points    Immunizations Immunization History  Administered Date(s) Administered   Tdap 06/25/2005    TDAP status: Due, Education has been provided regarding the importance of this vaccine. Advised may receive this vaccine at local pharmacy or Health Dept. Aware to provide a copy of the vaccination record if obtained from local pharmacy or Health Dept. Verbalized acceptance and understanding.  Flu Vaccine status: Declined, Education has been provided regarding the importance of this vaccine but patient still declined. Advised may receive this vaccine at local pharmacy or Health Dept. Aware to provide a copy of the vaccination record if obtained from local  pharmacy or Health Dept. Verbalized acceptance and understanding.  Pneumococcal vaccine status: Declined,  Education has been provided regarding the importance of this vaccine but patient still declined. Advised may receive this vaccine at local pharmacy or Health Dept. Aware to provide a copy of the vaccination record if obtained from local pharmacy or Health Dept. Verbalized acceptance and understanding.   Covid-19 vaccine status: Declined, Education has been provided regarding the importance of this vaccine but patient still declined. Advised may receive this vaccine at local pharmacy or Health Dept.or vaccine clinic. Aware to provide a copy of the vaccination record if obtained from local pharmacy or Health Dept. Verbalized acceptance and understanding.  Qualifies for Shingles Vaccine? Yes   Zostavax completed No   Shingrix Completed?: No.    Education has been provided regarding the importance of this vaccine. Patient has been advised to call insurance company to determine out of pocket expense if they have not yet received this vaccine. Advised may also receive vaccine at local pharmacy or Health Dept. Verbalized acceptance and understanding.  Screening Tests Health Maintenance  Topic Date Due   Pneumonia Vaccine 68+ Years old (1 of 2 - PCV) Never done   Zoster Vaccines- Shingrix (1 of 2) Never done   INFLUENZA VACCINE  09/23/2023 (Originally 01/24/2023)   Colonoscopy  03/12/2024 (Originally 10/16/2022)   MAMMOGRAM  04/30/2024   Medicare Annual Wellness (AWV)  08/12/2024   DEXA SCAN  03/26/2025   Hepatitis C Screening  Completed   HPV VACCINES  Aged Out   DTaP/Tdap/Td  Discontinued   COVID-19 Vaccine  Discontinued    Health Maintenance  Health Maintenance Due  Topic Date Due   Pneumonia Vaccine 53+ Years old (1 of 2 - PCV) Never done   Zoster Vaccines- Shingrix (1 of 2) Never done    Colorectal cancer screening: Type of screening: Colonoscopy. Completed 10/15/2012. Repeat every 10  years She has a GI provider.    Mammogram status: Completed 05/01/2023. Repeat every year  Bone Density status: Completed 03/27/2023. Results reflect: Bone density results: OSTEOPOROSIS. Repeat every 2 years.  Lung Cancer Screening: (Low Dose CT Chest recommended if Age 19-80 years, 20 pack-year currently smoking OR have quit w/in 15years.) does not qualify.   Lung Cancer Screening Referral: n/a  Additional Screening:  Hepatitis C Screening: does qualify; Completed 11/07/2015  Vision Screening: Recommended annual ophthalmology exams for early detection of glaucoma and other disorders of the eye. Is the patient up to date with their annual eye exam?  Yes  Who is the provider or what is the name of the office in which the patient attends annual eye exams? Dr Tressia Danas  If pt is not established with a provider, would they like to be referred to a provider to establish care?  N/a .   Dental Screening: Recommended annual dental exams for proper oral hygiene   Community Resource Referral / Chronic Care Management: CRR required this visit?  No   CCM required this visit?  No     Plan:     I have personally reviewed and noted the following in the patient's chart:   Medical and social history Use of alcohol, tobacco or illicit drugs  Current medications and supplements including opioid prescriptions. Patient is not currently taking opioid prescriptions. Functional ability and status Nutritional status Physical activity Advanced directives List of other physicians Hospitalizations, surgeries, and ER visits in previous 12 months Vitals Screenings to include cognitive, depression, and falls Referrals and appointments  In addition, I have reviewed and discussed with patient certain preventive protocols, quality metrics, and best practice recommendations. A written personalized care plan for preventive services as well as general preventive health recommendations were provided to  patient.     Esmond Harps, CMA   08/13/2023   After Visit Summary: (MyChart) Due to this being a telephonic visit, the after visit summary with patients personalized plan was offered to patient via MyChart   Nurse Notes:   MITCHELLE SULTAN is a 68 y.o. female patient of Jomarie Longs, PA-C who had a Medicare Annual Wellness Visit today via telephone. Chiyeko is Retired and lives alone. she has 1 child. she reports that she is socially active and does interact with friends/family regularly. she is moderately physically active and enjoys shopping.   Patient declines all vaccines.

## 2023-08-13 NOTE — Patient Instructions (Signed)
  Kellie [name] , Thank you for taking time to come for your Medicare Wellness Visit. I appreciate your ongoing commitment to your health goals. Please review the following plan we discussed and let me know if I can assist you in the future.   These are the goals we discussed:  Goals       Gain weight      She would like to gain weight. She would also like to get closer to God.       Patient Stated (pt-stated)      Patient stated that she would like to work on their strengthen her muscles and get her LDL lower.        This is a list of the screening recommended for you and due dates:  Health Maintenance  Topic Date Due   Pneumonia Vaccine (1 of 2 - PCV) Never done   Zoster (Shingles) Vaccine (1 of 2) Never done   Flu Shot  09/23/2023*   Colon Cancer Screening  03/12/2024*   Mammogram  04/30/2024   Medicare Annual Wellness Visit  08/12/2024   DEXA scan (bone density measurement)  03/26/2025   Hepatitis C Screening  Completed   HPV Vaccine  Aged Out   DTaP/Tdap/Td vaccine  Discontinued   COVID-19 Vaccine  Discontinued  *Topic was postponed. The date shown is not the original due date.

## 2023-08-16 ENCOUNTER — Telehealth: Payer: Self-pay | Admitting: Gastroenterology

## 2023-08-16 NOTE — Telephone Encounter (Signed)
 Patient called and stated that she had done a CT scan in [place] but she is just now calling back due to her being sick. Patient stated that she would like to know those results. Patient is requesting a call back. Please advise.

## 2023-08-16 NOTE — Telephone Encounter (Signed)
 Patient is advised that we do not currently have CT results yet. Advised that we have been seeing around a 2 week turn around time for results of outpatient imaging studies. I reassured patient that we would be in touch with results as available. She verbalizes understanding.

## 2023-08-18 ENCOUNTER — Other Ambulatory Visit: Payer: Self-pay | Admitting: Physician Assistant

## 2023-08-18 DIAGNOSIS — R4589 Other symptoms and signs involving emotional state: Secondary | ICD-10-CM

## 2023-08-18 DIAGNOSIS — F419 Anxiety disorder, unspecified: Secondary | ICD-10-CM

## 2023-08-19 ENCOUNTER — Ambulatory Visit: Payer: Self-pay | Admitting: Physician Assistant

## 2023-08-19 NOTE — Telephone Encounter (Signed)
  Chief Complaint: chest pain/injury; states could have pulled muscle but recent test results show an inflamed esophagus.  Symptoms: pain Frequency: constant Pertinent Negatives: Patient denies sob, numbness and weakness, fever, pain radiating. Disposition: [] ED /[] Urgent Care (no appt availability in office) / [x] Appointment(In office/virtual)/ []  [place] [facility]/ [] Home Care/ [] Refused Recommended Disposition /[] [facility]/ []  Follow-up with PCP Additional Notes: per protocol, apt made.  Care advice given, denies questions, instructed to go to the ER if becomes worse.   Reason for Disposition  [1] MODERATE pain (e.g., interferes with normal activities) AND [2] high-risk adult (e.g., age > 60 years, osteoporosis, chronic steroid use)  Answer Assessment - Initial Assessment Questions 1. MECHANISM: "How did the injury happen?"     Last week 2. ONSET: "When did the injury happen?" (Minutes or hours ago)     Pulled muscle, last week 3. LOCATION: "Where on the chest is the injury located?"     Right side, and left side 4. APPEARANCE: "What does the injury look like?"     Pulled muscle in chest 5. BLEEDING: "Is there any bleeding now? If Yes, ask: How long has it been bleeding?"     na 6. SEVERITY: "Any difficulty with breathing?"     Yes, constant pain 7. SIZE: For cuts, bruises, or swelling, ask: "How large is it?" (e.g., inches or centimeters)     na 8. PAIN: "Is there pain?" If Yes, ask: "How bad is the pain?"   (e.g., Scale 1-10; or mild, moderate, severe)     8/10 9. TETANUS: For any breaks in the skin, ask: "When was the last tetanus booster?"     na 10. PREGNANCY: "Is there any chance you are pregnant?" "When was your last menstrual period?"       na  Protocols used: Chest Injury-A-AH

## 2023-08-21 ENCOUNTER — Ambulatory Visit: Payer: Medicare HMO

## 2023-08-21 ENCOUNTER — Encounter: Payer: Self-pay | Admitting: Physician Assistant

## 2023-08-21 ENCOUNTER — Ambulatory Visit: Payer: Medicare HMO | Admitting: Physician Assistant

## 2023-08-21 VITALS — BP 112/68 | HR 63 | Ht 66.0 in | Wt 107.5 lb

## 2023-08-21 DIAGNOSIS — F419 Anxiety disorder, unspecified: Secondary | ICD-10-CM

## 2023-08-21 DIAGNOSIS — A6004 Herpesviral vulvovaginitis: Secondary | ICD-10-CM | POA: Diagnosis not present

## 2023-08-21 DIAGNOSIS — E559 Vitamin D deficiency, unspecified: Secondary | ICD-10-CM

## 2023-08-21 DIAGNOSIS — R7301 Impaired fasting glucose: Secondary | ICD-10-CM

## 2023-08-21 DIAGNOSIS — R062 Wheezing: Secondary | ICD-10-CM | POA: Diagnosis not present

## 2023-08-21 DIAGNOSIS — R079 Chest pain, unspecified: Secondary | ICD-10-CM | POA: Diagnosis not present

## 2023-08-21 DIAGNOSIS — R599 Enlarged lymph nodes, unspecified: Secondary | ICD-10-CM | POA: Diagnosis not present

## 2023-08-21 DIAGNOSIS — R0789 Other chest pain: Secondary | ICD-10-CM | POA: Diagnosis not present

## 2023-08-21 DIAGNOSIS — K2100 Gastro-esophageal reflux disease with esophagitis, without bleeding: Secondary | ICD-10-CM | POA: Diagnosis not present

## 2023-08-21 DIAGNOSIS — T7840XD Allergy, unspecified, subsequent encounter: Secondary | ICD-10-CM

## 2023-08-21 DIAGNOSIS — R4589 Other symptoms and signs involving emotional state: Secondary | ICD-10-CM | POA: Diagnosis not present

## 2023-08-21 MED ORDER — PANTOPRAZOLE SODIUM 40 MG PO TBEC: DELAYED_RELEASE_TABLET | ORAL | 3 refills | Status: AC

## 2023-08-21 MED ORDER — FAMOTIDINE 20 MG PO TABS: 20.0000 mg | ORAL_TABLET | Freq: Two times a day (BID) | ORAL | 3 refills | Status: AC

## 2023-08-21 MED ORDER — ALBUTEROL SULFATE (2.5 MG/3ML) 0.083% IN NEBU
2.5000 mg | INHALATION_SOLUTION | Freq: Once | RESPIRATORY_TRACT | Status: AC
  Administered 2023-08-21: 2.5 mg via RESPIRATORY_TRACT

## 2023-08-21 MED ORDER — ACYCLOVIR 200 MG PO CAPS: 400.0000 mg | ORAL_CAPSULE | Freq: Two times a day (BID) | ORAL | 3 refills | Status: AC

## 2023-08-21 NOTE — Patient Instructions (Signed)
 Start pepcid twice daily with protonix. If no improvement follow up with GI in 4 weeks.  Get CXR today.

## 2023-08-21 NOTE — Progress Notes (Signed)
 Acute Office Visit  Subjective:     Patient ID: Kellie [name], female    DOB: 02/18/1956, 68 y.o.   MRN: [id number]   CC: chest pain   HPI Patient is a 68 yo female who presents with a chief complaint of chest pain. She also believes her esophagus is 'inflammed' and that it feels like she pulled a muscle. She states her pain is more of a 'heaviness' and sometimes is retrosternal. She states her pain is worse after eating. She endorses bad smelling breathe. She states she cannot lay down flat and feels that her food is getting stuck in her esophagus and taking a long time to empty into her stomach. She states her pain is worse at night.   She is taking Protonix 40mg  twice daily for GERD symptoms. She states the Protonix doesn't help her sxs. Her last CT scan revealed symmetric wall thickening of the GE junction, consistent with esophagitis on 08/12/23. She has not had an endoscopy.   She continues to feel anxious. She request labs and refills today.    Review of Systems  Respiratory:  Positive for cough.   Cardiovascular:  Positive for chest pain (GERD-like symptoms).       Chest heaviness  Gastrointestinal:  Positive for abdominal pain, constipation and heartburn. Negative for blood in stool.       Halitosis      Objective:    BP 112/68 (BP Location: Left Arm, Patient Position: Sitting, Cuff Size: Normal)   Pulse 63   Ht 5\' 6"  (1.676 m)   Wt 107 lb 8 oz (48.8 kg)   SpO2 95%   BMI 17.35 kg/m  BP Readings from Last 3 Encounters:  08/21/23 112/68  05/20/23 98/60  05/06/23 (!) 95/57   Wt Readings from Last 3 Encounters:  08/21/23 107 lb 8 oz (48.8 kg)  08/13/23 104 lb (47.2 kg)  05/20/23 103 lb 6 oz (46.9 kg)   SpO2 Readings from Last 3 Encounters:  08/21/23 95%  05/06/23 99%  03/13/23 100%   Physical Exam Constitutional:      Appearance: Normal appearance.  HENT:     Head: Normocephalic and atraumatic.  Eyes:     Extraocular Movements: Extraocular movements  intact.  Cardiovascular:     Rate and Rhythm: Normal rate and regular rhythm.     Pulses: Normal pulses.     Heart sounds: Normal heart sounds.  Pulmonary:     Effort: Pulmonary effort is normal.     Breath sounds: Normal breath sounds.     Comments: Wheezing upon deep inspiration Abdominal:     General: Bowel sounds are normal.     Palpations: Abdomen is soft.     Tenderness: There is abdominal tenderness.  Musculoskeletal:        General: Normal range of motion.     Cervical back: Normal range of motion.  Skin:    General: Skin is warm.  Neurological:     Mental Status: She is alert.    Assessment & Plan:  Kellie KitchenMarland KitchenInetta was seen today for medical management of chronic issues.  Diagnoses and all orders for this visit:  Inspiratory wheezing -     albuterol (PROVENTIL) (2.5 MG/3ML) 0.083% nebulizer solution 2.5 mg -     Cancel: Peak flow meter  Reactive lymphadenopathy -     famotidine (PEPCID) 20 MG tablet; Take 1 tablet (20 mg total) by mouth 2 (two) times daily.  Allergy, subsequent encounter -  famotidine (PEPCID) 20 MG tablet; Take 1 tablet (20 mg total) by mouth 2 (two) times daily.  Herpes simplex vulvovaginitis -     acyclovir (ZOVIRAX) 200 MG capsule; Take 2 capsules (400 mg total) by mouth 2 (two) times daily.  Gastroesophageal reflux disease with esophagitis without hemorrhage -     CMP14+EGFR -     pantoprazole (PROTONIX) 40 MG tablet; TAKE (1) TABLET BY MOUTH TWICE DAILY. -     DG Chest 2 View; Future  Atypical chest pain -     DG Chest 2 View; Future  Vitamin D deficiency -     VITAMIN D 25 Hydroxy (Vit-D Deficiency, Fractures) -     Iodine, Serum/Plasma  Elevated fasting glucose -     Hemoglobin A1c  Anxiety -     Cortisol -     ALPRAZolam (XANAX) 0.5 MG tablet; Take 1 tablet (0.5 mg total) by mouth daily as needed. for anxiety  Anxiety about health -     ALPRAZolam (XANAX) 0.5 MG tablet; Take 1 tablet (0.5 mg total) by mouth daily as needed.  for anxiety    - Patient declines pneumonia vaccine and Shingles vaccine today - Discussed diet changes she can make to reduce inflammation in the esophagus - Start famotidine to reduce acid.  - Continue Protonix for GERD sxs - Monitor sxs for 4 weeks; if sxs are not better patient instructed to return to GI for endoscopy.  - Ordered chest x-ray to assess cough and pain with deep inspiration - Peak expiratory flow ordered today in office- no change in peak flow after nebulizer of albuterol  - Spent 40 minutes reviewing recent CT scan, cardiology reports and explaining results with the patient  Kellie Gaw, PA-C

## 2023-08-21 NOTE — Progress Notes (Signed)
 Kellie [name],   Chest xray is normal. Heart and lungs look great.

## 2023-08-23 ENCOUNTER — Encounter: Payer: Self-pay | Admitting: Physician Assistant

## 2023-08-23 DIAGNOSIS — E782 Mixed hyperlipidemia: Secondary | ICD-10-CM

## 2023-08-23 MED ORDER — ALPRAZOLAM 0.5 MG PO TABS: 0.5000 mg | ORAL_TABLET | Freq: Every day | ORAL | 5 refills | Status: DC | PRN

## 2023-08-23 NOTE — Progress Notes (Signed)
 Jasmine December,   Vitamin D in normal range. You could increase daily dose to get you a little higher or make sure taking with diary for absorption.  A1C has improved!  Cortisol is normal.  Kidney, liver, glucose look great.

## 2023-08-24 LAB — CMP14+EGFR
ALT: 21 IU/L (ref 0–32)
AST: 26 IU/L (ref 0–40)
Albumin: 4.7 g/dL (ref 3.9–4.9)
Alkaline Phosphatase: 47 IU/L (ref 44–121)
BUN/Creatinine Ratio: 25 (ref 12–28)
BUN: 16 mg/dL (ref 8–27)
Bilirubin Total: 0.5 mg/dL (ref 0.0–1.2)
CO2: 22 mmol/L (ref 20–29)
Calcium: 10 mg/dL (ref 8.7–10.3)
Chloride: 102 mmol/L (ref 96–106)
Creatinine, Ser: 0.65 mg/dL (ref 0.57–1.00)
Globulin, Total: 2.2 g/dL (ref 1.5–4.5)
Glucose: 80 mg/dL (ref 70–99)
Potassium: 4.3 mmol/L (ref 3.5–5.2)
Sodium: 142 mmol/L (ref 134–144)
Total Protein: 6.9 g/dL (ref 6.0–8.5)
eGFR: 96 mL/min/{1.73_m2} (ref >59)

## 2023-08-24 LAB — IODINE, SERUM/PLASMA: Iodine: 75.7 ug/L (ref 40.0–92.0)

## 2023-08-24 LAB — HEMOGLOBIN A1C
Est. average glucose Bld gHb Est-mCnc: 108 mg/dL
Hgb A1c MFr Bld: 5.4 % (ref 4.8–5.6)

## 2023-08-24 LAB — VITAMIN D 25 HYDROXY (VIT D DEFICIENCY, FRACTURES): Vit D, 25-Hydroxy: 41.2 ng/mL (ref 30.0–100.0)

## 2023-08-24 LAB — CORTISOL: Cortisol: 10.6 ug/dL (ref 6.2–19.4)

## 2023-08-29 ENCOUNTER — Telehealth: Payer: Medicare HMO | Admitting: Obstetrics and Gynecology

## 2023-08-29 DIAGNOSIS — N859 Noninflammatory disorder of uterus, unspecified: Secondary | ICD-10-CM | POA: Diagnosis not present

## 2023-08-29 DIAGNOSIS — R9389 Abnormal findings on diagnostic imaging of other specified body structures: Secondary | ICD-10-CM

## 2023-08-29 DIAGNOSIS — N882 Stricture and stenosis of cervix uteri: Secondary | ICD-10-CM

## 2023-08-29 DIAGNOSIS — N958 Other specified menopausal and perimenopausal disorders: Secondary | ICD-10-CM

## 2023-08-29 DIAGNOSIS — N83201 Unspecified ovarian cyst, right side: Secondary | ICD-10-CM | POA: Diagnosis not present

## 2023-08-29 MED ORDER — ESTRADIOL 0.1 MG/GM VA CREA: TOPICAL_CREAM | VAGINAL | 11 refills | Status: AC

## 2023-08-29 NOTE — Progress Notes (Signed)
 TELEHEALTH GYNECOLOGY VISIT ENCOUNTER NOTE  Provider location: [facility] for [facility] [place]   Patient location: Home  I connected with Kellie [name] on 08/29/23 at 10:10 AM EST by MyChart audiovisual encounter.    History:  Kellie [name] is a 68 y.o. menopausal G1P1001 presenting for follow up of CT   Patient seen by GI for LLQ pain and history of diverticulitis - her workup was ultimately c/w constipation with overflow diarrhea. She had a CT A/P 08/12/23 as part of her work up which incidentally noted dilated gonadal veins and pelvic collateral vessels but no pathologically enlarged abdominal/pelvic lymph nodes. Uterus/adenxa were unremarkable. She was instructed to contact GYN to discuss possible pelvic congestion syndrome.   Her only complaint today is pain during pelvic ultrasounds and pelvic exams. She is not currently sexually active and wonders if this will stop her from ever being intimate with someone if the future.     Past Medical History:  Diagnosis Date   Abnormal Pap smear of cervix    Allergic rhinitis 11/08/2010   Anxiety    Asthma    Bronchitis    GERD (gastroesophageal reflux disease)    Herpes simplex of female genitalia    Hiatal hernia    Hyperlipidemia    Mitral valve prolapse    Osteoporosis 11/21/2011   Thyroid nodule    Urticaria    Past Surgical History:  Procedure Laterality Date   CLOSED MANIPULATION SHOULDER  7&01/2005   x2, 30 days apart   CLOSED MANIPULATION SHOULDER  2006   Left   COLONOSCOPY  09/2012   Medium sized external hemorrhoids, few small divertic in left colon, otherwise normal colon (repeat 10 yrs)   ESOPHAGOGASTRODUODENOSCOPY  09/2012   Normal (Dr. Christella Hartigan)   LEEP  11/2004   The following portions of the patient's history were reviewed and updated as appropriate: allergies, current medications, past family history, past medical history, past social history, past surgical history and problem list.   Health  Maintenance:  Normal pap and negative HRHPV 2024  Review of Systems:  Pertinent items noted in HPI and remainder of comprehensive ROS otherwise negative.  Physical Exam:   General:  Alert, oriented and cooperative.   Mental Status: Normal mood and affect perceived. Normal judgment and thought content.  Physical exam deferred due to nature of the encounter  Labs and Imaging Results for orders placed or performed in visit on 08/21/23 (from the past 2 weeks)  VITAMIN D 25 Hydroxy (Vit-D Deficiency, Fractures)   Collection Time: 08/21/23 10:00 AM  Result Value Ref Range   Vit D, 25-Hydroxy 41.2 30.0 - 100.0 ng/mL  Iodine, Serum/Plasma   Collection Time: 08/21/23 10:00 AM  Result Value Ref Range   Iodine 75.7 40.0 - 92.0 ug/L  Hemoglobin A1c   Collection Time: 08/21/23 10:00 AM  Result Value Ref Range   Hgb A1c MFr Bld 5.4 4.8 - 5.6 %   Est. average glucose Bld gHb Est-mCnc 108 mg/dL  Cortisol   Collection Time: 08/21/23 10:00 AM  Result Value Ref Range   Cortisol 10.6 6.2 - 19.4 ug/dL  ZOX09+UEAV   Collection Time: 08/21/23 10:00 AM  Result Value Ref Range   Glucose 80 70 - 99 mg/dL   BUN 16 8 - 27 mg/dL   Creatinine, Ser 4.09 0.57 - 1.00 mg/dL   eGFR 96 >81 XB/JYN/8.29   BUN/Creatinine Ratio 25 12 - 28   Sodium 142 134 - 144 mmol/L   Potassium 4.3  3.5 - 5.2 mmol/L   Chloride 102 96 - 106 mmol/L   CO2 22 20 - 29 mmol/L   Calcium 10.0 8.7 - 10.3 mg/dL   Total Protein 6.9 6.0 - 8.5 g/dL   Albumin 4.7 3.9 - 4.9 g/dL   Globulin, Total 2.2 1.5 - 4.5 g/dL   Bilirubin Total 0.5 0.0 - 1.2 mg/dL   Alkaline Phosphatase 47 44 - 121 IU/L   AST 26 0 - 40 IU/L   ALT 21 0 - 32 IU/L   DG Chest 2 View Result Date: 08/21/2023 CLINICAL DATA:  Wheezing, chest pain. EXAM: CHEST - 2 VIEW COMPARISON:  June 29, 2020. FINDINGS: The heart size and mediastinal contours are within normal limits. Both lungs are clear. The visualized skeletal structures are unremarkable. IMPRESSION: No active  cardiopulmonary disease. Electronically Signed   By: Lupita Raider M.D.   On: 08/21/2023 10:47   CT ABDOMEN PELVIS W CONTRAST Result Date: 08/17/2023 CLINICAL DATA:  Left lower quadrant abdominal pain, history of diverticulitis. EXAM: CT ABDOMEN AND PELVIS WITH CONTRAST TECHNIQUE: Multidetector CT imaging of the abdomen and pelvis was performed using the standard protocol following bolus administration of intravenous contrast. RADIATION DOSE REDUCTION: This exam was performed according to the departmental dose-optimization program which includes automated exposure control, adjustment of the mA and/or kV according to patient size and/or use of iterative reconstruction technique. CONTRAST:  80mL OMNIPAQUE IOHEXOL 300 MG/ML  SOLN COMPARISON:  CT May 06, 2023 FINDINGS: Lower chest: Bibasilar atelectasis/scarring. Symmetric wall thickening of the gastroesophageal junction. Hepatobiliary: Tiny hypodense hepatic lesions are technically too small to accurately characterize but statistically likely benign. Gallbladder is unremarkable. No biliary ductal dilation. Pancreas: No pancreatic ductal dilation or evidence of acute inflammation. Spleen: No splenomegaly. Adrenals/Urinary Tract: Bilateral adrenal glands appear normal. No hydronephrosis. Kidneys demonstrate symmetric enhancement. Urinary bladder is unremarkable for degree of distension. Stomach/Bowel: Radiopaque enteric contrast material traverses the ascending colon. Stomach is unremarkable for degree of distension. No pathologic dilation of small or large bowel. Moderate volume of formed stool throughout the colon. Sigmoid colonic diverticulosis without findings of acute diverticulitis. Vascular/Lymphatic: Dilated gonadal veins and pelvic collateral vessels. Aortic atherosclerosis. The portal, splenic and superior mesenteric veins are patent. No pathologically enlarged abdominal or pelvic lymph nodes. Reproductive: Uterus and bilateral adnexa are  unremarkable. Other: No significant abdominopelvic free fluid. Musculoskeletal: L4-L5 discogenic disease. IMPRESSION: 1. No acute abnormality in the abdomen or pelvis. 2. Sigmoid colonic diverticulosis without findings of acute diverticulitis. 3. Moderate volume of formed stool throughout the colon. Correlate for constipation. 4. Dilated gonadal veins and pelvic collateral vessels, which can be seen in the setting of pelvic congestion syndrome. 5. Symmetric wall thickening of the gastroesophageal junction, which may reflect esophagitis. Consider further evaluation with endoscopy. 6. Aortic atherosclerosis. Aortic Atherosclerosis (ICD10-I70.0). Electronically Signed   By: Maudry Mayhew M.D.   On: 08/17/2023 08:59      Assessment and Plan:  67yo with the following:  Abnormal CT scan Reviewed the diagnosis of pelvic congestion syndrome is not well defined but it is typically seen in reproductive-age women and is characterized by pelvic pressure, dyspareunia and dysuria. PCS has not been reported in menopausal women and is unlikely to be the etiology of her CT findings. No mass, no abnormality of the uterus/adenxa, and no abnormal LAD was visualized on CT so would not recommend specific follow up of these findings.  Genitourinary syndrome of menopause Discussed role and r/b of vaginal estrogen and pelvic floor physical therapy  for reduction in pain related to pelvic exams/pelvic ultrasounds and potentially improve sexual function.  She would like to try vaginal estrogen.  -     estradiol (ESTRACE VAGINAL) 0.1 MG/GM vaginal cream; Apply a pea sized amount to the inside/opening of the vagina nightly for two weeks, then two times per week  Cyst of right ovary 1.5cm bilocuated on [place]  No additional imaging recommended by radiology. ACR at most would recommend [place] in 12 months Can assess for stability with [place] in September at the earliest  Fluid in endometrial cavity Cervical stenosis Stable over several  ultrasounds No vaginal bleeding Reviewed diagnosis and plan of care from last visit   I discussed the assessment and treatment plan with the patient. The patient was provided an opportunity to ask questions and all were answered. The patient agreed with the plan and demonstrated an understanding of the instructions.   The patient was advised to call back or seek an in-person evaluation/go to the ED if the symptoms worsen or if the condition fails to improve as anticipated.  I provided 38 minutes of non-face-to-face time during this encounter.  Follow up 02/2024 for annual exam  Lennart Pall, MD [facility] for [facility], [facility]

## 2023-09-02 DIAGNOSIS — E782 Mixed hyperlipidemia: Secondary | ICD-10-CM | POA: Diagnosis not present

## 2023-09-03 ENCOUNTER — Encounter: Payer: Self-pay | Admitting: Physician Assistant

## 2023-09-03 LAB — LIPID PANEL W/REFLEX DIRECT LDL
Cholesterol: 270 mg/dL — ABNORMAL HIGH (ref <200)
HDL: 109 mg/dL (ref >=50)
LDL Cholesterol (Calc): 149 mg/dL — ABNORMAL HIGH
Non-HDL Cholesterol (Calc): 161 mg/dL — ABNORMAL HIGH (ref <130)
Total CHOL/HDL Ratio: 2.5 (calc) (ref <5.0)
Triglycerides: 38 mg/dL (ref <150)

## 2023-09-03 NOTE — Progress Notes (Signed)
 Kellie [name],   Your HDL, good cholesterol, improved a lot!! GREAT news.  TG are low.  Stable LDL.

## 2023-09-07 ENCOUNTER — Encounter: Payer: Self-pay | Admitting: Physician Assistant

## 2023-09-07 DIAGNOSIS — M5136 Other intervertebral disc degeneration, lumbar region with discogenic back pain only: Secondary | ICD-10-CM

## 2023-09-09 MED ORDER — TRAMADOL HCL 50 MG PO TABS: 50.0000 mg | ORAL_TABLET | Freq: Four times a day (QID) | ORAL | 0 refills | Status: DC | PRN

## 2023-09-09 NOTE — Telephone Encounter (Signed)
 Requesting rx rf of tramadol did not see this medication in past or current med list.

## 2023-09-16 ENCOUNTER — Encounter: Payer: Self-pay | Admitting: Physician Assistant

## 2023-09-16 DIAGNOSIS — M436 Torticollis: Secondary | ICD-10-CM | POA: Diagnosis not present

## 2023-09-16 DIAGNOSIS — Z681 Body mass index (BMI) 19 or less, adult: Secondary | ICD-10-CM | POA: Diagnosis not present

## 2023-09-18 ENCOUNTER — Encounter: Payer: Self-pay | Admitting: Physician Assistant

## 2023-09-18 NOTE — Telephone Encounter (Signed)
 See other MyChart message

## 2023-09-19 ENCOUNTER — Other Ambulatory Visit (HOSPITAL_COMMUNITY): Payer: Self-pay | Admitting: Psychiatry

## 2023-09-19 DIAGNOSIS — M5412 Radiculopathy, cervical region: Secondary | ICD-10-CM

## 2023-09-19 DIAGNOSIS — M5030 Other cervical disc degeneration, unspecified cervical region: Secondary | ICD-10-CM

## 2023-09-20 ENCOUNTER — Encounter: Payer: Self-pay | Admitting: Sports Medicine

## 2023-09-20 ENCOUNTER — Ambulatory Visit (INDEPENDENT_AMBULATORY_CARE_PROVIDER_SITE_OTHER): Admitting: Sports Medicine

## 2023-09-20 ENCOUNTER — Ambulatory Visit

## 2023-09-20 DIAGNOSIS — M5030 Other cervical disc degeneration, unspecified cervical region: Secondary | ICD-10-CM

## 2023-09-20 DIAGNOSIS — M542 Cervicalgia: Secondary | ICD-10-CM

## 2023-09-20 DIAGNOSIS — M4802 Spinal stenosis, cervical region: Secondary | ICD-10-CM | POA: Diagnosis not present

## 2023-09-20 DIAGNOSIS — M5412 Radiculopathy, cervical region: Secondary | ICD-10-CM | POA: Diagnosis not present

## 2023-09-20 DIAGNOSIS — M47812 Spondylosis without myelopathy or radiculopathy, cervical region: Secondary | ICD-10-CM | POA: Diagnosis not present

## 2023-09-20 DIAGNOSIS — M4312 Spondylolisthesis, cervical region: Secondary | ICD-10-CM | POA: Diagnosis not present

## 2023-09-20 NOTE — Assessment & Plan Note (Signed)
 This is a very pleasant 68 year old female, she has some increasing neck pain. We talked about this back in 2019, she had an MRI in 2021. MRI did show C3-C4 mild cervical spinal stenosis and DDD at C5-C6. Her pain has improved considerably, nothing radicular, exam is normal. We discussed the anatomy and pathophysiology of cervical disc disease, she understands the need for aggressive physical modalities such as home therapy, I have given her the handout. X-rays today. She would like to avoid medication and I respect this. Return to see me in 6 weeks as needed.

## 2023-09-20 NOTE — Progress Notes (Signed)
    Procedures performed today:    None.  Independent interpretation of notes and tests performed by another provider:   None.  Brief History, Exam, Impression, and Recommendations:    DDD (degenerative disc disease), cervical This is a very pleasant 68 year old female, she has some increasing neck pain. We talked about this back in 2019, she had an MRI in 2021. MRI did show C3-C4 mild cervical spinal stenosis and DDD at C5-C6. Her pain has improved considerably, nothing radicular, exam is normal. We discussed the anatomy and pathophysiology of cervical disc disease, she understands the need for aggressive physical modalities such as home therapy, I have given her the handout. X-rays today. She would like to avoid medication and I respect this. Return to see me in 6 weeks as needed.    ____________________________________________ Ihor Austin. Benjamin Stain, M.D., ABFM., CAQSM., AME. Primary Care and Sports Medicine [facility]  Adjunct Professor of Family Medicine  [employer]  Restaurant manager, fast food

## 2023-09-24 ENCOUNTER — Telehealth (HOSPITAL_COMMUNITY): Payer: Self-pay | Admitting: *Deleted

## 2023-09-24 DIAGNOSIS — F419 Anxiety disorder, unspecified: Secondary | ICD-10-CM

## 2023-09-24 MED ORDER — CITALOPRAM HYDROBROMIDE 10 MG PO TABS: 10.0000 mg | ORAL_TABLET | Freq: Every day | ORAL | 0 refills | Status: DC

## 2023-09-24 NOTE — Telephone Encounter (Signed)
 Rx REQUEST [facility] [place], [place] - [address]    citalopram (CELEXA) 10 MG tablet   NEXT APPT  09/30/23 LAST APPT   04/01/23

## 2023-09-24 NOTE — Addendum Note (Signed)
 Addended by: Thresa Ross on: 09/24/2023 04:20 PM   Modules accepted: Orders

## 2023-09-30 ENCOUNTER — Telehealth (INDEPENDENT_AMBULATORY_CARE_PROVIDER_SITE_OTHER): Payer: Medicare HMO | Admitting: Psychiatry

## 2023-09-30 ENCOUNTER — Encounter (HOSPITAL_COMMUNITY): Payer: Self-pay | Admitting: Psychiatry

## 2023-09-30 DIAGNOSIS — M5412 Radiculopathy, cervical region: Secondary | ICD-10-CM

## 2023-09-30 DIAGNOSIS — G4700 Insomnia, unspecified: Secondary | ICD-10-CM

## 2023-09-30 DIAGNOSIS — F3181 Bipolar II disorder: Secondary | ICD-10-CM

## 2023-09-30 DIAGNOSIS — F411 Generalized anxiety disorder: Secondary | ICD-10-CM

## 2023-09-30 DIAGNOSIS — F5102 Adjustment insomnia: Secondary | ICD-10-CM

## 2023-09-30 DIAGNOSIS — F419 Anxiety disorder, unspecified: Secondary | ICD-10-CM

## 2023-09-30 DIAGNOSIS — M5030 Other cervical disc degeneration, unspecified cervical region: Secondary | ICD-10-CM

## 2023-09-30 DIAGNOSIS — F319 Bipolar disorder, unspecified: Secondary | ICD-10-CM

## 2023-09-30 MED ORDER — CITALOPRAM HYDROBROMIDE 10 MG PO TABS: 10.0000 mg | ORAL_TABLET | Freq: Every day | ORAL | 0 refills | Status: DC

## 2023-09-30 NOTE — Progress Notes (Signed)
 Patient ID: Kellie [name], female   DOB: July 02, 1955, 68 y.o.   MRN: [id number]   [facility] Follow up Outpatient visit  Kellie [name] [id number] 68 y.o.  09/30/2023 10:02 AM  Virtual Visit via Video Note  I connected with Kellie [name] on 09/30/23 at 10:00 AM EDT by a video enabled telemedicine application and verified that I am speaking with the correct person using two identifiers.  Location: Patient: home Provider: home office   I discussed the limitations of evaluation and management by telemedicine and the availability of in person appointments. The patient expressed understanding and agreed to proceed.        I discussed the assessment and treatment plan with the patient. The patient was provided an opportunity to ask questions and all were answered. The patient agreed with the plan and demonstrated an understanding of the instructions.   The patient was advised to call back or seek an in-person evaluation if the symptoms worsen or if the condition fails to improve as anticipated.  I provided 15 minutes of non-face-to-face time during this encounter.    Chief Complant:    History of Present Illness:   Doing fair, had cervical radiculopathy so following with providers  Depression manageable , meds help with anxiety Is on gaba and celexa  Gets stressed driving     Aggravating factor: driving, regular stressors, crowds Modifying factor: family,  church Duration  8 years plus Severity manageable,  No chest pain  Past Psychiatric History/Hospitalization(s) denies  Hospitalization for psychiatric illness: No History of Electroconvulsive Shock Therapy: No Prior Suicide Attempts: No  Medical History; Past Medical History:  Diagnosis Date   Abnormal Pap smear of cervix    Allergic rhinitis 11/08/2010   Anxiety    Asthma    Bronchitis    GERD (gastroesophageal reflux disease)    Herpes simplex of female genitalia    Hiatal hernia     Hyperlipidemia    Mitral valve prolapse    Osteoporosis 11/21/2011   Thyroid nodule    Urticaria     Allergies: Allergies  Allergen Reactions   Codeine Nausea And Vomiting   Hydrocodone Nausea And Vomiting   Morphine Nausea And Vomiting   Abilify [Aripiprazole]     Nausea/vomiting/weakness/shaky   Morphine And Codeine Nausea And Vomiting   Percocet [Oxycodone-Acetaminophen] Nausea And Vomiting and Other (See Comments)    Patient states it makes blood pressure drop too much   Vicodin [Hydrocodone-Acetaminophen] Nausea And Vomiting    Medications: Outpatient Encounter Medications as of 09/30/2023  Medication Sig   acyclovir (ZOVIRAX) 200 MG capsule Take 2 capsules (400 mg total) by mouth 2 (two) times daily.   ALPRAZolam (XANAX) 0.5 MG tablet Take 1 tablet (0.5 mg total) by mouth daily as needed. for anxiety   AMBULATORY NON FORMULARY MEDICATION Incentive spirometer use as directed - fax to [facility] [phone]   ascorbic acid (VITAMIN C) 100 MG tablet Take 1 tablet by mouth daily.   cetirizine (ZYRTEC) 10 MG tablet Take by mouth.   Cholecalciferol (VITAMIN D) 2000 units CAPS Take by mouth.   citalopram (CELEXA) 10 MG tablet Take 1 tablet (10 mg total) by mouth daily.   estradiol (ESTRACE VAGINAL) 0.1 MG/GM vaginal cream Apply a pea sized amount to the inside/opening of the vagina nightly for two weeks, then two times per week   famotidine (PEPCID) 20 MG tablet Take 1 tablet (20 mg total) by mouth 2 (two) times daily.   gabapentin (  NEURONTIN) 100 MG capsule TAKE ONE CAPSULE BY MOUTH THREE TIMES DAILY   hydrocortisone (ANUSOL-HC) 2.5 % rectal cream Place 1 Application rectally 2 (two) times daily as needed for hemorrhoids or anal itching.   Menaquinone-7 (VITAMIN K2) 100 MCG CAPS Take by mouth.   pantoprazole (PROTONIX) 40 MG tablet TAKE (1) TABLET BY MOUTH TWICE DAILY.   Probiotic Product (PROBIOTIC & ACIDOPHILUS EX ST PO) Take by mouth.    [DISCONTINUED]  citalopram (CELEXA) 10 MG tablet Take 1 tablet (10 mg total) by mouth daily.   No facility-administered encounter medications on file as of 09/30/2023.     Family History; Family History  Problem Relation Age of Onset   Kidney cancer Mother 2   COPD Mother    Kidney disease Mother    Allergic rhinitis Mother    Asthma Mother    Heart disease Father    Allergic rhinitis Father    COPD Sister    Depression Sister    Pancreatic cancer Sister    Hypothyroidism Sister    Hypothyroidism Sister    Stroke Sister    Heart disease Brother    Heart attack Brother    Depression Maternal Aunt    Heart disease Paternal Uncle    Depression Maternal Grandfather    Diabetes Other        neice   Thyroid disease Other        neice   Colon cancer Neg Hx    Rectal cancer Neg Hx    Stomach cancer Neg Hx    Colon polyps Neg Hx    Esophageal cancer Neg Hx        Labs:  Recent Results (from the past 2160 hours)  VITAMIN D 25 Hydroxy (Vit-D Deficiency, Fractures)     Status: None   Collection Time: 08/21/23 10:00 AM  Result Value Ref Range   Vit D, 25-Hydroxy 41.2 30.0 - 100.0 ng/mL    Comment: Vitamin D deficiency has been defined by the Institute of Medicine and an Endocrine Society practice guideline as a level of serum 25-OH vitamin D less than 20 ng/mL (1,2). The Endocrine Society went on to further define vitamin D insufficiency as a level between 21 and 29 ng/mL (2). 1. IOM (Institute of Medicine). 2010. Dietary reference    intakes for calcium and D. [place]: The    [employer]. 2. Holick MF, Binkley Lavonia, Bischoff-Ferrari HA, et al.    Evaluation, treatment, and prevention of vitamin D    deficiency: an Endocrine Society clinical practice    guideline. JCEM. 2011 Jul; 96(7):1911-30.   Iodine, Serum/Plasma     Status: None   Collection Time: 08/21/23 10:00 AM  Result Value Ref Range   Iodine 75.7 40.0 - 92.0 ug/L    Comment:                                           Limit of quantitation = 20 **Effective September 02, 2023, the reference interval for Iodine, Serum**   will be changing to:                              Age              Female         Female  0 - 1 year       38.6 - 91.9   38.6 - 91.9                           2 - 5 years      37.1 - 66.4   37.1 - 66.4                           6 - 70 years     31.1 - 64.6   31.1 - 64.6                           71 - 110 years   32.2 - 67.8   33.5 - 71.9   Hemoglobin A1c     Status: None   Collection Time: 08/21/23 10:00 AM  Result Value Ref Range   Hgb A1c MFr Bld 5.4 4.8 - 5.6 %    Comment:          Prediabetes: 5.7 - 6.4          Diabetes: >6.4          Glycemic control for adults with diabetes: <7.0    Est. average glucose Bld gHb Est-mCnc 108 mg/dL  Cortisol     Status: None   Collection Time: 08/21/23 10:00 AM  Result Value Ref Range   Cortisol 10.6 6.2 - 19.4 ug/dL    Comment: Please Note: The reference interval and flagging for  this test is for an AM collection. If this is a PM  collection please use:         Cortisol PM: 2.3-11.9   CMP14+EGFR     Status: None   Collection Time: 08/21/23 10:00 AM  Result Value Ref Range   Glucose 80 70 - 99 mg/dL   BUN 16 8 - 27 mg/dL   Creatinine, Ser 2.95 0.57 - 1.00 mg/dL   eGFR 96 >62 ZH/YQM/5.78   BUN/Creatinine Ratio 25 12 - 28   Sodium 142 134 - 144 mmol/L   Potassium 4.3 3.5 - 5.2 mmol/L   Chloride 102 96 - 106 mmol/L   CO2 22 20 - 29 mmol/L   Calcium 10.0 8.7 - 10.3 mg/dL   Total Protein 6.9 6.0 - 8.5 g/dL   Albumin 4.7 3.9 - 4.9 g/dL   Globulin, Total 2.2 1.5 - 4.5 g/dL   Bilirubin Total 0.5 0.0 - 1.2 mg/dL   Alkaline Phosphatase 47 44 - 121 IU/L   AST 26 0 - 40 IU/L   ALT 21 0 - 32 IU/L  Lipid Panel w/reflex Direct LDL     Status: Abnormal   Collection Time: 09/02/23  8:02 AM  Result Value Ref Range   Cholesterol 270 (H) <200 mg/dL   HDL 469 > OR = 50 mg/dL   Triglycerides 38 <629 mg/dL   LDL  Cholesterol (Calc) 149 (H) mg/dL (calc)    Comment: Reference range: <100 . Desirable range <100 mg/dL for primary prevention;   <70 mg/dL for patients with CHD or diabetic patients  with > or = 2 CHD risk factors. [name] Kitchen LDL-C is now calculated using the Martin-Hopkins  calculation, which is a validated novel method providing  better accuracy than the Friedewald equation in the  estimation of LDL-C.  Horald Pollen et al. Lenox Ahr. 5284;132(44): 2061-2068  (http://education.[employer].com/faq/FAQ164)    Total CHOL/HDL Ratio 2.5 <5.0 (calc)  Non-HDL Cholesterol (Calc) 161 (H) <130 mg/dL (calc)    Comment: For patients with diabetes plus 1 major ASCVD risk  factor, treating to a non-HDL-C goal of <100 mg/dL  (LDL-C of <82 mg/dL) is considered a therapeutic  option.        Mental Status Examination;   Psychiatric Specialty Exam: Physical Exam  Review of Systems  Cardiovascular:  Negative for chest pain.  Psychiatric/Behavioral:  Negative for depression and suicidal ideas.     There were no vitals taken for this visit.There is no height or weight on file to calculate BMI.  General Appearance: casual  Eye Contact::  fair  Speech:  coherent  Volume:  Normal  Mood: fair  Affect:   Thought Process: clear . No psychosis  Orientation:  Full (Time, Place, and Person)  Thought Content:  Rumination  Suicidal Thoughts:  No  Homicidal Thoughts:  No  Memory:  Immediate;   Fair Recent;   Fair  Judgement:  Fair  Insight:  Shallow  Psychomotor Activity:  Increased  Concentration:  Fair  Recall:  Fair  Akathisia:  Negative  Handed:  Right  AIMS (if indicated):     Assets:  Desire for Improvement Physical Health Transportation  Sleep:        Assessment: Axis I: bipolar disorder II unspecified or depressed type. Anxiety disorder NOS. Insomnia  Axis II: deferred  Axis III:  Past Medical History:  Diagnosis Date   Abnormal Pap smear of cervix    Allergic rhinitis 11/08/2010    Anxiety    Asthma    Bronchitis    GERD (gastroesophageal reflux disease)    Herpes simplex of female genitalia    Hiatal hernia    Hyperlipidemia    Mitral valve prolapse    Osteoporosis 11/21/2011   Thyroid nodule    Urticaria     Axis IV: psychosocial   Treatment Plan and Summary:  Prior documentation reviewed   Bipolar : baseline, continue gabapanetin, refills were sent last week, she will check   GAD:  manageable conitnue celexa, reviewed current stressors  Insomnia: manageable continue sleep hygiene and xanax prn  Refills due were sent or already sent  FU 6 m. Or earlier if needed  Thresa Ross, MD 09/30/2023

## 2023-10-16 DIAGNOSIS — H11153 Pinguecula, bilateral: Secondary | ICD-10-CM | POA: Diagnosis not present

## 2023-10-16 DIAGNOSIS — H52203 Unspecified astigmatism, bilateral: Secondary | ICD-10-CM | POA: Diagnosis not present

## 2023-10-16 DIAGNOSIS — H2513 Age-related nuclear cataract, bilateral: Secondary | ICD-10-CM | POA: Diagnosis not present

## 2023-10-16 DIAGNOSIS — H04123 Dry eye syndrome of bilateral lacrimal glands: Secondary | ICD-10-CM | POA: Diagnosis not present

## 2023-10-16 DIAGNOSIS — H25013 Cortical age-related cataract, bilateral: Secondary | ICD-10-CM | POA: Diagnosis not present

## 2023-10-16 DIAGNOSIS — H524 Presbyopia: Secondary | ICD-10-CM | POA: Diagnosis not present

## 2023-10-16 DIAGNOSIS — R7303 Prediabetes: Secondary | ICD-10-CM | POA: Diagnosis not present

## 2023-10-16 DIAGNOSIS — H02831 Dermatochalasis of right upper eyelid: Secondary | ICD-10-CM | POA: Diagnosis not present

## 2023-10-16 DIAGNOSIS — H5203 Hypermetropia, bilateral: Secondary | ICD-10-CM | POA: Diagnosis not present

## 2023-10-25 ENCOUNTER — Other Ambulatory Visit (HOSPITAL_COMMUNITY): Payer: Self-pay | Admitting: Psychiatry

## 2023-10-25 DIAGNOSIS — F419 Anxiety disorder, unspecified: Secondary | ICD-10-CM

## 2023-11-01 ENCOUNTER — Encounter (HOSPITAL_COMMUNITY): Payer: Self-pay

## 2023-11-04 ENCOUNTER — Telehealth: Payer: Self-pay

## 2023-11-04 NOTE — Telephone Encounter (Signed)
 Excellent, no changes needed then

## 2023-11-04 NOTE — Telephone Encounter (Signed)
 Copied from CRM [id number]. Topic: Appointments - Appointment Cancel/Reschedule >> Nov 04, 2023  9:21 AM Kellie [name] H wrote: Patient states that she is doing good and does not feel she needs this appointment and she wants to cancel, if she needs to come in she said you can call and let her know but other than that she is okay.

## 2023-11-04 NOTE — Telephone Encounter (Signed)
 Looks like the appointment that was cancelled was for a 6 week follow up visit.  Patient stating that she cancelled because she did not think she needed it -

## 2023-11-08 ENCOUNTER — Ambulatory Visit: Admitting: Sports Medicine

## 2023-11-14 ENCOUNTER — Other Ambulatory Visit (HOSPITAL_COMMUNITY): Payer: Self-pay | Admitting: Psychiatry

## 2023-11-14 DIAGNOSIS — M5412 Radiculopathy, cervical region: Secondary | ICD-10-CM

## 2023-11-14 DIAGNOSIS — M5030 Other cervical disc degeneration, unspecified cervical region: Secondary | ICD-10-CM

## 2023-11-15 ENCOUNTER — Other Ambulatory Visit: Payer: Self-pay | Admitting: Physician Assistant

## 2023-11-15 NOTE — Telephone Encounter (Signed)
Rx not listed in active med list.  

## 2023-11-26 ENCOUNTER — Telehealth (HOSPITAL_COMMUNITY): Payer: Self-pay | Admitting: *Deleted

## 2023-11-26 DIAGNOSIS — F419 Anxiety disorder, unspecified: Secondary | ICD-10-CM

## 2023-11-26 MED ORDER — CITALOPRAM HYDROBROMIDE 10 MG PO TABS
10.0000 mg | ORAL_TABLET | Freq: Every day | ORAL | 1 refills | Status: DC
Start: 2023-11-26 — End: 2024-01-16

## 2023-11-26 NOTE — Addendum Note (Signed)
 Addended by: Lucas Exline on: 11/26/2023 01:57 PM   Modules accepted: Orders

## 2023-11-26 NOTE — Telephone Encounter (Signed)
 Rx REQUEST citalopram  (CELEXA ) 10 MG tablet   [facility] [place], [place]   NEXT APPT  03/30/24 LAST APPT   09/30/23

## 2023-12-23 DIAGNOSIS — H524 Presbyopia: Secondary | ICD-10-CM | POA: Diagnosis not present

## 2023-12-25 DIAGNOSIS — Z1330 Encounter for screening examination for mental health and behavioral disorders, unspecified: Secondary | ICD-10-CM | POA: Diagnosis not present

## 2023-12-25 DIAGNOSIS — R634 Abnormal weight loss: Secondary | ICD-10-CM | POA: Diagnosis not present

## 2023-12-26 DIAGNOSIS — R634 Abnormal weight loss: Secondary | ICD-10-CM | POA: Diagnosis not present

## 2024-01-16 ENCOUNTER — Other Ambulatory Visit (HOSPITAL_COMMUNITY): Payer: Self-pay | Admitting: Psychiatry

## 2024-01-16 DIAGNOSIS — M5412 Radiculopathy, cervical region: Secondary | ICD-10-CM

## 2024-01-16 DIAGNOSIS — M5030 Other cervical disc degeneration, unspecified cervical region: Secondary | ICD-10-CM

## 2024-01-16 DIAGNOSIS — F419 Anxiety disorder, unspecified: Secondary | ICD-10-CM

## 2024-01-29 ENCOUNTER — Encounter: Admitting: Physician Assistant

## 2024-02-05 ENCOUNTER — Ambulatory Visit: Payer: Self-pay

## 2024-02-05 NOTE — Telephone Encounter (Signed)
 FYI Only or Action Required?: FYI only for provider.  Patient was last seen in primary care on 09/20/2023 by Curtis Debby PARAS, MD.  Called Nurse Triage reporting Appointment Scheduling.   Triage Disposition: See PCP Within 2 Weeks  Patient/caregiver understands and will follow disposition?: Yes    **Patient only calling to be scheduled for physical; no triage needed, she was scheduled for 8/25**                  Copied from CRM #[id number]. Topic: Clinical - Red Word Triage >> Feb 05, 2024 11:29 AM Mercer PEDLAR wrote: Red Word that prompted transfer to Nurse Triage: Patient is having chest tightness and fatigue. Reason for Disposition  Requesting regular office appointment  Answer Assessment - Initial Assessment Questions 1. LOCATION: Where does it hurt?         2. RADIATION: Does the pain go anywhere else? (e.g., into neck, jaw, arms, back)       3. ONSET: When did the chest pain begin? (Minutes, hours or days)        4. PATTERN: Does the pain come and go, or has it been constant since it started?  Does it get worse with exertion?        5. DURATION: How long does it last (e.g., seconds, minutes, hours)       6. SEVERITY: How bad is the pain?  (e.g., Scale 1-10; mild, moderate, or severe)       7. CARDIAC RISK FACTORS: Do you have any history of heart problems or risk factors for heart disease? (e.g., angina, prior heart attack; diabetes, high blood pressure, high cholesterol, smoker, or strong family history of heart disease)       8. PULMONARY RISK FACTORS: Do you have any history of lung disease?  (e.g., blood clots in lung, asthma, emphysema, birth control pills)       9. CAUSE: What do you think is causing the chest pain?       10. OTHER SYMPTOMS: Do you have any other symptoms? (e.g., dizziness, nausea, vomiting, sweating, fever, difficulty breathing, cough)  Answer Assessment - Initial Assessment Questions 1. REASON FOR  CALL: What is the main reason for your call? or How can I best help you?   Patient stated she did not need nurse triage, and was only calling to schedule her physical. Patient was scheduled for 8/25  Protocols used: Chest Pain-A-AH, Information Only Call - No Triage-A-AH

## 2024-02-16 ENCOUNTER — Other Ambulatory Visit: Payer: Self-pay | Admitting: Physician Assistant

## 2024-02-16 DIAGNOSIS — F419 Anxiety disorder, unspecified: Secondary | ICD-10-CM

## 2024-02-16 DIAGNOSIS — R4589 Other symptoms and signs involving emotional state: Secondary | ICD-10-CM

## 2024-02-17 ENCOUNTER — Ambulatory Visit (INDEPENDENT_AMBULATORY_CARE_PROVIDER_SITE_OTHER): Admitting: Physician Assistant

## 2024-02-17 ENCOUNTER — Encounter: Payer: Self-pay | Admitting: Physician Assistant

## 2024-02-17 VITALS — BP 112/68 | HR 74 | Ht 66.0 in | Wt 110.0 lb

## 2024-02-17 DIAGNOSIS — R7303 Prediabetes: Secondary | ICD-10-CM

## 2024-02-17 DIAGNOSIS — Z1211 Encounter for screening for malignant neoplasm of colon: Secondary | ICD-10-CM

## 2024-02-17 DIAGNOSIS — R7989 Other specified abnormal findings of blood chemistry: Secondary | ICD-10-CM | POA: Diagnosis not present

## 2024-02-17 DIAGNOSIS — Z8719 Personal history of other diseases of the digestive system: Secondary | ICD-10-CM

## 2024-02-17 DIAGNOSIS — J358 Other chronic diseases of tonsils and adenoids: Secondary | ICD-10-CM | POA: Diagnosis not present

## 2024-02-17 DIAGNOSIS — Z0000 Encounter for general adult medical examination without abnormal findings: Secondary | ICD-10-CM

## 2024-02-17 DIAGNOSIS — Z13220 Encounter for screening for lipoid disorders: Secondary | ICD-10-CM | POA: Diagnosis not present

## 2024-02-17 NOTE — Patient Instructions (Addendum)
 Will refer back to [facility] for colonoscopy   Health Maintenance After Age 69 After age 67, you are at a higher risk for certain long-term diseases and infections as well as injuries from falls. Falls are a major cause of broken bones and head injuries in people who are older than age 25. Getting regular preventive care can help to keep you healthy and well. Preventive care includes getting regular testing and making lifestyle changes as recommended by your health care provider. Talk with your health care provider about: Which screenings and tests you should have. A screening is a test that checks for a disease when you have no symptoms. A diet and exercise plan that is right for you. What should I know about screenings and tests to prevent falls? Screening and testing are the best ways to find a health problem early. Early diagnosis and treatment give you the best chance of managing medical conditions that are common after age 55. Certain conditions and lifestyle choices may make you more likely to have a fall. Your health care provider may recommend: Regular vision checks. Poor vision and conditions such as cataracts can make you more likely to have a fall. If you wear glasses, make sure to get your prescription updated if your vision changes. Medicine review. Work with your health care provider to regularly review all of the medicines you are taking, including over-the-counter medicines. Ask your health care provider about any side effects that may make you more likely to have a fall. Tell your health care provider if any medicines that you take make you feel dizzy or sleepy. Strength and balance checks. Your health care provider may recommend certain tests to check your strength and balance while standing, walking, or changing positions. Foot health exam. Foot pain and numbness, as well as not wearing proper footwear, can make you more likely to have a fall. Screenings,  including: Osteoporosis screening. Osteoporosis is a condition that causes the bones to get weaker and break more easily. Blood pressure screening. Blood pressure changes and medicines to control blood pressure can make you feel dizzy. Depression screening. You may be more likely to have a fall if you have a fear of falling, feel depressed, or feel unable to do activities that you used to do. Alcohol use screening. Using too much alcohol can affect your balance and may make you more likely to have a fall. Follow these instructions at home: Lifestyle Do not drink alcohol if: Your health care provider tells you not to drink. If you drink alcohol: Limit how much you have to: 0-1 drink a day for women. 0-2 drinks a day for men. Know how much alcohol is in your drink. In the [place] one drink equals one 12 oz bottle of beer (355 mL), one 5 oz glass of wine (148 mL), or one 1 oz glass of hard liquor (44 mL). Do not use any products that contain nicotine or tobacco. These products include cigarettes, chewing tobacco, and vaping devices, such as e-cigarettes. If you need help quitting, ask your health care provider. Activity  Follow a regular exercise program to stay fit. This will help you maintain your balance. Ask your health care provider what types of exercise are appropriate for you. If you need a cane or walker, use it as recommended by your health care provider. Wear supportive shoes that have nonskid soles. Safety  Remove any tripping hazards, such as rugs, cords, and clutter. Install safety equipment such as grab bars  in bathrooms and safety rails on stairs. Keep rooms and walkways well-lit. General instructions Talk with your health care provider about your risks for falling. Tell your health care provider if: You fall. Be sure to tell your health care provider about all falls, even ones that seem minor. You feel dizzy, tiredness (fatigue), or off-balance. Take over-the-counter and  prescription medicines only as told by your health care provider. These include supplements. Eat a healthy diet and maintain a healthy weight. A healthy diet includes low-fat dairy products, low-fat (lean) meats, and fiber from whole grains, beans, and lots of fruits and vegetables. Stay current with your vaccines. Schedule regular health, dental, and eye exams. Summary Having a healthy lifestyle and getting preventive care can help to protect your health and wellness after age 59. Screening and testing are the best way to find a health problem early and help you avoid having a fall. Early diagnosis and treatment give you the best chance for managing medical conditions that are more common for people who are older than age 62. Falls are a major cause of broken bones and head injuries in people who are older than age 22. Take precautions to prevent a fall at home. Work with your health care provider to learn what changes you can make to improve your health and wellness and to prevent falls. This information is not intended to replace advice given to you by your health care provider. Make sure you discuss any questions you have with your health care provider. Document Revised: 10/31/2020 Document Reviewed: 10/31/2020 Elsevier Patient Education  2024 [employer].

## 2024-02-17 NOTE — Progress Notes (Unsigned)
 Complete physical exam  Patient: Kellie [name]   DOB: [birth year]   68 y.o. Female  MRN: [id number]  Subjective:    No chief complaint on file.   Kellie [name] is a 68 y.o. female who presents today for a complete physical exam. She reports consuming a {diet types:17450} diet. {types:19826} She generally feels {DESC; WELL/FAIRLY WELL/POORLY:18703}. She reports sleeping {DESC; WELL/FAIRLY WELL/POORLY:18703}. She {does/does not:200015} have additional problems to discuss today.    Most recent fall risk assessment:    08/13/2023    3:14 PM  Fall Risk   Falls in the past year? 0  Number falls in past yr: 0  Injury with Fall? 0  Risk for fall due to : No Fall Risks  Follow up Falls evaluation completed     Most recent depression screenings:    08/13/2023    3:12 PM 08/03/2022    8:53 AM  PHQ 2/9 Scores  PHQ - 2 Score 0 0    {VISON DENTAL STD PSA (Optional):27386}  {History (Optional):23778}  Patient Care Team: Antoniette Vermell LITTIE DEVONNA as PCP - General (Family Medicine) Mansouraty, Aloha Raddle., MD as Consulting Physician (Gastroenterology) Geralene Kaiser, MD as Consulting Physician (Psychiatry) Pietro, Redell RAMAN, MD as Consulting Physician (Cardiology) Eappen, Saramma, MD as Consulting Physician (Psychiatry)   Outpatient Medications Prior to Visit  Medication Sig   acyclovir  (ZOVIRAX ) 200 MG capsule Take 2 capsules (400 mg total) by mouth 2 (two) times daily.   ALPRAZolam  (XANAX ) 0.5 MG tablet Take 1 tablet (0.5 mg total) by mouth daily as needed. for anxiety   AMBULATORY NON FORMULARY MEDICATION Incentive spirometer use as directed - fax to [facility] [phone]   ascorbic acid (VITAMIN C) 100 MG tablet Take 1 tablet by mouth daily.   cetirizine (ZYRTEC) 10 MG tablet Take by mouth.   Cholecalciferol (VITAMIN D ) 2000 units CAPS Take by mouth.   citalopram  (CELEXA ) 10 MG tablet TAKE ONE TABLET BY MOUTH EVERY DAY   estradiol  (ESTRACE  VAGINAL) 0.1 MG/GM  vaginal cream Apply a pea sized amount to the inside/opening of the vagina nightly for two weeks, then two times per week   famotidine  (PEPCID ) 20 MG tablet Take 1 tablet (20 mg total) by mouth 2 (two) times daily.   gabapentin  (NEURONTIN ) 100 MG capsule TAKE ONE CAPSULE BY MOUTH THREE TIMES DAILY.   hydrocortisone  (ANUSOL -HC) 2.5 % rectal cream Place 1 Application rectally 2 (two) times daily as needed for hemorrhoids or anal itching.   Menaquinone-7 (VITAMIN K2) 100 MCG CAPS Take by mouth.   pantoprazole  (PROTONIX ) 40 MG tablet TAKE (1) TABLET BY MOUTH TWICE DAILY.   Probiotic Product (PROBIOTIC & ACIDOPHILUS EX ST PO) Take by mouth.    No facility-administered medications prior to visit.    ROS        Objective:     BP 112/68 (BP Location: Right Arm, Patient Position: Sitting, Cuff Size: Normal)   Pulse 74   Ht 5' 6 (1.676 m)   Wt 110 lb (49.9 kg)   SpO2 99%   BMI 17.75 kg/m  {Vitals History (Optional):23777}  Physical Exam   No results found for any visits on 02/17/24. {Show previous labs (optional):23779}    Assessment & Plan:    Routine Health Maintenance and Physical Exam  Immunization History  Administered Date(s) Administered   Tdap 06/25/2005    Health Maintenance  Topic Date Due   Colonoscopy  03/12/2024 (Originally 10/16/2022)   Zoster Vaccines- Shingrix (1 of 2)  05/19/2024 (Originally 11/30/1973)   INFLUENZA VACCINE  09/22/2024 (Originally 01/24/2024)   Pneumococcal Vaccine: 50+ Years (1 of 2 - PCV) 02/16/2025 (Originally 11/30/1973)   MAMMOGRAM  04/30/2024   Medicare Annual Wellness (AWV)  08/12/2024   DEXA SCAN  03/26/2025   Hepatitis C Screening  Completed   HPV VACCINES  Aged Out   Meningococcal B Vaccine  Aged Out   DTaP/Tdap/Td  Discontinued   COVID-19 Vaccine  Discontinued    Discussed health benefits of physical activity, and encouraged her to engage in regular exercise appropriate for her age and condition.     Pt declines all vaccines  today and aware of risk.   Return in about 1 year (around 02/16/2025).     Tamlyn Sides, PA-C

## 2024-02-19 ENCOUNTER — Ambulatory Visit: Payer: Self-pay | Admitting: Physician Assistant

## 2024-02-19 LAB — LIPID PANEL
Cholesterol: 260 mg/dL — ABNORMAL HIGH (ref <200)
HDL: 106 mg/dL (ref >=50)
LDL Cholesterol (Calc): 140 mg/dL — ABNORMAL HIGH
Non-HDL Cholesterol (Calc): 154 mg/dL — ABNORMAL HIGH (ref <130)
Total CHOL/HDL Ratio: 2.5 (calc) (ref <5.0)
Triglycerides: 47 mg/dL (ref <150)

## 2024-02-19 LAB — CBC WITH DIFFERENTIAL/PLATELET
Absolute Lymphocytes: 1075 {cells}/uL (ref 850–3900)
Absolute Monocytes: 292 {cells}/uL (ref 200–950)
Basophils Absolute: 52 {cells}/uL (ref 0–200)
Basophils Relative: 1.2 %
Eosinophils Absolute: 69 {cells}/uL (ref 15–500)
Eosinophils Relative: 1.6 %
HCT: 39.2 % (ref 35.0–45.0)
Hemoglobin: 13 g/dL (ref 11.7–15.5)
MCH: 31.9 pg (ref 27.0–33.0)
MCHC: 33.2 g/dL (ref 32.0–36.0)
MCV: 96.1 fL (ref 80.0–100.0)
MPV: 10.7 fL (ref 7.5–12.5)
Monocytes Relative: 6.8 %
Neutro Abs: 2812 {cells}/uL (ref 1500–7800)
Neutrophils Relative %: 65.4 %
Platelets: 320 Thousand/uL (ref 140–400)
RBC: 4.08 Million/uL (ref 3.80–5.10)
RDW: 11.9 % (ref 11.0–15.0)
Total Lymphocyte: 25 %
WBC: 4.3 Thousand/uL (ref 3.8–10.8)

## 2024-02-19 LAB — COMPREHENSIVE METABOLIC PANEL WITH GFR
AG Ratio: 2.4 (calc) (ref 1.0–2.5)
ALT: 13 U/L (ref 6–29)
AST: 20 U/L (ref 10–35)
Albumin: 4.7 g/dL (ref 3.6–5.1)
Alkaline phosphatase (APISO): 39 U/L (ref 37–153)
BUN: 15 mg/dL (ref 7–25)
CO2: 31 mmol/L (ref 20–32)
Calcium: 9.8 mg/dL (ref 8.6–10.4)
Chloride: 103 mmol/L (ref 98–110)
Creat: 0.69 mg/dL (ref 0.50–1.05)
Globulin: 2 g/dL (ref 1.9–3.7)
Glucose, Bld: 87 mg/dL (ref 65–99)
Potassium: 4.8 mmol/L (ref 3.5–5.3)
Sodium: 140 mmol/L (ref 135–146)
Total Bilirubin: 0.5 mg/dL (ref 0.2–1.2)
Total Protein: 6.7 g/dL (ref 6.1–8.1)
eGFR: 94 mL/min/1.73m2 (ref >=60)

## 2024-02-19 LAB — HEMOGLOBIN A1C
Hgb A1c MFr Bld: 5.7 % — ABNORMAL HIGH (ref <5.7)
Mean Plasma Glucose: 117 mg/dL
eAG (mmol/L): 6.5 mmol/L

## 2024-02-19 LAB — VITAMIN D 25 HYDROXY (VIT D DEFICIENCY, FRACTURES): Vit D, 25-Hydroxy: 48 ng/mL (ref 30–100)

## 2024-02-19 LAB — TSH: TSH: 0.98 m[IU]/L (ref 0.40–4.50)

## 2024-02-19 MED ORDER — LANCETS MISC. MISC: 1.0000 | Freq: Three times a day (TID) | 0 refills | Status: AC

## 2024-02-19 MED ORDER — BLOOD GLUCOSE MONITORING SUPPL DEVI: 1.0000 | Freq: Three times a day (TID) | 0 refills | Status: AC

## 2024-02-19 MED ORDER — BLOOD GLUCOSE TEST VI STRP: 1.0000 | ORAL_STRIP | Freq: Three times a day (TID) | 0 refills | Status: AC

## 2024-02-19 MED ORDER — LANCET DEVICE MISC: 1.0000 | Freq: Three times a day (TID) | 0 refills | Status: AC

## 2024-02-19 NOTE — Progress Notes (Signed)
 Kellie [name],   A1C is up some and in pre-diabetes range. Right now just need to watch sugars and carbs in diet and walk regularly(especially after meals)  Vitamin D  looks great.  Kidney, liver  look great.  Thyroid  normal range.  Hemoglobin good.    Good cholesterol looks amazing.  Bad cholesterol is stable.

## 2024-02-21 ENCOUNTER — Encounter: Payer: Self-pay | Admitting: Physician Assistant

## 2024-02-21 DIAGNOSIS — J358 Other chronic diseases of tonsils and adenoids: Secondary | ICD-10-CM | POA: Insufficient documentation

## 2024-02-25 ENCOUNTER — Encounter: Payer: Self-pay | Admitting: Sports Medicine

## 2024-03-06 NOTE — Progress Notes (Signed)
 HPI: Follow-up aortic insufficiency, coronary calcification and hyperlipidemia. ABIs May 2022 normal.  Calcium  score January 2024 23 which was 62nd percentile.  Carotid Dopplers July 2024 showed no significant stenosis.  Echocardiogram September 2024 showed normal LV function, mild aortic insufficiency.  Nuclear study September 2024 showed no ischemia or infarction.  Since last seen she occasionally feels a chest pain when she turns over in bed.  She otherwise denies dyspnea on exertion, orthopnea, PND, pedal edema, exertional chest pain or syncope.  Current Outpatient Medications  Medication Sig Dispense Refill   acyclovir  (ZOVIRAX ) 200 MG capsule Take 2 capsules (400 mg total) by mouth 2 (two) times daily. 120 capsule 3   ALPRAZolam  (XANAX ) 0.5 MG tablet Take 1 tablet by mouth daily as needed. for anxiety. 30 tablet 5   AMBULATORY NON FORMULARY MEDICATION Incentive spirometer use as directed - fax to [facility] [phone] 1 Units 99   ascorbic acid (VITAMIN C) 100 MG tablet Take 1 tablet by mouth daily.     Blood Glucose Monitoring Suppl DEVI 1 each by Does not apply route in the morning, at noon, and at bedtime. May substitute to any manufacturer covered by patient's insurance. 1 each 0   cetirizine (ZYRTEC) 10 MG tablet Take by mouth.     Cholecalciferol (VITAMIN D ) 2000 units CAPS Take by mouth.     citalopram  (CELEXA ) 10 MG tablet TAKE ONE TABLET BY MOUTH EVERY DAY 30 tablet 1   estradiol  (ESTRACE  VAGINAL) 0.1 MG/GM vaginal cream Apply a pea sized amount to the inside/opening of the vagina nightly for two weeks, then two times per week 42.5 g 11   famotidine  (PEPCID ) 20 MG tablet Take 1 tablet (20 mg total) by mouth 2 (two) times daily. 180 tablet 3   gabapentin  (NEURONTIN ) 100 MG capsule TAKE ONE CAPSULE BY MOUTH THREE TIMES DAILY. 90 capsule 1   Glucose Blood (BLOOD GLUCOSE TEST STRIPS) STRP 1 each by In Vitro route in the morning, at noon, and at bedtime. May  substitute to any manufacturer covered by patient's insurance. 100 strip 0   hydrocortisone  (ANUSOL -HC) 2.5 % rectal cream Place 1 Application rectally 2 (two) times daily as needed for hemorrhoids or anal itching. 30 g 1   Lancet Device MISC 1 each by Does not apply route in the morning, at noon, and at bedtime. May substitute to any manufacturer covered by patient's insurance. 1 each 0   Lancets Misc. MISC 1 each by Does not apply route in the morning, at noon, and at bedtime. May substitute to any manufacturer covered by patient's insurance. 100 each 0   Menaquinone-7 (VITAMIN K2) 100 MCG CAPS Take by mouth.     pantoprazole  (PROTONIX ) 40 MG tablet TAKE (1) TABLET BY MOUTH TWICE DAILY. 180 tablet 3   Probiotic Product (PROBIOTIC & ACIDOPHILUS EX ST PO) Take by mouth.      No current facility-administered medications for this visit.     Past Medical History:  Diagnosis Date   Abnormal Pap smear of cervix    Allergic rhinitis 11/08/2010   Anxiety    Asthma    Bronchitis    GERD (gastroesophageal reflux disease)    Herpes simplex of female genitalia    Hiatal hernia    Hyperlipidemia    Mitral valve prolapse    Osteoporosis 11/21/2011   Thyroid  nodule    Urticaria     Past Surgical History:  Procedure Laterality Date   CLOSED MANIPULATION SHOULDER  7&01/2005  x2, 30 days apart   CLOSED MANIPULATION SHOULDER  2006   Left   COLONOSCOPY  09/2012   Medium sized external hemorrhoids, few small divertic in left colon, otherwise normal colon (repeat 10 yrs)   ESOPHAGOGASTRODUODENOSCOPY  09/2012   Normal (Dr. Teressa)   LEEP  11/2004    Social History   Socioeconomic History   Marital status: Divorced    Spouse name: Not on file   Number of children: 1   Years of education: 9   Highest education level: 9th grade  Occupational History    Comment: Retired  Tobacco Use   Smoking status: Never   Smokeless tobacco: Never  Vaping Use   Vaping status: Never Used  Substance and  Sexual Activity   Alcohol use: No   Drug use: No   Sexual activity: Not Currently    Birth control/protection: Post-menopausal  Other Topics Concern   Not on file  Social History Narrative   Lives with her dog. Her son lives about 28 miles from her; her sister close by. She enjoys shopping, church and exercising.   Social Drivers of Health   Financial Resource Strain: Patient Declined (09/23/2023)   Received from [facility]   Overall Financial Resource Strain (CARDIA)    Difficulty of Paying Living Expenses: Patient declined  Food Insecurity: Patient Declined (09/23/2023)   Received from [facility]   Hunger Vital Sign    Within the past 12 months, you worried that your food would run out before you got the money to buy more.: Patient declined    Within the past 12 months, the food you bought just didn't last and you didn't have money to get more.: Patient declined  Transportation Needs: No Transportation Needs (09/23/2023)   Received from [facility] - Transportation    Lack of Transportation (Medical): No    Lack of Transportation (Non-Medical): No  Physical Activity: Insufficiently Active (09/23/2023)   Received from [facility]   Exercise Vital Sign    On average, how many days per week do you engage in moderate to strenuous exercise (like a brisk walk)?: 4 days    On average, how many minutes do you engage in exercise at this level?: 10 min  Stress: Stress Concern Present (09/23/2023)   Received from [facility] - Occupational Stress Questionnaire    Feeling of Stress : Rather much  Social Connections: Socially Integrated (09/23/2023)   Received from [facility]   Social Network    How would you rate your social network (family, work, friends)?: Good participation with social networks  Intimate Partner Violence: Patient Declined (09/23/2023)   Received from [facility]   HITS    Over the last 12 months how  often did your partner physically hurt you?: Patient declined    Over the last 12 months how often did your partner insult you or talk down to you?: Patient declined    Over the last 12 months how often did your partner threaten you with physical harm?: Patient declined    Over the last 12 months how often did your partner scream or curse at you?: Patient declined    Family History  Problem Relation Age of Onset   Kidney cancer Mother 29   COPD Mother    Kidney disease Mother    Allergic rhinitis Mother    Asthma Mother    Heart disease Father    Allergic rhinitis Father  COPD Sister    Depression Sister    Pancreatic cancer Sister    Hypothyroidism Sister    Hypothyroidism Sister    Stroke Sister    Heart disease Brother    Heart attack Brother    Depression Maternal Aunt    Heart disease Paternal Uncle    Depression Maternal Grandfather    Diabetes Other        neice   Thyroid  disease Other        neice   Colon cancer Neg Hx    Rectal cancer Neg Hx    Stomach cancer Neg Hx    Colon polyps Neg Hx    Esophageal cancer Neg Hx     ROS: no fevers or chills, productive cough, hemoptysis, dysphasia, odynophagia, melena, hematochezia, dysuria, hematuria, rash, seizure activity, orthopnea, PND, pedal edema, claudication. Remaining systems are negative.  Physical Exam: Well-developed well-nourished in no acute distress.  Skin is warm and dry.  HEENT is normal.  Neck is supple.  Positive bruit Chest is clear to auscultation with normal expansion.  Cardiovascular exam is regular rate and rhythm.  Abdominal exam nontender or distended. No masses palpated. Extremities show no edema. neuro grossly intact  EKG Interpretation Date/Time:  Monday March 09 2024 10:19:44 EDT Ventricular Rate:  74 PR Interval:  162 QRS Duration:  94 QT Interval:  378 QTC Calculation: 419 R Axis:   84  Text Interpretation: Normal sinus rhythm Incomplete right bundle branch block Confirmed  by Pietro Rogue ([id number]) on 03/09/2024 10:22:43 AM    A/P  1 coronary calcification-She is not having exertional chest pain and previous nuclear study showed no ischemia.  Electrocardiogram shows no ST changes.  Plan medical therapy.  2 hyperlipidemia-previous LDL and LP(a) elevated.  She previously declined lipid-lowering medications.  However she is willing to try Crestor  10 mg daily.  Check lipids and liver in 8 weeks.  3 aortic insufficiency-mild on most recent echocardiogram.  4 carotid bruit-schedule ultrasound to rule out significant disease.  Rogue Pietro, MD

## 2024-03-09 ENCOUNTER — Ambulatory Visit (INDEPENDENT_AMBULATORY_CARE_PROVIDER_SITE_OTHER): Admitting: Cardiology

## 2024-03-09 ENCOUNTER — Encounter: Payer: Self-pay | Admitting: Cardiology

## 2024-03-09 VITALS — BP 122/73 | HR 74 | Ht 66.0 in | Wt 110.0 lb

## 2024-03-09 DIAGNOSIS — I351 Nonrheumatic aortic (valve) insufficiency: Secondary | ICD-10-CM

## 2024-03-09 DIAGNOSIS — R0989 Other specified symptoms and signs involving the circulatory and respiratory systems: Secondary | ICD-10-CM | POA: Diagnosis not present

## 2024-03-09 DIAGNOSIS — E7800 Pure hypercholesterolemia, unspecified: Secondary | ICD-10-CM | POA: Diagnosis not present

## 2024-03-09 DIAGNOSIS — I2510 Atherosclerotic heart disease of native coronary artery without angina pectoris: Secondary | ICD-10-CM | POA: Diagnosis not present

## 2024-03-09 MED ORDER — ROSUVASTATIN CALCIUM 10 MG PO TABS: 10.0000 mg | ORAL_TABLET | Freq: Every day | ORAL | 3 refills | Status: AC

## 2024-03-09 NOTE — Patient Instructions (Signed)
 Medication Instructions:   START ROSUVASTATIN  10 MG ONCE DAILY  *If you need a refill on your cardiac medications before your next appointment, please call your pharmacy*  Lab Work:  Your physician recommends that you return for lab work in: [address] [facility]  If you have labs (blood work) drawn today and your tests are completely normal, you will receive your results only by: MyChart Message (if you have MyChart) OR A paper copy in the mail If you have any lab test that is abnormal or we need to change your treatment, we will call you to review the results.  Testing/Procedures:  Your physician has requested that you have a carotid duplex. This test is an ultrasound of the carotid arteries in your neck. It looks at blood flow through these arteries that supply the brain with blood. Allow one hour for this exam. There are no restrictions or special instructions. [facility]  Follow-Up: At [facility], you and your health needs are our priority.  As part of our continuing mission to provide you with exceptional heart care, our providers are all part of one team.  This team includes your primary Cardiologist (physician) and Advanced Practice Providers or APPs (Physician Assistants and Nurse Practitioners) who all work together to provide you with the care you need, when you need it.  Your next appointment:   12 month(s)  Provider:   Redell Shallow, MD

## 2024-03-11 ENCOUNTER — Ambulatory Visit: Payer: Self-pay | Admitting: Cardiology

## 2024-03-11 ENCOUNTER — Ambulatory Visit (HOSPITAL_COMMUNITY)
Admission: RE | Admit: 2024-03-11 | Discharge: 2024-03-11 | Disposition: A | Discharge status: Home / Self Care | Source: Ambulatory Visit | Attending: Cardiology | Admitting: Cardiology

## 2024-03-11 DIAGNOSIS — E785 Hyperlipidemia, unspecified: Secondary | ICD-10-CM | POA: Diagnosis not present

## 2024-03-11 DIAGNOSIS — R0989 Other specified symptoms and signs involving the circulatory and respiratory systems: Secondary | ICD-10-CM | POA: Insufficient documentation

## 2024-03-11 DIAGNOSIS — R42 Dizziness and giddiness: Secondary | ICD-10-CM | POA: Diagnosis not present

## 2024-03-11 DIAGNOSIS — I6521 Occlusion and stenosis of right carotid artery: Secondary | ICD-10-CM | POA: Diagnosis not present

## 2024-03-16 ENCOUNTER — Other Ambulatory Visit (HOSPITAL_COMMUNITY): Payer: Self-pay | Admitting: Psychiatry

## 2024-03-16 ENCOUNTER — Telehealth (HOSPITAL_COMMUNITY): Payer: Self-pay | Admitting: Psychiatry

## 2024-03-16 DIAGNOSIS — F419 Anxiety disorder, unspecified: Secondary | ICD-10-CM

## 2024-03-16 MED ORDER — CITALOPRAM HYDROBROMIDE 10 MG PO TABS: 10.0000 mg | ORAL_TABLET | Freq: Every day | ORAL | 1 refills | Status: DC

## 2024-03-16 NOTE — Telephone Encounter (Signed)
 Received fax from patient's pharmacy requesting refill of citalopram  (CELEXA ) 10 MG tablet .    [facility] [place], [place] - [address] (Ph: [phone])   Last ordered:  01/16/2024 - 30 tablets with 1 refill Last visit: 09/30/2023 Next visit: 03/30/2024

## 2024-03-16 NOTE — Telephone Encounter (Signed)
 sent

## 2024-03-30 ENCOUNTER — Telehealth (HOSPITAL_COMMUNITY): Admitting: Psychiatry

## 2024-03-30 ENCOUNTER — Encounter (HOSPITAL_COMMUNITY): Payer: Self-pay | Admitting: Psychiatry

## 2024-03-30 DIAGNOSIS — F319 Bipolar disorder, unspecified: Secondary | ICD-10-CM | POA: Diagnosis not present

## 2024-03-30 DIAGNOSIS — M5030 Other cervical disc degeneration, unspecified cervical region: Secondary | ICD-10-CM

## 2024-03-30 DIAGNOSIS — F411 Generalized anxiety disorder: Secondary | ICD-10-CM

## 2024-03-30 DIAGNOSIS — F5102 Adjustment insomnia: Secondary | ICD-10-CM

## 2024-03-30 DIAGNOSIS — F419 Anxiety disorder, unspecified: Secondary | ICD-10-CM

## 2024-03-30 DIAGNOSIS — G4700 Insomnia, unspecified: Secondary | ICD-10-CM | POA: Diagnosis not present

## 2024-03-30 DIAGNOSIS — M5412 Radiculopathy, cervical region: Secondary | ICD-10-CM

## 2024-03-30 MED ORDER — CITALOPRAM HYDROBROMIDE 10 MG PO TABS: 10.0000 mg | ORAL_TABLET | Freq: Every day | ORAL | 1 refills | Status: DC

## 2024-03-30 MED ORDER — GABAPENTIN 100 MG PO CAPS: 100.0000 mg | ORAL_CAPSULE | Freq: Two times a day (BID) | ORAL | 1 refills | Status: DC

## 2024-03-30 NOTE — Progress Notes (Signed)
 Patient ID: Kellie [name], female   DOB: December 16, 1955, 68 y.o.   MRN: [id number]   [facility] Follow up Outpatient visit  Kellie [name] [id number] 68 y.o.  03/30/2024 9:27 AM  Virtual Visit via Video Note  I connected with Kellie [name] on 03/30/24 at  9:20 AM EDT by a video enabled telemedicine application and verified that I am speaking with the correct person using two identifiers.  Location: Patient: home Provider: home office   I discussed the limitations of evaluation and management by telemedicine and the availability of in person appointments. The patient expressed understanding and agreed to proceed.     I discussed the assessment and treatment plan with the patient. The patient was provided an opportunity to ask questions and all were answered. The patient agreed with the plan and demonstrated an understanding of the instructions.   The patient was advised to call back or seek an in-person evaluation if the symptoms worsen or if the condition fails to improve as anticipated.  I provided 18 minutes of non-face-to-face time during this encounter.    Chief Complant:    History of Present Illness:  Patient is a 68 years old currently single Caucasian female.  She reports to be doing reasonably regarding her mental health has some sleep issues and reviewed sleep hygiene  Doing fair, had cervical radiculopathy so following with providers  Depression manageable with citalopram  she is also on gabapentin  and can cut down the dose to reduce fogginess he is also limiting her use of Xanax  at night for sleep to limit fogginess and risk of fall      Aggravating factor: driving, regular stressors, crowds Modifying factor: family, church Duration  8 years plus Severity manageable,  No chest pain  Past Psychiatric History/Hospitalization(s) denies  Hospitalization for psychiatric illness: No History of Electroconvulsive Shock Therapy: No Prior Suicide  Attempts: No  Medical History; Past Medical History:  Diagnosis Date   Abnormal Pap smear of cervix    Allergic rhinitis 11/08/2010   Anxiety    Asthma    Bronchitis    GERD (gastroesophageal reflux disease)    Herpes simplex of female genitalia    Hiatal hernia    Hyperlipidemia    Mitral valve prolapse    Osteoporosis 11/21/2011   Thyroid  nodule    Urticaria     Allergies: Allergies  Allergen Reactions   Codeine Nausea And Vomiting   Hydrocodone  Nausea And Vomiting   Morphine Nausea And Vomiting   Abilify  [Aripiprazole ]     Nausea/vomiting/weakness/shaky   Morphine And Codeine Nausea And Vomiting   Percocet [Oxycodone -Acetaminophen ] Nausea And Vomiting and Other (See Comments)    Patient states it makes blood pressure drop too much   Vicodin [Hydrocodone -Acetaminophen ] Nausea And Vomiting    Medications: Outpatient Encounter Medications as of 03/30/2024  Medication Sig   acyclovir  (ZOVIRAX ) 200 MG capsule Take 2 capsules (400 mg total) by mouth 2 (two) times daily.   ALPRAZolam  (XANAX ) 0.5 MG tablet Take 1 tablet by mouth daily as needed. for anxiety.   AMBULATORY NON FORMULARY MEDICATION Incentive spirometer use as directed - [facility] [phone]   ascorbic acid (VITAMIN C) 100 MG tablet Take 1 tablet by mouth daily.   Blood Glucose Monitoring Suppl DEVI 1 each by Does not apply route in the morning, at noon, and at bedtime. May substitute to any manufacturer covered by patient's insurance.   cetirizine (ZYRTEC) 10 MG tablet Take by mouth.  Cholecalciferol (VITAMIN D ) 2000 units CAPS Take by mouth.   citalopram  (CELEXA ) 10 MG tablet Take 1 tablet (10 mg total) by mouth daily.   estradiol  (ESTRACE  VAGINAL) 0.1 MG/GM vaginal cream Apply a pea sized amount to the inside/opening of the vagina nightly for two weeks, then two times per week   famotidine  (PEPCID ) 20 MG tablet Take 1 tablet (20 mg total) by mouth 2 (two) times daily.   gabapentin   (NEURONTIN ) 100 MG capsule Take 1 capsule (100 mg total) by mouth 2 (two) times daily.   hydrocortisone  (ANUSOL -HC) 2.5 % rectal cream Place 1 Application rectally 2 (two) times daily as needed for hemorrhoids or anal itching.   Menaquinone-7 (VITAMIN K2) 100 MCG CAPS Take by mouth.   pantoprazole  (PROTONIX ) 40 MG tablet TAKE (1) TABLET BY MOUTH TWICE DAILY.   Probiotic Product (PROBIOTIC & ACIDOPHILUS EX ST PO) Take by mouth.    rosuvastatin  (CRESTOR ) 10 MG tablet Take 1 tablet (10 mg total) by mouth daily.   [DISCONTINUED] citalopram  (CELEXA ) 10 MG tablet Take 1 tablet (10 mg total) by mouth daily.   [DISCONTINUED] gabapentin  (NEURONTIN ) 100 MG capsule TAKE ONE CAPSULE BY MOUTH THREE TIMES DAILY.   No facility-administered encounter medications on file as of 03/30/2024.     Family History; Family History  Problem Relation Age of Onset   Kidney cancer Mother 55   COPD Mother    Kidney disease Mother    Allergic rhinitis Mother    Asthma Mother    Heart disease Father    Allergic rhinitis Father    COPD Sister    Depression Sister    Pancreatic cancer Sister    Hypothyroidism Sister    Hypothyroidism Sister    Stroke Sister    Heart disease Brother    Heart attack Brother    Depression Maternal Aunt    Heart disease Paternal Uncle    Depression Maternal Grandfather    Diabetes Other        neice   Thyroid  disease Other        neice   Colon cancer Neg Hx    Rectal cancer Neg Hx    Stomach cancer Neg Hx    Colon polyps Neg Hx    Esophageal cancer Neg Hx        Labs:  Recent Results (from the past 2160 hours)  CBC with Differential/Platelet     Status: None   Collection Time: 02/17/24 10:05 AM  Result Value Ref Range   WBC 4.3 3.8 - 10.8 Thousand/uL   RBC 4.08 3.80 - 5.10 Million/uL   Hemoglobin 13.0 11.7 - 15.5 g/dL   HCT 60.7 64.9 - 54.9 %   MCV 96.1 80.0 - 100.0 fL   MCH 31.9 27.0 - 33.0 pg   MCHC 33.2 32.0 - 36.0 g/dL    Comment: For adults, a slight  decrease in the calculated MCHC value (in the range of 30 to 32 g/dL) is most likely not clinically significant; however, it should be interpreted with caution in correlation with other red cell parameters and the patient's clinical condition.    RDW 11.9 11.0 - 15.0 %   Platelets 320 140 - 400 Thousand/uL   MPV 10.7 7.5 - 12.5 fL   Neutro Abs 2,812 1,500 - 7,800 cells/uL   Absolute Lymphocytes 1,075 850 - 3,900 cells/uL   Absolute Monocytes 292 200 - 950 cells/uL   Eosinophils Absolute 69 15 - 500 cells/uL   Basophils Absolute 52 0 -  200 cells/uL   Neutrophils Relative % 65.4 %   Total Lymphocyte 25.0 %   Monocytes Relative 6.8 %   Eosinophils Relative 1.6 %   Basophils Relative 1.2 %  Lipid panel     Status: Abnormal   Collection Time: 02/17/24 10:05 AM  Result Value Ref Range   Cholesterol 260 (H) <200 mg/dL   HDL 893 > OR = 50 mg/dL   Triglycerides 47 <849 mg/dL   LDL Cholesterol (Calc) 140 (H) mg/dL (calc)    Comment: Reference range: <100 . Desirable range <100 mg/dL for primary prevention;   <70 mg/dL for patients with CHD or diabetic patients  with > or = 2 CHD risk factors. SABRA LDL-C is now calculated using the Martin-Hopkins  calculation, which is a validated novel method providing  better accuracy than the Friedewald equation in the  estimation of LDL-C.  Gladis APPLETHWAITE et al. SANDREA. 7986;689(80): 2061-2068  (http://education.[facility].com/faq/FAQ164)    Total CHOL/HDL Ratio 2.5 <5.0 (calc)   Non-HDL Cholesterol (Calc) 154 (H) <130 mg/dL (calc)    Comment: For patients with diabetes plus 1 major ASCVD risk  factor, treating to a non-HDL-C goal of <100 mg/dL  (LDL-C of <29 mg/dL) is considered a therapeutic  option.   TSH     Status: None   Collection Time: 02/17/24 10:05 AM  Result Value Ref Range   TSH 0.98 0.40 - 4.50 mIU/L  VITAMIN D  25 Hydroxy (Vit-D Deficiency, Fractures)     Status: None   Collection Time: 02/17/24 10:05 AM  Result Value Ref Range    Vit D, 25-Hydroxy 48 30 - 100 ng/mL    Comment: Vitamin D  Status         25-OH Vitamin D : . Deficiency:                    <20 ng/mL Insufficiency:             20 - 29 ng/mL Optimal:                 > or = 30 ng/mL . For 25-OH Vitamin D  testing on patients on  D2-supplementation and patients for whom quantitation  of D2 and D3 fractions is required, the Quest[facility]ureD(TM) 25-OH VIT D, (D2,D3), LC/MS/MS is recommended: order  code 07111 (patients >64yrs). . See Note 1 . Note 1 . For additional information, please refer to  http://education.[facility].com/faq/FAQ199  (This link is being provided for informational/ educational purposes only.)   Comprehensive Metabolic Panel (CMET)     Status: None   Collection Time: 02/17/24 10:05 AM  Result Value Ref Range   Glucose, Bld 87 65 - 99 mg/dL    Comment: .            Fasting reference interval .    BUN 15 7 - 25 mg/dL   Creat 9.30 9.49 - 8.94 mg/dL   eGFR 94 > OR = 60 fO/fpw/8.26f7   BUN/Creatinine Ratio SEE NOTE: 6 - 22 (calc)    Comment:    Not Reported: BUN and Creatinine are within    reference range. .    Sodium 140 135 - 146 mmol/L   Potassium 4.8 3.5 - 5.3 mmol/L   Chloride 103 98 - 110 mmol/L   CO2 31 20 - 32 mmol/L   Calcium  9.8 8.6 - 10.4 mg/dL   Total Protein 6.7 6.1 - 8.1 g/dL   Albumin 4.7 3.6 - 5.1 g/dL   Globulin 2.0 1.9 - 3.7 g/dL (  calc)   AG Ratio 2.4 1.0 - 2.5 (calc)   Total Bilirubin 0.5 0.2 - 1.2 mg/dL   Alkaline phosphatase (APISO) 39 37 - 153 U/L   AST 20 10 - 35 U/L   ALT 13 6 - 29 U/L  Hemoglobin A1c     Status: Abnormal   Collection Time: 02/17/24 10:05 AM  Result Value Ref Range   Hgb A1c MFr Bld 5.7 (H) <5.7 %    Comment: For someone without known diabetes, a hemoglobin  A1c value between 5.7% and 6.4% is consistent with prediabetes and should be confirmed with a  follow-up test. . For someone with known diabetes, a value <7% indicates that their diabetes is well controlled.  A1c targets should be individualized based on duration of diabetes, age, comorbid conditions, and other considerations. . This assay result is consistent with an increased risk of diabetes. . Currently, no consensus exists regarding use of hemoglobin A1c for diagnosis of diabetes for children. .    Mean Plasma Glucose 117 mg/dL   eAG (mmol/L) 6.5 mmol/L       Mental Status Examination;   Psychiatric Specialty Exam: Physical Exam  Review of Systems  Cardiovascular:  Negative for chest pain.  Psychiatric/Behavioral:  Negative for depression and suicidal ideas.     There were no vitals taken for this visit.There is no height or weight on file to calculate BMI.  General Appearance: casual  Eye Contact::  fair  Speech:  coherent  Volume:  Normal  Mood: fair  Affect:   Thought Process: clear . No psychosis  Orientation:  Full (Time, Place, and Person)  Thought Content:  Rumination  Suicidal Thoughts:  No  Homicidal Thoughts:  No  Memory:  Immediate;   Fair Recent;   Fair  Judgement:  Fair  Insight:  Shallow  Psychomotor Activity:  Increased  Concentration:  Fair  Recall:  Fair  Akathisia:  Negative  Handed:  Right  AIMS (if indicated):     Assets:  Desire for Improvement Physical Health Transportation  Sleep:        Assessment: Axis I: bipolar disorder II unspecified or depressed type. Anxiety disorder NOS. Insomnia  Axis II: deferred  Axis III:  Past Medical History:  Diagnosis Date   Abnormal Pap smear of cervix    Allergic rhinitis 11/08/2010   Anxiety    Asthma    Bronchitis    GERD (gastroesophageal reflux disease)    Herpes simplex of female genitalia    Hiatal hernia    Hyperlipidemia    Mitral valve prolapse    Osteoporosis 11/21/2011   Thyroid  nodule    Urticaria     Axis IV: psychosocial   Treatment Plan and Summary:  Prior documentation reviewed   Bipolar : Baseline continue gabapentin  small dose of milligram 2 times a day   GAD: Manageable reviewed stressors continue citalopram  provided supportive therapy  insomnia: Reviewed sleep hygiene discussed to go to bed a little bit late so have more sleep pressure work on yoga, meditation avoiding Xanax  as to avoid fogginess and risk of fall can take gabapentin  at night  Refill sent if due  FU follow-up in 6 months or earlier if needed  Jackey Flight, MD 03/30/2024

## 2024-04-08 ENCOUNTER — Telehealth: Payer: Self-pay | Admitting: Gastroenterology

## 2024-04-08 ENCOUNTER — Other Ambulatory Visit: Payer: Self-pay

## 2024-04-08 DIAGNOSIS — R1032 Left lower quadrant pain: Secondary | ICD-10-CM

## 2024-04-08 DIAGNOSIS — Z8719 Personal history of other diseases of the digestive system: Secondary | ICD-10-CM

## 2024-04-08 NOTE — Telephone Encounter (Signed)
 Patient requesting to speak with a bourse in regards to abdominal pain, scheduled for next available f/u with pa. Please advise   Thank you

## 2024-04-08 NOTE — Telephone Encounter (Addendum)
 The pt has a history of constipation and diverticulitis.  She has had some generalized abd pain and loose stools for several weeks. The diarrhea resolved but she continues to have intermittent generalized abd discomfort that has worsened some over the past day or two.  She has no bleeding, fever or other complaints.  She has an appt with our office on 11/4 set up.  Last seen here 11/24   Of note she had antibiotics on hand and has taken it for several days with no relief in symptoms  The pt will keep appt as planned with our office and has been added to the waitlist.  She has also been advised I will send to Dr Wilhelmenia for review and recommendations in the meantime

## 2024-04-08 NOTE — Telephone Encounter (Signed)
 The pt has been advised and agrees to the CT scan order entered and sent to the schedulers

## 2024-04-08 NOTE — Telephone Encounter (Signed)
 If she feels that this is similar to prior diverticulitis, then I would recommend CTAP with IV and oral contrast to be done while she is awaiting the clinic visit. Thanks. GM

## 2024-04-10 ENCOUNTER — Ambulatory Visit (HOSPITAL_BASED_OUTPATIENT_CLINIC_OR_DEPARTMENT_OTHER)

## 2024-04-10 ENCOUNTER — Ambulatory Visit (HOSPITAL_BASED_OUTPATIENT_CLINIC_OR_DEPARTMENT_OTHER)
Admission: RE | Admit: 2024-04-10 | Discharge: 2024-04-10 | Disposition: A | Discharge status: Home / Self Care | Source: Ambulatory Visit | Attending: Gastroenterology | Admitting: Gastroenterology

## 2024-04-10 ENCOUNTER — Ambulatory Visit: Payer: Self-pay | Admitting: Gastroenterology

## 2024-04-10 ENCOUNTER — Other Ambulatory Visit: Payer: Self-pay

## 2024-04-10 DIAGNOSIS — R1032 Left lower quadrant pain: Secondary | ICD-10-CM | POA: Diagnosis not present

## 2024-04-10 DIAGNOSIS — Z8719 Personal history of other diseases of the digestive system: Secondary | ICD-10-CM | POA: Insufficient documentation

## 2024-04-10 DIAGNOSIS — K429 Umbilical hernia without obstruction or gangrene: Secondary | ICD-10-CM | POA: Diagnosis not present

## 2024-04-10 DIAGNOSIS — K449 Diaphragmatic hernia without obstruction or gangrene: Secondary | ICD-10-CM | POA: Diagnosis not present

## 2024-04-10 DIAGNOSIS — R109 Unspecified abdominal pain: Secondary | ICD-10-CM | POA: Diagnosis not present

## 2024-04-10 MED ORDER — AMOXICILLIN-POT CLAVULANATE 875-125 MG PO TABS: 1.0000 | ORAL_TABLET | Freq: Two times a day (BID) | ORAL | 0 refills | Status: AC

## 2024-04-10 MED ADMIN — Iohexol Inj 300 MG/ML: 100 mL | INTRAVENOUS | NDC 00407141363

## 2024-04-15 ENCOUNTER — Other Ambulatory Visit (HOSPITAL_BASED_OUTPATIENT_CLINIC_OR_DEPARTMENT_OTHER)

## 2024-04-20 ENCOUNTER — Encounter: Payer: Self-pay | Admitting: Physician Assistant

## 2024-04-21 DIAGNOSIS — R350 Frequency of micturition: Secondary | ICD-10-CM | POA: Diagnosis not present

## 2024-04-21 DIAGNOSIS — N390 Urinary tract infection, site not specified: Secondary | ICD-10-CM | POA: Diagnosis not present

## 2024-04-25 ENCOUNTER — Telehealth: Payer: Self-pay | Admitting: Gastroenterology

## 2024-04-25 NOTE — Telephone Encounter (Signed)
 Patient called around 2 AM last night (early Sat AM). States she had been recovering from recent diverticulitis - given antibiotics, CT scan 10/17 showed diverticulitis. States she never really got better, pain never resolved. This evening having severe pain and diarrhea, on the toilet she states she passed out twice from the pain, asks what to do. Told her if she is in that much pain and never got better from prior treatment she should go to the ED for evaluation. Needs repeat imaging to ensure no complications from diverticulitis. She understood and agreed.  Of note, does not appear that she ever followed up for colonoscopy that was recommended last year.  Diann GLENWOOD LIPS - your patient

## 2024-04-28 ENCOUNTER — Ambulatory Visit: Admitting: Gastroenterology

## 2024-04-28 ENCOUNTER — Encounter: Payer: Self-pay | Admitting: Gastroenterology

## 2024-04-28 VITALS — BP 100/60 | HR 76 | Ht 66.0 in | Wt 110.0 lb

## 2024-04-28 DIAGNOSIS — R1030 Lower abdominal pain, unspecified: Secondary | ICD-10-CM | POA: Diagnosis not present

## 2024-04-28 DIAGNOSIS — R350 Frequency of micturition: Secondary | ICD-10-CM | POA: Insufficient documentation

## 2024-04-28 DIAGNOSIS — Z8719 Personal history of other diseases of the digestive system: Secondary | ICD-10-CM | POA: Diagnosis not present

## 2024-04-28 MED ORDER — NA SULFATE-K SULFATE-MG SULF 17.5-3.13-1.6 GM/177ML PO SOLN: 1.0000 | Freq: Once | ORAL | 0 refills | Status: AC

## 2024-04-28 NOTE — Patient Instructions (Signed)
 Start Miralax 1 capful daily in 8 ounces of liquid, may increase to twice daily if needed.  Referral placed for urology.   You have been scheduled for a CT scan of the abdomen and pelvis at [facility]. You are scheduled on Wednesday 05/06/24 at 9 am. You should arrive 15 minutes prior to your appointment time for registration.    Please follow the written instructions below on the day of your exam:   1) Do not eat anything after 7 am (4 hours prior to your test)   You may take any medications as prescribed with a small amount of water, if necessary. If you take any of the following medications: METFORMIN, GLUCOPHAGE, GLUCOVANCE, AVANDAMET, RIOMET, FORTAMET, ACTOPLUS MET, JANUMET, GLUMETZA or METAGLIP, you MAY be asked to HOLD this medication 48 hours AFTER the exam.   The purpose of you drinking the oral contrast is to aid in the visualization of your intestinal tract. The contrast solution may cause some diarrhea. Depending on your individual set of symptoms, you may also receive an intravenous injection of x-ray contrast/dye. Plan on being at [facility] for 45 minutes or longer, depending on the type of exam you are having performed.   If you have any questions regarding your exam or if you need to reschedule, you may call [facility] at [phone] between the hours of 8:00 am and 5:00 pm, Monday-Friday.   You have been scheduled for a colonoscopy. Please follow written instructions given to you at your visit today.   If you use inhalers (even only as needed), please bring them with you on the day of your procedure.  DO NOT TAKE 7 DAYS PRIOR TO TEST- Trulicity (dulaglutide) Ozempic, Wegovy (semaglutide) Mounjaro (tirzepatide) Bydureon Bcise (exanatide extended release)  DO NOT TAKE 1 DAY PRIOR TO YOUR TEST Rybelsus (semaglutide) Adlyxin (lixisenatide) Victoza (liraglutide) Byetta  (exanatide) ___________________________________________________________________________

## 2024-04-28 NOTE — Progress Notes (Signed)
 04/28/2024 Kellie [name] [id number] [birth year]  Discussed the use of AI scribe software for clinical note transcription with the patient, who gave verbal consent to proceed.  History of Present Illness Kellie [name] is a 68 year old female with diverticulitis who presents with ongoing gastrointestinal and urinary symptoms.  She experienced a recent flare-up of diverticulitis several weeks ago. A CT scan in October was followed by a 10-day course of Augmentin , but she continues to have significant gastrointestinal symptoms. She is concerned about a possible bowel impaction, and she was told by the CT technologist that there was an abscess and pus present when she had her CT scan, which continues to worry her despite the formal CT scan reading does not confirm any of those issues, specifically says no abscess and is uncomplicated diverticulitis.  She describes severe bladder issues, including frequent urination every 18 minutes and a sensation of pressure. These symptoms led to a visit to urgent care, where she was prescribed an additional antibiotic. Despite this, she continues to experience bladder problems. She is not currently under the care of a urologist.  She reports episodes of syncope associated with bowel movements, occurring twice around midnight last Wednesday or Thursday, believes these to be vasovagal episodes. The pain during these episodes is described as awful. Her bowel movements have been irregular, with recent stools being small and hard, and prior to that, minimal output.  No pain though with bowel movements for the past couple of days/past couple of bowel movements.  She has a history of recurrent diverticulitis flare-ups and is concerned about the potential need for surgical intervention. Additionally, she mentions a history of a sliding hernia and asks about seeing a chiropractor to help push that back into place.   CT scan of the abdomen and pelvis with contrast  03/2024: IMPRESSION: 1. Findings compatible with acute uncomplicated proximal sigmoid colon diverticulitis. No walled off abscess or loculated collection. No pneumoperitoneum. 2. Multiple other nonacute observations, as described above.   Aortic Atherosclerosis (ICD10-I70.0).   Colonoscopy 09/2012 - Medium external hemorrhoids - Diverticulosis in the left colon - Repeat 10 years  EGD 09/2012 done for GERD - Normal EGD  Cologuard 11/2022 was negative.     Past Medical History:  Diagnosis Date   Abnormal Pap smear of cervix    Allergic rhinitis 11/08/2010   Anxiety    Asthma    Bronchitis    Diverticulitis    GERD (gastroesophageal reflux disease)    Herpes simplex of female genitalia    Hiatal hernia    Hyperlipidemia    Mitral valve prolapse    Osteoporosis 11/21/2011   Thyroid  nodule    Urticaria    Past Surgical History:  Procedure Laterality Date   CLOSED MANIPULATION SHOULDER  7&01/2005   x2, 30 days apart   CLOSED MANIPULATION SHOULDER  2006   Left   COLONOSCOPY  09/2012   Medium sized external hemorrhoids, few small divertic in left colon, otherwise normal colon (repeat 10 yrs)   ESOPHAGOGASTRODUODENOSCOPY  09/2012   Normal (Dr. Teressa)   LEEP  11/2004    reports that she has never smoked. She has never used smokeless tobacco. She reports that she does not drink alcohol and does not use drugs. family history includes Allergic rhinitis in her father and mother; Asthma in her mother; COPD in her mother and sister; Depression in her maternal aunt, maternal grandfather, and sister; Diabetes in an other family member; Heart attack in her  brother; Heart disease in her brother, father, and paternal uncle; Hypothyroidism in her sister and sister; Kidney cancer (age of onset: 75) in her mother; Kidney disease in her mother; Pancreatic cancer in her sister; Stroke in her sister; Thyroid  disease in an other family member. Allergies  Allergen Reactions   Codeine Nausea And  Vomiting   Hydrocodone  Nausea And Vomiting   Morphine Nausea And Vomiting   Abilify  [Aripiprazole ]     Nausea/vomiting/weakness/shaky   Morphine And Codeine Nausea And Vomiting   Percocet [Oxycodone -Acetaminophen ] Nausea And Vomiting and Other (See Comments)    Patient states it makes blood pressure drop too much   Vicodin [Hydrocodone -Acetaminophen ] Nausea And Vomiting      Outpatient Encounter Medications as of 04/28/2024  Medication Sig   acyclovir  (ZOVIRAX ) 200 MG capsule Take 2 capsules (400 mg total) by mouth 2 (two) times daily.   ALPRAZolam  (XANAX ) 0.5 MG tablet Take 1 tablet by mouth daily as needed. for anxiety.   AMBULATORY NON FORMULARY MEDICATION Incentive spirometer use as directed - fax to [facility] [phone]   ascorbic acid (VITAMIN C) 100 MG tablet Take 1 tablet by mouth daily.   Blood Glucose Monitoring Suppl DEVI 1 each by Does not apply route in the morning, at noon, and at bedtime. May substitute to any manufacturer covered by patient's insurance.   cetirizine (ZYRTEC) 10 MG tablet Take by mouth.   Cholecalciferol (VITAMIN D ) 2000 units CAPS Take by mouth.   citalopram  (CELEXA ) 10 MG tablet Take 1 tablet (10 mg total) by mouth daily.   estradiol  (ESTRACE  VAGINAL) 0.1 MG/GM vaginal cream Apply a pea sized amount to the inside/opening of the vagina nightly for two weeks, then two times per week   famotidine  (PEPCID ) 20 MG tablet Take 1 tablet (20 mg total) by mouth 2 (two) times daily.   gabapentin  (NEURONTIN ) 100 MG capsule Take 1 capsule (100 mg total) by mouth 2 (two) times daily.   hydrocortisone  (ANUSOL -HC) 2.5 % rectal cream Place 1 Application rectally 2 (two) times daily as needed for hemorrhoids or anal itching.   MAGNESIUM  PO Take by mouth. As needed   Menaquinone-7 (VITAMIN K2) 100 MCG CAPS Take by mouth.   pantoprazole  (PROTONIX ) 40 MG tablet TAKE (1) TABLET BY MOUTH TWICE DAILY.   Probiotic Product (PROBIOTIC & ACIDOPHILUS EX ST PO)  Take by mouth.    rosuvastatin  (CRESTOR ) 10 MG tablet Take 1 tablet (10 mg total) by mouth daily.   No facility-administered encounter medications on file as of 04/28/2024.    REVIEW OF SYSTEMS  : All other systems reviewed and negative except where noted in the History of Present Illness.   PHYSICAL EXAM: BP 100/60   Pulse 76   Ht 5' 6 (1.676 m)   Wt 110 lb (49.9 kg)   BMI 17.75 kg/m  General: Well developed white female in no acute distress Head: Normocephalic and atraumatic Eyes:  Sclerae anicteric, conjunctiva pink. Ears: Normal auditory acuity Lungs: Clear throughout to auscultation; on W/R/R. Heart: Regular rate and rhythm; no M/R/G. Abdomen: Soft, non-distended.  BS present.  Really minimal to no TTP today. Rectal:  Will be done at the time of colonoscopy. Musculoskeletal: Symmetrical with no gross deformities  Skin: No lesions on visible extremities Extremities: No edema  Neurological: Alert oriented x 4, grossly non-focal Psychological:  Alert and cooperative. Normal mood and affect Assessment & Plan Diverticulitis of large intestine without perforation or abscess Recent diverticulitis flare-up treated with Augmentin  x 10 days. CT  scan in October showed uncomplicated diverticulitis without abscess or perforation. Concerns about potential abscess or impaction not supported by imaging. Symptoms include intermittent pain and vasovagal syncope during bowel movements. Potential for stricture formation due to recurrent inflammation. - Scheduled CT scan to assess resolution of diverticulitis. - Continue Miralax once daily to soften stools, increase to twice daily if needed. - Will plan for colonoscopy pending CT scan results to evaluate for stricture or other complications.  Will actually go ahead and schedule for a few weeks out with Dr. Wilhelmenia. - Will consider surgical consultation for recurrent diverticulitis.  Constipation Chronic constipation with recent episodes of  hard stools and vasovagal syncope during bowel movements. Recent CT scan did not show significant stool impaction. Miralax recommended to soften stools and prevent impaction. - Continue Miralax once daily, increase to twice daily if needed.  Bladder dysfunction, unspecified Bladder dysfunction with frequent urination and pressure. Previous urgent care visit resulted in antibiotic treatment. No current urology follow-up. - Referred to urology for further evaluation of bladder dysfunction per patient request.  Hiatal hernia Previous diagnosis of sliding hernia. Most recent CT scan shows a tine sliding hiatal hernia.  Asks about chiropractic intervention to reposition hiatal hernia alleviate symptoms, though not medically validated.  Advised that I was unsure of anything about this treatment.   CC:  Breeback, Jade L, PA-C

## 2024-04-29 NOTE — Telephone Encounter (Signed)
 The pt was seen on 11/4 and nothing further needed.

## 2024-04-29 NOTE — Progress Notes (Signed)
 Attending Physician's Attestation   I have reviewed the chart.   I agree with the Advanced Practitioner's note, impression, and recommendations with any updates as below. Reasonable with ongoing symptoms to ensure no complicated diverticulitis has occurred and then proceed with endoscopic evaluation as outlined.   Aloha Finner, MD [facility] Advanced Endoscopy Office # [phone]

## 2024-05-01 ENCOUNTER — Encounter: Payer: Self-pay | Admitting: Physician Assistant

## 2024-05-04 ENCOUNTER — Ambulatory Visit: Admitting: Physician Assistant

## 2024-05-04 ENCOUNTER — Other Ambulatory Visit

## 2024-05-04 ENCOUNTER — Encounter: Payer: Self-pay | Admitting: Physician Assistant

## 2024-05-04 ENCOUNTER — Ambulatory Visit: Admitting: Cardiology

## 2024-05-04 VITALS — BP 117/68 | HR 68 | Ht 66.0 in | Wt 111.0 lb

## 2024-05-04 DIAGNOSIS — R350 Frequency of micturition: Secondary | ICD-10-CM

## 2024-05-04 DIAGNOSIS — R3989 Other symptoms and signs involving the genitourinary system: Secondary | ICD-10-CM | POA: Insufficient documentation

## 2024-05-04 DIAGNOSIS — G8929 Other chronic pain: Secondary | ICD-10-CM | POA: Insufficient documentation

## 2024-05-04 DIAGNOSIS — M542 Cervicalgia: Secondary | ICD-10-CM

## 2024-05-04 DIAGNOSIS — K449 Diaphragmatic hernia without obstruction or gangrene: Secondary | ICD-10-CM | POA: Diagnosis not present

## 2024-05-04 DIAGNOSIS — K429 Umbilical hernia without obstruction or gangrene: Secondary | ICD-10-CM | POA: Insufficient documentation

## 2024-05-04 DIAGNOSIS — R300 Dysuria: Secondary | ICD-10-CM | POA: Diagnosis not present

## 2024-05-04 DIAGNOSIS — K760 Fatty (change of) liver, not elsewhere classified: Secondary | ICD-10-CM | POA: Diagnosis not present

## 2024-05-04 DIAGNOSIS — R1084 Generalized abdominal pain: Secondary | ICD-10-CM

## 2024-05-04 DIAGNOSIS — E041 Nontoxic single thyroid nodule: Secondary | ICD-10-CM

## 2024-05-04 DIAGNOSIS — M5030 Other cervical disc degeneration, unspecified cervical region: Secondary | ICD-10-CM

## 2024-05-04 DIAGNOSIS — Z8719 Personal history of other diseases of the digestive system: Secondary | ICD-10-CM

## 2024-05-04 DIAGNOSIS — N9489 Other specified conditions associated with female genital organs and menstrual cycle: Secondary | ICD-10-CM

## 2024-05-04 DIAGNOSIS — Z87440 Personal history of urinary (tract) infections: Secondary | ICD-10-CM | POA: Insufficient documentation

## 2024-05-04 NOTE — Progress Notes (Unsigned)
   Established Patient Office Visit  Subjective   Patient ID: Kellie [name], female    DOB: 1956/04/13  Age: 68 y.o. MRN: [id number]  No chief complaint on file.   HPI   ROS    Objective:     There were no vitals taken for this visit. BP Readings from Last 3 Encounters:  05/04/24 117/68  04/28/24 100/60  03/09/24 122/73   Wt Readings from Last 3 Encounters:  05/04/24 111 lb (50.3 kg)  04/28/24 110 lb (49.9 kg)  03/09/24 110 lb (49.9 kg)      Physical Exam     Assessment & Plan:     No follow-ups on file.    Ben Habermann, PA-C

## 2024-05-04 NOTE — Patient Instructions (Signed)
 Dr. Carolee urology referral to call and make appt Thyroid  U/S ordered to follow up on left thyroid  nodules Keep follow ups with GI to manage diverticulitis and need for colonoscopy MRI to follow up on chronic neck pain and cervical DDD

## 2024-05-06 ENCOUNTER — Other Ambulatory Visit

## 2024-05-06 ENCOUNTER — Encounter: Payer: Self-pay | Admitting: Physician Assistant

## 2024-05-06 DIAGNOSIS — K760 Fatty (change of) liver, not elsewhere classified: Secondary | ICD-10-CM | POA: Insufficient documentation

## 2024-05-06 DIAGNOSIS — E041 Nontoxic single thyroid nodule: Secondary | ICD-10-CM

## 2024-05-06 DIAGNOSIS — N9489 Other specified conditions associated with female genital organs and menstrual cycle: Secondary | ICD-10-CM | POA: Insufficient documentation

## 2024-05-06 DIAGNOSIS — R1084 Generalized abdominal pain: Secondary | ICD-10-CM | POA: Insufficient documentation

## 2024-05-06 LAB — POCT URINALYSIS DIP (CLINITEK)
Bilirubin, UA: NEGATIVE
Blood, UA: NEGATIVE
Glucose, UA: NEGATIVE mg/dL
Ketones, POC UA: NEGATIVE mg/dL
Leukocytes, UA: NEGATIVE
Nitrite, UA: NEGATIVE
POC PROTEIN,UA: NEGATIVE
Spec Grav, UA: 1.025 (ref 1.010–1.025)
Urobilinogen, UA: 0.2 U/dL
pH, UA: 6 (ref 5.0–8.0)

## 2024-05-07 ENCOUNTER — Ambulatory Visit: Payer: Self-pay | Admitting: Physician Assistant

## 2024-05-07 DIAGNOSIS — R634 Abnormal weight loss: Secondary | ICD-10-CM

## 2024-05-07 DIAGNOSIS — E559 Vitamin D deficiency, unspecified: Secondary | ICD-10-CM

## 2024-05-07 DIAGNOSIS — R7303 Prediabetes: Secondary | ICD-10-CM

## 2024-05-07 DIAGNOSIS — R399 Unspecified symptoms and signs involving the genitourinary system: Secondary | ICD-10-CM | POA: Diagnosis not present

## 2024-05-07 DIAGNOSIS — K760 Fatty (change of) liver, not elsewhere classified: Secondary | ICD-10-CM

## 2024-05-07 DIAGNOSIS — Z8719 Personal history of other diseases of the digestive system: Secondary | ICD-10-CM

## 2024-05-07 DIAGNOSIS — R350 Frequency of micturition: Secondary | ICD-10-CM | POA: Diagnosis not present

## 2024-05-07 DIAGNOSIS — E782 Mixed hyperlipidemia: Secondary | ICD-10-CM

## 2024-05-07 LAB — URINE CULTURE

## 2024-05-07 NOTE — Progress Notes (Signed)
 No bacteria found in urine.

## 2024-05-08 NOTE — Progress Notes (Signed)
 Kellie [name],   Calcium  levels good. I do not think parathyroid hormone. Suspect left inferior thyroid  nodule is benign and has decreased in size since last ultrasound.

## 2024-05-10 ENCOUNTER — Other Ambulatory Visit

## 2024-05-11 ENCOUNTER — Encounter: Payer: Self-pay | Admitting: Physician Assistant

## 2024-05-11 ENCOUNTER — Ambulatory Visit (INDEPENDENT_AMBULATORY_CARE_PROVIDER_SITE_OTHER)

## 2024-05-11 DIAGNOSIS — Z8719 Personal history of other diseases of the digestive system: Secondary | ICD-10-CM | POA: Diagnosis not present

## 2024-05-11 DIAGNOSIS — R1030 Lower abdominal pain, unspecified: Secondary | ICD-10-CM | POA: Diagnosis not present

## 2024-05-11 DIAGNOSIS — K5730 Diverticulosis of large intestine without perforation or abscess without bleeding: Secondary | ICD-10-CM | POA: Diagnosis not present

## 2024-05-11 DIAGNOSIS — N2889 Other specified disorders of kidney and ureter: Secondary | ICD-10-CM | POA: Diagnosis not present

## 2024-05-11 DIAGNOSIS — R935 Abnormal findings on diagnostic imaging of other abdominal regions, including retroperitoneum: Secondary | ICD-10-CM | POA: Diagnosis not present

## 2024-05-11 MED ORDER — IOHEXOL 9 MG/ML PO SOLN
500.0000 mL | ORAL | Status: AC
  Administered 2024-05-11 (×2): 500 mL via ORAL

## 2024-05-11 MED ORDER — IOHEXOL 300 MG/ML  SOLN
100.0000 mL | Freq: Once | INTRAMUSCULAR | Status: AC | PRN
  Administered 2024-05-11: 100 mL via INTRAVENOUS

## 2024-05-12 MED ORDER — OSELTAMIVIR PHOSPHATE 75 MG PO CAPS: 75.0000 mg | ORAL_CAPSULE | Freq: Every day | ORAL | 0 refills | Status: AC

## 2024-05-13 DIAGNOSIS — R634 Abnormal weight loss: Secondary | ICD-10-CM | POA: Insufficient documentation

## 2024-05-13 NOTE — Addendum Note (Signed)
 Addended by: ANTONIETTE VERMELL CROME on: 05/13/2024 05:04 PM   Modules accepted: Orders

## 2024-05-14 ENCOUNTER — Ambulatory Visit: Payer: Self-pay | Admitting: Gastroenterology

## 2024-05-16 ENCOUNTER — Encounter: Payer: Self-pay | Admitting: Physician Assistant

## 2024-05-18 ENCOUNTER — Ambulatory Visit

## 2024-05-18 DIAGNOSIS — M542 Cervicalgia: Secondary | ICD-10-CM

## 2024-05-18 DIAGNOSIS — G8929 Other chronic pain: Secondary | ICD-10-CM | POA: Diagnosis not present

## 2024-05-18 DIAGNOSIS — M5030 Other cervical disc degeneration, unspecified cervical region: Secondary | ICD-10-CM

## 2024-05-18 NOTE — Telephone Encounter (Signed)
 She doesn' tneed any additional kidney workup. She has an anatomical variant. It is not worrisome.

## 2024-05-25 ENCOUNTER — Encounter: Payer: Self-pay | Admitting: Physician Assistant

## 2024-05-25 NOTE — Progress Notes (Signed)
 No significant changes since march but you do have known degenerative disc disease.   What about Dr. Otoole in [place] to consider injections in neck?

## 2024-05-27 ENCOUNTER — Ambulatory Visit: Admitting: Obstetrics and Gynecology

## 2024-05-27 ENCOUNTER — Encounter: Payer: Self-pay | Admitting: Obstetrics and Gynecology

## 2024-05-27 VITALS — BP 104/68 | HR 85 | Ht 66.0 in | Wt 108.0 lb

## 2024-05-27 DIAGNOSIS — K5900 Constipation, unspecified: Secondary | ICD-10-CM

## 2024-05-27 DIAGNOSIS — M6289 Other specified disorders of muscle: Secondary | ICD-10-CM | POA: Diagnosis not present

## 2024-05-27 DIAGNOSIS — Z1331 Encounter for screening for depression: Secondary | ICD-10-CM

## 2024-05-27 NOTE — Patient Instructions (Signed)
 Dulcolax suppository Sennakot - laxative

## 2024-05-27 NOTE — Progress Notes (Signed)
 ANNUAL EXAM Patient name: Kellie [name] MRN [id number]  Date of birth: [birth year] Chief Complaint:   Gynecologic Exam (Pelvic area pressure for several weeks and has seen urology and has also been cleared for Diverticulitis flare up . Would like to be examined )  History of Present Illness:   Kellie [name] is a 68 y.o. G1P1001 with No LMP recorded. Patient is postmenopausal. being seen today for a routine annual exam.  Current complaints:   - Bladder pressure, pelvic pressure & constipation - recent episode of diverticulitis. Is cleared from that standpoint but is now dealing with constipation despite drinking 70oz water a day and taking several doses of miralax. She is planned for a colonoscopy in January and regularly follows with GI. She does have pain with pelvic exams and pelvic ultrasounds. Is seeing urology on Friday. - Ovarian cyst - had a biloculated 1.5cm cyst on the right ovary on US  5/ & 02/2023. 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 Doubtful that this is the etiology of any of her symptoms  I personally reviewed the following since her last visit: - CT A/P 05/11/24 - CT A/P 04/10/24  Last pap 12/12/22. Results were: NILM w/ HRHPV negative. H/O abnormal pap: no Last mammogram: 05/01/23. Results were: normal.  Last colonoscopy: 10/15/12. Results were: normal DEXA: 2024 c/w osteoporosis     05/27/2024    9:48 AM 02/21/2024   12:49 PM 02/17/2024   10:09 AM 08/13/2023    3:12 PM 08/03/2022    8:53 AM  Depression screen PHQ 2/9  Decreased Interest 1 0 1 0 0  Down, Depressed, Hopeless 0 0 0 0 0  PHQ - 2 Score 1 0 1 0 0  Altered sleeping 3 0 2    Tired, decreased energy 1 0 1    Change  in appetite 0 0 0    Feeling bad or failure about yourself  0 0 1    Trouble concentrating 0 0 3    Moving slowly or fidgety/restless 0 0 0    Suicidal thoughts 0 0 0    PHQ-9 Score 5 0  8     Difficult doing work/chores  Not difficult at all Not difficult at all       Data saved with a previous flowsheet row definition        05/27/2024    9:47 AM 02/17/2024   10:10 AM 03/19/2022   10:55 AM 02/15/2021   10:14 AM  GAD 7 : Generalized Anxiety Score  Nervous, Anxious, on Edge 1 1 2 1   Control/stop worrying 0 1 2 0  Worry too much - different things 1 1 2 1   Trouble relaxing 1 1 2 1   Restless 1 0 1 0  Easily annoyed or irritable 1 1 3 1   Afraid - awful might happen 0 0 2 1  Total GAD 7 Score 5 5 14 5   Anxiety Difficulty  Not difficult at all Not difficult at all Not difficult at all     Review of Systems:   Pertinent items are noted in HPI Denies any headaches, blurred vision, fatigue, shortness of breath, chest pain, abdominal pain, abnormal vaginal discharge/itching/odor/irritation, problems with periods,  bowel movements, urination, or intercourse unless otherwise stated above. Pertinent History Reviewed:  Reviewed past medical,surgical, social and family history.  Reviewed problem list, medications and allergies. Physical Assessment:   Vitals:   05/27/24 0950  BP: 104/68  Pulse: 85  Weight: 108 lb (49 kg)  Height: 5' 6 (1.676 m)  Body mass index is 17.43 kg/m.        Physical Examination:   General appearance - well appearing, and in no distress  Mental status - alert, oriented to person, place, and time  Chest - respiratory effort normal  Heart - normal peripheral perfusion  Breasts - breasts appear normal, no suspicious masses, no skin or nipple changes or axillary nodes  Abdomen - soft, nontender, nondistended, no masses or organomegaly  Pelvic - VULVA: normal appearing vulva with normal menopausal changes, no masses, tenderness or lesions  VAGINA: normal  appearing vagina normal menopausal changes, no discharge/lesions. +levator & obturator tenderness, not able to coordinate for valsalva/kegel CERVIX: no mass or tenderness  UTERUS: uterus small, mobile, no masses or tenderness  ADNEXA: No adnexal masses or tenderness noted.  Chaperone present for exam  Assessment & Plan:  1) Well-Woman Exam Mammogram: recommend MMG this year but she declines Colonoscopy: scheduled 06/2024 Pap: Due 2029 DEXA: up to date  2) Pelvic pressure, bladder pressure, constipation - No e/o uterine or adnexal tenderness to suggest primary GYN etiology. We reviewed the stability of her gyn imaging over the past few years - Based on exam today, pelvic floor dysfunction is likely contributing to her symptoms. PFPT referral placed to provide symptomatic relief while pursuing further work up  - Strongly recommend colonoscopy as scheduled; continue GI & urology follow up  Orders Placed This Encounter  Procedures   Ambulatory referral to Physical Therapy   Follow-up: Return if symptoms worsen or fail to improve.  Kieth JAYSON Carolin, MD 05/27/2024 11:28 AM

## 2024-06-01 DIAGNOSIS — R350 Frequency of micturition: Secondary | ICD-10-CM | POA: Diagnosis not present

## 2024-06-02 ENCOUNTER — Encounter: Payer: Self-pay | Admitting: *Deleted

## 2024-06-02 ENCOUNTER — Other Ambulatory Visit: Payer: Self-pay | Admitting: *Deleted

## 2024-06-02 DIAGNOSIS — E7800 Pure hypercholesterolemia, unspecified: Secondary | ICD-10-CM

## 2024-06-04 ENCOUNTER — Ambulatory Visit: Admitting: Obstetrics and Gynecology

## 2024-06-23 ENCOUNTER — Encounter: Admitting: Gastroenterology

## 2024-06-24 ENCOUNTER — Encounter: Payer: Self-pay | Admitting: Physician Assistant

## 2024-06-24 MED ORDER — ALBUTEROL SULFATE (2.5 MG/3ML) 0.083% IN NEBU: 2.5000 mg | INHALATION_SOLUTION | Freq: Four times a day (QID) | RESPIRATORY_TRACT | 1 refills | Status: AC | PRN

## 2024-06-30 ENCOUNTER — Ambulatory Visit: Payer: Self-pay | Admitting: Cardiology

## 2024-06-30 ENCOUNTER — Encounter: Payer: Self-pay | Admitting: Cardiology

## 2024-06-30 LAB — LIPID PANEL
Cholesterol: 241 mg/dL — ABNORMAL HIGH
HDL: 103 mg/dL
LDL Cholesterol (Calc): 123 mg/dL — ABNORMAL HIGH
Non-HDL Cholesterol (Calc): 138 mg/dL — ABNORMAL HIGH
Total CHOL/HDL Ratio: 2.3 (calc)
Triglycerides: 56 mg/dL

## 2024-06-30 LAB — HEPATIC FUNCTION PANEL
AG Ratio: 2.6 (calc) — ABNORMAL HIGH (ref 1.0–2.5)
ALT: 12 U/L (ref 6–29)
AST: 20 U/L (ref 10–35)
Albumin: 4.9 g/dL (ref 3.6–5.1)
Alkaline phosphatase (APISO): 48 U/L (ref 37–153)
Bilirubin, Direct: 0.1 mg/dL (ref 0.0–0.2)
Globulin: 1.9 g/dL (ref 1.9–3.7)
Indirect Bilirubin: 0.4 mg/dL (ref 0.2–1.2)
Total Bilirubin: 0.5 mg/dL (ref 0.2–1.2)
Total Protein: 6.8 g/dL (ref 6.1–8.1)

## 2024-07-06 ENCOUNTER — Encounter: Payer: Self-pay | Admitting: Cardiology

## 2024-07-09 ENCOUNTER — Ambulatory Visit: Admitting: Pulmonary Disease

## 2024-07-10 ENCOUNTER — Encounter: Payer: Self-pay | Admitting: Gastroenterology

## 2024-07-17 ENCOUNTER — Encounter: Admitting: Gastroenterology

## 2024-07-21 ENCOUNTER — Other Ambulatory Visit (HOSPITAL_COMMUNITY): Payer: Self-pay | Admitting: Psychiatry

## 2024-07-21 DIAGNOSIS — F419 Anxiety disorder, unspecified: Secondary | ICD-10-CM

## 2024-07-21 DIAGNOSIS — M5030 Other cervical disc degeneration, unspecified cervical region: Secondary | ICD-10-CM

## 2024-07-21 DIAGNOSIS — M5412 Radiculopathy, cervical region: Secondary | ICD-10-CM

## 2024-07-30 ENCOUNTER — Ambulatory Visit: Admitting: Physical Therapy

## 2024-08-04 ENCOUNTER — Ambulatory Visit: Admitting: Pulmonary Disease

## 2024-08-05 ENCOUNTER — Encounter: Admitting: Gastroenterology

## 2024-08-18 ENCOUNTER — Ambulatory Visit

## 2024-08-25 ENCOUNTER — Encounter

## 2024-09-08 ENCOUNTER — Encounter: Admitting: Gastroenterology

## 2024-09-23 ENCOUNTER — Ambulatory Visit: Admitting: Physical Therapy

## 2024-09-28 ENCOUNTER — Telehealth (HOSPITAL_COMMUNITY): Admitting: Psychiatry
